# Patient Record
Sex: Female | Born: 1950
Health system: Southern US, Community
[De-identification: ages and names within clinical notes are randomized; demographics above are authoritative.]

## PROBLEM LIST (undated history)

## (undated) DIAGNOSIS — K219 Gastro-esophageal reflux disease without esophagitis: Secondary | ICD-10-CM

## (undated) DIAGNOSIS — R0600 Dyspnea, unspecified: Secondary | ICD-10-CM

## (undated) DIAGNOSIS — I499 Cardiac arrhythmia, unspecified: Secondary | ICD-10-CM

## (undated) DIAGNOSIS — I4891 Unspecified atrial fibrillation: Secondary | ICD-10-CM

## (undated) DIAGNOSIS — R609 Edema, unspecified: Secondary | ICD-10-CM

## (undated) DIAGNOSIS — I82409 Acute embolism and thrombosis of unspecified deep veins of unspecified lower extremity: Secondary | ICD-10-CM

## (undated) DIAGNOSIS — I10 Essential (primary) hypertension: Secondary | ICD-10-CM

## (undated) DIAGNOSIS — E039 Hypothyroidism, unspecified: Secondary | ICD-10-CM

## (undated) DIAGNOSIS — E785 Hyperlipidemia, unspecified: Secondary | ICD-10-CM

## (undated) DIAGNOSIS — M1990 Unspecified osteoarthritis, unspecified site: Secondary | ICD-10-CM

## (undated) DIAGNOSIS — I8390 Asymptomatic varicose veins of unspecified lower extremity: Secondary | ICD-10-CM

## (undated) HISTORY — PX: TONSILLECTOMY: SUR1361

## (undated) HISTORY — DX: Hypothyroidism, unspecified: E03.9

## (undated) HISTORY — PX: CYSTOCELE REPAIR: SHX163

## (undated) HISTORY — DX: Acute embolism and thrombosis of unspecified deep veins of unspecified lower extremity: I82.409

## (undated) HISTORY — PX: HERNIA REPAIR: SHX51

## (undated) HISTORY — PX: VARICOSE VEIN SURGERY: SHX832

## (undated) HISTORY — PX: CHOLECYSTECTOMY: SHX55

## (undated) HISTORY — DX: Asymptomatic varicose veins of unspecified lower extremity: I83.90

## (undated) HISTORY — DX: Essential (primary) hypertension: I10

## (undated) HISTORY — DX: Hyperlipidemia, unspecified: E78.5

## (undated) HISTORY — DX: Edema, unspecified: R60.9

---

## 1976-05-15 HISTORY — PX: APPENDECTOMY: SHX54

## 2010-12-07 NOTE — Progress Notes (Signed)
[facility] [place]  Referred by: Juleen China, MD  Reason for referral: Swollen L leg  History of Present Illness  Candice Perez is a 60 y.o. female who presents with chief complaint: swollen leg.  Patient notes, onset of swelling 12 months ago, associated with pain.  The swelling increases as the day continues.  She has Left > right pain the calf.  The swelling improves with compression stockings and elevating her leg.  She has to stand for her occupation.  The patient has had no history of DVT,  history of varicose vein, no history of venous stasis ulcers, no history of  Lymphedema and history of skin changes in lower legs.  Her father had history of varicose veins.  The patient has used compression stockings in the past.  The patient has had three children.    Past Medical History  Diagnosis Date  . Hypothyroidism   . Edema   . Hyperlipidemia   . Hypertension     no meds now    Past Surgical History  Procedure Date  . Tonsillectomy   . Cystocele repair   . Cholecystectomy     Laparoscopic    History   Social History  . Marital Status: Married    Spouse Name: N/A    Number of Children: N/A  . Years of Education: N/A   Occupational History  . Not on file.   Social History Main Topics  . Smoking status: Former Smoker -- 40 years    Types: Cigarettes    Quit date: 09/17/2010  . Smokeless tobacco: Not on file  . Alcohol Use: No  . Drug Use: No  . Sexually Active:    Other Topics Concern  . Not on file   Social History Narrative  . No narrative on file    Family History  Problem Relation Age of Onset  . Cancer Father   . Other Father     Varicose veins, Arteriosclerosis    Current outpatient prescriptions:Multiple Vitamin (MULTIVITAMIN) tablet, Take 1 tablet by mouth daily.  , Disp: , Rfl: ;  amoxicillin (AMOXIL) 500 MG capsule, , Disp: , Rfl: ;  buPROPion (WELLBUTRIN XL) 150 MG 24 hr tablet, , Disp: , Rfl: ;  CHERATUSSIN AC 100-10  MG/5ML syrup, , Disp: , Rfl: ;  clarithromycin (BIAXIN XL) 500 MG 24 hr tablet, , Disp: , Rfl: ;  clarithromycin (BIAXIN) 500 MG tablet, , Disp: , Rfl:  ibuprofen (ADVIL,MOTRIN) 600 MG tablet, , Disp: , Rfl: ;  levofloxacin (LEVAQUIN) 750 MG tablet, , Disp: , Rfl: ;  levothyroxine (SYNTHROID, LEVOTHROID) 75 MCG tablet, Take 75 mcg by mouth daily. , Disp: , Rfl: ;  omeprazole (PRILOSEC) 20 MG capsule, , Disp: , Rfl: ;  oxyCODONE (OXY IR/ROXICODONE) 5 MG immediate release tablet, , Disp: , Rfl: ;  predniSONE (DELTASONE) 20 MG tablet, , Disp: , Rfl:  PROAIR HFA 108 (90 BASE) MCG/ACT inhaler, , Disp: , Rfl:   Allergies as of 12/09/2010  . (No Known Allergies)   Review of Systems (Positive items in bold and italic, otherwise negative)  General: Weight loss, Weight gain, Loss of appetite, Fever  Neurologic: Dizziness, Blackouts, Headaches, Seizure  Ear/Nose/Throat: Change in eyesight, Change in hearing, Nose bleeds, Sore throat  Vascular: Pain in legs with walking, Pain in feet while lying flat, Non-healing ulcer, Stroke, "Mini stroke", Slurred speech, Temporary blindness, Blood clot in vein, Phlebitis  Pulmonary: Home oxygen, Productive cough, Bronchitis, Coughing up blood, Asthma, Wheezing  Musculoskeletal: Arthritis, Joint pain, Muscle pain  Cardiac: Chest pain, Chest tightness/pressure, Shortness of breath when lying flat, Palpitations, Heart murmur, Arrythmia, Atrial fibrillation  Hematologic: Bleeding problems, Clotting disorder, Anemia  Psychiatric:  Depression, Anxiety, Attention deficit disorder  Gastrointestinal:  Black stool, Blood in stool, Peptic ulcer disease, Reflux, Hiatal hernia, Trouble swallowing, Diarrhea, Constipation  Urinary:  Kidney disease, Burning with urination, Frequent urination, Difficulty urinating  Skin: Ulcers, Rashes  Physical Examination  Filed Vitals:   12/09/10 1542  BP: 184/80  Pulse: 56   General: A&O x 3, WDWN, obese  Head:  [place]/AT  Ear/Nose/Throat: Hearing grossly intact, nares w/o erythema or drainage, oropharynx w/o Erythema/Exudate  Eyes: PERRLA, EOMI  Neck: Supple, no nuchal rigidity, no palpable LAD  Pulmonary: Sym exp, good air movt, CTAB, no rales, rhonchi, & wheezing  Cardiac: RRR, Nl S1, S2, no Murmurs, rubs or gallops  Vascular: Vessel Right Left  Radial Palpable Palpable  [place] Palpable Palpable  Brachial Palpable Palpable  Carotid Palpable, without bruit Palpable, without bruit  Aorta Non-palpable N/A  Femoral Palpable Palpable  Popliteal Non-palpable Non-palpable  PT Palpable Palpable  DP Palpable Palpable   Gastrointestinal: soft, NTND, -G/R, - HSM, - masses, - CVAT B, obese  Musculoskeletal: M/S 5/5 throughout, Extremities without ischemic changes, Bilateral edema 1+ (L>R), spider veins B (R>L), lipodermatosclerosis B (R>L)   Neurologic: CN 2-12 intact, Pain and light touch intact in extremities, Motor exam as listed above  Psychiatric: Judgment intact, Mood & affect appropriate for pt's clinical situation  Dermatologic: See M/S exam for extremity exam, no rashes otherwise noted  Lymph : No Cervical, Axillary, or Inguinal lymphadenopathy   Non-Invasive Vascular Imaging   BLE Venous Insufficiency Duplex ordered  Outside Studies/Documentation 15 pages of outside documents were reviewed including: venous duplex report.  Medical Decision Making  Candice Perez is a 60 y.o. female who presents with: BLE chronic venous insufficiency.   Based on the patient's history and examination, I recommend: BLE venous insufficiency duplex, which I will arrange for, and continue compression stocking use.  By report, she has been told to wear compression stockings since May 2012.  I suspect she will have evidence of venous reflux consistent with C4 CVI, hence, she may potentially benefit from EVLA of the GSV.  Once her studies are completed, she will be referred to the [facility] for  evaluation for intervention.Thank you for allowing [place] to participate in this patient's care.  Leonides Sake, MD [facility] [place] Office: [phone] Pager: [phone]

## 2010-12-09 ENCOUNTER — Encounter: Payer: Self-pay | Admitting: Vascular Surgery

## 2010-12-09 ENCOUNTER — Ambulatory Visit (INDEPENDENT_AMBULATORY_CARE_PROVIDER_SITE_OTHER): Payer: BC Managed Care – PPO | Admitting: Vascular Surgery

## 2010-12-09 VITALS — BP 184/80 | HR 56 | Ht 67.0 in | Wt 218.0 lb

## 2010-12-09 DIAGNOSIS — I872 Venous insufficiency (chronic) (peripheral): Secondary | ICD-10-CM | POA: Insufficient documentation

## 2010-12-09 NOTE — Progress Notes (Signed)
BLE edema      L>R   No injury    Ref--> Dr. Juleen China

## 2011-01-05 ENCOUNTER — Encounter: Payer: Self-pay | Admitting: Vascular Surgery

## 2011-01-10 ENCOUNTER — Encounter: Payer: Self-pay | Admitting: Vascular Surgery

## 2011-01-27 ENCOUNTER — Encounter: Payer: Self-pay | Admitting: Vascular Surgery

## 2011-01-30 ENCOUNTER — Encounter: Payer: Self-pay | Admitting: Vascular Surgery

## 2011-01-30 ENCOUNTER — Ambulatory Visit (INDEPENDENT_AMBULATORY_CARE_PROVIDER_SITE_OTHER): Payer: BC Managed Care – PPO | Admitting: Vascular Surgery

## 2011-01-30 ENCOUNTER — Other Ambulatory Visit (INDEPENDENT_AMBULATORY_CARE_PROVIDER_SITE_OTHER): Payer: BC Managed Care – PPO | Admitting: *Deleted

## 2011-01-30 VITALS — BP 185/78 | HR 60 | Resp 20 | Ht 67.0 in | Wt 290.0 lb

## 2011-01-30 DIAGNOSIS — I87309 Chronic venous hypertension (idiopathic) without complications of unspecified lower extremity: Secondary | ICD-10-CM

## 2011-01-30 DIAGNOSIS — I83893 Varicose veins of bilateral lower extremities with other complications: Secondary | ICD-10-CM

## 2011-01-30 NOTE — Progress Notes (Signed)
Subjective:     Patient ID: Candice Perez, female   DOB: [birth year], 60 y.o.   MRN: [id number]  HPI this 60 year old female returns for followup of her venous insufficiency of the left leg she was evaluated by Dr. Imogene Burn in July of this year. She has been wearing short-leg elastic compression stockings for 5 months with no improvement in her symptomatology. The swelling has been present in the left leg for 1 year no history of DVT superficial thrombophlebitis stasis ulcers or bleeding. She returns today continuing to have increasing symptoms of swelling in the left leg as the day progresses. She is unable to elevate her leg during the day because of her occupation. Past Medical History  Diagnosis Date  . Hypothyroidism   . Edema   . Hyperlipidemia   . Hypertension     no meds now    History  Substance Use Topics  . Smoking status: Former Smoker -- 40 years    Types: Cigarettes    Quit date: 09/17/2010  . Smokeless tobacco: Never Used  . Alcohol Use: No    Family History  Problem Relation Age of Onset  . Cancer Father   . Other Father     Varicose veins, Arteriosclerosis    Not on File  Current outpatient prescriptions:buPROPion (WELLBUTRIN XL) 150 MG 24 hr tablet, , Disp: , Rfl: ;  levothyroxine (SYNTHROID, LEVOTHROID) 75 MCG tablet, Take 75 mcg by mouth daily. , Disp: , Rfl: ;  Multiple Vitamin (MULTIVITAMIN) tablet, Take 1 tablet by mouth daily.  , Disp: , Rfl: ;  omeprazole (PRILOSEC) 20 MG capsule, , Disp: , Rfl: ;  amoxicillin (AMOXIL) 500 MG capsule, , Disp: , Rfl: ;  CHERATUSSIN AC 100-10 MG/5ML syrup, , Disp: , Rfl:  clarithromycin (BIAXIN XL) 500 MG 24 hr tablet, , Disp: , Rfl: ;  clarithromycin (BIAXIN) 500 MG tablet, , Disp: , Rfl: ;  ibuprofen (ADVIL,MOTRIN) 600 MG tablet, , Disp: , Rfl: ;  levofloxacin (LEVAQUIN) 750 MG tablet, , Disp: , Rfl: ;  oxyCODONE (OXY IR/ROXICODONE) 5 MG immediate release tablet, , Disp: , Rfl: ;  predniSONE (DELTASONE) 20 MG tablet, , Disp: ,  Rfl: ;  PROAIR HFA 108 (90 BASE) MCG/ACT inhaler, , Disp: , Rfl:   BP 185/78  Pulse 60  Resp 20  Ht 5\' 7"  (1.702 m)  Wt 290 lb (131.543 kg)  BMI 45.42 kg/m2  Body mass index is 45.42 kg/(m^2).        Review of Systems denies chest pain dyspnea on exertion PND orthopnea hemoptysis or other pulmonary symptoms all other systems are negative the complete review of systems     Objective:   Physical Exam blood pressure 180/78 heart rate 60 respirations 20 Gen. she is alert and oriented female in no apparent stress HEENT exam normal for age Chest clear to auscultation no rhonchi or wheezing Lower extremity exam she has 1-2+ edema in the left leg from the midthigh distally just to 3+ dorsalis pedis and posterior tibial pulses bilaterally. No spider veins or reticular veins are noted. No hyperpigmentation or ulceration is noted.  They ordered a venous duplex exam which are reviewed interpreted. She has gross reflux in the left great saphenous vein down to the knee level very mild reflux in the left deep venous system.    Assessment:    venous insufficiency of left leg do to valvular incompetence and reflux left great saphenous system with resulting edema and symptoms    Plan:    #  1 change to long leg elastic compression stockings 20 mm-30 mm gradient #2 elevation of legs as much as possible although not possible during the day her occupation will not allow #3 ibuprofen when necessary for pain #4 return to see me in 3 months there's been no improvement I think she would be a good candidate for laser ablation left great saphenous vein from the knee the saphenofemoral junction

## 2011-02-08 NOTE — Procedures (Unsigned)
LOWER EXTREMITY VENOUS REFLUX EXAM  INDICATION:  Chronic venous hypertension without complication.  EXAM:  Using color-flow imaging and pulse Doppler spectral analysis, the left common femoral, superficial femoral, popliteal, posterior tibial, greater and lesser saphenous veins are evaluated.  There is evidence suggesting deep venous reflux of >500 milliseconds noted in the superficial femoral and popliteal veins.  The left saphenofemoral junction is competent. The left nontortuous GSV demonstrates reflux of >500 milliseconds.  The left proximal small saphenous vein demonstrates competency.  GSV Diameter (used if found to be incompetent only)                                           Right    Left Proximal Greater Saphenous Vein           cm       0.66 cm Proximal-to-mid-thigh                     cm       cm Mid thigh                                 cm       0.68 cm Mid-distal thigh                          cm       cm Distal thigh                              cm       0.66 cm Knee                                      cm       0.44 cm  IMPRESSION:  Left great saphenous, superficial femoral, and popliteal vein reflux of >550milliseconds noted, as described above.  ___________________________________________ Quita Skye. Hart Rochester, M.D.  CH/MEDQ  D:  01/31/2011  T:  01/31/2011  Job:  [id number]

## 2011-02-14 ENCOUNTER — Encounter: Payer: BC Managed Care – PPO | Admitting: Vascular Surgery

## 2011-02-15 ENCOUNTER — Encounter: Payer: BC Managed Care – PPO | Admitting: Vascular Surgery

## 2011-04-28 ENCOUNTER — Encounter: Payer: Self-pay | Admitting: Vascular Surgery

## 2011-05-01 ENCOUNTER — Encounter: Payer: Self-pay | Admitting: Vascular Surgery

## 2011-05-01 ENCOUNTER — Ambulatory Visit (INDEPENDENT_AMBULATORY_CARE_PROVIDER_SITE_OTHER): Payer: BC Managed Care – PPO | Admitting: Vascular Surgery

## 2011-05-01 VITALS — BP 187/84 | HR 66 | Resp 20 | Ht 67.0 in | Wt 290.0 lb

## 2011-05-01 DIAGNOSIS — I83893 Varicose veins of bilateral lower extremities with other complications: Secondary | ICD-10-CM | POA: Insufficient documentation

## 2011-05-01 NOTE — Progress Notes (Signed)
Subjective:     Patient ID: Candice Perez, female   DOB: 1951/05/04, 60 y.o.   MRN: [id number]  HPI this 60 year old female returns today for further evaluation for venous insufficiency of the left leg. She has been having increasing edema in the left leg over the past 12 months which has caused aching throbbing and burning discomfort which worsens as the day progresses. She works at [employer] and is on her feet all day. She works short leg stockings for 6 months and now has 1 long-leg elastic compression stockings (20-30 mm gradient) with no improvement in her symptomatology. She has no history of DVT, thrombophlebitis, stasis ulcers, or bleeding. She has no symptoms in the right leg. She has tried ibuprofen but is unable to elevate her leg during work.  Past Medical History  Diagnosis Date  . Hypothyroidism   . Edema   . Hyperlipidemia   . Hypertension     no meds now    History  Substance Use Topics  . Smoking status: Former Smoker -- 40 years    Types: Cigarettes    Quit date: 09/17/2010  . Smokeless tobacco: Never Used  . Alcohol Use: No    Family History  Problem Relation Age of Onset  . Cancer Father   . Other Father     Varicose veins, Arteriosclerosis    No Known Allergies  Current outpatient prescriptions:ibuprofen (ADVIL,MOTRIN) 600 MG tablet, , Disp: , Rfl: ;  levothyroxine (SYNTHROID, LEVOTHROID) 75 MCG tablet, Take 75 mcg by mouth daily. , Disp: , Rfl: ;  amoxicillin (AMOXIL) 500 MG capsule, , Disp: , Rfl: ;  buPROPion (WELLBUTRIN XL) 150 MG 24 hr tablet, , Disp: , Rfl: ;  CHERATUSSIN AC 100-10 MG/5ML syrup, , Disp: , Rfl: ;  clarithromycin (BIAXIN XL) 500 MG 24 hr tablet, , Disp: , Rfl:  clarithromycin (BIAXIN) 500 MG tablet, , Disp: , Rfl: ;  levofloxacin (LEVAQUIN) 750 MG tablet, , Disp: , Rfl: ;  Multiple Vitamin (MULTIVITAMIN) tablet, Take 1 tablet by mouth daily.  , Disp: , Rfl: ;  omeprazole (PRILOSEC) 20 MG capsule, , Disp: , Rfl: ;  oxyCODONE (OXY  IR/ROXICODONE) 5 MG immediate release tablet, , Disp: , Rfl: ;  predniSONE (DELTASONE) 20 MG tablet, , Disp: , Rfl: ;  PROAIR HFA 108 (90 BASE) MCG/ACT inhaler, , Disp: , Rfl:   BP 187/84  Pulse 66  Resp 20  Ht 5\' 7"  (1.702 m)  Wt 290 lb (131.543 kg)  BMI 45.42 kg/m2  Body mass index is 45.42 kg/(m^2).            Review of Systems denies chest pain, dyspnea on exertion, PND, hemoptysis, claudication, or rest pain in her lower extremities.     Objective:   Physical Exam blood pressure 187/84 heart rate 66 respirations 20 Generally she is well-developed well-nourished female in no apparent distress alert and oriented x3 Lungs no rhonchi or wheezing Lower extremity exam reveals 3+ femoral and dorsalis pedis pulses palpable bilaterally. Both feet are well-perfused. There is 1+ edema in the left leg with some diffuse reticular and spider veins. No stasis ulcers are noted. Right leg is unremarkable.  Venous duplex exam performed in September of this year in our vascular lab reveals gross reflux of the left great saphenous vein throughout with mild reflux in the deep system.    Assessment:     Venous insufficiency left leg secondary without or incontinence left great saphenous vein with reflux causing edema and pain-resistant  to conservative therapy-affecting her ability to work and her daily living    Plan:    patient needs a laser ablation left great saphenous vein for relief of swelling and symptoms. Will proceed with precertification to perform this in the near future

## 2011-05-03 ENCOUNTER — Other Ambulatory Visit: Payer: Self-pay | Admitting: *Deleted

## 2011-05-03 DIAGNOSIS — I83893 Varicose veins of bilateral lower extremities with other complications: Secondary | ICD-10-CM

## 2011-05-19 ENCOUNTER — Encounter: Payer: Self-pay | Admitting: Vascular Surgery

## 2011-05-22 ENCOUNTER — Ambulatory Visit (INDEPENDENT_AMBULATORY_CARE_PROVIDER_SITE_OTHER): Payer: BC Managed Care – PPO | Admitting: Vascular Surgery

## 2011-05-22 ENCOUNTER — Encounter: Payer: Self-pay | Admitting: Vascular Surgery

## 2011-05-22 VITALS — BP 187/157 | HR 55 | Resp 18 | Ht 67.0 in | Wt 290.0 lb

## 2011-05-22 DIAGNOSIS — I83893 Varicose veins of bilateral lower extremities with other complications: Secondary | ICD-10-CM

## 2011-05-22 NOTE — Progress Notes (Signed)
Laser Ablation Procedure      Date: 05/22/2011  Yes  Candice Perez DOB:1951/04/11  Consent signed:   Surgeon:J.D. Hart Rochester  Procedure: Laser Ablation: left Greater Saphenous Vein  BP 187/157  Pulse 55  Resp 18  Ht 5\' 7"  (1.702 m)  Wt 290 lb (131.543 kg)  BMI 45.42 kg/m2  Start time:9:10   End time: 9:50  Tumescent Anesthesia: 200 cc 0.9% NaCl with 50 cc Lidocaine HCL with 1% Epi and 15 cc 8.4% NaHCO3  Local Anesthesia: 4 cc Lidocaine HCL and NaHCO3 (ratio 2:1)  Patient tolerated procedure well: Yes   Description of Procedure:  After marking the course of the saphenous vein and the secondary varicosities in the standing position, the patient was placed on the operating table in the supine position, and the left leg was prepped and draped in sterile fashion. Local anesthetic was administered, and under ultrasound guidance the saphenous vein was accessed with a micro needle and guide wire; then the micro puncture sheath was placed. A guide wire was inserted to the saphenofemoral junction, followed by a 5 french sheath.  The position of the sheath and then the laser fiber below the junction was confirmed using the ultrasound and visualization of the aiming beam.  Tumescent anesthesia was administered along the course of the saphenous vein using ultrasound guidance. Protective laser glasses were placed on the patient, and the laser was fired at 15 watt pulsed mode advancing 1-2 mm per sec.  For a total of 1637 joules.  A steri strip was applied to the puncture site.   ABD pads and thigh high compression stockings were applied.  Ace wrap bandages were applied over the phlebectomy sites and at the top of the saphenofemoral junction.  Blood loss was less than 15 cc.  The patient ambulated out of the operating room having tolerated the procedure well.

## 2011-05-22 NOTE — Progress Notes (Signed)
Subjective:     Patient ID: Candice Perez, female   DOB: 01-02-1951, 62 y.o.   MRN: [id number]  HPI 61 year old female had laser ablation of her left great saphenous vein for gross reflux in the left great saphenous system with secondary swelling and pain in the left lower extremity 1835 J of energy was utilized. This was done from the knee to the saphenofemoral junction. She tolerated the procedure well with no complaints.   Review of Systems     Objective:   Physical ExamBP 187/157  Pulse 55  Resp 18  Ht 5\' 7"  (1.702 m)  Wt 290 lb (131.543 kg)  BMI 45.42 kg/m2     Assessment:     Successful laser ablation left great saphenous vein well follow    Plan:     Return in one week for venous duplex exam to confirm closure left great saphenous vein. Patient does have a prominent anterior accessory branch which did not have reflux and was not closed with laser ablation

## 2011-05-23 ENCOUNTER — Telehealth: Payer: Self-pay | Admitting: *Deleted

## 2011-05-23 ENCOUNTER — Encounter: Payer: Self-pay | Admitting: Vascular Surgery

## 2011-05-23 NOTE — Telephone Encounter (Signed)
05/23/2011  Time: 9:27 AM   Patient Name: Candice Perez  Patient of: J.D. Hart Rochester  Procedure:Laser Ablation left GSV  Reached patient at home and checked  Her status  Yes    Comments/Actions Taken: Patient doing well. No problems. Following all instructions.    @SIGNATURE @

## 2011-05-29 ENCOUNTER — Encounter: Payer: Self-pay | Admitting: Vascular Surgery

## 2011-05-30 ENCOUNTER — Ambulatory Visit (INDEPENDENT_AMBULATORY_CARE_PROVIDER_SITE_OTHER): Payer: BC Managed Care – PPO | Admitting: Vascular Surgery

## 2011-05-30 ENCOUNTER — Encounter: Payer: Self-pay | Admitting: Vascular Surgery

## 2011-05-30 VITALS — BP 188/80 | HR 58 | Resp 20 | Ht 67.0 in | Wt 280.0 lb

## 2011-05-30 DIAGNOSIS — I82402 Acute embolism and thrombosis of unspecified deep veins of left lower extremity: Secondary | ICD-10-CM

## 2011-05-30 DIAGNOSIS — I83893 Varicose veins of bilateral lower extremities with other complications: Secondary | ICD-10-CM

## 2011-05-30 NOTE — Progress Notes (Signed)
Performed post EVLA venous duplex on 05/30/2011 @ [facility]

## 2011-05-30 NOTE — Progress Notes (Signed)
Subjective:     Patient ID: Candice Perez, female   DOB: June 02, 1950, 61 y.o.   MRN: [id number]  HPI this 61 year old female had laser ablation left great saphenous vein performed one week ago for chronic edema and pain in the left lower extremity due to to reflux in the left great saphenous system. He has had some mild discomfort in the left thigh at the ablation site. She has had no change in the distal edema. She is wearing her long leg elastic compression stockings and taking ibuprofen as instructed. She has had no shortness of breath or hemoptysis or chest pain.  Past Medical History  Diagnosis Date  . Hypothyroidism   . Edema   . Hyperlipidemia   . Hypertension     no meds now    History  Substance Use Topics  . Smoking status: Former Smoker -- 40 years    Types: Cigarettes    Quit date: 09/17/2010  . Smokeless tobacco: Never Used  . Alcohol Use: No    Family History  Problem Relation Age of Onset  . Cancer Father   . Other Father     Varicose veins, Arteriosclerosis    No Known Allergies  Current outpatient prescriptions:amoxicillin (AMOXIL) 500 MG capsule, , Disp: , Rfl: ;  buPROPion (WELLBUTRIN XL) 150 MG 24 hr tablet, , Disp: , Rfl: ;  CHERATUSSIN AC 100-10 MG/5ML syrup, , Disp: , Rfl: ;  clarithromycin (BIAXIN XL) 500 MG 24 hr tablet, , Disp: , Rfl: ;  clarithromycin (BIAXIN) 500 MG tablet, , Disp: , Rfl: ;  ibuprofen (ADVIL,MOTRIN) 600 MG tablet, , Disp: , Rfl: ;  levofloxacin (LEVAQUIN) 750 MG tablet, , Disp: , Rfl:  levothyroxine (SYNTHROID, LEVOTHROID) 75 MCG tablet, Take 75 mcg by mouth daily. , Disp: , Rfl: ;  Multiple Vitamin (MULTIVITAMIN) tablet, Take 1 tablet by mouth daily.  , Disp: , Rfl: ;  omeprazole (PRILOSEC) 20 MG capsule, , Disp: , Rfl: ;  oxyCODONE (OXY IR/ROXICODONE) 5 MG immediate release tablet, , Disp: , Rfl: ;  predniSONE (DELTASONE) 20 MG tablet, , Disp: , Rfl: ;  PROAIR HFA 108 (90 BASE) MCG/ACT inhaler, , Disp: , Rfl:   BP 188/80  Pulse 58   Resp 20  Ht 5\' 7"  (1.702 m)  Wt 280 lb (127.007 kg)  BMI 43.85 kg/m2  Body mass index is 43.85 kg/(m^2).         Review of Systems denies chest pain, dyspnea on exertion, PND, orthopnea, hemoptysis, claudication     Objective:   Physical Exam blood pressure 180/80 heart rate 88 respirations 20 General well-developed well-nourished female in no apparent distress alert and oriented x3 Left lower extremity mild tenderness along the course of the great saphenous vein from the distal thigh to the saphenofemoral junction. 1+ edema distally. No calf tenderness.  Today I ordered a venous duplex exam which I reviewed and interpreted. The great saphenous vein is totally closed up to the saphenofemoral junction from the distal thigh. There are some chronic changes in the distal superficial femoral vein to the popliteal vein possibly representing an old partial DVT. These findings were present on the study dated September 2012.    Assessment:     Good result following laser ablation left great saphenous vein for venous hypertension and swelling    Plan:     Will continue elevation of legs at night and convert to short leg elastic compression stocking on a chronic basis because of old partial DVT in  popliteal vein Return to see [place] on when necessary basis

## 2011-05-31 ENCOUNTER — Telehealth: Payer: Self-pay | Admitting: *Deleted

## 2011-05-31 NOTE — Telephone Encounter (Signed)
Mrs. Candice Perez had called in with concerns about the partially occluded deep vein in the back of her knee. I explained again that this finding in the ultrasound that was performed yesterday is not a new finding. As Dr. Hart Rochester explained, it was there over 3 months ago when she had the initial duplex reflux study. This area had nothing to do with the GSV that was ablated with the laser. That treated vein is closed just like we hoped. I told her I would be happy to send any medical records if she would just sign the medical release for that I will put into the mail. I will also include the return to work form and the request to have a parking place closer to her building. I told her I did not know who to suggest that she go to for a second opinion. I did suggest that she follow Dr. Candie Chroman instructions of taking 81mg  of Asp each day and that she could return to [place] in 3 months for another ultrasound to see if there were any changes to the area she is concerned about. She seemed reassured and said that sounded like a good idea rather than going somewhere else. I suggested that she can call back in 6 weeks to schedule that lab study and an appointment with Dr. Hart Rochester.

## 2011-05-31 NOTE — Telephone Encounter (Signed)
Responded to voice message left by Cory Roughen on 05-31-2011 at 11:17AM.  Mrs. Starn states she needs the following : 1. Note from [facility] stating she is released to go back to work on 06-05-2010.  2.  Note from [facility] to her employer stating that she needs to be able to park closer to the building where she works due to increased swelling in her legs. 3. Referral from Dr. Hart Rochester to another vascular surgeon to get a second opinion on her treated leg.  4. Copies of her medical records from [facility].  Read back this list to Mrs. Yankey and verified it with her.  Informed Mrs. Bui I would give the list of her requests to Clementeen Hoof R.N. today, and Marisue Ivan would call her back today (05-31-2011). Klein Willcox, Neena Rhymes

## 2011-06-14 NOTE — Procedures (Unsigned)
DUPLEX DEEP VENOUS EXAM - LOWER EXTREMITY  INDICATION:  Varicose veins, post endovenous laser ablation on 05/22/2011.  HISTORY:  Edema:  Yes. Trauma/Surgery:  Left EVLA on 05/22/2011. Pain:  Yes. PE:  No. Previous DVT:  No. Anticoagulants:  No. Other:  DUPLEX EXAM:               CFV   SFV   PopV   PTV   GSV               R  L  R  L  R  L   R  L  R  L Thrombosis       o     o     p      o     + Spontaneous      +     +     +      +     o Phasic           +     +     +      +     o Augmentation     +     +     D      +     o Compressible     +     +     p      +     o Competent        +     +     +      +     o  Legend:  + - yes  o - no  p - partial  D - decreased  IMPRESSION: 1. Nonoccluding  chronic deep venous thrombosis identified in the left     distal superficial femoral vein/popliteal vein. This was previously     identified 01-30-2011 on the preoperative exam. 2. The remainder of the deep venous system of the left lower extremity     appears patent and compressible. 3. A technically difficult and limited evaluation of the left lower     extremity calf veins due to patient body habitus and vessel depth. 4. Good post-ablation results the length of the great saphenous vein     ablated.   _____________________________ Quita Skye. Hart Rochester, M.D.  SH/MEDQ  D:  05/30/2011  T:  05/30/2011  Job:  [id number]

## 2013-01-02 ENCOUNTER — Telehealth: Payer: Self-pay

## 2013-01-02 ENCOUNTER — Ambulatory Visit: Payer: BC Managed Care – PPO | Admitting: Vascular Surgery

## 2013-01-02 ENCOUNTER — Encounter: Payer: Self-pay | Admitting: Vascular Surgery

## 2013-01-02 NOTE — Telephone Encounter (Signed)
Phone call from pt.  C/o "left leg pain" in posterior area, "above calf and below the knee"; states "it is hard to walk on it."  States has swelling from the ankle to the knee; relates that the swelling is not new; states has had swelling in left leg, even prior to laser ablation.  Denies any inflammation or redness of area of pain.  From last office note 05/2011, noted pt. had "old partial DVT of popliteal vein".   Denies being on any anticoagulation.  Discussed w/ Dr. Edilia Bo on 01/01/13; recommended to schedule pt. For venous duplex left LE to r/o DVT.

## 2013-01-03 ENCOUNTER — Encounter: Payer: Self-pay | Admitting: Vascular Surgery

## 2013-01-03 ENCOUNTER — Encounter (INDEPENDENT_AMBULATORY_CARE_PROVIDER_SITE_OTHER): Payer: BC Managed Care – PPO

## 2013-01-03 ENCOUNTER — Ambulatory Visit (INDEPENDENT_AMBULATORY_CARE_PROVIDER_SITE_OTHER): Payer: BC Managed Care – PPO | Admitting: Vascular Surgery

## 2013-01-03 VITALS — BP 191/96 | HR 60 | Resp 18 | Ht 67.0 in | Wt 265.0 lb

## 2013-01-03 DIAGNOSIS — I80299 Phlebitis and thrombophlebitis of other deep vessels of unspecified lower extremity: Secondary | ICD-10-CM

## 2013-01-03 DIAGNOSIS — I872 Venous insufficiency (chronic) (peripheral): Secondary | ICD-10-CM

## 2013-01-03 DIAGNOSIS — M7989 Other specified soft tissue disorders: Secondary | ICD-10-CM

## 2013-01-03 DIAGNOSIS — I82409 Acute embolism and thrombosis of unspecified deep veins of unspecified lower extremity: Secondary | ICD-10-CM

## 2013-01-03 DIAGNOSIS — I83893 Varicose veins of bilateral lower extremities with other complications: Secondary | ICD-10-CM

## 2013-01-03 DIAGNOSIS — I82402 Acute embolism and thrombosis of unspecified deep veins of left lower extremity: Secondary | ICD-10-CM

## 2013-01-03 NOTE — Progress Notes (Signed)
[facility] [place] [facility]  Established Venous Insufficiency  History of Present Illness  Candice Perez is a 62 y.o. (Sep 12, 1950) female s/p EVLA L GSV who presents with chief complaint: L knee pain.  The patient's symptoms have progressed.  The patient's symptoms are: L knee pain.  Sx are more consistent with arthritic sx.  The patient's treatment regimen currently included: maximal medical management and intermittent use of a knee high compression stocking.  The patient had a known chronic DVT in L SFV prior to the EVLA evaluation.  The patient notes her CVI sx are relatively stable.  The patient's PMH, PSH, SH, FamHx, Med, and Allergies are unchanged from 05/30/11.  On ROS today: left knee pain, no venous ulcers  Physical Examination  Filed Vitals:   01/03/13 1108  BP: 191/96  Pulse: 60  Resp: 18  Height: 5\' 7"  (1.702 m)  Weight: 265 lb (120.203 kg)   Body mass index is 41.5 kg/(m^2).  General: A&O x 3, WD, morbidly obese  Pulmonary: Sym exp, good air movt, CTAB, no rales, rhonchi, & wheezing  Cardiac: RRR, Nl S1, S2, no Murmurs, rubs or gallops  Vascular: Vessel Right Left  Radial Palpable Palpable  Brachial Palpable Palpable  Carotid Palpable, without bruit Palpable, without bruit  Aorta Not palpable due to pannus N/A  Femoral Palpable Palpable  Popliteal Not palpable Not palpable  PT Palpable Palpable  DP Palpable Palpable   Gastrointestinal: soft, NTND, -G/R, - HSM, - masses, - CVAT B  Musculoskeletal: M/S 5/5 throughout , Extremities without ischemic changes , B LDS, B extensive varicosities, no edema  Neurologic: Pain and light touch intact in extremities , Motor exam as listed above  Non-Invasive Vascular Imaging  LLE Venous Insufficiency Duplex (Date: 01/03/2013):   Chronic DVT in L SFV in the thigh and pop vein  Incompetence of L CFV, FV, poplieal veins, and SFJ   Successful ablated L GSV from mid-distal thigh   Medical Decision  Making  Candice Perez is a 63 y.o. female who presents with: B leg chronic venous insufficiency (C4), s/p L GSV EVLA  Based on the patient's vascular studies and examination, I have recommended continue compressive therapy.    With easily palpable arterial pulses and no significant change in venous system, I doubt this patient's sx are due to her venous disease.    Further orthopedic evaluation might be fruitful.  F/U prn with Dr. Hart Rochester  Thank you for allowing [place] to participate in this patient's care.  Leonides Sake, MD [facility] Office: [phone] Pager: [phone]  01/03/2013, 4:52 PM

## 2013-01-06 ENCOUNTER — Other Ambulatory Visit: Payer: Self-pay

## 2013-01-07 ENCOUNTER — Other Ambulatory Visit: Payer: Self-pay | Admitting: *Deleted

## 2013-01-07 DIAGNOSIS — M7989 Other specified soft tissue disorders: Secondary | ICD-10-CM

## 2013-03-20 ENCOUNTER — Other Ambulatory Visit: Payer: Self-pay

## 2013-08-13 ENCOUNTER — Encounter (HOSPITAL_COMMUNITY): Payer: Self-pay | Admitting: Pharmacy Technician

## 2013-08-20 ENCOUNTER — Encounter (HOSPITAL_COMMUNITY)
Admission: RE | Admit: 2013-08-20 | Discharge: 2013-08-20 | Disposition: A | Discharge status: Home / Self Care | Payer: Medicare Other | Source: Ambulatory Visit | Attending: Orthopedic Surgery | Admitting: Orthopedic Surgery

## 2013-08-20 ENCOUNTER — Encounter (HOSPITAL_COMMUNITY): Payer: Self-pay

## 2013-08-20 DIAGNOSIS — Z01812 Encounter for preprocedural laboratory examination: Secondary | ICD-10-CM | POA: Insufficient documentation

## 2013-08-20 HISTORY — DX: Gastro-esophageal reflux disease without esophagitis: K21.9

## 2013-08-20 HISTORY — DX: Unspecified osteoarthritis, unspecified site: M19.90

## 2013-08-20 LAB — SURGICAL PCR SCREEN
MRSA, PCR: NEGATIVE
Staphylococcus aureus: POSITIVE — AB

## 2013-08-20 LAB — PROTIME-INR
INR: 1.1 (ref 0.00–1.49)
PROTHROMBIN TIME: 14 s (ref 11.6–15.2)

## 2013-08-20 LAB — URINALYSIS, ROUTINE W REFLEX MICROSCOPIC
Bilirubin Urine: NEGATIVE
Glucose, UA: NEGATIVE mg/dL
Ketones, ur: NEGATIVE mg/dL
NITRITE: POSITIVE — AB
Protein, ur: NEGATIVE mg/dL
Specific Gravity, Urine: 1.008 (ref 1.005–1.030)
UROBILINOGEN UA: 0.2 mg/dL (ref 0.0–1.0)
pH: 6 (ref 5.0–8.0)

## 2013-08-20 LAB — URINE MICROSCOPIC-ADD ON

## 2013-08-20 LAB — ABO/RH: ABO/RH(D): A NEG

## 2013-08-20 LAB — APTT: aPTT: 28 seconds (ref 24–37)

## 2013-08-20 NOTE — Progress Notes (Signed)
CBC / CMET (DONE 08/05/13)  / EKG ( DONE 12/20/12 ) on chart done at [facility]

## 2013-08-20 NOTE — Progress Notes (Signed)
Abnormal UA faxed to Dr.Olin 

## 2013-08-20 NOTE — Patient Instructions (Addendum)
Candice Perez  08/20/2013                           YOUR PROCEDURE IS SCHEDULED ON: 08/26/13               PLEASE REPORT TO SHORT STAY CENTER AT : 1:30 PM               CALL THIS NUMBER IF ANY PROBLEMS THE DAY OF SURGERY :               [phone]                                REMEMBER:   Do not eat food  AFTER MIDNIGHT   May have clear liquids UNTIL 6 HOURS BEFORE SURGERY ( 10:30 AM)               Take these medicines the morning of surgery with A SIP OF WATER: LEVOTHYROXINE   Do not wear jewelry, make-up   Do not wear lotions, powders, or perfumes.   Do not shave legs or underarms 12 hrs. before surgery (men may shave face)  Do not bring valuables to the hospital.  Contacts, dentures or bridgework may not be worn into surgery.  Leave suitcase in the car. After surgery it may be brought to your room.  For patients admitted to the hospital more than one night, checkout time is            11:00 AM                                                       The day of discharge.   Patients discharged the day of surgery will not be allowed to drive home.            If going home same day of surgery, must have someone stay with you              FIRST 24 hrs at home and arrange for some one to drive you              home from hospital.    Special Instructions             Please read over the following fact sheets that you were given:               1. [place]                2. INCENTIVE SPIROMETER               3. MRSA INFORMATION SHEET                                                X_____________________________________________________________________        Failure to follow these instructions may result in cancellation of your surgery

## 2013-08-21 NOTE — Progress Notes (Signed)
Rx Mupuricin called to [name] [phone] Pt notified

## 2013-08-24 NOTE — H&P (Signed)
TOTAL KNEE ADMISSION H&P  Patient is being admitted for left total knee arthroplasty.  Subjective:  Chief Complaint:    Left knee OA / AVN / pain.  HPI: Candice Perez, 63 y.o. female, has a history of pain and functional disability in the left knee due to arthritis and has failed non-surgical conservative treatments for greater than 12 weeks to include NSAID's and/or analgesics, corticosteriod injections, use of assistive devices and activity modification.  Onset of symptoms was gradual, starting years ago with gradually worsening course since that time.  She has been seen and evaluated through Dr. Theda Sers' office with progressive dysfunction in her left knee. Radiographs revealed some concerns for avascular lesions in the medial femoral condyle. MRI had been ordered. She's here to discuss the best form of treatment at this point. She requires assistance for mobilization at times. She did not get any relief with the previous nonsurgical management. She's also had an Economy brace to use.   She does have a lot of tenderness over the medial side of the knee. Her knee ligaments are stable. There is pseudolaxity medially. She has full knee extension and pain with flexion. Her right knee is normal for comparative purposes.Advanced degenerative changes of the left knee with an osteochondral defect vs. a small avascular lesion noted on the distal medial femoral condyle. This was confirmed by MRI with advanced degenerative changes noted as well as meniscal pathology. After reviewing with her the plain x-rays, it is readily evident that the only option available for her at this point would be to have a total knee arthroplasty. Any arthroscopic surgery would not alleviate all the pathology present. The MRI provides helpful information for the extent of bone involvement for preoperative planning.fter reviewing with [name] these findings, we, at this point, will proceed with total knee  arthroplasty on the left. Risks of infection, DVT, component failure, need for future surgery were discussed and reviewed. The postop. course and expectations regarding the post op. pain level were also reviewed.  D/C Plans:   Home with HHPT/SNF  Post-op Meds:     No Rx given  Tranexamic Acid:   Not to be given - DVT  Decadron:     To be given  FYI:    Xarelto post-op  Norco post-op   Patient Active Problem List   Diagnosis Date Noted  . Deep vein thrombosis of left lower extremity 01/03/2013  . Varicose veins of lower extremities with other complications 66/44/0347  . Chronic venous insufficiency 12/09/2010   Past Medical History  Diagnosis Date  . Hypothyroidism   . Edema   . Hyperlipidemia   . Hypertension     no meds now  . Varicose veins   . DVT (deep venous thrombosis)   . Arthritis   . GERD (gastroesophageal reflux disease)     OCCASIONAL - PRILOSEC IF NEEDED    Past Surgical History  Procedure Laterality Date  . Tonsillectomy    . Cystocele repair    . Cholecystectomy      Laparoscopic  . Varicose vein surgery    . Hernia repair      No prescriptions prior to admission   No Known Allergies   History  Substance Use Topics  . Smoking status: Former Smoker -- 40 years    Types: Cigarettes    Quit date: 09/17/2010  . Smokeless tobacco: Never Used  . Alcohol Use: No    Family History  Problem Relation Age of Onset  . Cancer  Father   . Other Father     Varicose veins, Arteriosclerosis     Review of Systems  Constitutional: Negative.   HENT: Negative.   Eyes: Negative.   Respiratory: Negative.   Cardiovascular: Negative.   Gastrointestinal: Positive for heartburn.  Genitourinary: Negative.   Musculoskeletal: Positive for joint pain.  Skin: Negative.   Neurological: Negative.   Endo/Heme/Allergies: Negative.   Psychiatric/Behavioral: Negative.     Objective:  Physical Exam  Constitutional: She is oriented to person, place, and time. She  appears well-developed and well-nourished.  HENT:  Head: Normocephalic and atraumatic.  Mouth/Throat: Oropharynx is clear and moist.  Eyes: Pupils are equal, round, and reactive to light.  Neck: Neck supple. No JVD present. No tracheal deviation present. No thyromegaly present.  Cardiovascular: Normal rate, regular rhythm and normal heart sounds.   Respiratory: Effort normal and breath sounds normal. No stridor. No respiratory distress. She has no wheezes.  GI: Soft. There is no tenderness. There is no guarding.  Musculoskeletal:       Left knee: She exhibits decreased range of motion, swelling and bony tenderness. She exhibits no ecchymosis, no deformity, no laceration and no erythema. Tenderness found. Medial joint line tenderness noted. No lateral joint line tenderness noted.  Lymphadenopathy:    She has no cervical adenopathy.  Neurological: She is alert and oriented to person, place, and time.  Skin: Skin is warm and dry.  Psychiatric: She has a normal mood and affect.     Labs:  Estimated body mass index is 41.50 kg/(m^2) as calculated from the following:   Height as of 01/03/13: 5\' 7"  (1.702 m).   Weight as of 01/03/13: 120.203 kg (265 lb).   Imaging Review Plain radiographs demonstrate severe degenerative joint disease of the left knee(s). The overall alignment isneutral. The bone quality appears to be good for age and reported activity level.  Assessment/Plan:  End stage arthritis, left knee   The patient history, physical examination, clinical judgment of the provider and imaging studies are consistent with end stage degenerative joint disease of the left knee(s) and total knee arthroplasty is deemed medically necessary. The treatment options including medical management, injection therapy arthroscopy and arthroplasty were discussed at length. The risks and benefits of total knee arthroplasty were presented and reviewed. The risks due to aseptic loosening, infection,  stiffness, patella tracking problems, thromboembolic complications and other imponderables were discussed. The patient acknowledged the explanation, agreed to proceed with the plan and consent was signed. Patient is being admitted for inpatient treatment for surgery, pain control, PT, OT, prophylactic antibiotics, VTE prophylaxis, progressive ambulation and ADL's and discharge planning. The patient is planning to be discharged to skilled nursing facility / home.    [place] Pugh Tanish Sinkler   PAC  08/24/2013, 12:59 PM

## 2013-08-25 MED ORDER — CEFAZOLIN SODIUM 10 G IJ SOLR
3.0000 g | INTRAMUSCULAR | Status: AC
  Administered 2013-08-26: 1 g via INTRAVENOUS
  Administered 2013-08-26: 2 g via INTRAVENOUS
  Filled 2013-08-25: qty 3000

## 2013-08-25 NOTE — Progress Notes (Signed)
Pt called and made aware surgery time changed to 1555 p, clear liquids midnight until 1000 am, then npo, arrive 1300 pm [facility] short stay 08-26-13

## 2013-08-26 ENCOUNTER — Inpatient Hospital Stay (HOSPITAL_COMMUNITY)
Admission: RE | Admit: 2013-08-26 | Discharge: 2013-08-28 | DRG: 470 | Disposition: A | Discharge status: Home Health | Payer: Medicare Other | Source: Ambulatory Visit | Attending: Orthopedic Surgery | Admitting: Orthopedic Surgery

## 2013-08-26 ENCOUNTER — Inpatient Hospital Stay (HOSPITAL_COMMUNITY): Payer: Medicare Other | Admitting: Anesthesiology

## 2013-08-26 ENCOUNTER — Encounter (HOSPITAL_COMMUNITY)
Admission: RE | Disposition: A | Discharge status: Home Health | Payer: Self-pay | Source: Ambulatory Visit | Attending: Orthopedic Surgery

## 2013-08-26 ENCOUNTER — Encounter (HOSPITAL_COMMUNITY): Payer: Medicare Other | Admitting: Anesthesiology

## 2013-08-26 ENCOUNTER — Encounter (HOSPITAL_COMMUNITY): Payer: Self-pay | Admitting: *Deleted

## 2013-08-26 DIAGNOSIS — D500 Iron deficiency anemia secondary to blood loss (chronic): Secondary | ICD-10-CM | POA: Diagnosis not present

## 2013-08-26 DIAGNOSIS — I83893 Varicose veins of bilateral lower extremities with other complications: Secondary | ICD-10-CM | POA: Diagnosis present

## 2013-08-26 DIAGNOSIS — K219 Gastro-esophageal reflux disease without esophagitis: Secondary | ICD-10-CM | POA: Diagnosis present

## 2013-08-26 DIAGNOSIS — I872 Venous insufficiency (chronic) (peripheral): Secondary | ICD-10-CM | POA: Diagnosis present

## 2013-08-26 DIAGNOSIS — Z96659 Presence of unspecified artificial knee joint: Secondary | ICD-10-CM

## 2013-08-26 DIAGNOSIS — E785 Hyperlipidemia, unspecified: Secondary | ICD-10-CM | POA: Diagnosis present

## 2013-08-26 DIAGNOSIS — M6580 Other synovitis and tenosynovitis, unspecified site: Secondary | ICD-10-CM | POA: Diagnosis present

## 2013-08-26 DIAGNOSIS — Z6841 Body Mass Index (BMI) 40.0 and over, adult: Secondary | ICD-10-CM

## 2013-08-26 DIAGNOSIS — E039 Hypothyroidism, unspecified: Secondary | ICD-10-CM | POA: Diagnosis present

## 2013-08-26 DIAGNOSIS — I10 Essential (primary) hypertension: Secondary | ICD-10-CM | POA: Diagnosis present

## 2013-08-26 DIAGNOSIS — M1710 Unilateral primary osteoarthritis, unspecified knee: Principal | ICD-10-CM | POA: Diagnosis present

## 2013-08-26 DIAGNOSIS — Z01812 Encounter for preprocedural laboratory examination: Secondary | ICD-10-CM

## 2013-08-26 DIAGNOSIS — Z86718 Personal history of other venous thrombosis and embolism: Secondary | ICD-10-CM

## 2013-08-26 DIAGNOSIS — Z87891 Personal history of nicotine dependence: Secondary | ICD-10-CM

## 2013-08-26 DIAGNOSIS — D62 Acute posthemorrhagic anemia: Secondary | ICD-10-CM | POA: Diagnosis not present

## 2013-08-26 HISTORY — PX: TOTAL KNEE ARTHROPLASTY: SHX125

## 2013-08-26 LAB — TYPE AND SCREEN
ABO/RH(D): A NEG
Antibody Screen: NEGATIVE

## 2013-08-26 SURGERY — ARTHROPLASTY, KNEE, TOTAL
Anesthesia: Spinal | Site: Knee | Laterality: Left

## 2013-08-26 MED ORDER — PHENOL 1.4 % MT LIQD: 1.0000 | OROMUCOSAL | Status: DC | PRN

## 2013-08-26 MED ORDER — SODIUM CHLORIDE 0.9 % IJ SOLN
INTRAMUSCULAR | Status: DC | PRN
  Administered 2013-08-26: 14 mL

## 2013-08-26 MED ORDER — ONDANSETRON HCL 4 MG/2ML IJ SOLN: 4.0000 mg | Freq: Four times a day (QID) | INTRAMUSCULAR | Status: DC | PRN

## 2013-08-26 MED ORDER — ONDANSETRON HCL 4 MG/2ML IJ SOLN
INTRAMUSCULAR | Status: AC
  Filled 2013-08-26: qty 2

## 2013-08-26 MED ORDER — FERROUS SULFATE 325 (65 FE) MG PO TABS
325.0000 mg | ORAL_TABLET | Freq: Three times a day (TID) | ORAL | Status: DC
  Administered 2013-08-27 – 2013-08-28 (×4): 325 mg via ORAL
  Filled 2013-08-26 (×7): qty 1

## 2013-08-26 MED ORDER — DOCUSATE SODIUM 100 MG PO CAPS
100.0000 mg | ORAL_CAPSULE | Freq: Two times a day (BID) | ORAL | Status: DC
  Administered 2013-08-26 – 2013-08-28 (×4): 100 mg via ORAL

## 2013-08-26 MED ORDER — KETOROLAC TROMETHAMINE 30 MG/ML IJ SOLN
INTRAMUSCULAR | Status: DC | PRN
  Administered 2013-08-26: 30 mg

## 2013-08-26 MED ORDER — HYDROMORPHONE HCL PF 1 MG/ML IJ SOLN: 0.5000 mg | INTRAMUSCULAR | Status: DC | PRN

## 2013-08-26 MED ORDER — ALUM & MAG HYDROXIDE-SIMETH 200-200-20 MG/5ML PO SUSP: 30.0000 mL | ORAL | Status: DC | PRN

## 2013-08-26 MED ORDER — DEXTROSE 5 % IV SOLN
500.0000 mg | Freq: Four times a day (QID) | INTRAVENOUS | Status: DC | PRN
  Filled 2013-08-26: qty 5

## 2013-08-26 MED ORDER — METOCLOPRAMIDE HCL 10 MG PO TABS: 5.0000 mg | ORAL_TABLET | Freq: Three times a day (TID) | ORAL | Status: DC | PRN

## 2013-08-26 MED ORDER — LIDOCAINE HCL (CARDIAC) 20 MG/ML IV SOLN
INTRAVENOUS | Status: AC
  Filled 2013-08-26: qty 5

## 2013-08-26 MED ORDER — KETOROLAC TROMETHAMINE 30 MG/ML IJ SOLN
INTRAMUSCULAR | Status: AC
  Filled 2013-08-26: qty 1

## 2013-08-26 MED ORDER — ONDANSETRON HCL 4 MG PO TABS: 4.0000 mg | ORAL_TABLET | Freq: Four times a day (QID) | ORAL | Status: DC | PRN

## 2013-08-26 MED ORDER — BUPIVACAINE LIPOSOME 1.3 % IJ SUSP
INTRAMUSCULAR | Status: DC | PRN
  Administered 2013-08-26: 20 mL

## 2013-08-26 MED ORDER — CEFAZOLIN SODIUM-DEXTROSE 2-3 GM-% IV SOLR
2.0000 g | Freq: Four times a day (QID) | INTRAVENOUS | Status: AC
  Administered 2013-08-27 (×2): 2 g via INTRAVENOUS
  Filled 2013-08-26 (×2): qty 50

## 2013-08-26 MED ORDER — METOCLOPRAMIDE HCL 5 MG/ML IJ SOLN: 5.0000 mg | Freq: Three times a day (TID) | INTRAMUSCULAR | Status: DC | PRN

## 2013-08-26 MED ORDER — PROPOFOL 10 MG/ML IV BOLUS
INTRAVENOUS | Status: AC
  Filled 2013-08-26: qty 20

## 2013-08-26 MED ORDER — SODIUM CHLORIDE 0.9 % IV SOLN
INTRAVENOUS | Status: DC
  Administered 2013-08-27: 02:00:00 via INTRAVENOUS
  Filled 2013-08-26 (×5): qty 1000

## 2013-08-26 MED ORDER — KETAMINE HCL 10 MG/ML IJ SOLN
INTRAMUSCULAR | Status: AC
  Filled 2013-08-26: qty 1

## 2013-08-26 MED ORDER — DEXAMETHASONE SODIUM PHOSPHATE 10 MG/ML IJ SOLN
10.0000 mg | Freq: Once | INTRAMUSCULAR | Status: AC
  Administered 2013-08-26: 10 mg via INTRAVENOUS

## 2013-08-26 MED ORDER — BISACODYL 10 MG RE SUPP: 10.0000 mg | Freq: Every day | RECTAL | Status: DC | PRN

## 2013-08-26 MED ORDER — CEFAZOLIN SODIUM-DEXTROSE 2-3 GM-% IV SOLR
INTRAVENOUS | Status: AC
  Filled 2013-08-26: qty 50

## 2013-08-26 MED ORDER — HYDROCHLOROTHIAZIDE 25 MG PO TABS
25.0000 mg | ORAL_TABLET | Freq: Every day | ORAL | Status: DC
  Administered 2013-08-27 – 2013-08-28 (×2): 25 mg via ORAL
  Filled 2013-08-26 (×2): qty 1

## 2013-08-26 MED ORDER — DEXAMETHASONE SODIUM PHOSPHATE 10 MG/ML IJ SOLN
INTRAMUSCULAR | Status: AC
  Filled 2013-08-26: qty 1

## 2013-08-26 MED ORDER — HYDROMORPHONE HCL PF 2 MG/ML IJ SOLN
INTRAMUSCULAR | Status: AC
  Filled 2013-08-26: qty 1

## 2013-08-26 MED ORDER — SODIUM CHLORIDE 0.9 % IR SOLN
Status: DC | PRN
  Administered 2013-08-26: 3000 mL

## 2013-08-26 MED ORDER — LACTATED RINGERS IV SOLN
INTRAVENOUS | Status: DC | PRN
Start: 2013-08-26 — End: 2013-08-26
  Administered 2013-08-26 (×2): via INTRAVENOUS

## 2013-08-26 MED ORDER — KETAMINE HCL 10 MG/ML IJ SOLN
INTRAMUSCULAR | Status: DC | PRN
  Administered 2013-08-26 (×3): 5 mg via INTRAVENOUS
  Administered 2013-08-26: 10 mg via INTRAVENOUS

## 2013-08-26 MED ORDER — DEXAMETHASONE SODIUM PHOSPHATE 10 MG/ML IJ SOLN
10.0000 mg | Freq: Once | INTRAMUSCULAR | Status: AC
  Administered 2013-08-27: 10 mg via INTRAVENOUS
  Filled 2013-08-26: qty 1

## 2013-08-26 MED ORDER — SODIUM CHLORIDE 0.9 % IJ SOLN
INTRAMUSCULAR | Status: AC
  Filled 2013-08-26: qty 10

## 2013-08-26 MED ORDER — FENTANYL CITRATE 0.05 MG/ML IJ SOLN
INTRAMUSCULAR | Status: AC
  Filled 2013-08-26: qty 2

## 2013-08-26 MED ORDER — BUPIVACAINE-EPINEPHRINE PF 0.25-1:200000 % IJ SOLN
INTRAMUSCULAR | Status: AC
  Filled 2013-08-26: qty 30

## 2013-08-26 MED ORDER — SODIUM CHLORIDE 0.9 % IJ SOLN
INTRAMUSCULAR | Status: AC
  Filled 2013-08-26: qty 50

## 2013-08-26 MED ORDER — MAGNESIUM CITRATE PO SOLN: 1.0000 | Freq: Once | ORAL | Status: AC | PRN

## 2013-08-26 MED ORDER — POLYETHYLENE GLYCOL 3350 17 G PO PACK
17.0000 g | PACK | Freq: Two times a day (BID) | ORAL | Status: DC
  Administered 2013-08-26 – 2013-08-28 (×4): 17 g via ORAL

## 2013-08-26 MED ORDER — HYDROMORPHONE HCL PF 1 MG/ML IJ SOLN: 0.2500 mg | INTRAMUSCULAR | Status: DC | PRN

## 2013-08-26 MED ORDER — DIPHENHYDRAMINE HCL 25 MG PO CAPS: 25.0000 mg | ORAL_CAPSULE | Freq: Four times a day (QID) | ORAL | Status: DC | PRN

## 2013-08-26 MED ORDER — LACTATED RINGERS IV SOLN: INTRAVENOUS | Status: DC

## 2013-08-26 MED ORDER — PHENYLEPHRINE HCL 10 MG/ML IJ SOLN
INTRAMUSCULAR | Status: AC
  Filled 2013-08-26: qty 1

## 2013-08-26 MED ORDER — PROPOFOL INFUSION 10 MG/ML OPTIME
INTRAVENOUS | Status: DC | PRN
  Administered 2013-08-26: 50 ug/kg/min via INTRAVENOUS

## 2013-08-26 MED ORDER — EPHEDRINE SULFATE 50 MG/ML IJ SOLN
INTRAMUSCULAR | Status: DC | PRN
  Administered 2013-08-26: 7 mg via INTRAVENOUS
  Administered 2013-08-26: 10 mg via INTRAVENOUS

## 2013-08-26 MED ORDER — MIDAZOLAM HCL 2 MG/2ML IJ SOLN
INTRAMUSCULAR | Status: AC
  Filled 2013-08-26: qty 2

## 2013-08-26 MED ORDER — CELECOXIB 200 MG PO CAPS
200.0000 mg | ORAL_CAPSULE | Freq: Two times a day (BID) | ORAL | Status: DC
  Administered 2013-08-26 – 2013-08-28 (×4): 200 mg via ORAL
  Filled 2013-08-26 (×5): qty 1

## 2013-08-26 MED ORDER — FENTANYL CITRATE 0.05 MG/ML IJ SOLN
INTRAMUSCULAR | Status: DC | PRN
  Administered 2013-08-26 (×2): 50 ug via INTRAVENOUS

## 2013-08-26 MED ORDER — EPHEDRINE SULFATE 50 MG/ML IJ SOLN
INTRAMUSCULAR | Status: AC
  Filled 2013-08-26: qty 1

## 2013-08-26 MED ORDER — BUPIVACAINE-EPINEPHRINE (PF) 0.25% -1:200000 IJ SOLN
INTRAMUSCULAR | Status: DC | PRN
  Administered 2013-08-26: 25 mL

## 2013-08-26 MED ORDER — BUPIVACAINE LIPOSOME 1.3 % IJ SUSP
20.0000 mL | Freq: Once | INTRAMUSCULAR | Status: DC
  Filled 2013-08-26: qty 20

## 2013-08-26 MED ORDER — HYDROCODONE-ACETAMINOPHEN 7.5-325 MG PO TABS
1.0000 | ORAL_TABLET | ORAL | Status: DC
  Administered 2013-08-26 – 2013-08-28 (×7): 1 via ORAL
  Filled 2013-08-26: qty 2
  Filled 2013-08-26 (×2): qty 1
  Filled 2013-08-26: qty 2
  Filled 2013-08-26 (×3): qty 1

## 2013-08-26 MED ORDER — 0.9 % SODIUM CHLORIDE (POUR BTL) OPTIME
TOPICAL | Status: DC | PRN
  Administered 2013-08-26: 1000 mL

## 2013-08-26 MED ORDER — LOSARTAN POTASSIUM-HCTZ 100-25 MG PO TABS: 1.0000 | ORAL_TABLET | Freq: Every morning | ORAL | Status: DC

## 2013-08-26 MED ORDER — PROMETHAZINE HCL 25 MG/ML IJ SOLN: 6.2500 mg | INTRAMUSCULAR | Status: DC | PRN

## 2013-08-26 MED ORDER — OXYCODONE HCL 5 MG/5ML PO SOLN
5.0000 mg | Freq: Once | ORAL | Status: DC | PRN
  Filled 2013-08-26: qty 5

## 2013-08-26 MED ORDER — MIDAZOLAM HCL 5 MG/5ML IJ SOLN
INTRAMUSCULAR | Status: DC | PRN
  Administered 2013-08-26: 2 mg via INTRAVENOUS

## 2013-08-26 MED ORDER — LOSARTAN POTASSIUM 50 MG PO TABS
100.0000 mg | ORAL_TABLET | Freq: Every day | ORAL | Status: DC
  Administered 2013-08-27 – 2013-08-28 (×2): 100 mg via ORAL
  Filled 2013-08-26 (×2): qty 2

## 2013-08-26 MED ORDER — OXYCODONE HCL 5 MG PO TABS: 5.0000 mg | ORAL_TABLET | Freq: Once | ORAL | Status: DC | PRN

## 2013-08-26 MED ORDER — CHLORHEXIDINE GLUCONATE 4 % EX LIQD: 60.0000 mL | Freq: Once | CUTANEOUS | Status: DC

## 2013-08-26 MED ORDER — MEPERIDINE HCL 50 MG/ML IJ SOLN: 6.2500 mg | INTRAMUSCULAR | Status: DC | PRN

## 2013-08-26 MED ORDER — MENTHOL 3 MG MT LOZG: 1.0000 | LOZENGE | OROMUCOSAL | Status: DC | PRN

## 2013-08-26 MED ORDER — HYDROMORPHONE HCL PF 1 MG/ML IJ SOLN
INTRAMUSCULAR | Status: DC | PRN
  Administered 2013-08-26: 2 mg via INTRAVENOUS

## 2013-08-26 MED ORDER — LEVOTHYROXINE SODIUM 100 MCG PO TABS
100.0000 ug | ORAL_TABLET | Freq: Every day | ORAL | Status: DC
  Administered 2013-08-27 – 2013-08-28 (×2): 100 ug via ORAL
  Filled 2013-08-26 (×3): qty 1

## 2013-08-26 MED ORDER — RIVAROXABAN 10 MG PO TABS
10.0000 mg | ORAL_TABLET | Freq: Every day | ORAL | Status: DC
  Administered 2013-08-27 – 2013-08-28 (×2): 10 mg via ORAL
  Filled 2013-08-26 (×4): qty 1

## 2013-08-26 MED ORDER — METHOCARBAMOL 500 MG PO TABS: 500.0000 mg | ORAL_TABLET | Freq: Four times a day (QID) | ORAL | Status: DC | PRN

## 2013-08-26 SURGICAL SUPPLY — 63 items
BAG ZIPLOCK 12X15 (MISCELLANEOUS) ×4 IMPLANT
BANDAGE ELASTIC 6 VELCRO ST LF (GAUZE/BANDAGES/DRESSINGS) ×2 IMPLANT
BANDAGE ESMARK 6X9 LF (GAUZE/BANDAGES/DRESSINGS) ×1 IMPLANT
BLADE SAW SGTL 13.0X1.19X90.0M (BLADE) ×2 IMPLANT
BNDG ESMARK 6X9 LF (GAUZE/BANDAGES/DRESSINGS) ×2
BOWL SMART MIX CTS (DISPOSABLE) ×2 IMPLANT
CEMENT HV SMART SET (Cement) ×4 IMPLANT
CUFF TOURN SGL QUICK 34 (TOURNIQUET CUFF)
CUFF TRNQT CYL 34X4X40X1 (TOURNIQUET CUFF) IMPLANT
DECANTER SPIKE VIAL GLASS SM (MISCELLANEOUS) ×2 IMPLANT
DERMABOND ADVANCED (GAUZE/BANDAGES/DRESSINGS) ×1
DERMABOND ADVANCED .7 DNX12 (GAUZE/BANDAGES/DRESSINGS) ×1 IMPLANT
DRAPE EXTREMITY T 121X128X90 (DRAPE) ×2 IMPLANT
DRAPE POUCH INSTRU U-SHP 10X18 (DRAPES) ×2 IMPLANT
DRAPE U-SHAPE 47X51 STRL (DRAPES) ×2 IMPLANT
DRSG AQUACEL AG ADV 3.5X10 (GAUZE/BANDAGES/DRESSINGS) ×2 IMPLANT
DRSG TEGADERM 4X4.75 (GAUZE/BANDAGES/DRESSINGS) IMPLANT
DURAPREP 26ML APPLICATOR (WOUND CARE) ×4 IMPLANT
ELECT REM PT RETURN 9FT ADLT (ELECTROSURGICAL) ×2
ELECTRODE REM PT RTRN 9FT ADLT (ELECTROSURGICAL) ×1 IMPLANT
EVACUATOR 1/8 PVC DRAIN (DRAIN) IMPLANT
FACESHIELD WRAPAROUND (MASK) IMPLANT
FEMUR SIGMA PS KNEE SZ 4.0N L (Femur) ×2 IMPLANT
GAUZE SPONGE 2X2 8PLY STRL LF (GAUZE/BANDAGES/DRESSINGS) IMPLANT
GLOVE BIOGEL PI IND STRL 7.0 (GLOVE) ×2 IMPLANT
GLOVE BIOGEL PI IND STRL 7.5 (GLOVE) ×1 IMPLANT
GLOVE BIOGEL PI IND STRL 8 (GLOVE) ×1 IMPLANT
GLOVE BIOGEL PI INDICATOR 7.0 (GLOVE) ×2
GLOVE BIOGEL PI INDICATOR 7.5 (GLOVE) ×1
GLOVE BIOGEL PI INDICATOR 8 (GLOVE) ×1
GLOVE ECLIPSE 8.0 STRL XLNG CF (GLOVE) ×2 IMPLANT
GLOVE ORTHO TXT STRL SZ7.5 (GLOVE) ×4 IMPLANT
GLOVE SURG SS PI 6.5 STRL IVOR (GLOVE) ×6 IMPLANT
GOWN SPEC L3 XXLG W/TWL (GOWN DISPOSABLE) ×2 IMPLANT
GOWN STRL REUS W/TWL LRG LVL3 (GOWN DISPOSABLE) ×2 IMPLANT
HANDPIECE INTERPULSE COAX TIP (DISPOSABLE) ×1
HOOD PEEL AWAY FACE SHEILD DIS (HOOD) ×4 IMPLANT
KIT BASIN OR (CUSTOM PROCEDURE TRAY) ×2 IMPLANT
MANIFOLD NEPTUNE II (INSTRUMENTS) ×2 IMPLANT
NDL SAFETY ECLIPSE 18X1.5 (NEEDLE) ×1 IMPLANT
NEEDLE HYPO 18GX1.5 SHARP (NEEDLE) ×1
NS IRRIG 1000ML POUR BTL (IV SOLUTION) ×2 IMPLANT
PACK TOTAL JOINT (CUSTOM PROCEDURE TRAY) ×2 IMPLANT
PATELLA DOME PFC 41MM (Knees) ×2 IMPLANT
PLATE ROT INSERT 10MM SIZE 4 (Plate) ×2 IMPLANT
POSITIONER SURGICAL ARM (MISCELLANEOUS) ×2 IMPLANT
SET HNDPC FAN SPRY TIP SCT (DISPOSABLE) ×1 IMPLANT
SET PAD KNEE POSITIONER (MISCELLANEOUS) ×2 IMPLANT
SPONGE GAUZE 2X2 STER 10/PKG (GAUZE/BANDAGES/DRESSINGS)
SUCTION FRAZIER 12FR DISP (SUCTIONS) ×2 IMPLANT
SUT MNCRL AB 4-0 PS2 18 (SUTURE) ×2 IMPLANT
SUT VIC AB 1 CT1 36 (SUTURE) ×2 IMPLANT
SUT VIC AB 2-0 CT1 27 (SUTURE) ×3
SUT VIC AB 2-0 CT1 TAPERPNT 27 (SUTURE) ×3 IMPLANT
SUT VLOC 180 0 24IN GS25 (SUTURE) ×2 IMPLANT
SYR 50ML LL SCALE MARK (SYRINGE) ×2 IMPLANT
TOWEL OR 17X26 10 PK STRL BLUE (TOWEL DISPOSABLE) ×2 IMPLANT
TOWEL OR NON WOVEN STRL DISP B (DISPOSABLE) IMPLANT
TRAY FOLEY CATH 14FRSI W/METER (CATHETERS) ×2 IMPLANT
TRAY REVISION SZ 4 (Knees) ×2 IMPLANT
WATER STERILE IRR 1500ML POUR (IV SOLUTION) ×2 IMPLANT
WEDGE SIZE 4 5MM (Knees) ×4 IMPLANT
WRAP KNEE MAXI GEL POST OP (GAUZE/BANDAGES/DRESSINGS) ×2 IMPLANT

## 2013-08-26 NOTE — Anesthesia Preprocedure Evaluation (Signed)
Anesthesia Evaluation  Patient identified by MRN, date of birth, ID band Patient awake    Reviewed: Allergy & Precautions, H&P , NPO status , Patient's Chart, lab work & pertinent test results  Airway Mallampati: II TM Distance: >3 FB Neck ROM: Full    Dental  (+) Dental Advisory Given   Pulmonary neg pulmonary ROS, former smoker,  breath sounds clear to auscultation        Cardiovascular hypertension, Pt. on medications + Peripheral Vascular Disease and DVT Rhythm:Regular Rate:Normal     Neuro/Psych negative neurological ROS  negative psych ROS   GI/Hepatic negative GI ROS, Neg liver ROS, GERD-  ,  Endo/Other  negative endocrine ROSHypothyroidism Morbid obesity  Renal/GU negative Renal ROS  negative genitourinary   Musculoskeletal negative musculoskeletal ROS (+)   Abdominal (+) + obese,   Peds negative pediatric ROS (+)  Hematology negative hematology ROS (+)   Anesthesia Other Findings   Reproductive/Obstetrics negative OB ROS                           Anesthesia Physical Anesthesia Plan  ASA: III  Anesthesia Plan:    Post-op Pain Management:    Induction:   Airway Management Planned:   Additional Equipment:   Intra-op Plan:   Post-operative Plan:   Informed Consent: I have reviewed the patients History and Physical, chart, labs and discussed the procedure including the risks, benefits and alternatives for the proposed anesthesia with the patient or authorized representative who has indicated his/her understanding and acceptance.   Dental advisory given  Plan Discussed with: CRNA  Anesthesia Plan Comments:         Anesthesia Quick Evaluation

## 2013-08-26 NOTE — Progress Notes (Addendum)
Wedding band will not come off even with soap and water. Taped to finger. Patient informed of dangers. Instructed to have removed professionally and enlarged.  1520:  Patient and husband informed surgery will be delayed 2-3 hours due to emergency with one of Dr Aurea Graff patients. Will keep family updated. They verbalize understanding.

## 2013-08-26 NOTE — Anesthesia Procedure Notes (Addendum)
Spinal  Patient location during procedure: OR Start time: 08/26/2013 8:08 PM End time: 08/26/2013 8:11 PM Staffing Anesthesiologist: Nolon Nations R Performed by: anesthesiologist  Preanesthetic Checklist Completed: patient identified, site marked, surgical consent, pre-op evaluation, timeout performed, IV checked, risks and benefits discussed and monitors and equipment checked Spinal Block Patient position: sitting Prep: ChloraPrep Patient monitoring: heart rate, continuous pulse ox and blood pressure Approach: right paramedian Location: L3-4 Injection technique: single-shot Needle Needle type: Quincke  Needle gauge: 22 G Needle length: 9 cm Assessment Sensory level: T8 Additional Notes Expiration date of kit checked and confirmed. Patient tolerated procedure well, without complications.

## 2013-08-26 NOTE — Op Note (Signed)
NAME:  Candice Perez                      MEDICAL RECORD NO.:  [id number]                             FACILITY:  [facility]      PHYSICIAN:  Pietro Cassis. Alvan Dame, M.D.  DATE OF BIRTH:  08-Jul-1950      DATE OF PROCEDURE:  08/26/2013                                     OPERATIVE REPORT         PREOPERATIVE DIAGNOSIS:  Left knee osteoarthritis.      POSTOPERATIVE DIAGNOSIS:  Left knee osteoarthritis.      FINDINGS:  The patient was noted to have complete loss of cartilage and   bone-on-bone arthritis with associated osteophytes in all three compartments of   the knee with a significant synovitis and associated effusion.      PROCEDURE:  Left total knee replacement.      COMPONENTS USED:  DePuy rotating platform posterior stabilized knee   system, a size 4 narrow femur, 4 MBT tibial tray with 68mm medial and lateral augments, 10 mm PS insert, and 41 patellar   button.      SURGEON:  Pietro Cassis. Alvan Dame, M.D.      ASSISTANT:  Danae Orleans, PA-C.      ANESTHESIA:  Spinal.      SPECIMENS:  None.      COMPLICATION:  None.      DRAINS:  None.  EBL: 200cc      TOURNIQUET TIME:  63min at 349mmHg      The patient was stable to the recovery room.      INDICATION FOR PROCEDURE:  Candice Perez is a 63 y.o. female patient of   mine.  The patient had been seen, evaluated, and treated conservatively in the   office with medication, activity modification, and injections.  The patient had   radiographic changes of bone-on-bone arthritis with endplate sclerosis and osteophytes noted.      The patient failed conservative measures including medication, injections, and activity modification, and at this point was ready for more definitive measures.   Based on the radiographic changes and failed conservative measures, the patient   decided to proceed with total knee replacement.  Risks of infection,   DVT, component failure, need for revision surgery, postop course, and   expectations were all    discussed and reviewed.  Consent was obtained for benefit of pain   relief.      PROCEDURE IN DETAIL:  The patient was brought to the operative theater.   Once adequate anesthesia, preoperative antibiotics, 2 gm of Ancef administered, the patient was positioned supine with the left thigh tourniquet placed.  The  left lower extremity was prepped and draped in sterile fashion.  A time-   out was performed identifying the patient, planned procedure, and   extremity.      The left lower extremity was placed in the [facility] leg holder.  The leg was   exsanguinated, tourniquet elevated to 250 mmHg.  A midline incision was   made followed by median parapatellar arthrotomy.  Following initial   exposure, attention was first directed to the patella.  Precut  measurement was noted to be 26 mm.  I resected down to 14-15 mm and used a   41 patellar button to restore patellar height as well as cover the cut   surface.      The lug holes were drilled and a metal shim was placed to protect the   patella from retractors and saw blades.      At this point, attention was now directed to the femur.  The femoral   canal was opened with a drill, irrigated to try to prevent fat emboli.  An   intramedullary rod was passed at 3 degrees valgus, 8 mm of bone was   resected off the distal femur due to pre-operative hyperextension.  Following this resection, the tibia was   subluxated anteriorly.  Using the extramedullary guide, 8 mm of bone was resected off   the proximal lateral tibia.  Upon inspection of the extension gap, despite the very conservative bone cuts there was still evidence of hyperextension.  The tibia cut was confirmed to be perpendicular in the coronal plane, checking with an alignment rod.      Once this was done, I sized the femur to be a size 4 in the anterior-   posterior dimension, chose a narrow component based on medial and   lateral dimension.  The size 4 rotation block was then pinned in    position anterior referenced using the C-clamp to set rotation.  The   anterior, posterior, and  chamfer cuts were made without difficulty nor   notching making certain that I was along the anterior cortex to help   with flexion gap stability.      The final box cut was made off the lateral aspect of distal femur.      At this point, the tibia was sized to be a size 4, the size 4 tray was   then pinned in position through the medial third of the tubercle,   drilled, and keel punched, prepared for a revision type stem due her bone quality and my desire to use augments as opposed to increased polyethylene thickness.  Trial reduction was now carried with a 4N femur,  4 MBT tibial trya with augments, a 10 mm insert, and the 41 patella botton.  The knee was brought to   extension, full extension with good flexion stability with the patella   tracking through the trochlea without application of pressure.  Given   all these findings, the trial components removed.  Final components were   opened and configured on back table by myself and cement was mixed.  The knee was irrigated with normal saline   solution and pulse lavage.  The synovial lining was   then injected with 20cc of Exparel, 25cc of 0.25% Marcaine with epinephrine and 1 cc of Toradol,   total of 61 cc.      The knee was irrigated.  Final implants were then cemented onto clean and   dried cut surfaces of bone with the knee brought to extension with a 10 mm trial insert.      Once the cement had fully cured, the excess cement was removed   throughout the knee.  I confirmed I was satisfied with the range of   motion and stability, and the final 10 mm PS insert was chosen.  It was   placed into the knee.      The tourniquet had been let down at 44 minutes.  No significant  hemostasis required.  The medium Hemovac drain was placed deep.  The   extensor mechanism was then reapproximated using #1 Vicryl with the knee   in flexion.   The   remaining wound was closed with 2-0 Vicryl and running 4-0 Monocryl.   The knee was cleaned, dried, dressed sterilely using Dermabond and   Aquacel dressing.  Drain site dressed separately.  The patient was then   brought to recovery room in stable condition, tolerating the procedure   well.   Please note that Physician Assistant, Danae Orleans, was present for the entirety of the case, and was utilized for pre-operative positioning, peri-operative retractor management, general facilitation of the procedure.  He was also utilized for primary wound closure at the end of the case.              Pietro Cassis Alvan Dame, M.D.    08/26/2013 10:07 PM

## 2013-08-26 NOTE — Interval H&P Note (Signed)
History and Physical Interval Note:  08/26/2013 7:28 PM  Candice Perez  has presented today for surgery, with the diagnosis of osteoarthritis Left Knee  The various methods of treatment have been discussed with the patient and family. After consideration of risks, benefits and other options for treatment, the patient has consented to  Procedure(s): LEFT TOTAL KNEE ARTHROPLASTY (Left) as a surgical intervention .  The patient's history has been reviewed, patient examined, no change in status, stable for surgery.  I have reviewed the patient's chart and labs.  Questions were answered to the patient's satisfaction.     Mauri Pole

## 2013-08-26 NOTE — Transfer of Care (Signed)
Immediate Anesthesia Transfer of Care Note  Patient: Candice Perez  Procedure(s) Performed: Procedure(s): LEFT TOTAL KNEE ARTHROPLASTY (Left)  Patient Location: PACU  Anesthesia Type:Spinal  Level of Consciousness: awake, alert , oriented and patient cooperative  Airway & Oxygen Therapy: Patient Spontanous Breathing and Patient connected to face mask oxygen  Post-op Assessment: Report given to PACU RN and Post -op Vital signs reviewed and stable  Post vital signs: stable  Complications: No apparent anesthesia complications

## 2013-08-27 ENCOUNTER — Encounter (HOSPITAL_COMMUNITY): Payer: Self-pay | Admitting: Orthopedic Surgery

## 2013-08-27 DIAGNOSIS — D500 Iron deficiency anemia secondary to blood loss (chronic): Secondary | ICD-10-CM | POA: Diagnosis not present

## 2013-08-27 LAB — BASIC METABOLIC PANEL
BUN: 21 mg/dL (ref 6–23)
CO2: 23 meq/L (ref 19–32)
CREATININE: 0.9 mg/dL (ref 0.50–1.10)
Calcium: 8.9 mg/dL (ref 8.4–10.5)
Chloride: 98 mEq/L (ref 96–112)
GFR calc non Af Amer: 66 mL/min — ABNORMAL LOW (ref >90)
GFR, EST AFRICAN AMERICAN: 77 mL/min — AB (ref >90)
Glucose, Bld: 212 mg/dL — ABNORMAL HIGH (ref 70–99)
Potassium: 4.2 mEq/L (ref 3.7–5.3)
Sodium: 138 mEq/L (ref 137–147)

## 2013-08-27 LAB — CBC
HCT: 32.4 % — ABNORMAL LOW (ref 36.0–46.0)
Hemoglobin: 10.6 g/dL — ABNORMAL LOW (ref 12.0–15.0)
MCH: 29 pg (ref 26.0–34.0)
MCHC: 32.7 g/dL (ref 30.0–36.0)
MCV: 88.8 fL (ref 78.0–100.0)
PLATELETS: 215 10*3/uL (ref 150–400)
RBC: 3.65 MIL/uL — ABNORMAL LOW (ref 3.87–5.11)
RDW: 13.2 % (ref 11.5–15.5)
WBC: 12.7 10*3/uL — ABNORMAL HIGH (ref 4.0–10.5)

## 2013-08-27 MED ORDER — METHOCARBAMOL 500 MG PO TABS: 500.0000 mg | ORAL_TABLET | Freq: Four times a day (QID) | ORAL | Status: DC | PRN

## 2013-08-27 MED ORDER — HYDROCODONE-ACETAMINOPHEN 7.5-325 MG PO TABS: 1.0000 | ORAL_TABLET | ORAL | Status: DC | PRN

## 2013-08-27 MED ORDER — ASPIRIN EC 325 MG PO TBEC: 325.0000 mg | DELAYED_RELEASE_TABLET | Freq: Two times a day (BID) | ORAL | Status: AC

## 2013-08-27 MED ORDER — FERROUS SULFATE 325 (65 FE) MG PO TABS
325.0000 mg | ORAL_TABLET | Freq: Three times a day (TID) | ORAL | Status: DC
Start: 2013-08-27 — End: 2017-06-15

## 2013-08-27 MED ORDER — RIVAROXABAN 10 MG PO TABS: 10.0000 mg | ORAL_TABLET | Freq: Every day | ORAL | Status: DC

## 2013-08-27 MED ORDER — DSS 100 MG PO CAPS: 100.0000 mg | ORAL_CAPSULE | Freq: Two times a day (BID) | ORAL | Status: DC

## 2013-08-27 MED ORDER — POLYETHYLENE GLYCOL 3350 17 G PO PACK: 17.0000 g | PACK | Freq: Two times a day (BID) | ORAL | Status: DC

## 2013-08-27 NOTE — Evaluation (Signed)
Physical Therapy Evaluation Patient Details Name: Candice Perez MRN: [id number] DOB: 1950/05/22 Today's Date: 08/27/2013   History of Present Illness     Clinical Impression  Pt s/p L TKR presents with decreased L LE strength/ROM and post op pain limiting functional mobility.  Pt should progress to d/c home with family assist and HHPT follow up.    Follow Up Recommendations Home health PT    Equipment Recommendations  Rolling walker with 5" wheels    Recommendations for Other Services OT consult     Precautions / Restrictions Precautions Precautions: Knee;Fall Restrictions Weight Bearing Restrictions: No Other Position/Activity Restrictions: WBAT      Mobility  Bed Mobility Overal bed mobility: Needs Assistance Bed Mobility: Supine to Sit     Supine to sit: Min assist     General bed mobility comments: cues for sequence and use of R LE to self assist  Transfers Overall transfer level: Needs assistance Equipment used: Rolling walker (2 wheeled) Transfers: Sit to/from Stand Sit to Stand: Min assist         General transfer comment: cues for LE management and use of UEs to self assist  Ambulation/Gait Ambulation/Gait assistance: Min assist Ambulation Distance (Feet): 70 Feet Assistive device: Rolling walker (2 wheeled) Gait Pattern/deviations: Step-to pattern;Decreased step length - right;Decreased step length - left;Shuffle;Trunk flexed     General Gait Details: cues for sequence, posture and position from [employer]            Wheelchair Mobility    Modified Rankin (Stroke Patients Only)       Balance                                             Pertinent Vitals/Pain 4/10; premed, ice packs provided.    Home Living Family/patient expects to be discharged to:: Private residence Living Arrangements: Spouse/significant other Available Help at Discharge: Family Type of Home: Apartment Home Access: Level entry      Home Layout: One [place]: Environmental consultant - 4 wheels;Cane - single point      Prior Function Level of Independence: Independent with assistive device(s);Independent               Hand Dominance   Dominant Hand: Right    Extremity/Trunk Assessment   Upper Extremity Assessment: Overall WFL for tasks assessed           Lower Extremity Assessment: LLE deficits/detail   LLE Deficits / Details: 3-/5 quads with AAROM at knee -10 - 45  Cervical / Trunk Assessment: Normal  Communication   Communication: No difficulties  Cognition Arousal/Alertness: Awake/alert Behavior During Therapy: WFL for tasks assessed/performed Overall Cognitive Status: Within Functional Limits for tasks assessed                      General Comments      Exercises Total Joint Exercises Ankle Circles/Pumps: AROM;Both;15 reps;Supine Quad Sets: AROM;Both;10 reps;Supine Heel Slides: AROM;Left;10 reps;Supine Straight Leg Raises: AAROM;AROM;Left;10 reps;Supine      Assessment/Plan    PT Assessment Patient needs continued PT services  PT Diagnosis Difficulty walking   PT Problem List Decreased strength;Decreased range of motion;Decreased activity tolerance;Decreased mobility;Decreased knowledge of use of DME;Obesity;Pain  PT Treatment Interventions DME instruction;Gait training;Stair training;Functional mobility training;Therapeutic activities;Therapeutic exercise;Patient/family education   PT Goals (Current goals can be found in the Care Plan  section) Acute Rehab PT Goals Patient Stated Goal: Walk without pain PT Goal Formulation: With patient Time For Goal Achievement: 09/03/13 Potential to Achieve Goals: Good    Frequency 7X/week   Barriers to discharge        Co-evaluation               End of Session Equipment Utilized During Treatment: Gait belt Activity Tolerance: Patient tolerated treatment well Patient left: in chair;with call bell/phone within reach Nurse  Communication: Mobility status         Time: 0902-0935 PT Time Calculation (min): 33 min   Charges:   PT Evaluation $Initial PT Evaluation Tier I: 1 Procedure PT Treatments $Gait Training: 8-22 mins $Therapeutic Exercise: 8-22 mins   PT G Codes:          [name] 08/27/2013, 12:16 PM

## 2013-08-27 NOTE — Progress Notes (Signed)
[facility]  [facility] is providing the following services: Patient has both rw and commode at home. No dme needs at this time.  If patient discharges after hours, please call [phone].   Linward Headland 08/27/2013, 10:36 AM

## 2013-08-27 NOTE — Anesthesia Postprocedure Evaluation (Signed)
Anesthesia Post Note  Patient: Candice Perez  Procedure(s) Performed: Procedure(s) (LRB): LEFT TOTAL KNEE ARTHROPLASTY (Left)  Anesthesia type: Spinal  Patient location: PACU  Post pain: Pain level controlled  Post assessment: Post-op Vital signs reviewed  Last Vitals: BP 130/77  Pulse 65  Temp(Src) 36.4 C (Oral)  Resp 14  Ht 5\' 7"  (1.702 m)  Wt 291 lb (131.997 kg)  BMI 45.57 kg/m2  SpO2 96%  Post vital signs: Reviewed  Level of consciousness: sedated  Complications: No apparent anesthesia complications

## 2013-08-27 NOTE — Evaluation (Signed)
Occupational Therapy Evaluation Patient Details Name: Candice Perez MRN: [id number] DOB: 09/03/1950 Today's Date: 08/27/2013    History of Present Illness L TKA   Clinical Impression   Pt was admitted for the above surgery. All education was completed, and pt does not need any further OT at this time.    Follow Up Recommendations  No OT follow up    Equipment Recommendations  None recommended by OT    Recommendations for Other Services       Precautions / Restrictions Precautions Precautions: Knee;Fall Restrictions Other Position/Activity Restrictions: WBAT      Mobility Bed Mobility                  Transfers     Transfers: Sit to/from Stand Sit to Stand: Min guard         General transfer comment: cues for UE/LE position    Balance                                            ADL Overall ADL's : Needs assistance/impaired                         Toilet Transfer: Min guard;Ambulation;BSC           Functional mobility during ADLs: Min guard General ADL Comments: ambulated to bathroom with min guard.  Educated on use of reacher (which she has) for LB adls.  Husband will assist as needed.  Demonstrated  shower transfer.  Pt will either look into shower seat or sponge bathe; she has toilet riser from mother-in-law.  Pt is able to perform UB adls with set up and LB adls with min A overall     Vision                     Perception     Praxis      Pertinent Vitals/Pain 4/10 R knee; repositioned and ice applied     Hand Dominance Right   Extremity/Trunk Assessment Upper Extremity Assessment Upper Extremity Assessment: Overall WFL for tasks assessed           Communication Communication Communication: No difficulties   Cognition Arousal/Alertness: Awake/alert Behavior During Therapy: WFL for tasks assessed/performed Overall Cognitive Status: Within Functional Limits for tasks assessed                      General Comments       Exercises       Shoulder Instructions      Home Living Family/patient expects to be discharged to:: Private residence Living Arrangements: Spouse/significant other Available Help at Discharge: Family Type of Home: Apartment Home Access: Level entry     Home Layout: One level     Bathroom Shower/Tub: Occupational psychologist: Standard     Home Equipment: Toilet riser   Additional Comments: also has reacher      Prior Functioning/Environment Level of Independence: Independent with assistive device(s);Independent             OT Diagnosis:     OT Problem List:     OT Treatment/Interventions:      OT Goals(Current goals can be found in the care plan section)    OT Frequency:     Barriers to D/C:  Co-evaluation              End of Session    Activity Tolerance: Patient tolerated treatment well Patient left: in chair;with call bell/phone within reach;with family/visitor present   Time: 5449-2010 OT Time Calculation (min): 25 min Charges:  OT General Charges $OT Visit: 1 Procedure OT Evaluation $Initial OT Evaluation Tier I: 1 Procedure OT Treatments $Self Care/Home Management : 8-22 mins G-Codes:    Lesle Chris 09-08-13, 1:02 PM Lesle Chris, OTR/L [phone] September 08, 2013

## 2013-08-27 NOTE — Progress Notes (Signed)
CARE MANAGEMENT NOTE 08/27/2013  Patient:  Candice Perez, Candice Perez   Account Number:  [id number]  Date Initiated:  08/27/2013  Documentation initiated by:  DAVIS,RHONDA  Subjective/Objective Assessment:   pt had left total knee on [id number]/scheduled to be dcd to home [id number]     Action/Plan:   hhc for pt   Anticipated DC Date:  08/27/2013   Anticipated DC Plan:  [place]  In-house referral  NA      DC Planning Services  CM consult      [facility] Choice  NA   Choice offered to / List presented to:  C-1 Patient   DME arranged  [place]  3-N-1      DME agency  [place] arranged  [place].   Status of service:  Completed, signed off Medicare Important Message given?  NA - LOS <3 / Initial given by admissions (If response is "NO", the following Medicare IM given date fields will be blank) Date Medicare IM given:   Date Additional Medicare IM given:    Discharge Disposition:  [place]  Per UR Regulation:  Reviewed for med. necessity/level of care/duration of stay  If discussed at [place] of Stay Meetings, dates discussed:    Comments:  04152015/Rhonda Davis,RN,BSN,CCM:

## 2013-08-27 NOTE — Progress Notes (Signed)
   Subjective: 1 Day Post-Op Procedure(s) (LRB): LEFT TOTAL KNEE ARTHROPLASTY (Left)   Patient reports pain as mild, pain controlled. No events throughout the night. Discussed case and the procedure. Denies any CP and SOB. Ready to be discharged home if he does well with PT and pain stays controlled.   Objective:   VITALS:   Filed Vitals:   08/27/13 0607  BP: 129/70  Pulse: 91  Temp: 98.1 F (36.7 C)  Resp: 16    Neurovascular intact Dorsiflexion/Plantar flexion intact Incision: dressing C/D/I No cellulitis present Compartment soft  LABS  Recent Labs  08/27/13 0456  HGB 10.6*  HCT 32.4*  WBC 12.7*  PLT 215     Recent Labs  08/27/13 0456  NA 138  K 4.2  BUN 21  CREATININE 0.90  GLUCOSE 212*     Assessment/Plan: 1 Day Post-Op Procedure(s) (LRB): LEFT TOTAL KNEE ARTHROPLASTY (Left) Foley cath d/c'ed Advance diet Up with therapy D/C IV fluids Discharge home with home health Follow up in 2 weeks at [facility]. Follow up with OLIN,Lennart Gladish D in 2 weeks.  Contact information:  [facility] [address] [place] [phone]    Expected ABLA  Treated with iron and will observe  Morbid Obesity (BMI >40)  Estimated body mass index is 45.57 kg/(m^2) as calculated from the following:   Height as of this encounter: 5\' 7"  (1.702 m).   Weight as of this encounter: 131.997 kg (291 lb). Patient also counseled that weight may inhibit the healing process Patient counseled that losing weight will help with future health issues     West Pugh. Jeanean Hollett   PAC  08/27/2013, 9:30 AM

## 2013-08-27 NOTE — Discharge Instructions (Signed)
Information on my medicine - XARELTO® (Rivaroxaban) ° °This medication education was reviewed with me or my healthcare representative as part of my discharge preparation.  The pharmacist that spoke with me during my hospital stay was:  Kattleya Kuhnert Robert December Hedtke, RPH ° °Why was Xarelto® prescribed for you? °Xarelto® was prescribed for you to reduce the risk of blood clots forming after orthopedic surgery. The medical term for these abnormal blood clots is venous thromboembolism (VTE). ° °What do you need to know about xarelto® ? °Take your Xarelto® ONCE DAILY at the same time every day. °You may take it either with or without food. ° °If you have difficulty swallowing the tablet whole, you may crush it and mix in applesauce just prior to taking your dose. ° °Take Xarelto® exactly as prescribed by your doctor and DO NOT stop taking Xarelto® without talking to the doctor who prescribed the medication.  Stopping without other VTE prevention medication to take the place of Xarelto® may increase your risk of developing a clot. ° °After discharge, you should have regular check-up appointments with your healthcare provider that is prescribing your Xarelto®.   ° °What do you do if you miss a dose? °If you miss a dose, take it as soon as you remember on the same day then continue your regularly scheduled once daily regimen the next day. Do not take two doses of Xarelto® on the same day.  ° °Important Safety Information °A possible side effect of Xarelto® is bleeding. You should call your healthcare provider right away if you experience any of the following: °  Bleeding from an injury or your nose that does not stop. °  Unusual colored urine (red or dark brown) or unusual colored stools (red or black). °  Unusual bruising for unknown reasons. °  A serious fall or if you hit your head (even if there is no bleeding). ° °Some medicines may interact with Xarelto® and might increase your risk of bleeding while on Xarelto®. To help  avoid this, consult your healthcare provider or pharmacist prior to using any new prescription or non-prescription medications, including herbals, vitamins, non-steroidal anti-inflammatory drugs (NSAIDs) and supplements. ° °This website has more information on Xarelto®: www.xarelto.com. ° ° ° °

## 2013-08-27 NOTE — Plan of Care (Signed)
Problem: Consults Goal: Diagnosis- Total Joint Replacement Outcome: Completed/Met Date Met:  08/27/13 Primary Total Knee LEFT

## 2013-08-27 NOTE — Progress Notes (Signed)
Utilization review completed.  

## 2013-08-27 NOTE — Progress Notes (Signed)
CSW consulted for SNF placement. PN reviewed. PT is recommending HHPT following hospital d/c. RNCM will assist with d/c planning.  Candice Mccalister LCSW [phone] 

## 2013-08-27 NOTE — Progress Notes (Signed)
Physical Therapy Treatment Patient Details Name: Candice Perez MRN: [id number] DOB: 1950/06/12 Today's Date: 2013-09-26    History of Present Illness L TKA    PT Comments    Progressing well  Follow Up Recommendations  Home health PT     Equipment Recommendations  Rolling walker with 5" wheels    Recommendations for Other Services OT consult     Precautions / Restrictions Precautions Precautions: Knee;Fall Restrictions Weight Bearing Restrictions: No Other Position/Activity Restrictions: WBAT    Mobility  Bed Mobility Overal bed mobility: Needs Assistance Bed Mobility: Sit to Supine       Sit to supine: Min guard   General bed mobility comments: cues for sequence and use of R LE to self assist  Transfers Overall transfer level: Needs assistance Equipment used: Rolling walker (2 wheeled) Transfers: Sit to/from Stand Sit to Stand: Min guard         General transfer comment: cues for UE/LE position  Ambulation/Gait Ambulation/Gait assistance: Min assist Ambulation Distance (Feet): 200 Feet Assistive device: Rolling walker (2 wheeled) Gait Pattern/deviations: Step-to pattern;Decreased step length - right;Decreased step length - left;Shuffle;Trunk flexed     General Gait Details: cues for sequence, posture and position from RW; One episode balance loss posteriorly requiring min assist to correct   Stairs            Wheelchair Mobility    Modified Rankin (Stroke Patients Only)       Balance                                    Cognition Arousal/Alertness: Awake/alert Behavior During Therapy: WFL for tasks assessed/performed Overall Cognitive Status: Within Functional Limits for tasks assessed                      Exercises      General Comments        Pertinent Vitals/Pain 3/10    Home Living Family/patient expects to be discharged to:: Private residence Living Arrangements: Spouse/significant  other Available Help at Discharge: Family Type of Home: Apartment Home Access: Level entry   Home Layout: One level Home Equipment: Toilet riser Additional Comments: also has reacher    Prior Function Level of Independence: Independent with assistive device(s);Independent          PT Goals (current goals can now be found in the care plan section) Acute Rehab PT Goals Patient Stated Goal: Walk without pain PT Goal Formulation: With patient Time For Goal Achievement: 09/03/13 Potential to Achieve Goals: Good Progress towards PT goals: Progressing toward goals    Frequency  7X/week    PT Plan Current plan remains appropriate    Co-evaluation             End of Session Equipment Utilized During Treatment: Gait belt Activity Tolerance: Patient tolerated treatment well Patient left: in bed;with call bell/phone within reach     Time: 1320-1343 PT Time Calculation (min): 23 min  Charges:  $Gait Training: 23-37 mins                    G Codes:      Candice Perez 2013/09/26, 3:21 PM

## 2013-08-28 LAB — BASIC METABOLIC PANEL
BUN: 28 mg/dL — ABNORMAL HIGH (ref 6–23)
CO2: 26 mEq/L (ref 19–32)
Calcium: 9 mg/dL (ref 8.4–10.5)
Chloride: 100 mEq/L (ref 96–112)
Creatinine, Ser: 1.05 mg/dL (ref 0.50–1.10)
GFR calc Af Amer: 64 mL/min — ABNORMAL LOW (ref >90)
GFR calc non Af Amer: 55 mL/min — ABNORMAL LOW (ref >90)
GLUCOSE: 204 mg/dL — AB (ref 70–99)
POTASSIUM: 4.8 meq/L (ref 3.7–5.3)
Sodium: 138 mEq/L (ref 137–147)

## 2013-08-28 LAB — CBC
HCT: 29.4 % — ABNORMAL LOW (ref 36.0–46.0)
HEMOGLOBIN: 9.6 g/dL — AB (ref 12.0–15.0)
MCH: 29.2 pg (ref 26.0–34.0)
MCHC: 32.7 g/dL (ref 30.0–36.0)
MCV: 89.4 fL (ref 78.0–100.0)
Platelets: 193 10*3/uL (ref 150–400)
RBC: 3.29 MIL/uL — ABNORMAL LOW (ref 3.87–5.11)
RDW: 13.5 % (ref 11.5–15.5)
WBC: 12 10*3/uL — ABNORMAL HIGH (ref 4.0–10.5)

## 2013-08-28 NOTE — Progress Notes (Signed)
Utilization review completed.  

## 2013-08-28 NOTE — Progress Notes (Signed)
   Subjective: 2 Days Post-Op Procedure(s) (LRB): LEFT TOTAL KNEE REVISION (Left)   Patient reports pain as mild, pain controlled. No events throughout the night. Feels that she is doing well with PT. Ready to be discharged home.  Objective:   VITALS:   Filed Vitals:   08/28/13 0621  BP: 149/80  Pulse: 75  Temp: 98.2 F (36.8 C)  Resp: 16    Neurovascular intact Dorsiflexion/Plantar flexion intact Incision: dressing C/D/I No cellulitis present Compartment soft  LABS  Recent Labs  08/27/13 0456 08/28/13 0429  HGB 10.6* 9.6*  HCT 32.4* 29.4*  WBC 12.7* 12.0*  PLT 215 193     Recent Labs  08/27/13 0456 08/28/13 0429  NA 138 138  K 4.2 4.8  BUN 21 28*  CREATININE 0.90 1.05  GLUCOSE 212* 204*     Assessment/Plan: 2 Days Post-Op Procedure(s) (LRB): LEFT TOTAL KNEE REVISION (Left) Up with therapy Discharge home with home health Follow up in 2 weeks at [facility]. Follow up with OLIN,Joe Gee D in 2 weeks.  Contact information:  [facility] [address], [place] [phone]      Expected ABLA  Treated with iron and will observe   Morbid Obesity (BMI >40)  Estimated body mass index is 45.57 kg/(m^2) as calculated from the following:      Height as of this encounter: 5\' 7"  (1.702 m).      Weight as of this encounter: 131.997 kg (291 lb).  Patient also counseled that weight may inhibit the healing process  Patient counseled that losing weight will help with future health issues       West Pugh. Kerem Gilmer   PAC  08/28/2013, 7:35 AM

## 2013-08-28 NOTE — Progress Notes (Signed)
Physical Therapy Treatment Patient Details Name: Candice Perez MRN: [id number] DOB: January 04, 1951 Today's Date: 08/28/2013    History of Present Illness L TKA    PT Comments    POD # 2 pt eager to D/C to home today.  Pt already OOB in recliner and dressed.  Amb in hallway then performed all supine TKR TE's following handout HEP.  Pt instructed on use of ICE and freq of TE's.    Follow Up Recommendations        Equipment Recommendations   (pt stated she already has a RW)    Recommendations for Other Services       Precautions / Restrictions Precautions Precautions: Knee;Fall Restrictions Weight Bearing Restrictions: No Other Position/Activity Restrictions: WBAT    Mobility  Bed Mobility               General bed mobility comments: Pt OOB in recliner  Transfers Overall transfer level: Modified independent Equipment used: Rolling walker (2 wheeled) Transfers: Sit to/from Stand           General transfer comment: slightly impulsive, moves well  Ambulation/Gait Ambulation/Gait assistance: Supervision Ambulation Distance (Feet): 175 Feet Assistive device: Rolling walker (2 wheeled) Gait Pattern/deviations: Step-to pattern;Decreased stride length;Decreased stance time - left;Trunk flexed Gait velocity: WFL   General Gait Details: VC's to decrease gait speed to increase stance time on L LE for a more eqaul gait   Stairs            Wheelchair Mobility    Modified Rankin (Stroke Patients Only)       Balance                                    Cognition                            Exercises   Total Knee Replacement TE's 10 reps B LE ankle pumps 10 reps towel squeezes 10 reps knee presses 10 reps heel slides  10 reps SAQ's 10 reps SLR's 10 reps ABD Followed by ICE     General Comments        Pertinent Vitals/Pain C/o 3/10 pain Pre medicated ICE applied    Home Living                      Prior  Function            PT Goals (current goals can now be found in the care plan section) Progress towards PT goals: Progressing toward goals    Frequency  7X/week    PT Plan      Co-evaluation             End of Session Equipment Utilized During Treatment: Gait belt Activity Tolerance: Patient tolerated treatment well Patient left: in chair;with call bell/phone within reach     Time: 0900-0930 PT Time Calculation (min): 30 min  Charges:  $Gait Training: 8-22 mins $Therapeutic Activity: 8-22 mins                    G Codes:      Rica Koyanagi  PTA [facility]  Acute  Rehab Pager      [phone]

## 2013-09-01 NOTE — Discharge Summary (Signed)
Physician Discharge Summary  Patient ID: [name] MRN: [id number] DOB/AGE: 65-Aug-1952 65 y.o.  Admit date: 08/26/2013 Discharge date: 08/28/2013   Procedures:  Procedure(s) (LRB): LEFT TOTAL KNEE REVISION (Left)  Attending Physician:  Dr. Paralee Cancel   Admission Diagnoses:   Left knee OA / AVN / pain  Discharge Diagnoses:  Principal Problem:   S/P left TKA Active Problems:   Morbid obesity   Expected blood loss anemia  Past Medical History  Diagnosis Date  . Hypothyroidism   . Edema   . Hyperlipidemia   . Hypertension     no meds now  . Varicose veins   . DVT (deep venous thrombosis)   . Arthritis   . GERD (gastroesophageal reflux disease)     OCCASIONAL - PRILOSEC IF NEEDED    HPI: Candice Perez, 63 y.o. female, has a history of pain and functional disability in the left knee due to arthritis and has failed non-surgical conservative treatments for greater than 12 weeks to include NSAID's and/or analgesics, corticosteriod injections, use of assistive devices and activity modification. Onset of symptoms was gradual, starting years ago with gradually worsening course since that time. She has been seen and evaluated through Dr. Theda Sers' office with progressive dysfunction in her left knee. Radiographs revealed some concerns for avascular lesions in the medial femoral condyle. MRI had been ordered. She's here to discuss the best form of treatment at this point. She requires assistance for mobilization at times. She did not get any relief with the previous nonsurgical management. She's also had an Economy brace to use. She does have a lot of tenderness over the medial side of the knee. Her knee ligaments are stable. There is pseudolaxity medially. She has full knee extension and pain with flexion. Her right knee is normal for comparative purposes. Advanced degenerative changes of the left knee with an osteochondral defect vs. a small avascular lesion noted on the distal  medial femoral condyle. This was confirmed by MRI with advanced degenerative changes noted as well as meniscal pathology. After reviewing with her the plain x-rays, it is readily evident that the only option available for her at this point would be to have a total knee arthroplasty. Any arthroscopic surgery would not alleviate all the pathology present. The MRI provides helpful information for the extent of bone involvement for preoperative planning. fter reviewing with Ms. Fontanilla these findings, we, at this point, will proceed with total knee arthroplasty on the left. Risks of infection, DVT, component failure, need for future surgery were discussed and reviewed. The postop. course and expectations regarding the post op. pain level were also reviewed.   PCP: Ronita Hipps, MD   Discharged Condition: good  Hospital Course:  Patient underwent the above stated procedure on 08/26/2013. Patient tolerated the procedure well and brought to the recovery room in good condition and subsequently to the floor.  POD #1 BP: 129/70 ; Pulse: 91 ; Temp: 98.1 F (36.7 C) ; Resp: 16  Patient reports pain as mild, pain controlled. No events throughout the night. Discussed case and the procedure. Denies any CP and SOB.  Neurovascular intact, dorsiflexion/plantar flexion intact, incision: dressing C/D/I, no cellulitis present and compartment soft.   LABS  Basename    HGB  10.6  HCT  32.4   POD #2  BP: 149/80 ; Pulse: 75 ; Temp: 98.2 F (36.8 C) ; Resp: 16  Patient reports pain as mild, pain controlled. No events throughout the night. Feels that she  is doing well with PT. Ready to be discharged home. Neurovascular intact, dorsiflexion/plantar flexion intact, incision: dressing C/D/I, no cellulitis present and compartment soft.   LABS  Basename    HGB  9.6  HCT  29.4    Discharge Exam: General appearance: alert, cooperative and no distress Extremities: Homans sign is negative, no sign of DVT, no edema,  redness or tenderness in the calves or thighs and no ulcers, gangrene or trophic changes  Disposition: Home with follow up in 2 weeks   Follow-up Information   Follow up with Mauri Pole, MD. Schedule an appointment as soon as possible for a visit in 2 weeks.   Specialty:  Orthopedic Surgery   Contact information:   [address] [place] [address] [place] [address] [phone]       Discharge Orders   Future Orders Complete By Expires   Call MD / Call 911  As directed    Change dressing  As directed    Constipation Prevention  As directed    Diet - low sodium heart healthy  As directed    Discharge instructions  As directed    Increase activity slowly as tolerated  As directed    TED hose  As directed    Weight bearing as tolerated  As directed    Questions:     Laterality:     Extremity:          Medication List    STOP taking these medications       aspirin 81 MG tablet  Replaced by:  aspirin EC 325 MG tablet     ciprofloxacin 500 MG tablet  Commonly known as:  CIPRO     naproxen sodium 220 MG tablet  Commonly known as:  ANAPROX      TAKE these medications       aspirin EC 325 MG tablet  Take 1 tablet (325 mg total) by mouth 2 (two) times daily. Take for 4 weeks.  Start taking on:  09/10/2013     DSS 100 MG Caps  Take 100 mg by mouth 2 (two) times daily.     ferrous sulfate 325 (65 FE) MG tablet  Take 1 tablet (325 mg total) by mouth 3 (three) times daily after meals.     HYDROcodone-acetaminophen 7.5-325 MG per tablet  Commonly known as:  NORCO  Take 1-2 tablets by mouth every 4 (four) hours as needed for moderate pain.     levothyroxine 100 MCG tablet  Commonly known as:  SYNTHROID, LEVOTHROID  Take 100 mcg by mouth daily before breakfast.     losartan-hydrochlorothiazide 100-25 MG per tablet  Commonly known as:  HYZAAR  Take 1 tablet by mouth every morning.     methocarbamol 500 MG tablet  Commonly known as:  ROBAXIN  Take 1 tablet  (500 mg total) by mouth every 6 (six) hours as needed for muscle spasms.     multivitamin tablet  Take 1 tablet by mouth daily.     mupirocin ointment 2 %  Commonly known as:  BACTROBAN  Place 1 application into the nose 2 (two) times daily. For positive PCR screen at [facility]. Pt has completed 10 dose course     polyethylene glycol packet  Commonly known as:  MIRALAX / GLYCOLAX  Take 17 g by mouth 2 (two) times daily.     rivaroxaban 10 MG Tabs tablet  Commonly known as:  XARELTO  Take 1 tablet (10 mg total) by mouth daily with  breakfast.         Signed: [place] Pugh. Kobee Medlen   PAC  09/01/2013, 4:55 PM

## 2014-07-16 DIAGNOSIS — Z1231 Encounter for screening mammogram for malignant neoplasm of breast: Secondary | ICD-10-CM | POA: Diagnosis not present

## 2014-07-30 DIAGNOSIS — R928 Other abnormal and inconclusive findings on diagnostic imaging of breast: Secondary | ICD-10-CM | POA: Diagnosis not present

## 2014-07-30 DIAGNOSIS — R921 Mammographic calcification found on diagnostic imaging of breast: Secondary | ICD-10-CM | POA: Diagnosis not present

## 2014-08-06 DIAGNOSIS — N63 Unspecified lump in breast: Secondary | ICD-10-CM | POA: Diagnosis not present

## 2014-08-06 DIAGNOSIS — N6031 Fibrosclerosis of right breast: Secondary | ICD-10-CM | POA: Diagnosis not present

## 2014-08-06 DIAGNOSIS — R921 Mammographic calcification found on diagnostic imaging of breast: Secondary | ICD-10-CM | POA: Diagnosis not present

## 2014-08-06 DIAGNOSIS — R928 Other abnormal and inconclusive findings on diagnostic imaging of breast: Secondary | ICD-10-CM | POA: Diagnosis not present

## 2014-08-20 DIAGNOSIS — N281 Cyst of kidney, acquired: Secondary | ICD-10-CM | POA: Diagnosis not present

## 2014-08-20 DIAGNOSIS — R351 Nocturia: Secondary | ICD-10-CM | POA: Diagnosis not present

## 2014-08-20 DIAGNOSIS — R109 Unspecified abdominal pain: Secondary | ICD-10-CM | POA: Diagnosis not present

## 2014-08-20 DIAGNOSIS — N3090 Cystitis, unspecified without hematuria: Secondary | ICD-10-CM | POA: Diagnosis not present

## 2014-08-25 DIAGNOSIS — I10 Essential (primary) hypertension: Secondary | ICD-10-CM | POA: Diagnosis not present

## 2014-08-25 DIAGNOSIS — E039 Hypothyroidism, unspecified: Secondary | ICD-10-CM | POA: Diagnosis not present

## 2014-08-25 DIAGNOSIS — Z79899 Other long term (current) drug therapy: Secondary | ICD-10-CM | POA: Diagnosis not present

## 2014-08-25 DIAGNOSIS — M8580 Other specified disorders of bone density and structure, unspecified site: Secondary | ICD-10-CM | POA: Diagnosis not present

## 2014-08-25 DIAGNOSIS — Z0000 Encounter for general adult medical examination without abnormal findings: Secondary | ICD-10-CM | POA: Diagnosis not present

## 2014-08-25 DIAGNOSIS — Z23 Encounter for immunization: Secondary | ICD-10-CM | POA: Diagnosis not present

## 2014-08-25 DIAGNOSIS — Z124 Encounter for screening for malignant neoplasm of cervix: Secondary | ICD-10-CM | POA: Diagnosis not present

## 2014-08-26 DIAGNOSIS — D3500 Benign neoplasm of unspecified adrenal gland: Secondary | ICD-10-CM | POA: Diagnosis not present

## 2014-08-26 DIAGNOSIS — N281 Cyst of kidney, acquired: Secondary | ICD-10-CM | POA: Diagnosis not present

## 2014-08-26 DIAGNOSIS — R160 Hepatomegaly, not elsewhere classified: Secondary | ICD-10-CM | POA: Diagnosis not present

## 2014-08-26 DIAGNOSIS — K760 Fatty (change of) liver, not elsewhere classified: Secondary | ICD-10-CM | POA: Diagnosis not present

## 2014-08-26 DIAGNOSIS — R109 Unspecified abdominal pain: Secondary | ICD-10-CM | POA: Diagnosis not present

## 2014-08-26 DIAGNOSIS — N261 Atrophy of kidney (terminal): Secondary | ICD-10-CM | POA: Diagnosis not present

## 2014-08-28 DIAGNOSIS — N281 Cyst of kidney, acquired: Secondary | ICD-10-CM | POA: Diagnosis not present

## 2014-08-28 DIAGNOSIS — N3090 Cystitis, unspecified without hematuria: Secondary | ICD-10-CM | POA: Diagnosis not present

## 2014-08-28 DIAGNOSIS — R109 Unspecified abdominal pain: Secondary | ICD-10-CM | POA: Diagnosis not present

## 2014-08-28 DIAGNOSIS — R351 Nocturia: Secondary | ICD-10-CM | POA: Diagnosis not present

## 2014-09-03 DIAGNOSIS — N390 Urinary tract infection, site not specified: Secondary | ICD-10-CM | POA: Diagnosis not present

## 2014-09-09 DIAGNOSIS — Z1211 Encounter for screening for malignant neoplasm of colon: Secondary | ICD-10-CM | POA: Diagnosis not present

## 2014-09-10 DIAGNOSIS — Z96652 Presence of left artificial knee joint: Secondary | ICD-10-CM | POA: Diagnosis not present

## 2014-09-10 DIAGNOSIS — Z471 Aftercare following joint replacement surgery: Secondary | ICD-10-CM | POA: Diagnosis not present

## 2014-10-06 DIAGNOSIS — E039 Hypothyroidism, unspecified: Secondary | ICD-10-CM | POA: Diagnosis not present

## 2014-10-06 DIAGNOSIS — H6090 Unspecified otitis externa, unspecified ear: Secondary | ICD-10-CM | POA: Diagnosis not present

## 2014-10-06 DIAGNOSIS — M549 Dorsalgia, unspecified: Secondary | ICD-10-CM | POA: Diagnosis not present

## 2015-03-04 DIAGNOSIS — K219 Gastro-esophageal reflux disease without esophagitis: Secondary | ICD-10-CM | POA: Diagnosis not present

## 2015-03-23 DIAGNOSIS — Z1211 Encounter for screening for malignant neoplasm of colon: Secondary | ICD-10-CM | POA: Diagnosis not present

## 2015-03-23 DIAGNOSIS — K648 Other hemorrhoids: Secondary | ICD-10-CM | POA: Diagnosis not present

## 2015-03-23 DIAGNOSIS — K635 Polyp of colon: Secondary | ICD-10-CM | POA: Diagnosis not present

## 2015-03-23 DIAGNOSIS — Z86010 Personal history of colonic polyps: Secondary | ICD-10-CM | POA: Diagnosis not present

## 2015-03-23 DIAGNOSIS — Z9049 Acquired absence of other specified parts of digestive tract: Secondary | ICD-10-CM | POA: Diagnosis not present

## 2015-03-23 DIAGNOSIS — I10 Essential (primary) hypertension: Secondary | ICD-10-CM | POA: Diagnosis not present

## 2015-03-23 DIAGNOSIS — K5730 Diverticulosis of large intestine without perforation or abscess without bleeding: Secondary | ICD-10-CM | POA: Diagnosis not present

## 2015-03-23 DIAGNOSIS — E039 Hypothyroidism, unspecified: Secondary | ICD-10-CM | POA: Diagnosis not present

## 2015-03-23 DIAGNOSIS — K621 Rectal polyp: Secondary | ICD-10-CM | POA: Diagnosis not present

## 2015-05-03 DIAGNOSIS — J00 Acute nasopharyngitis [common cold]: Secondary | ICD-10-CM | POA: Diagnosis not present

## 2015-05-18 DIAGNOSIS — J209 Acute bronchitis, unspecified: Secondary | ICD-10-CM | POA: Diagnosis not present

## 2015-06-24 DIAGNOSIS — M25561 Pain in right knee: Secondary | ICD-10-CM | POA: Diagnosis not present

## 2016-02-03 DIAGNOSIS — Z0000 Encounter for general adult medical examination without abnormal findings: Secondary | ICD-10-CM | POA: Diagnosis not present

## 2016-02-03 DIAGNOSIS — Z23 Encounter for immunization: Secondary | ICD-10-CM | POA: Diagnosis not present

## 2016-02-03 DIAGNOSIS — E039 Hypothyroidism, unspecified: Secondary | ICD-10-CM | POA: Diagnosis not present

## 2016-02-03 DIAGNOSIS — Z1389 Encounter for screening for other disorder: Secondary | ICD-10-CM | POA: Diagnosis not present

## 2016-02-03 DIAGNOSIS — I10 Essential (primary) hypertension: Secondary | ICD-10-CM | POA: Diagnosis not present

## 2016-02-17 DIAGNOSIS — I10 Essential (primary) hypertension: Secondary | ICD-10-CM | POA: Diagnosis not present

## 2016-06-20 DIAGNOSIS — H5203 Hypermetropia, bilateral: Secondary | ICD-10-CM | POA: Diagnosis not present

## 2016-06-20 DIAGNOSIS — D3131 Benign neoplasm of right choroid: Secondary | ICD-10-CM | POA: Diagnosis not present

## 2016-09-11 DIAGNOSIS — I10 Essential (primary) hypertension: Secondary | ICD-10-CM | POA: Diagnosis not present

## 2016-09-13 DIAGNOSIS — Z1231 Encounter for screening mammogram for malignant neoplasm of breast: Secondary | ICD-10-CM | POA: Diagnosis not present

## 2017-03-16 DIAGNOSIS — Z79899 Other long term (current) drug therapy: Secondary | ICD-10-CM | POA: Diagnosis not present

## 2017-03-16 DIAGNOSIS — Z23 Encounter for immunization: Secondary | ICD-10-CM | POA: Diagnosis not present

## 2017-03-16 DIAGNOSIS — I10 Essential (primary) hypertension: Secondary | ICD-10-CM | POA: Diagnosis not present

## 2017-03-16 DIAGNOSIS — E039 Hypothyroidism, unspecified: Secondary | ICD-10-CM | POA: Diagnosis not present

## 2017-03-16 DIAGNOSIS — Z0000 Encounter for general adult medical examination without abnormal findings: Secondary | ICD-10-CM | POA: Diagnosis not present

## 2017-04-18 DIAGNOSIS — R300 Dysuria: Secondary | ICD-10-CM | POA: Diagnosis not present

## 2017-05-28 DIAGNOSIS — R200 Anesthesia of skin: Secondary | ICD-10-CM | POA: Diagnosis not present

## 2017-05-28 DIAGNOSIS — R69 Illness, unspecified: Secondary | ICD-10-CM | POA: Diagnosis not present

## 2017-05-28 DIAGNOSIS — Z6841 Body Mass Index (BMI) 40.0 and over, adult: Secondary | ICD-10-CM | POA: Diagnosis not present

## 2017-06-15 ENCOUNTER — Encounter: Payer: Self-pay | Admitting: Neurology

## 2017-06-15 ENCOUNTER — Ambulatory Visit: Payer: Medicare HMO | Admitting: Neurology

## 2017-06-15 VITALS — BP 194/86 | HR 61 | Ht 67.0 in | Wt 259.0 lb

## 2017-06-15 DIAGNOSIS — G459 Transient cerebral ischemic attack, unspecified: Secondary | ICD-10-CM | POA: Diagnosis not present

## 2017-06-15 MED ORDER — CLOPIDOGREL BISULFATE 75 MG PO TABS: 75.0000 mg | ORAL_TABLET | Freq: Every day | ORAL | 11 refills | Status: DC

## 2017-06-15 NOTE — Progress Notes (Signed)
PATIENT: Candice Perez DOB: 1950/06/09  Chief Complaint  Patient presents with  . Right arm numbness    Reports one episode of right arm numbness and weakness that only lasted a few minutes.  Her symptoms resolved without treatment.  No further events have occurred.   Marland Kitchen PCP    Ronita Hipps, MD     HISTORICAL  Candice Perez is a 67 year old female, seen in refer by her primary care doctor Kennith Maes S for evaluation of sudden onset right arm weakness, initial evaluation was on June 15, 2017.  She had a history of hypertension, hypothyroidism, on supplement, depression  On June 02, 2017, woke up from overnight sleep, when trying to make coffee, she noticed right arm numbness, then went limp, she could not hold her coffee pot, symptoms last for a few minutes, she denied dysarthria, no right leg weakness, no gait abnormality.  She does take aspirin 81 mg daily, her sister suffered the stroke.   REVIEW OF SYSTEMS: Full 14 system review of systems performed and notable only for as above  ALLERGIES: No Known Allergies  HOME MEDICATIONS: Current Outpatient Medications  Medication Sig Dispense Refill  . buPROPion (WELLBUTRIN XL) 150 MG 24 hr tablet Take 150 mg by mouth daily.    Marland Kitchen levothyroxine (SYNTHROID, LEVOTHROID) 100 MCG tablet Take 100 mcg by mouth daily before breakfast.    . losartan-hydrochlorothiazide (HYZAAR) 100-25 MG per tablet Take 1 tablet by mouth every morning.     No current facility-administered medications for this visit.     PAST MEDICAL HISTORY: Past Medical History:  Diagnosis Date  . Arthritis   . DVT (deep venous thrombosis) ([place])   . Edema   . GERD (gastroesophageal reflux disease)    OCCASIONAL - PRILOSEC IF NEEDED  . Hyperlipidemia   . Hypertension    no meds now  . Hypothyroidism   . Varicose veins     PAST SURGICAL HISTORY: Past Surgical History:  Procedure Laterality Date  . CHOLECYSTECTOMY     Laparoscopic  . CYSTOCELE  REPAIR    . HERNIA REPAIR    . TONSILLECTOMY    . TOTAL KNEE ARTHROPLASTY Left 08/26/2013   Procedure: LEFT TOTAL KNEE REVISION;  Surgeon: Mauri Pole, MD;  Location: [facility];  Service: Orthopedics;  Laterality: Left;  Marland Kitchen VARICOSE VEIN SURGERY      FAMILY HISTORY: Family History  Problem Relation Age of Onset  . Congestive Heart Failure Mother   . Cancer Mother        tongue/sinus  . Cancer Father        "in his back"  . Other Father        Varicose veins, Arteriosclerosis    SOCIAL HISTORY:  Social History   Socioeconomic History  . Marital status: Married    Spouse name: Not on file  . Number of children: 2  . Years of education: 9  . Highest education level: Not on file  Social Needs  . Financial resource strain: Not on file  . Food insecurity - worry: Not on file  . Food insecurity - inability: Not on file  . Transportation needs - medical: Not on file  . Transportation needs - non-medical: Not on file  Occupational History  . Occupation: Retired  Tobacco Use  . Smoking status: Former Smoker    Years: 40.00    Types: Cigarettes    Last attempt to quit: 09/17/2010    Years since quitting: 6.7  .  Smokeless tobacco: Never Used  Substance and Sexual Activity  . Alcohol use: No  . Drug use: No  . Sexual activity: Not on file  Other Topics Concern  . Not on file  Social History Narrative   Lives at home with her husband.   Right-handed.   No daily caffeine use.     PHYSICAL EXAM   Vitals:   06/15/17 0848  BP: (!) 194/86  Pulse: 61  Weight: 259 lb (117.5 kg)  Height: 5\' 7"  (1.702 m)    Not recorded      Body mass index is 40.57 kg/m.  PHYSICAL EXAMNIATION:  Gen: NAD, conversant, well nourised, obese, well groomed                     Cardiovascular: Regular rate rhythm, no peripheral edema, warm, nontender. Eyes: Conjunctivae clear without exudates or hemorrhage Neck: Supple, no carotid bruits. Pulmonary: Clear to auscultation bilaterally    NEUROLOGICAL EXAM:  MENTAL STATUS: Speech:    Speech is normal; fluent and spontaneous with normal comprehension.  Cognition:     Orientation to time, place and person     Normal recent and remote memory     Normal Attention span and concentration     Normal Language, naming, repeating,spontaneous speech     Fund of knowledge   CRANIAL NERVES: CN II: Visual fields are full to confrontation. Fundoscopic exam is normal with sharp discs and no vascular changes. Pupils are round equal and briskly reactive to light. CN III, IV, VI: extraocular movement are normal. No ptosis. CN V: Facial sensation is intact to pinprick in all 3 divisions bilaterally. Corneal responses are intact.  CN VII: Face is symmetric with normal eye closure and smile. CN VIII: Hearing is normal to rubbing fingers CN IX, X: Palate elevates symmetrically. Phonation is normal. CN XI: Head turning and shoulder shrug are intact CN XII: Tongue is midline with normal movements and no atrophy.  MOTOR: There is no pronator drift of out-stretched arms. Muscle bulk and tone are normal. Muscle strength is normal.  REFLEXES: Reflexes are 2+ and symmetric at the biceps, triceps, knees, and ankles. Plantar responses are flexor.  SENSORY: Intact to light touch, pinprick, positional sensation and vibratory sensation are intact in fingers and toes.  COORDINATION: Rapid alternating movements and fine finger movements are intact. There is no dysmetria on finger-to-nose and heel-knee-shin.    GAIT/STANCE: Posture is normal. Gait is steady with normal steps, base, arm swing, and turning. Heel and toe walking are normal. Tandem gait is normal.  Romberg is absent.   DIAGNOSTIC DATA (LABS, IMAGING, TESTING) - I reviewed patient records, labs, notes, testing and imaging myself where available.   ASSESSMENT AND PLAN  CORNELLA EMMER is a 67 y.o. female   Transient acute onset of right arm weakness  TIA, localized lesion to  posterior limb of left internal capsule    She has vascular risk factor of aging, obesity, hypertension, sedentary lifestyle  Stop aspirin 81 mg start Plavix 75 mg daily  Complete evaluation with MRI of the brain,  Ultrasound of carotid artery  Echocardiogram   Marcial Pacas, M.D. Ph.D.  [facility] [address] [place], [place] [address] Ph: [phone] Fax: [phone]  CC: Ronita Hipps, MD

## 2017-06-16 LAB — CBC
HEMOGLOBIN: 12.9 g/dL (ref 11.1–15.9)
Hematocrit: 38.6 % (ref 34.0–46.6)
MCH: 28.8 pg (ref 26.6–33.0)
MCHC: 33.4 g/dL (ref 31.5–35.7)
MCV: 86 fL (ref 79–97)
Platelets: 265 10*3/uL (ref 150–379)
RBC: 4.48 x10E6/uL (ref 3.77–5.28)
RDW: 14.5 % (ref 12.3–15.4)
WBC: 7.7 10*3/uL (ref 3.4–10.8)

## 2017-06-16 LAB — COMPREHENSIVE METABOLIC PANEL
ALT: 19 IU/L (ref 0–32)
AST: 14 IU/L (ref 0–40)
Albumin/Globulin Ratio: 1.5 (ref 1.2–2.2)
Albumin: 4.2 g/dL (ref 3.6–4.8)
Alkaline Phosphatase: 70 IU/L (ref 39–117)
BUN/Creatinine Ratio: 19 (ref 12–28)
BUN: 19 mg/dL (ref 8–27)
Bilirubin Total: 0.2 mg/dL (ref 0.0–1.2)
CALCIUM: 9.9 mg/dL (ref 8.7–10.3)
CO2: 25 mmol/L (ref 20–29)
CREATININE: 0.99 mg/dL (ref 0.57–1.00)
Chloride: 100 mmol/L (ref 96–106)
GFR calc Af Amer: 68 mL/min/{1.73_m2} (ref >59)
GFR, EST NON AFRICAN AMERICAN: 59 mL/min/{1.73_m2} — AB (ref >59)
Globulin, Total: 2.8 g/dL (ref 1.5–4.5)
Glucose: 98 mg/dL (ref 65–99)
Potassium: 5.2 mmol/L (ref 3.5–5.2)
Sodium: 142 mmol/L (ref 134–144)
TOTAL PROTEIN: 7 g/dL (ref 6.0–8.5)

## 2017-06-16 LAB — RPR: RPR Ser Ql: NONREACTIVE

## 2017-06-16 LAB — VITAMIN B12: VITAMIN B 12: 575 pg/mL (ref 232–1245)

## 2017-06-16 LAB — TSH: TSH: 7.55 u[IU]/mL — AB (ref 0.450–4.500)

## 2017-06-16 LAB — HGB A1C W/O EAG: Hgb A1c MFr Bld: 6 % — ABNORMAL HIGH (ref 4.8–5.6)

## 2017-06-20 ENCOUNTER — Telehealth: Payer: Self-pay | Admitting: Neurology

## 2017-06-20 ENCOUNTER — Ambulatory Visit (HOSPITAL_COMMUNITY)
Admission: RE | Admit: 2017-06-20 | Discharge: 2017-06-20 | Disposition: A | Discharge status: Home / Self Care | Payer: Medicare HMO | Source: Ambulatory Visit | Attending: Neurology | Admitting: Neurology

## 2017-06-20 ENCOUNTER — Ambulatory Visit (HOSPITAL_BASED_OUTPATIENT_CLINIC_OR_DEPARTMENT_OTHER)
Admission: RE | Admit: 2017-06-20 | Discharge: 2017-06-20 | Disposition: A | Discharge status: Home / Self Care | Payer: Medicare HMO | Source: Ambulatory Visit | Attending: Neurology | Admitting: Neurology

## 2017-06-20 DIAGNOSIS — I10 Essential (primary) hypertension: Secondary | ICD-10-CM | POA: Insufficient documentation

## 2017-06-20 DIAGNOSIS — I6523 Occlusion and stenosis of bilateral carotid arteries: Secondary | ICD-10-CM | POA: Diagnosis not present

## 2017-06-20 DIAGNOSIS — G459 Transient cerebral ischemic attack, unspecified: Secondary | ICD-10-CM

## 2017-06-20 NOTE — Telephone Encounter (Signed)
Spoke to patient she is aware of the results ?

## 2017-06-20 NOTE — Progress Notes (Signed)
  Echocardiogram 2D Echocardiogram has been performed.  Candice Perez 06/20/2017, 2:57 PM

## 2017-06-20 NOTE — Telephone Encounter (Signed)
Please call patient, US carotid showed: Right IC <39% stenosis, Left IC 40-59% stenosis.  Final Interpretation: Right Carotid: Velocities in the right ICA are consistent with a 1-39% stenosis.  Left Carotid: Velocities in the left ICA are consistent with a 40-59% stenosis.  Vertebrals: Left vertebral artery was patent with antegrade flow. Right      vertebral artery was not visualized.  *See table(s) above for measurements and observations.

## 2017-06-20 NOTE — Telephone Encounter (Signed)
Called and left detailed message (ok per DPR) about lab results per Dr. Krista Blue message below. Advised her to contact PCP to f/u. Gave [facility] phone number if she has further questions/concerns.

## 2017-06-20 NOTE — Progress Notes (Signed)
*  PRELIMINARY RESULTS* Vascular Ultrasound Carotid Duplex (Doppler) has been completed.  Preliminary findings: Right Carotid: Velocities in the right ICA are consistent with a 1-39% stenosis.    Left Carotid: Velocities in the left ICA are consistent with a 40-59% stenosis.   Vertebrals: Left vertebral artery was patent with antegrade flow. Right vertebral artery was not visualized.  **Difficult study secondary to pulsatile vessels, acoustic shadowing and tissue attenuation.   Everrett Coombe 06/20/2017, 3:30 PM

## 2017-06-20 NOTE — Telephone Encounter (Signed)
Please call patient, laboratory evaluation showed elevated A1c 6.0, elevated TSH 7.5, indicating hypothyroidism  She should contact her primary care to optimize her hypothyroidism supplement, I have faxed the laboratory result to her primary care,  Mild elevated A1c, she should optimize her glucose control.

## 2017-06-21 ENCOUNTER — Telehealth: Payer: Self-pay | Admitting: Neurology

## 2017-06-21 NOTE — Telephone Encounter (Signed)
Please call patient, echocardiogram showed no significant abnormality. 

## 2017-06-21 NOTE — Telephone Encounter (Signed)
Spoke to patient she is aware of results

## 2017-06-23 ENCOUNTER — Ambulatory Visit
Admission: RE | Admit: 2017-06-23 | Discharge: 2017-06-23 | Disposition: A | Discharge status: Home / Self Care | Payer: Medicare HMO | Source: Ambulatory Visit | Attending: Neurology | Admitting: Neurology

## 2017-06-23 DIAGNOSIS — G459 Transient cerebral ischemic attack, unspecified: Secondary | ICD-10-CM | POA: Diagnosis not present

## 2017-06-25 ENCOUNTER — Telehealth: Payer: Self-pay | Admitting: *Deleted

## 2017-06-25 NOTE — Telephone Encounter (Signed)
Spoke to patient she is aware of results

## 2017-06-25 NOTE — Telephone Encounter (Signed)
-----   Message from Marcial Pacas, MD sent at 06/25/2017  4:37 PM EST ----- Please call pt for no significant abnormality on MRI of the brain

## 2017-08-07 ENCOUNTER — Other Ambulatory Visit: Payer: Self-pay | Admitting: *Deleted

## 2017-08-07 ENCOUNTER — Encounter (INDEPENDENT_AMBULATORY_CARE_PROVIDER_SITE_OTHER): Payer: Self-pay

## 2017-08-07 ENCOUNTER — Encounter: Payer: Self-pay | Admitting: Neurology

## 2017-08-07 ENCOUNTER — Ambulatory Visit: Payer: Medicare HMO | Admitting: Neurology

## 2017-08-07 VITALS — BP 171/91 | HR 69 | Ht 67.0 in | Wt 268.0 lb

## 2017-08-07 DIAGNOSIS — G459 Transient cerebral ischemic attack, unspecified: Secondary | ICD-10-CM | POA: Diagnosis not present

## 2017-08-07 MED ORDER — CLOPIDOGREL BISULFATE 75 MG PO TABS: 75.0000 mg | ORAL_TABLET | Freq: Every day | ORAL | 3 refills | Status: DC

## 2017-08-07 NOTE — Progress Notes (Signed)
PATIENT: Candice Perez DOB: Jul 26, 1950  Chief Complaint  Patient presents with  . Transient Ischemic Attack    She is taking Plavix 75mg  daily.  She would like to further review her MRI, ECHO and carotid ultrasound results.  No further episodes of numbness reported.     HISTORICAL  Candice Perez is a 67 year old female, seen in refer by her primary care doctor Kennith Maes S for evaluation of sudden onset right arm weakness, initial evaluation was on June 15, 2017.  She had a history of hypertension, hypothyroidism, on supplement, depression  On June 02, 2017, woke up from overnight sleep, when trying to make coffee, she noticed right arm numbness, then went limp, she could not hold her coffee pot, symptoms last for a few minutes, she denied dysarthria, no right leg weakness, no gait abnormality.  She does take aspirin 81 mg daily, her sister suffered the stroke.  UPDATE August 07 2017: Echocardiogram showed ejection fraction 55-60%, wall motion was normal.  Ultrasound of carotid arteries showed less than 39% stenosis, left side 40-59% stenosis, vertebral artery is patent with anterograde flow, right vertebral artery was  MRI of the brain showed evidence of mild atrophy, mild supratentorium small vessel disease no acute abnormality  REVIEW OF SYSTEMS: Full 14 system review of systems performed and notable only for as above  ALLERGIES: No Known Allergies  HOME MEDICATIONS: Current Outpatient Medications  Medication Sig Dispense Refill  . buPROPion (WELLBUTRIN XL) 150 MG 24 hr tablet Take 150 mg by mouth daily.    Marland Kitchen levothyroxine (SYNTHROID, LEVOTHROID) 100 MCG tablet Take 100 mcg by mouth daily before breakfast.    . losartan-hydrochlorothiazide (HYZAAR) 100-25 MG per tablet Take 1 tablet by mouth every morning.    . clopidogrel (PLAVIX) 75 MG tablet Take 1 tablet (75 mg total) by mouth daily. 90 tablet 3   No current facility-administered medications for this  visit.     PAST MEDICAL HISTORY: Past Medical History:  Diagnosis Date  . Arthritis   . DVT (deep venous thrombosis) ([place])   . Edema   . GERD (gastroesophageal reflux disease)    OCCASIONAL - PRILOSEC IF NEEDED  . Hyperlipidemia   . Hypertension    no meds now  . Hypothyroidism   . Varicose veins     PAST SURGICAL HISTORY: Past Surgical History:  Procedure Laterality Date  . CHOLECYSTECTOMY     Laparoscopic  . CYSTOCELE REPAIR    . HERNIA REPAIR    . TONSILLECTOMY    . TOTAL KNEE ARTHROPLASTY Left 08/26/2013   Procedure: LEFT TOTAL KNEE REVISION;  Surgeon: Mauri Pole, MD;  Location: [facility];  Service: Orthopedics;  Laterality: Left;  Marland Kitchen VARICOSE VEIN SURGERY      FAMILY HISTORY: Family History  Problem Relation Age of Onset  . Congestive Heart Failure Mother   . Cancer Mother        tongue/sinus  . Cancer Father        "in his back"  . Other Father        Varicose veins, Arteriosclerosis    SOCIAL HISTORY:  Social History   Socioeconomic History  . Marital status: Married    Spouse name: Not on file  . Number of children: 2  . Years of education: 9  . Highest education level: Not on file  Occupational History  . Occupation: Retired  Scientific laboratory technician  . Financial resource strain: Not on file  . Food insecurity:  Worry: Not on file    Inability: Not on file  . Transportation needs:    Medical: Not on file    Non-medical: Not on file  Tobacco Use  . Smoking status: Former Smoker    Years: 40.00    Types: Cigarettes    Last attempt to quit: 09/17/2010    Years since quitting: 6.8  . Smokeless tobacco: Never Used  Substance and Sexual Activity  . Alcohol use: No  . Drug use: No  . Sexual activity: Not on file  Lifestyle  . Physical activity:    Days per week: Not on file    Minutes per session: Not on file  . Stress: Not on file  Relationships  . Social connections:    Talks on phone: Not on file    Gets together: Not on file    Attends  religious service: Not on file    Active member of club or organization: Not on file    Attends meetings of clubs or organizations: Not on file    Relationship status: Not on file  . Intimate partner violence:    Fear of current or ex partner: Not on file    Emotionally abused: Not on file    Physically abused: Not on file    Forced sexual activity: Not on file  Other Topics Concern  . Not on file  Social History Narrative   Lives at home with her husband.   Right-handed.   No daily caffeine use.     PHYSICAL EXAM   Vitals:   08/07/17 0742  BP: (!) 171/91  Pulse: 69  Weight: 268 lb (121.6 kg)  Height: 5\' 7"  (1.702 m)    Not recorded      Body mass index is 41.97 kg/m.  PHYSICAL EXAMNIATION:  Gen: NAD, conversant, well nourised, obese, well groomed                     Cardiovascular: Regular rate rhythm, no peripheral edema, warm, nontender. Eyes: Conjunctivae clear without exudates or hemorrhage Neck: Supple, no carotid bruits. Pulmonary: Clear to auscultation bilaterally   NEUROLOGICAL EXAM:  MENTAL STATUS: Speech:    Speech is normal; fluent and spontaneous with normal comprehension.  Cognition:     Orientation to time, place and person     Normal recent and remote memory     Normal Attention span and concentration     Normal Language, naming, repeating,spontaneous speech     Fund of knowledge   CRANIAL NERVES: CN II: Visual fields are full to confrontation. Fundoscopic exam is normal with sharp discs and no vascular changes. Pupils are round equal and briskly reactive to light. CN III, IV, VI: extraocular movement are normal. No ptosis. CN V: Facial sensation is intact to pinprick in all 3 divisions bilaterally. Corneal responses are intact.  CN VII: Face is symmetric with normal eye closure and smile. CN VIII: Hearing is normal to rubbing fingers CN IX, X: Palate elevates symmetrically. Phonation is normal. CN XI: Head turning and shoulder shrug are  intact CN XII: Tongue is midline with normal movements and no atrophy.  MOTOR: There is no pronator drift of out-stretched arms. Muscle bulk and tone are normal. Muscle strength is normal.  REFLEXES: Reflexes are 2+ and symmetric at the biceps, triceps, knees, and ankles. Plantar responses are flexor.  SENSORY: Intact to light touch, pinprick, positional sensation and vibratory sensation are intact in fingers and toes.  COORDINATION: Rapid alternating movements  and fine finger movements are intact. There is no dysmetria on finger-to-nose and heel-knee-shin.    GAIT/STANCE: Posture is normal. Gait is steady with normal steps, base, arm swing, and turning. Heel and toe walking are normal. Tandem gait is normal.  Romberg is absent.   DIAGNOSTIC DATA (LABS, IMAGING, TESTING) - I reviewed patient records, labs, notes, testing and imaging myself where available.   ASSESSMENT AND PLAN  Candice Perez is a 67 y.o. female   Transient acute onset of right arm weakness  Possible small vessel TIA, localized to posterior limb of left internal capsule  Evidence of carotid artery stenosis  She has vascular risk factor of aging, obesity, hypertension, sedentary lifestyle  Keep Plavix 75 mg daily  Continue follow-up with her primary care physician     Marcial Pacas, M.D. Ph.D.  [facility] [address], [place], [place] [address] Ph: [phone] Fax: [phone]  CC: Ronita Hipps, MD

## 2017-09-21 DIAGNOSIS — Z6841 Body Mass Index (BMI) 40.0 and over, adult: Secondary | ICD-10-CM | POA: Diagnosis not present

## 2017-09-21 DIAGNOSIS — R300 Dysuria: Secondary | ICD-10-CM | POA: Diagnosis not present

## 2018-03-18 DIAGNOSIS — Z0000 Encounter for general adult medical examination without abnormal findings: Secondary | ICD-10-CM | POA: Diagnosis not present

## 2018-03-18 DIAGNOSIS — I10 Essential (primary) hypertension: Secondary | ICD-10-CM | POA: Diagnosis not present

## 2018-03-18 DIAGNOSIS — Z79899 Other long term (current) drug therapy: Secondary | ICD-10-CM | POA: Diagnosis not present

## 2018-03-18 DIAGNOSIS — Z6841 Body Mass Index (BMI) 40.0 and over, adult: Secondary | ICD-10-CM | POA: Diagnosis not present

## 2018-03-18 DIAGNOSIS — E039 Hypothyroidism, unspecified: Secondary | ICD-10-CM | POA: Diagnosis not present

## 2018-03-18 DIAGNOSIS — R69 Illness, unspecified: Secondary | ICD-10-CM | POA: Diagnosis not present

## 2018-03-18 DIAGNOSIS — Z23 Encounter for immunization: Secondary | ICD-10-CM | POA: Diagnosis not present

## 2018-03-25 DIAGNOSIS — H5200 Hypermetropia, unspecified eye: Secondary | ICD-10-CM | POA: Diagnosis not present

## 2018-05-13 ENCOUNTER — Telehealth: Payer: Self-pay | Admitting: Neurology

## 2018-05-13 NOTE — Telephone Encounter (Signed)
Patient should have another refill on file at the pharmacy for 90-days worth of medication.  I have called [facility] and they verified this information.  They are going to get the prescription ready for pick up.  She should request future refills from her PCP. I have spoken with the patient and she is aware of this information.

## 2018-05-13 NOTE — Telephone Encounter (Signed)
Patient requesting refill of clopidogrel (PLAVIX) 75 MG tablet sent to [facility] in [place]. I advised Dr. Krista Blue is out of the office and will send to her nurse.

## 2018-08-10 ENCOUNTER — Other Ambulatory Visit: Payer: Self-pay | Admitting: Neurology

## 2018-08-29 DIAGNOSIS — J329 Chronic sinusitis, unspecified: Secondary | ICD-10-CM | POA: Diagnosis not present

## 2019-03-20 DIAGNOSIS — I10 Essential (primary) hypertension: Secondary | ICD-10-CM | POA: Diagnosis not present

## 2019-03-20 DIAGNOSIS — Z79899 Other long term (current) drug therapy: Secondary | ICD-10-CM | POA: Diagnosis not present

## 2019-03-20 DIAGNOSIS — E039 Hypothyroidism, unspecified: Secondary | ICD-10-CM | POA: Diagnosis not present

## 2019-03-25 DIAGNOSIS — Z1331 Encounter for screening for depression: Secondary | ICD-10-CM | POA: Diagnosis not present

## 2019-03-25 DIAGNOSIS — I10 Essential (primary) hypertension: Secondary | ICD-10-CM | POA: Diagnosis not present

## 2019-03-25 DIAGNOSIS — Z0000 Encounter for general adult medical examination without abnormal findings: Secondary | ICD-10-CM | POA: Diagnosis not present

## 2019-03-25 DIAGNOSIS — R69 Illness, unspecified: Secondary | ICD-10-CM | POA: Diagnosis not present

## 2019-03-25 DIAGNOSIS — Z6841 Body Mass Index (BMI) 40.0 and over, adult: Secondary | ICD-10-CM | POA: Diagnosis not present

## 2019-03-25 DIAGNOSIS — E039 Hypothyroidism, unspecified: Secondary | ICD-10-CM | POA: Diagnosis not present

## 2019-03-25 DIAGNOSIS — Z23 Encounter for immunization: Secondary | ICD-10-CM | POA: Diagnosis not present

## 2019-04-18 DIAGNOSIS — H5203 Hypermetropia, bilateral: Secondary | ICD-10-CM | POA: Diagnosis not present

## 2019-04-18 DIAGNOSIS — Z1231 Encounter for screening mammogram for malignant neoplasm of breast: Secondary | ICD-10-CM | POA: Diagnosis not present

## 2019-05-21 DIAGNOSIS — J069 Acute upper respiratory infection, unspecified: Secondary | ICD-10-CM | POA: Diagnosis not present

## 2019-05-21 DIAGNOSIS — U071 COVID-19: Secondary | ICD-10-CM | POA: Diagnosis not present

## 2020-04-19 DIAGNOSIS — H5203 Hypermetropia, bilateral: Secondary | ICD-10-CM | POA: Diagnosis not present

## 2020-04-27 DIAGNOSIS — Z1331 Encounter for screening for depression: Secondary | ICD-10-CM | POA: Diagnosis not present

## 2020-04-27 DIAGNOSIS — E039 Hypothyroidism, unspecified: Secondary | ICD-10-CM | POA: Diagnosis not present

## 2020-04-27 DIAGNOSIS — I10 Essential (primary) hypertension: Secondary | ICD-10-CM | POA: Diagnosis not present

## 2020-04-27 DIAGNOSIS — R69 Illness, unspecified: Secondary | ICD-10-CM | POA: Diagnosis not present

## 2020-04-27 DIAGNOSIS — Z0000 Encounter for general adult medical examination without abnormal findings: Secondary | ICD-10-CM | POA: Diagnosis not present

## 2020-04-27 DIAGNOSIS — M8580 Other specified disorders of bone density and structure, unspecified site: Secondary | ICD-10-CM | POA: Diagnosis not present

## 2020-04-27 DIAGNOSIS — Z23 Encounter for immunization: Secondary | ICD-10-CM | POA: Diagnosis not present

## 2020-04-27 DIAGNOSIS — Z6841 Body Mass Index (BMI) 40.0 and over, adult: Secondary | ICD-10-CM | POA: Diagnosis not present

## 2020-04-27 DIAGNOSIS — Z79899 Other long term (current) drug therapy: Secondary | ICD-10-CM | POA: Diagnosis not present

## 2020-05-15 DIAGNOSIS — I639 Cerebral infarction, unspecified: Secondary | ICD-10-CM

## 2020-05-15 HISTORY — DX: Cerebral infarction, unspecified: I63.9

## 2020-05-28 DIAGNOSIS — Z1231 Encounter for screening mammogram for malignant neoplasm of breast: Secondary | ICD-10-CM | POA: Diagnosis not present

## 2020-06-13 DIAGNOSIS — I10 Essential (primary) hypertension: Secondary | ICD-10-CM | POA: Diagnosis not present

## 2020-06-13 DIAGNOSIS — E039 Hypothyroidism, unspecified: Secondary | ICD-10-CM | POA: Diagnosis not present

## 2020-06-13 DIAGNOSIS — R69 Illness, unspecified: Secondary | ICD-10-CM | POA: Diagnosis not present

## 2020-06-21 DIAGNOSIS — Z01818 Encounter for other preprocedural examination: Secondary | ICD-10-CM | POA: Diagnosis not present

## 2020-06-21 DIAGNOSIS — D126 Benign neoplasm of colon, unspecified: Secondary | ICD-10-CM | POA: Diagnosis not present

## 2020-07-01 DIAGNOSIS — Z20828 Contact with and (suspected) exposure to other viral communicable diseases: Secondary | ICD-10-CM | POA: Diagnosis not present

## 2020-07-01 DIAGNOSIS — Z1159 Encounter for screening for other viral diseases: Secondary | ICD-10-CM | POA: Diagnosis not present

## 2020-07-08 DIAGNOSIS — Z1211 Encounter for screening for malignant neoplasm of colon: Secondary | ICD-10-CM | POA: Diagnosis not present

## 2020-07-08 DIAGNOSIS — Z09 Encounter for follow-up examination after completed treatment for conditions other than malignant neoplasm: Secondary | ICD-10-CM | POA: Diagnosis not present

## 2020-07-08 DIAGNOSIS — Z86010 Personal history of colonic polyps: Secondary | ICD-10-CM | POA: Diagnosis not present

## 2020-07-08 DIAGNOSIS — K5730 Diverticulosis of large intestine without perforation or abscess without bleeding: Secondary | ICD-10-CM | POA: Diagnosis not present

## 2020-07-12 DIAGNOSIS — R69 Illness, unspecified: Secondary | ICD-10-CM | POA: Diagnosis not present

## 2020-07-12 DIAGNOSIS — I10 Essential (primary) hypertension: Secondary | ICD-10-CM | POA: Diagnosis not present

## 2020-07-12 DIAGNOSIS — E039 Hypothyroidism, unspecified: Secondary | ICD-10-CM | POA: Diagnosis not present

## 2020-09-11 DIAGNOSIS — E039 Hypothyroidism, unspecified: Secondary | ICD-10-CM | POA: Diagnosis not present

## 2020-09-11 DIAGNOSIS — R69 Illness, unspecified: Secondary | ICD-10-CM | POA: Diagnosis not present

## 2020-09-11 DIAGNOSIS — I10 Essential (primary) hypertension: Secondary | ICD-10-CM | POA: Diagnosis not present

## 2020-11-02 DIAGNOSIS — G8929 Other chronic pain: Secondary | ICD-10-CM | POA: Diagnosis not present

## 2020-11-02 DIAGNOSIS — Z7722 Contact with and (suspected) exposure to environmental tobacco smoke (acute) (chronic): Secondary | ICD-10-CM | POA: Diagnosis not present

## 2020-11-02 DIAGNOSIS — R69 Illness, unspecified: Secondary | ICD-10-CM | POA: Diagnosis not present

## 2020-11-02 DIAGNOSIS — K5900 Constipation, unspecified: Secondary | ICD-10-CM | POA: Diagnosis not present

## 2020-11-02 DIAGNOSIS — M1990 Unspecified osteoarthritis, unspecified site: Secondary | ICD-10-CM | POA: Diagnosis not present

## 2020-11-02 DIAGNOSIS — Z6841 Body Mass Index (BMI) 40.0 and over, adult: Secondary | ICD-10-CM | POA: Diagnosis not present

## 2020-11-02 DIAGNOSIS — I10 Essential (primary) hypertension: Secondary | ICD-10-CM | POA: Diagnosis not present

## 2020-11-02 DIAGNOSIS — E039 Hypothyroidism, unspecified: Secondary | ICD-10-CM | POA: Diagnosis not present

## 2020-11-02 DIAGNOSIS — F419 Anxiety disorder, unspecified: Secondary | ICD-10-CM | POA: Diagnosis not present

## 2020-11-04 DIAGNOSIS — Z6841 Body Mass Index (BMI) 40.0 and over, adult: Secondary | ICD-10-CM | POA: Diagnosis not present

## 2020-11-04 DIAGNOSIS — I10 Essential (primary) hypertension: Secondary | ICD-10-CM | POA: Diagnosis not present

## 2020-11-11 DIAGNOSIS — R69 Illness, unspecified: Secondary | ICD-10-CM | POA: Diagnosis not present

## 2020-11-11 DIAGNOSIS — E039 Hypothyroidism, unspecified: Secondary | ICD-10-CM | POA: Diagnosis not present

## 2020-11-11 DIAGNOSIS — I10 Essential (primary) hypertension: Secondary | ICD-10-CM | POA: Diagnosis not present

## 2020-11-29 DIAGNOSIS — Z6841 Body Mass Index (BMI) 40.0 and over, adult: Secondary | ICD-10-CM | POA: Diagnosis not present

## 2020-11-29 DIAGNOSIS — I10 Essential (primary) hypertension: Secondary | ICD-10-CM | POA: Diagnosis not present

## 2020-11-29 DIAGNOSIS — E039 Hypothyroidism, unspecified: Secondary | ICD-10-CM | POA: Diagnosis not present

## 2020-11-30 DIAGNOSIS — I5031 Acute diastolic (congestive) heart failure: Secondary | ICD-10-CM | POA: Diagnosis not present

## 2020-11-30 DIAGNOSIS — G8191 Hemiplegia, unspecified affecting right dominant side: Secondary | ICD-10-CM | POA: Diagnosis not present

## 2020-11-30 DIAGNOSIS — E039 Hypothyroidism, unspecified: Secondary | ICD-10-CM | POA: Diagnosis not present

## 2020-11-30 DIAGNOSIS — I517 Cardiomegaly: Secondary | ICD-10-CM | POA: Diagnosis not present

## 2020-11-30 DIAGNOSIS — I340 Nonrheumatic mitral (valve) insufficiency: Secondary | ICD-10-CM | POA: Diagnosis not present

## 2020-11-30 DIAGNOSIS — I110 Hypertensive heart disease with heart failure: Secondary | ICD-10-CM | POA: Diagnosis not present

## 2020-11-30 DIAGNOSIS — R4701 Aphasia: Secondary | ICD-10-CM | POA: Diagnosis not present

## 2020-11-30 DIAGNOSIS — R509 Fever, unspecified: Secondary | ICD-10-CM | POA: Diagnosis not present

## 2020-11-30 DIAGNOSIS — I63412 Cerebral infarction due to embolism of left middle cerebral artery: Secondary | ICD-10-CM | POA: Diagnosis not present

## 2020-11-30 DIAGNOSIS — Z20828 Contact with and (suspected) exposure to other viral communicable diseases: Secondary | ICD-10-CM | POA: Diagnosis not present

## 2020-11-30 DIAGNOSIS — R0602 Shortness of breath: Secondary | ICD-10-CM | POA: Diagnosis not present

## 2020-11-30 DIAGNOSIS — I4891 Unspecified atrial fibrillation: Secondary | ICD-10-CM | POA: Diagnosis not present

## 2020-11-30 DIAGNOSIS — R0902 Hypoxemia: Secondary | ICD-10-CM | POA: Diagnosis not present

## 2020-11-30 DIAGNOSIS — J189 Pneumonia, unspecified organism: Secondary | ICD-10-CM | POA: Diagnosis not present

## 2020-11-30 DIAGNOSIS — R7989 Other specified abnormal findings of blood chemistry: Secondary | ICD-10-CM | POA: Diagnosis not present

## 2020-12-01 DIAGNOSIS — I63412 Cerebral infarction due to embolism of left middle cerebral artery: Secondary | ICD-10-CM | POA: Diagnosis not present

## 2020-12-01 DIAGNOSIS — I5031 Acute diastolic (congestive) heart failure: Secondary | ICD-10-CM | POA: Diagnosis not present

## 2020-12-01 DIAGNOSIS — I4891 Unspecified atrial fibrillation: Secondary | ICD-10-CM | POA: Diagnosis not present

## 2020-12-02 ENCOUNTER — Encounter (HOSPITAL_COMMUNITY)
Admission: EM | Disposition: A | Discharge status: Rehabilitation Facility | Payer: Self-pay | Source: Other Acute Inpatient Hospital | Attending: Neurology

## 2020-12-02 ENCOUNTER — Emergency Department (HOSPITAL_COMMUNITY): Payer: Medicare HMO | Admitting: Certified Registered Nurse Anesthetist

## 2020-12-02 ENCOUNTER — Inpatient Hospital Stay (HOSPITAL_COMMUNITY)
Admission: EM | Admit: 2020-12-02 | Discharge: 2020-12-09 | DRG: 023 | Disposition: A | Discharge status: Rehabilitation Facility | Payer: Medicare HMO | Source: Other Acute Inpatient Hospital | Attending: Neurology | Admitting: Neurology

## 2020-12-02 ENCOUNTER — Emergency Department (HOSPITAL_COMMUNITY): Payer: Medicare HMO

## 2020-12-02 ENCOUNTER — Inpatient Hospital Stay (HOSPITAL_COMMUNITY): Payer: Medicare HMO

## 2020-12-02 ENCOUNTER — Other Ambulatory Visit: Payer: Self-pay

## 2020-12-02 ENCOUNTER — Inpatient Hospital Stay (HOSPITAL_COMMUNITY): Admission: EM | Admit: 2020-12-02 | Payer: Medicare HMO | Source: Other Acute Inpatient Hospital | Admitting: Neurology

## 2020-12-02 ENCOUNTER — Inpatient Hospital Stay: Admit: 2020-12-02 | Payer: Medicare HMO | Admitting: Interventional Radiology

## 2020-12-02 DIAGNOSIS — R29810 Facial weakness: Secondary | ICD-10-CM | POA: Diagnosis not present

## 2020-12-02 DIAGNOSIS — I69320 Aphasia following cerebral infarction: Secondary | ICD-10-CM | POA: Diagnosis not present

## 2020-12-02 DIAGNOSIS — Z6841 Body Mass Index (BMI) 40.0 and over, adult: Secondary | ICD-10-CM | POA: Diagnosis not present

## 2020-12-02 DIAGNOSIS — M1990 Unspecified osteoarthritis, unspecified site: Secondary | ICD-10-CM | POA: Diagnosis present

## 2020-12-02 DIAGNOSIS — D72829 Elevated white blood cell count, unspecified: Secondary | ICD-10-CM | POA: Diagnosis not present

## 2020-12-02 DIAGNOSIS — I69391 Dysphagia following cerebral infarction: Secondary | ICD-10-CM | POA: Diagnosis not present

## 2020-12-02 DIAGNOSIS — K219 Gastro-esophageal reflux disease without esophagitis: Secondary | ICD-10-CM | POA: Diagnosis present

## 2020-12-02 DIAGNOSIS — Z79899 Other long term (current) drug therapy: Secondary | ICD-10-CM

## 2020-12-02 DIAGNOSIS — R471 Dysarthria and anarthria: Secondary | ICD-10-CM | POA: Diagnosis present

## 2020-12-02 DIAGNOSIS — Z79890 Hormone replacement therapy (postmenopausal): Secondary | ICD-10-CM

## 2020-12-02 DIAGNOSIS — I129 Hypertensive chronic kidney disease with stage 1 through stage 4 chronic kidney disease, or unspecified chronic kidney disease: Secondary | ICD-10-CM | POA: Diagnosis present

## 2020-12-02 DIAGNOSIS — Z978 Presence of other specified devices: Secondary | ICD-10-CM | POA: Diagnosis not present

## 2020-12-02 DIAGNOSIS — F32A Depression, unspecified: Secondary | ICD-10-CM | POA: Diagnosis present

## 2020-12-02 DIAGNOSIS — J90 Pleural effusion, not elsewhere classified: Secondary | ICD-10-CM | POA: Diagnosis not present

## 2020-12-02 DIAGNOSIS — I517 Cardiomegaly: Secondary | ICD-10-CM | POA: Diagnosis not present

## 2020-12-02 DIAGNOSIS — I6602 Occlusion and stenosis of left middle cerebral artery: Secondary | ICD-10-CM | POA: Diagnosis present

## 2020-12-02 DIAGNOSIS — I63512 Cerebral infarction due to unspecified occlusion or stenosis of left middle cerebral artery: Secondary | ICD-10-CM | POA: Diagnosis not present

## 2020-12-02 DIAGNOSIS — Z20822 Contact with and (suspected) exposure to covid-19: Secondary | ICD-10-CM | POA: Diagnosis present

## 2020-12-02 DIAGNOSIS — Z8673 Personal history of transient ischemic attack (TIA), and cerebral infarction without residual deficits: Secondary | ICD-10-CM

## 2020-12-02 DIAGNOSIS — N182 Chronic kidney disease, stage 2 (mild): Secondary | ICD-10-CM | POA: Diagnosis present

## 2020-12-02 DIAGNOSIS — I4891 Unspecified atrial fibrillation: Secondary | ICD-10-CM | POA: Diagnosis not present

## 2020-12-02 DIAGNOSIS — I4821 Permanent atrial fibrillation: Secondary | ICD-10-CM | POA: Diagnosis not present

## 2020-12-02 DIAGNOSIS — I809 Phlebitis and thrombophlebitis of unspecified site: Secondary | ICD-10-CM

## 2020-12-02 DIAGNOSIS — Z9049 Acquired absence of other specified parts of digestive tract: Secondary | ICD-10-CM

## 2020-12-02 DIAGNOSIS — I63412 Cerebral infarction due to embolism of left middle cerebral artery: Secondary | ICD-10-CM | POA: Diagnosis not present

## 2020-12-02 DIAGNOSIS — D638 Anemia in other chronic diseases classified elsewhere: Secondary | ICD-10-CM | POA: Diagnosis present

## 2020-12-02 DIAGNOSIS — I959 Hypotension, unspecified: Secondary | ICD-10-CM | POA: Diagnosis not present

## 2020-12-02 DIAGNOSIS — R0902 Hypoxemia: Secondary | ICD-10-CM

## 2020-12-02 DIAGNOSIS — J9601 Acute respiratory failure with hypoxia: Secondary | ICD-10-CM | POA: Diagnosis not present

## 2020-12-02 DIAGNOSIS — I639 Cerebral infarction, unspecified: Secondary | ICD-10-CM | POA: Diagnosis not present

## 2020-12-02 DIAGNOSIS — R4781 Slurred speech: Secondary | ICD-10-CM | POA: Diagnosis not present

## 2020-12-02 DIAGNOSIS — G8191 Hemiplegia, unspecified affecting right dominant side: Secondary | ICD-10-CM | POA: Diagnosis present

## 2020-12-02 DIAGNOSIS — R29714 NIHSS score 14: Secondary | ICD-10-CM | POA: Diagnosis present

## 2020-12-02 DIAGNOSIS — R000 Tachycardia, unspecified: Secondary | ICD-10-CM | POA: Diagnosis not present

## 2020-12-02 DIAGNOSIS — E1122 Type 2 diabetes mellitus with diabetic chronic kidney disease: Secondary | ICD-10-CM | POA: Diagnosis not present

## 2020-12-02 DIAGNOSIS — Z7902 Long term (current) use of antithrombotics/antiplatelets: Secondary | ICD-10-CM

## 2020-12-02 DIAGNOSIS — Z4682 Encounter for fitting and adjustment of non-vascular catheter: Secondary | ICD-10-CM | POA: Diagnosis not present

## 2020-12-02 DIAGNOSIS — E785 Hyperlipidemia, unspecified: Secondary | ICD-10-CM | POA: Diagnosis not present

## 2020-12-02 DIAGNOSIS — R1312 Dysphagia, oropharyngeal phase: Secondary | ICD-10-CM | POA: Diagnosis not present

## 2020-12-02 DIAGNOSIS — I808 Phlebitis and thrombophlebitis of other sites: Secondary | ICD-10-CM | POA: Diagnosis not present

## 2020-12-02 DIAGNOSIS — Z96652 Presence of left artificial knee joint: Secondary | ICD-10-CM | POA: Diagnosis present

## 2020-12-02 DIAGNOSIS — R451 Restlessness and agitation: Secondary | ICD-10-CM | POA: Diagnosis not present

## 2020-12-02 DIAGNOSIS — Z4659 Encounter for fitting and adjustment of other gastrointestinal appliance and device: Secondary | ICD-10-CM

## 2020-12-02 DIAGNOSIS — E039 Hypothyroidism, unspecified: Secondary | ICD-10-CM | POA: Diagnosis not present

## 2020-12-02 DIAGNOSIS — R7989 Other specified abnormal findings of blood chemistry: Secondary | ICD-10-CM | POA: Diagnosis not present

## 2020-12-02 DIAGNOSIS — J9620 Acute and chronic respiratory failure, unspecified whether with hypoxia or hypercapnia: Secondary | ICD-10-CM | POA: Diagnosis not present

## 2020-12-02 DIAGNOSIS — I5031 Acute diastolic (congestive) heart failure: Secondary | ICD-10-CM | POA: Diagnosis not present

## 2020-12-02 DIAGNOSIS — R1310 Dysphagia, unspecified: Secondary | ICD-10-CM | POA: Diagnosis present

## 2020-12-02 DIAGNOSIS — I429 Cardiomyopathy, unspecified: Secondary | ICD-10-CM | POA: Diagnosis not present

## 2020-12-02 DIAGNOSIS — I6389 Other cerebral infarction: Secondary | ICD-10-CM | POA: Diagnosis not present

## 2020-12-02 DIAGNOSIS — Z86718 Personal history of other venous thrombosis and embolism: Secondary | ICD-10-CM

## 2020-12-02 DIAGNOSIS — M79605 Pain in left leg: Secondary | ICD-10-CM | POA: Diagnosis not present

## 2020-12-02 DIAGNOSIS — I10 Essential (primary) hypertension: Secondary | ICD-10-CM | POA: Diagnosis not present

## 2020-12-02 DIAGNOSIS — J811 Chronic pulmonary edema: Secondary | ICD-10-CM | POA: Diagnosis not present

## 2020-12-02 DIAGNOSIS — N179 Acute kidney failure, unspecified: Secondary | ICD-10-CM | POA: Diagnosis not present

## 2020-12-02 DIAGNOSIS — I6523 Occlusion and stenosis of bilateral carotid arteries: Secondary | ICD-10-CM | POA: Diagnosis not present

## 2020-12-02 DIAGNOSIS — I482 Chronic atrial fibrillation, unspecified: Secondary | ICD-10-CM | POA: Diagnosis present

## 2020-12-02 DIAGNOSIS — Z87891 Personal history of nicotine dependence: Secondary | ICD-10-CM

## 2020-12-02 DIAGNOSIS — R4701 Aphasia: Secondary | ICD-10-CM | POA: Diagnosis present

## 2020-12-02 DIAGNOSIS — N390 Urinary tract infection, site not specified: Secondary | ICD-10-CM | POA: Diagnosis not present

## 2020-12-02 DIAGNOSIS — E034 Atrophy of thyroid (acquired): Secondary | ICD-10-CM | POA: Diagnosis not present

## 2020-12-02 DIAGNOSIS — Z22322 Carrier or suspected carrier of Methicillin resistant Staphylococcus aureus: Secondary | ICD-10-CM

## 2020-12-02 DIAGNOSIS — J9690 Respiratory failure, unspecified, unspecified whether with hypoxia or hypercapnia: Secondary | ICD-10-CM | POA: Diagnosis not present

## 2020-12-02 DIAGNOSIS — J9811 Atelectasis: Secondary | ICD-10-CM | POA: Diagnosis not present

## 2020-12-02 HISTORY — PX: IR CT HEAD LTD: IMG2386

## 2020-12-02 HISTORY — PX: IR PERCUTANEOUS ART THROMBECTOMY/INFUSION INTRACRANIAL INC DIAG ANGIO: IMG6087

## 2020-12-02 HISTORY — PX: RADIOLOGY WITH ANESTHESIA: SHX6223

## 2020-12-02 LAB — POCT I-STAT 7, (LYTES, BLD GAS, ICA,H+H)
Acid-base deficit: 1 mmol/L (ref 0.0–2.0)
Bicarbonate: 26.3 mmol/L (ref 20.0–28.0)
Calcium, Ion: 1.21 mmol/L (ref 1.15–1.40)
HCT: 32 % — ABNORMAL LOW (ref 36.0–46.0)
Hemoglobin: 10.9 g/dL — ABNORMAL LOW (ref 12.0–15.0)
O2 Saturation: 99 %
Patient temperature: 97.2
Potassium: 4.5 mmol/L (ref 3.5–5.1)
Sodium: 140 mmol/L (ref 135–145)
TCO2: 28 mmol/L (ref 22–32)
pCO2 arterial: 53.6 mmHg — ABNORMAL HIGH (ref 32.0–48.0)
pH, Arterial: 7.294 — ABNORMAL LOW (ref 7.350–7.450)
pO2, Arterial: 172 mmHg — ABNORMAL HIGH (ref 83.0–108.0)

## 2020-12-02 LAB — URINALYSIS, COMPLETE (UACMP) WITH MICROSCOPIC
Bilirubin Urine: NEGATIVE
Glucose, UA: NEGATIVE mg/dL
Hgb urine dipstick: NEGATIVE
Ketones, ur: NEGATIVE mg/dL
Nitrite: POSITIVE — AB
Protein, ur: NEGATIVE mg/dL
Specific Gravity, Urine: 1.028 (ref 1.005–1.030)
WBC, UA: >50 WBC/hpf — ABNORMAL HIGH (ref 0–5)
pH: 5 (ref 5.0–8.0)

## 2020-12-02 LAB — RESP PANEL BY RT-PCR (FLU A&B, COVID) ARPGX2
Influenza A by PCR: NEGATIVE
Influenza B by PCR: NEGATIVE
SARS Coronavirus 2 by RT PCR: NEGATIVE

## 2020-12-02 LAB — RAPID URINE DRUG SCREEN, HOSP PERFORMED
Amphetamines: NOT DETECTED
Barbiturates: NOT DETECTED
Benzodiazepines: NOT DETECTED
Cocaine: NOT DETECTED
Opiates: NOT DETECTED
Tetrahydrocannabinol: NOT DETECTED

## 2020-12-02 LAB — MRSA NEXT GEN BY PCR, NASAL: MRSA by PCR Next Gen: DETECTED — AB

## 2020-12-02 SURGERY — IR WITH ANESTHESIA
Anesthesia: General

## 2020-12-02 MED ORDER — ONDANSETRON HCL 4 MG/2ML IJ SOLN
INTRAMUSCULAR | Status: DC | PRN
  Administered 2020-12-02: 4 mg via INTRAVENOUS

## 2020-12-02 MED ORDER — VERAPAMIL HCL 2.5 MG/ML IV SOLN
INTRAVENOUS | Status: AC
  Filled 2020-12-02: qty 2

## 2020-12-02 MED ORDER — STROKE: EARLY STAGES OF RECOVERY BOOK
Freq: Once | Status: DC
  Filled 2020-12-02: qty 1

## 2020-12-02 MED ORDER — CLOPIDOGREL BISULFATE 300 MG PO TABS
ORAL_TABLET | ORAL | Status: AC
  Filled 2020-12-02: qty 1

## 2020-12-02 MED ORDER — EPTIFIBATIDE 20 MG/10ML IV SOLN
INTRAVENOUS | Status: AC
  Filled 2020-12-02: qty 10

## 2020-12-02 MED ORDER — PROPOFOL 1000 MG/100ML IV EMUL
5.0000 ug/kg/min | INTRAVENOUS | Status: DC
Start: 2020-12-02 — End: 2020-12-04
  Administered 2020-12-02: 20 ug/kg/min via INTRAVENOUS
  Administered 2020-12-03: 10 ug/kg/min via INTRAVENOUS
  Filled 2020-12-02: qty 100

## 2020-12-02 MED ORDER — FENTANYL CITRATE (PF) 100 MCG/2ML IJ SOLN
25.0000 ug | INTRAMUSCULAR | Status: DC | PRN
  Administered 2020-12-02: 50 ug via INTRAVENOUS
  Filled 2020-12-02: qty 2

## 2020-12-02 MED ORDER — ACETAMINOPHEN 325 MG PO TABS: 650.0000 mg | ORAL_TABLET | ORAL | Status: DC | PRN

## 2020-12-02 MED ORDER — SODIUM CHLORIDE 0.9 % IV SOLN
INTRAVENOUS | Status: DC
Start: 2020-12-02 — End: 2020-12-04

## 2020-12-02 MED ORDER — CLEVIDIPINE BUTYRATE 0.5 MG/ML IV EMUL
INTRAVENOUS | Status: AC
  Filled 2020-12-02: qty 100

## 2020-12-02 MED ORDER — NITROGLYCERIN 1 MG/10 ML FOR IR/CATH LAB
INTRA_ARTERIAL | Status: AC
  Filled 2020-12-02: qty 10

## 2020-12-02 MED ORDER — NITROGLYCERIN 1 MG/10 ML FOR IR/CATH LAB
INTRA_ARTERIAL | Status: AC | PRN
  Administered 2020-12-02: 25 ug via INTRA_ARTERIAL

## 2020-12-02 MED ORDER — CANGRELOR TETRASODIUM 50 MG IV SOLR
INTRAVENOUS | Status: AC
  Filled 2020-12-02: qty 50

## 2020-12-02 MED ORDER — PROPOFOL 10 MG/ML IV BOLUS
INTRAVENOUS | Status: DC | PRN
  Administered 2020-12-02: 140 mg via INTRAVENOUS

## 2020-12-02 MED ORDER — PANTOPRAZOLE SODIUM 40 MG IV SOLR
40.0000 mg | Freq: Every day | INTRAVENOUS | Status: DC
  Administered 2020-12-02 – 2020-12-04 (×3): 40 mg via INTRAVENOUS
  Filled 2020-12-02 (×3): qty 40

## 2020-12-02 MED ORDER — ACETAMINOPHEN 650 MG RE SUPP: 650.0000 mg | RECTAL | Status: DC | PRN

## 2020-12-02 MED ORDER — PHENYLEPHRINE HCL-NACL 10-0.9 MG/250ML-% IV SOLN
INTRAVENOUS | Status: DC | PRN
  Administered 2020-12-02: 50 ug/min via INTRAVENOUS

## 2020-12-02 MED ORDER — LIDOCAINE HCL (CARDIAC) PF 100 MG/5ML IV SOSY: PREFILLED_SYRINGE | INTRAVENOUS | Status: DC | PRN

## 2020-12-02 MED ORDER — ASPIRIN 81 MG PO CHEW
CHEWABLE_TABLET | ORAL | Status: AC
  Filled 2020-12-02: qty 1

## 2020-12-02 MED ORDER — MIDAZOLAM HCL 2 MG/2ML IJ SOLN: 1.0000 mg | INTRAMUSCULAR | Status: DC | PRN

## 2020-12-02 MED ORDER — LIDOCAINE HCL (CARDIAC) PF 100 MG/5ML IV SOSY
PREFILLED_SYRINGE | INTRAVENOUS | Status: DC | PRN
  Administered 2020-12-02: 40 mg via INTRAVENOUS

## 2020-12-02 MED ORDER — IOHEXOL 300 MG/ML  SOLN
150.0000 mL | Freq: Once | INTRAMUSCULAR | Status: AC | PRN
  Administered 2020-12-02: 80 mL via INTRA_ARTERIAL

## 2020-12-02 MED ORDER — ACETAMINOPHEN 160 MG/5ML PO SOLN: 650.0000 mg | ORAL | Status: DC | PRN

## 2020-12-02 MED ORDER — TIROFIBAN HCL IN NACL 5-0.9 MG/100ML-% IV SOLN
INTRAVENOUS | Status: AC
  Filled 2020-12-02: qty 100

## 2020-12-02 MED ORDER — SUCCINYLCHOLINE CHLORIDE 20 MG/ML IJ SOLN
INTRAMUSCULAR | Status: DC | PRN
  Administered 2020-12-02: 120 mg via INTRAVENOUS

## 2020-12-02 MED ORDER — LIDOCAINE HCL (PF) 1 % IJ SOLN
INTRAMUSCULAR | Status: AC | PRN
  Administered 2020-12-02: 10 mL via SUBCUTANEOUS

## 2020-12-02 MED ORDER — TICAGRELOR 90 MG PO TABS
ORAL_TABLET | ORAL | Status: AC
  Filled 2020-12-02: qty 2

## 2020-12-02 MED ORDER — LACTATED RINGERS IV SOLN: INTRAVENOUS | Status: DC | PRN

## 2020-12-02 MED ORDER — CEFAZOLIN SODIUM-DEXTROSE 2-4 GM/100ML-% IV SOLN
INTRAVENOUS | Status: AC
  Filled 2020-12-02: qty 100

## 2020-12-02 MED ORDER — CLEVIDIPINE BUTYRATE 0.5 MG/ML IV EMUL
0.0000 mg/h | INTRAVENOUS | Status: DC
  Administered 2020-12-02 – 2020-12-03 (×2): 4 mg/h via INTRAVENOUS
  Filled 2020-12-02 (×2): qty 50

## 2020-12-02 MED ORDER — PROPOFOL 500 MG/50ML IV EMUL
INTRAVENOUS | Status: DC | PRN
  Administered 2020-12-02 (×2): 50 ug/kg/min via INTRAVENOUS

## 2020-12-02 MED ORDER — ACETAMINOPHEN 650 MG RE SUPP
650.0000 mg | RECTAL | Status: DC | PRN
Start: 2020-12-02 — End: 2020-12-09

## 2020-12-02 MED ORDER — ACETAMINOPHEN 160 MG/5ML PO SOLN
650.0000 mg | ORAL | Status: DC | PRN
  Administered 2020-12-05 – 2020-12-06 (×5): 650 mg
  Filled 2020-12-02 (×5): qty 20.3

## 2020-12-02 MED ORDER — ROCURONIUM BROMIDE 100 MG/10ML IV SOLN
INTRAVENOUS | Status: DC | PRN
  Administered 2020-12-02: 100 mg via INTRAVENOUS

## 2020-12-02 MED ORDER — PHENYLEPHRINE HCL (PRESSORS) 10 MG/ML IV SOLN
INTRAVENOUS | Status: DC | PRN
  Administered 2020-12-02: 120 ug via INTRAVENOUS
  Administered 2020-12-02: 160 ug via INTRAVENOUS
  Administered 2020-12-02: 120 ug via INTRAVENOUS

## 2020-12-02 MED ORDER — SENNOSIDES-DOCUSATE SODIUM 8.6-50 MG PO TABS
1.0000 | ORAL_TABLET | Freq: Every evening | ORAL | Status: DC | PRN
Start: 2020-12-02 — End: 2020-12-09

## 2020-12-02 MED ORDER — IPRATROPIUM-ALBUTEROL 0.5-2.5 (3) MG/3ML IN SOLN
3.0000 mL | Freq: Four times a day (QID) | RESPIRATORY_TRACT | Status: DC
  Administered 2020-12-02 – 2020-12-04 (×9): 3 mL via RESPIRATORY_TRACT
  Filled 2020-12-02 (×8): qty 3

## 2020-12-02 MED ORDER — FENTANYL CITRATE (PF) 250 MCG/5ML IJ SOLN
INTRAMUSCULAR | Status: AC
  Filled 2020-12-02: qty 5

## 2020-12-02 MED ORDER — SODIUM CHLORIDE 0.9 % IV SOLN: INTRAVENOUS | Status: DC

## 2020-12-02 MED ORDER — CEFAZOLIN SODIUM-DEXTROSE 2-3 GM-%(50ML) IV SOLR
INTRAVENOUS | Status: DC | PRN
  Administered 2020-12-02: 2 g via INTRAVENOUS

## 2020-12-02 MED ORDER — ACETAMINOPHEN 325 MG PO TABS
650.0000 mg | ORAL_TABLET | ORAL | Status: DC | PRN
  Administered 2020-12-07 – 2020-12-09 (×3): 650 mg via ORAL
  Filled 2020-12-02 (×3): qty 2

## 2020-12-02 MED ORDER — FENTANYL CITRATE (PF) 100 MCG/2ML IJ SOLN: 25.0000 ug | INTRAMUSCULAR | Status: DC | PRN

## 2020-12-02 NOTE — H&P (Addendum)
Stroke Neurology H $ P Note  Consult Requested by: Dr. Gustavus Messing  Reason for Consult: Code Stroke  Consult Date:  12/02/20  The history was obtained from chart review  History of Present Illness:  Candice Perez is an 70 y.o. female with hypertension, hyperlipidemia, prior TIA, and history of DVT who was admitted to [facility] for afib RVR. She had been placed on eliquis.   At 1200 today, she was noted to have acute onset aphasia and right sided weakness. Code stroke was activated at [facility] and CTA revealed LVO.  With occlusion of the superior division of the left middle cerebral artery.  She has been transferred to [facility] for emergent mechanical thrombectomy.  She received a dose of Eliquis Candice morning.  Her neurological exam upon arrival is consistent with aphasia and mild right hemiparesis with NIH stroke scale of 14.  I spoke to the Perez's husband over the phone and obtained consent for emergent thrombectomy and also discussed the case with Dr. Estanislado Pandy and Perez was then sent for mechanical thrombectomy.   Time last known well: 12pm tPA Given: No MRS:  ? NIHSS:  14  Past Medical History:  Diagnosis Date   Arthritis    DVT (deep venous thrombosis) (HCC)    Edema    GERD (gastroesophageal reflux disease)    OCCASIONAL - PRILOSEC IF NEEDED   Hyperlipidemia    Hypertension    no meds now   Hypothyroidism    Varicose veins      Past Surgical History:  Procedure Laterality Date   CHOLECYSTECTOMY     Laparoscopic   CYSTOCELE REPAIR     HERNIA REPAIR     TONSILLECTOMY     TOTAL KNEE ARTHROPLASTY Left 08/26/2013   Procedure: LEFT TOTAL KNEE REVISION;  Surgeon: Mauri Pole, MD;  Location: [facility];  Service: Orthopedics;  Laterality: Left;   VARICOSE VEIN SURGERY      Family History  Problem Relation Age of Onset   Congestive Heart Failure Mother    Cancer Mother        tongue/sinus   Cancer Father        "in his back"   Other Father         Varicose veins, Arteriosclerosis     Social History:  reports that she quit smoking about 10 years ago. Her smoking use included cigarettes. She has never used smokeless tobacco. She reports that she does not drink alcohol and does not use drugs.  Review of Systems: Complete ROS was unable to be obtained due to the emergent nature of her condition  Allergies: No Known Allergies    Current Outpatient Medications on File Prior to Encounter  Medication Sig Dispense Refill   buPROPion (WELLBUTRIN XL) 150 MG 24 hr tablet Take 150 mg by mouth daily.     clopidogrel (PLAVIX) 75 MG tablet Take 1 tablet (75 mg total) by mouth daily. 90 tablet 3   levothyroxine (SYNTHROID, LEVOTHROID) 100 MCG tablet Take 100 mcg by mouth daily before breakfast.     losartan-hydrochlorothiazide (HYZAAR) 100-25 MG per tablet Take 1 tablet by mouth every morning.       Test Results: CBC: No results for input(s): WBC, NEUTROABS, HGB, HCT, MCV, PLT in the last 168 hours. Basic Metabolic Panel: No results for input(s): NA, K, CL, CO2, GLUCOSE, BUN, CREATININE, CALCIUM, MG, PHOS in the last 168 hours. Liver Function Tests: No results for input(s): AST, ALT, ALKPHOS, BILITOT, PROT, ALBUMIN in  the last 168 hours. No results for input(s): LIPASE, AMYLASE in the last 168 hours. No results for input(s): AMMONIA in the last 168 hours. Coagulation Studies: No results for input(s): LABPROT, INR in the last 72 hours. Cardiac Enzymes: No results for input(s): CKTOTAL, CKMB, CKMBINDEX, TROPONINI in the last 168 hours. BNP: Invalid input(s): POCBNP CBG: No results for input(s): GLUCAP in the last 168 hours. Urinalysis: No results for input(s): COLORURINE, LABSPEC, PHURINE, GLUCOSEU, HGBUR, BILIRUBINUR, KETONESUR, PROTEINUR, UROBILINOGEN, NITRITE, LEUKOCYTESUR in the last 168 hours.  Invalid input(s): APPERANCEUR   Lipid Panel: No results found for: CHOL, TRIG, HDL, CHOLHDL, VLDL, LDLCALC HgbA1c: Lab Results  Component Value  Date   HGBA1C 6.0 (H) 06/15/2017   Urine Drug Screen: No results found for: LABOPIA, COCAINSCRNUR, LABBENZ, AMPHETMU, THCU, LABBARB  Alcohol Level: No results for input(s): ETH in the last 168 hours.  No results found.   Physical Examination:   General - Well nourished, well developed elderly Caucasian lady, in no apparent distress HENT: head atraumatic, MMM Eyes: no scleral icterus or conjunctival injection. Neurologic exam  Mental Status -  Awake.  Awake alert.  Moderate expressive aphasia and speak a few words but not sentences.  Can understand simple midline and one-step commands only.  Unable to name repeat.  Cranial Nerves II - XII - II - Visual field intact OU. III, IV, VI - Extraocular movements intact. V - Facial sensation intact bilaterally. VII - right facial droop VIII - Hearing intact bilaterally. XII - Tongue protrusion intact.  Motor Strength - right upper and lower extremity mild weakness with drift.  Right grip weakness.  Normal antigravity strength on the left.  Sensory - diminished sensation on the right  Gait and Station - deferred.   NIH stroke scale 14.  Premorbid modified Rankin 1  Assessment:  Candice Perez is a 70 y.o. female with history of hypertension, hyperlipidemia, and prior TIA who presented as a code stroke from [facility] after being found with right sided weakness and aphasia earlier today while admitted for afib RVR. CTA revealed a left MCA LVO. tPA was not given as she was on eliquis. She was take for thrombectomy.  Left MCA due to superior division left middle cerebral artery occlusion likely from atrial fibrillation despite anticoagulation with Eliquis  History of TIA in 2019 at which time she was placed on plavix. Vascular imaging at that time showed carotid stenosis and chronic small vessel disease Stroke Risk factors include obesity, age, hypertension Head CT in AM MRI brain Echocardiogram Tele monitoring Frequent  neuro checks A1C, lipid panel, UDS, CBC, CMP, ETOH PT/OT/ST Hold anticoagulation until cleared by IR Systolic blood pressure goal 130-154mmHg. PRN cleviprex drip if needed Maintain neuro-protective measures Admit to neuro ICU for ongoing monitoring.   Hyperlipidemia Start crestor 20mg  daily  Hospital day # 0   Thank you for Candice consultation and allowing [place] to participate in the care of Candice Perez.  Mitzi Hansen, MD Internal Medicine Resident PGY-3 [facility] Pager: [phone] 12/02/2020 6:24 PM    Stroke MD note: I have personally obtained history,examined Candice Perez, reviewed notes, independently viewed imaging studies, participated in medical decision making and plan of care.ROS completed by me personally and pertinent positives fully documented  I have made any additions or clarifications directly to the above note. Agree with note above.  Was admitted to [facility] for atrial fibrillation with rapid heart rate and had received Eliquis Candice morning but then developed  sudden onset of expressive aphasia and CT angiogram showed left superior division middle cerebral artery occlusion and has been transferred to [facility] for emergent thrombectomy.  I spoke to the Perez's husband over the phone and obtained consent for emergent thrombectomy after discussing risk-benefit and also discussed the case with Dr. Estanislado Pandy who is in agreement with treatment plan.  Perez will be transferred to IR suite for emergent mechanical thrombectomy and subsequently will be admitted to neurological intensive care unit where blood pressure will be tightly controlled with systolic range 174-081 for first 24 hours.  She will be extubated as tolerated and critical care team will follow.  Repeat brain imaging tomorrow and if no bleed may resume anticoagulation. Candice Perez is critically ill and at significant risk of neurological worsening, death and care  requires constant monitoring of vital signs, hemodynamics,respiratory and cardiac monitoring, extensive review of multiple databases, frequent neurological assessment, discussion with family, other specialists and medical decision making of high complexity.I have made any additions or clarifications directly to the above note.Candice critical care time does not reflect procedure time, or teaching time or supervisory time of PA/NP/Med Resident etc but could involve care discussion time.  I spent 40 minutes of neurocritical care time  in the care of  Candice Perez.      Antony Contras, MD Medical Director [place] Pager: [phone] 12/02/2020 6:35 PM  To contact Stroke Continuity provider, please refer to [email or web address] After hours, contact General Neurology

## 2020-12-02 NOTE — Sedation Documentation (Addendum)
6 Fr angioseal closure device failed to deploy, holding manual pressure at this time, minimum 20 min

## 2020-12-02 NOTE — Consult Note (Deleted)
Stroke Neurology Consultation Note  Consult Requested by: Dr. Gustavus Messing  Reason for Consult: Code Stroke  Consult Date:  12/02/20  The history was obtained from chart review  History of Present Illness:  Candice Perez is an 70 y.o. female with hypertension, hyperlipidemia, prior TIA, and history of DVT who was admitted to [facility] for afib RVR. She had been placed on eliquis.   At 1200 today, she was noted to have acute onset aphasia and right sided weakness. Code stroke was activated at [facility] and CTA revealed LVO.   Time last known well: 12pm tPA Given: No MRS:  ? NIHSS:  14  Past Medical History:  Diagnosis Date   Arthritis    DVT (deep venous thrombosis) (HCC)    Edema    GERD (gastroesophageal reflux disease)    OCCASIONAL - PRILOSEC IF NEEDED   Hyperlipidemia    Hypertension    no meds now   Hypothyroidism    Varicose veins      Past Surgical History:  Procedure Laterality Date   CHOLECYSTECTOMY     Laparoscopic   CYSTOCELE REPAIR     HERNIA REPAIR     TONSILLECTOMY     TOTAL KNEE ARTHROPLASTY Left 08/26/2013   Procedure: LEFT TOTAL KNEE REVISION;  Surgeon: Mauri Pole, MD;  Location: [facility];  Service: Orthopedics;  Laterality: Left;   VARICOSE VEIN SURGERY      Family History  Problem Relation Age of Onset   Congestive Heart Failure Mother    Cancer Mother        tongue/sinus   Cancer Father        "in his back"   Other Father        Varicose veins, Arteriosclerosis     Social History:  reports that she quit smoking about 10 years ago. Her smoking use included cigarettes. She has never used smokeless tobacco. She reports that she does not drink alcohol and does not use drugs.  Review of Systems: Complete ROS was unable to be obtained due to the emergent nature of her condition  Allergies: No Known Allergies    Current Outpatient Medications on File Prior to Encounter  Medication Sig Dispense Refill   buPROPion (WELLBUTRIN  XL) 150 MG 24 hr tablet Take 150 mg by mouth daily.     clopidogrel (PLAVIX) 75 MG tablet Take 1 tablet (75 mg total) by mouth daily. 90 tablet 3   levothyroxine (SYNTHROID, LEVOTHROID) 100 MCG tablet Take 100 mcg by mouth daily before breakfast.     losartan-hydrochlorothiazide (HYZAAR) 100-25 MG per tablet Take 1 tablet by mouth every morning.       Test Results: CBC: No results for input(s): WBC, NEUTROABS, HGB, HCT, MCV, PLT in the last 168 hours. Basic Metabolic Panel: No results for input(s): NA, K, CL, CO2, GLUCOSE, BUN, CREATININE, CALCIUM, MG, PHOS in the last 168 hours. Liver Function Tests: No results for input(s): AST, ALT, ALKPHOS, BILITOT, PROT, ALBUMIN in the last 168 hours. No results for input(s): LIPASE, AMYLASE in the last 168 hours. No results for input(s): AMMONIA in the last 168 hours. Coagulation Studies: No results for input(s): LABPROT, INR in the last 72 hours. Cardiac Enzymes: No results for input(s): CKTOTAL, CKMB, CKMBINDEX, TROPONINI in the last 168 hours. BNP: Invalid input(s): POCBNP CBG: No results for input(s): GLUCAP in the last 168 hours. Urinalysis: No results for input(s): COLORURINE, LABSPEC, PHURINE, GLUCOSEU, HGBUR, BILIRUBINUR, KETONESUR, PROTEINUR, UROBILINOGEN, NITRITE, LEUKOCYTESUR in the last 168  hours.  Invalid input(s): APPERANCEUR   Lipid Panel: No results found for: CHOL, TRIG, HDL, CHOLHDL, VLDL, LDLCALC HgbA1c: Lab Results  Component Value Date   HGBA1C 6.0 (H) 06/15/2017   Urine Drug Screen: No results found for: LABOPIA, COCAINSCRNUR, LABBENZ, AMPHETMU, THCU, LABBARB  Alcohol Level: No results for input(s): ETH in the last 168 hours.  No results found.   Physical Examination:   General - Well nourished, well developed, in no apparent distress HENT: head atraumatic, MMM Eyes: no scleral icterus or conjunctival injection. Neurologic exam  Mental Status -  Awake. Unable to further assess due to expressive aphasia.    Cranial Nerves II - XII - II - Visual field intact OU. III, IV, VI - Extraocular movements intact. V - Facial sensation intact bilaterally. VII - right facial droop VIII - Hearing intact bilaterally. XII - Tongue protrusion intact.  Motor Strength - right upper and lower extremity weakness  Sensory - diminished sensation on the right  Gait and Station - deferred.   Assessment:  [name] is a 70 y.o. female with history of hypertension, hyperlipidemia, and prior TIA who presented as a code stroke from [facility] after being found with right sided weakness and aphasia earlier today while admitted for afib RVR. CTA revealed a left MCA LVO. tPA was not given as she was on eliquis. She was take for thrombectomy.  Left MCA LVO s/p thrombectomy History of TIA in 2019 at which time she was placed on plavix. Vascular imaging at that time showed carotid stenosis and chronic small vessel disease Stroke Risk factors include obesity, age, hypertension Head CT in AM MRI brain Echocardiogram Tele monitoring Frequent neuro checks A1C, lipid panel, UDS, CBC, CMP, ETOH PT/OT/ST Hold anticoagulation until cleared by IR Systolic blood pressure goal 130-147mmHg. PRN cleviprex drip if needed Maintain neuro-protective measures Admit to neuro ICU for ongoing monitoring.   Hyperlipidemia Start crestor 20mg  daily  Hospital day # 0   Thank you for this consultation and allowing [place] to participate in the care of this patient.  Mitzi Hansen, MD Internal Medicine Resident PGY-3 [facility] Pager: [phone] 12/02/2020 5:20 PM     To contact Stroke Continuity provider, please refer to [email or web address] After hours, contact General Neurology

## 2020-12-02 NOTE — Progress Notes (Signed)
eLink Physician-Brief Progress Note Patient Name: Candice Perez DOB: Oct 21, 1950 MRN: [id number]   Date of Service  12/02/2020  HPI/Events of Note  Agitation - Nursing request for Propofol IV infusion.  eICU Interventions  Plan: Propofol IV infusion. Titrate to RASS = 0 to -1.     Intervention Category Major Interventions: Delirium, psychosis, severe agitation - evaluation and management  Bertin Inabinet Eugene 12/02/2020, 7:44 PM

## 2020-12-02 NOTE — Progress Notes (Signed)
Patient ID: Candice Perez, female   DOB: [birth year], 70 y.o.   MRN: [id number] INR. [place] MRS 0 [id number]. New onset of global aphasia and Rt sided weakness. CT Brain No ICH ASPECTS 1q0  CTA occluded superior  division  of  Lt MCA . On eliquis. Endovascular treatment D./W spouse in a 3 way call. Procedure,reasons and alternatives reviewed. Risks of ICH of 10 % ,worsening neuro deficits ,death andinability to revascularize reviewed.  Spouse expressed understanding and provided consent to the treatment. S.Omie Ferger MD.

## 2020-12-02 NOTE — Sedation Documentation (Signed)
Patient transported to 4N ICU with CRNA. Nursing staff at the bedside to receive patient. Groin site reassessed. Clean, dry and intact, no drainage noted from dressing. Soft to touch upon palpation, no hematoma noted. Palpable +2 pulses distal

## 2020-12-02 NOTE — Anesthesia Preprocedure Evaluation (Addendum)
Anesthesia Evaluation  Patient identified by MRN, date of birth, ID band Patient confused    Reviewed: Unable to perform ROS - Chart review onlyPreop documentation limited or incomplete due to emergent nature of procedure.  Airway Mallampati: III  TM Distance: >3 FB     Dental  (+) Teeth Intact, Dental Advisory Given   Pulmonary    Pulmonary exam normal        Cardiovascular hypertension,  Rhythm:Regular Rate:Normal     Neuro/Psych CVA    GI/Hepatic Neg liver ROS, GERD  ,  Endo/Other    Renal/GU negative Renal ROS     Musculoskeletal   Abdominal (+) + obese,   Peds  Hematology   Anesthesia Other Findings   Reproductive/Obstetrics                            Anesthesia Physical Anesthesia Plan  ASA: 3 and emergent  Anesthesia Plan: General   Post-op Pain Management:    Induction: Intravenous, Rapid sequence and Cricoid pressure planned  PONV Risk Score and Plan: 3 and Ondansetron and Treatment may vary due to age or medical condition  Airway Management Planned: Oral ETT  Additional Equipment: Arterial line  Intra-op Plan:   Post-operative Plan: Possible Post-op intubation/ventilation  Informed Consent: I have reviewed the patients History and Physical, chart, labs and discussed the procedure including the risks, benefits and alternatives for the proposed anesthesia with the patient or authorized representative who has indicated his/her understanding and acceptance.     History available from chart only and Only emergency history available  Plan Discussed with: CRNA  Anesthesia Plan Comments:         Anesthesia Quick Evaluation

## 2020-12-02 NOTE — Transfer of Care (Addendum)
Immediate Anesthesia Transfer of Care Note  Patient: Candice Perez  Procedure(s) Performed: IR WITH ANESTHESIA  Patient Location: ICU  Anesthesia Type:General  Level of Consciousness: sedated, unresponsive and Patient remains intubated per anesthesia plan  Airway & Oxygen Therapy: Patient remains intubated per anesthesia plan and Patient placed on Ventilator (see vital sign flow sheet for setting)  Post-op Assessment: Report given to RN and Post -op Vital signs reviewed and stable  Post vital signs: Reviewed and stable  Last Vitals:  Vitals Value Taken Time  BP 146/74   Temp 36.2 C 12/02/20 1830  Pulse 113 12/02/20 1842  Resp 17 12/02/20 1842  SpO2 97 % 12/02/20 1842  Vitals shown include unvalidated device data.  Last Pain:  Vitals:   12/02/20 1830  TempSrc: Axillary         Complications: No notable events documented.

## 2020-12-02 NOTE — Code Documentation (Signed)
Stroke Response Nurse Documentation Code Documentation  Candice Perez is a 70 y.o. female arriving to [name] ED via [facility] on 12/02/2020 with past medical hx of Afib RVR started on Eliquis at [place]. Code stroke was activated by [name] ICU after patient was noted to have a sudden onset of trouble speaking with right sided weakness at 1200.   On Eliquis daily and took this morning.   Stroke team at the bedside on patient arrival. Patient to CT with team. NIHSS 14, see documentation for details and code stroke times. Patient with disoriented, not following commands, right hemianopia, right facial droop, right arm weakness, right leg weakness, right decreased sensation, Global aphasia , and dysarthria  on exam. Patient is not a candidate for tPA due to being on Eliquis.   Care/Plan: Patient taken directly to IR. Bedside handoff with IR RN Marylyn Ishihara.    Kathrin Greathouse  Stroke Response RN

## 2020-12-02 NOTE — Procedures (Addendum)
S/P Lt common carotid arteriogram  RT CFA approach. S/P revascularization of occluded Lt MCA sup division with x 1 passwith 45mm x 40 mm solitaireX retriever and contact aspiration and x1 pass with 57mm x 32 mm trevo retriever and contact aspiration with TICI 2C revascularization. Post CT brain  No ICH or mass effect.  Patient left intubated in view of pre procedure global aphasia and difficult communication. Manual pressure held for hemostasis at  the groin puncture due to failure of the angioseal device .Distal pulses  all present bilaterally.  S.Geovanna Simko MD

## 2020-12-02 NOTE — Anesthesia Procedure Notes (Signed)
Procedure Name: Intubation Date/Time: 12/02/2020 3:50 PM Performed by: Eligha Bridegroom, CRNA Pre-anesthesia Checklist: Patient identified, Emergency Drugs available, Suction available, Patient being monitored and Timeout performed Patient Re-evaluated:Patient Re-evaluated prior to induction Preoxygenation: Pre-oxygenation with 100% oxygen Induction Type: IV induction, Rapid sequence and Cricoid Pressure applied Laryngoscope Size: Mac and 4 Grade View: Grade I Tube type: Oral Tube size: 7.0 mm Number of attempts: 1 Airway Equipment and Method: Stylet Placement Confirmation: ETT inserted through vocal cords under direct vision, positive ETCO2 and breath sounds checked- equal and bilateral Secured at: 21 cm Tube secured with: Tape Dental Injury: Teeth and Oropharynx as per pre-operative assessment

## 2020-12-02 NOTE — Anesthesia Procedure Notes (Signed)
Arterial Line Insertion Start/End7/21/2022 3:54 PM, 12/02/2020 3:57 PM Performed by: Effie Berkshire, MD, anesthesiologist  Patient location: Pre-op. Preanesthetic checklist: patient identified, IV checked, site marked, risks and benefits discussed, surgical consent, monitors and equipment checked, pre-op evaluation, timeout performed and anesthesia consent Lidocaine 1% used for infiltration Left, radial was placed Catheter size: 20 Fr Hand hygiene performed  and maximum sterile barriers used   Attempts: 1 Procedure performed without using ultrasound guided technique. Following insertion, dressing applied and Biopatch. Post procedure assessment: normal and unchanged  Patient tolerated the procedure well with no immediate complications. Additional procedure comments: 1st Arterial line dislodged, 2nd arterial catheter immediately placed. Marland Kitchen

## 2020-12-02 NOTE — Anesthesia Postprocedure Evaluation (Signed)
Anesthesia Post Note  Patient: Candice Perez  Procedure(s) Performed: IR WITH ANESTHESIA     Patient location during evaluation: SICU Anesthesia Type: General Level of consciousness: sedated Pain management: pain level controlled Vital Signs Assessment: post-procedure vital signs reviewed and stable Respiratory status: patient remains intubated per anesthesia plan Cardiovascular status: stable Postop Assessment: no apparent nausea or vomiting Anesthetic complications: no   No notable events documented.  Last Vitals:  Vitals:   12/02/20 1900 12/02/20 1937  BP: 110/84 110/84  Pulse: (!) 101 (!) 103  Resp: 14 14  Temp:    SpO2: 100% 100%    Last Pain:  Vitals:   12/02/20 1830  TempSrc: Axillary                 Annalynn Centanni DANIEL

## 2020-12-02 NOTE — Consult Note (Addendum)
NAME:  Candice Perez, MRN:  [id number], DOB:  04-15-1951, LOS: 0 ADMISSION DATE:  12/02/2020, CONSULTATION DATE:  7/21 REFERRING MD:  Estanislado Pandy, CHIEF COMPLAINT:  respiratory failure and vent management s/p acute stroke    History of Present Illness:  This is a 70 year old female who was admitted to [facility] on 7/21 with new Onset atrial fibrillation with RVR.  She has been placed on oral DOAC.  Shortly after arriving she developed rather acute onset aphasia, right-sided weakness and a code stroke was activated.  Stat CT was obtained this was negative for hemorrhage but revealed area concerning left MCA with large volume occlusion tPA was not given as she was on Eliquis she was emergently transferred to [facility] for neuroradiology intervention.  After arrival at [facility] she was directly admitted to interventional radiology where she underwent revascularization of the left MCA with clot extraction.  She was left intubated postprocedure as she had global aphasia preoperatively critical care asked to evaluate and assist with intensive care management in addition to ventilator management.  Pertinent  Medical History  HTN, HL, prior stroke, prior DVT.  Hypothyroidism, depression.  GERD.   Significant Hospital Events: Including procedures, antibiotic start and stop dates in addition to other pertinent events   Initially presented to [facility] with new atrial fib with RVR started on Eliquis.  Shortly after admission developed new global aphasia and right-sided weakness found to have large volume occlusion of the left MCA territory transferred urgently to [facility] where she underwent revascularization interventional radiology.  Return to the intensive care on mechanical ventilator critical care asked to assist with management  Interim History / Subjective:  Remains sedated   Objective   There were no vitals taken for this visit.        Intake/Output Summary (Last 24 hours) at 12/02/2020  1806 Last data filed at 12/02/2020 1749 Gross per 24 hour  Intake 1000 ml  Output no documentation  Net 1000 ml   There were no vitals filed for this visit.  Examination: General: Obese white female currently sedated requiring mechanical ventilation HENT: Neck is large orally intubated pupils equal reactive Lungs: Bilateral rhonchi with expiratory wheeze equal chest rise Cardiovascular: Atrial fibrillation, heart rate in the 100s no murmur rub or gallop Abdomen: Abdomen large soft hypoactive bowel sounds Extremities: Warm dry mild edema bases pulses palpable, R arm IV infiltrate noted on inside of lower arm Neuro: Currently sedated, anesthesia services report intact and normal strength to left side preoperatively, with right-sided weakness specifically able to move antigravity.  Demonstrated global aphasia GU: Foley catheter being placed  Resolved Hospital Problem list     Assessment & Plan:  Acute left MCA stroke(POA) with global aphasia and right-sided weakness, now status post revascularization in interventional radiology -Prior history of stroke Plan Supportive care Serial neuro checks Maintain systolic blood pressure less than 140 Follow-up secondary stroke prevention to be outlined by stroke team, follow-up neuroimaging ordered.  Acute respiratory failure secondary to ineffective airway clearance after large acute stroke Plan Chest x-ray to assess  tube placement  arterial blood gas VAP bundle PAD protocol with RASS goal -1  Atrial fibrillation with rapid ventricular response (present on admission at [facility]) -Currently rate controlled Plan Continue rate control Continue telemetry monitoring Echocardiogram Holding anticoagulation until okay with stroke team  History of GERD Plan PPI  History of hypothyroidism Plan  Synthroid  History of hypertension Plan Holding losartan /hydrochlorothiazide  History of depression Plan Add Wellbutrin when  able  Best Practice (right click and "Reselect all SmartList Selections" daily)   Diet/type: NPO DVT prophylaxis: SCD GI prophylaxis: PPI Lines: N/A Foley:  Yes, and it is still needed Code Status:  full code Last date of multidisciplinary goals of care discussion [per primary ]  Labs   CBC: No results for input(s): WBC, NEUTROABS, HGB, HCT, MCV, PLT in the last 168 hours.  Basic Metabolic Panel: No results for input(s): NA, K, CL, CO2, GLUCOSE, BUN, CREATININE, CALCIUM, MG, PHOS in the last 168 hours. GFR: CrCl cannot be calculated (Patient's most recent lab result is older than the maximum 21 days allowed.). No results for input(s): PROCALCITON, WBC, LATICACIDVEN in the last 168 hours.  Liver Function Tests: No results for input(s): AST, ALT, ALKPHOS, BILITOT, PROT, ALBUMIN in the last 168 hours. No results for input(s): LIPASE, AMYLASE in the last 168 hours. No results for input(s): AMMONIA in the last 168 hours.  ABG No results found for: PHART, PCO2ART, PO2ART, HCO3, TCO2, ACIDBASEDEF, O2SAT   Coagulation Profile: No results for input(s): INR, PROTIME in the last 168 hours.  Cardiac Enzymes: No results for input(s): CKTOTAL, CKMB, CKMBINDEX, TROPONINI in the last 168 hours.  HbA1C: Hgb A1c MFr Bld  Date/Time Value Ref Range Status  06/15/2017 09:30 AM 6.0 (H) 4.8 - 5.6 % Final    Comment:             Prediabetes: 5.7 - 6.4          Diabetes: >6.4          Glycemic control for adults with diabetes: <7.0     CBG: No results for input(s): GLUCAP in the last 168 hours.  Review of Systems:   Not able   Past Medical History:  She,  has a past medical history of Arthritis, DVT (deep venous thrombosis) ([place]), Edema, GERD (gastroesophageal reflux disease), Hyperlipidemia, Hypertension, Hypothyroidism, and Varicose veins.   Surgical History:   Past Surgical History:  Procedure Laterality Date   CHOLECYSTECTOMY     Laparoscopic   CYSTOCELE REPAIR     HERNIA  REPAIR     TONSILLECTOMY     TOTAL KNEE ARTHROPLASTY Left 08/26/2013   Procedure: LEFT TOTAL KNEE REVISION;  Surgeon: Mauri Pole, MD;  Location: [facility] ORS;  Service: Orthopedics;  Laterality: Left;   VARICOSE VEIN SURGERY       Social History:   reports that she quit smoking about 10 years ago. Her smoking use included cigarettes. She has never used smokeless tobacco. She reports that she does not drink alcohol and does not use drugs.   Family History:  Her family history includes Cancer in her father and mother; Congestive Heart Failure in her mother; Other in her father.   Allergies No Known Allergies   Home Medications  Prior to Admission medications   Medication Sig Start Date End Date Taking? Authorizing Provider  buPROPion (WELLBUTRIN XL) 150 MG 24 hr tablet Take 150 mg by mouth daily.    [provider]  clopidogrel (PLAVIX) 75 MG tablet Take 1 tablet (75 mg total) by mouth daily. 08/07/17   Marcial Pacas, MD  levothyroxine (SYNTHROID, LEVOTHROID) 100 MCG tablet Take 100 mcg by mouth daily before breakfast.    [provider]  losartan-hydrochlorothiazide (HYZAAR) 100-25 MG per tablet Take 1 tablet by mouth every morning.    [provider]     Critical care time 32 min   Erick Colace ACNP-BC [place] Pager # [phone] OR #  [phone] if no answer

## 2020-12-03 ENCOUNTER — Inpatient Hospital Stay (HOSPITAL_COMMUNITY): Payer: Medicare HMO

## 2020-12-03 ENCOUNTER — Encounter (HOSPITAL_COMMUNITY): Payer: Self-pay | Admitting: Interventional Radiology

## 2020-12-03 DIAGNOSIS — I639 Cerebral infarction, unspecified: Secondary | ICD-10-CM | POA: Diagnosis not present

## 2020-12-03 DIAGNOSIS — I6389 Other cerebral infarction: Secondary | ICD-10-CM | POA: Diagnosis not present

## 2020-12-03 LAB — CBC WITH DIFFERENTIAL/PLATELET
Abs Immature Granulocytes: 0.03 10*3/uL (ref 0.00–0.07)
Basophils Absolute: 0 10*3/uL (ref 0.0–0.1)
Basophils Relative: 0 %
Eosinophils Absolute: 0 10*3/uL (ref 0.0–0.5)
Eosinophils Relative: 0 %
HCT: 32.2 % — ABNORMAL LOW (ref 36.0–46.0)
Hemoglobin: 10.4 g/dL — ABNORMAL LOW (ref 12.0–15.0)
Immature Granulocytes: 0 %
Lymphocytes Relative: 10 %
Lymphs Abs: 1 10*3/uL (ref 0.7–4.0)
MCH: 30.3 pg (ref 26.0–34.0)
MCHC: 32.3 g/dL (ref 30.0–36.0)
MCV: 93.9 fL (ref 80.0–100.0)
Monocytes Absolute: 0.7 10*3/uL (ref 0.1–1.0)
Monocytes Relative: 7 %
Neutro Abs: 8.9 10*3/uL — ABNORMAL HIGH (ref 1.7–7.7)
Neutrophils Relative %: 83 %
Platelets: 229 10*3/uL (ref 150–400)
RBC: 3.43 MIL/uL — ABNORMAL LOW (ref 3.87–5.11)
RDW: 14.1 % (ref 11.5–15.5)
WBC: 10.7 10*3/uL — ABNORMAL HIGH (ref 4.0–10.5)
nRBC: 0 % (ref 0.0–0.2)

## 2020-12-03 LAB — COMPREHENSIVE METABOLIC PANEL
ALT: 31 U/L (ref 0–44)
AST: 30 U/L (ref 15–41)
Albumin: 3 g/dL — ABNORMAL LOW (ref 3.5–5.0)
Alkaline Phosphatase: 45 U/L (ref 38–126)
Anion gap: 7 (ref 5–15)
BUN: 20 mg/dL (ref 8–23)
CO2: 25 mmol/L (ref 22–32)
Calcium: 8.3 mg/dL — ABNORMAL LOW (ref 8.9–10.3)
Chloride: 107 mmol/L (ref 98–111)
Creatinine, Ser: 1.17 mg/dL — ABNORMAL HIGH (ref 0.44–1.00)
GFR, Estimated: 50 mL/min — ABNORMAL LOW (ref >60)
Glucose, Bld: 135 mg/dL — ABNORMAL HIGH (ref 70–99)
Potassium: 4.2 mmol/L (ref 3.5–5.1)
Sodium: 139 mmol/L (ref 135–145)
Total Bilirubin: 0.4 mg/dL (ref 0.3–1.2)
Total Protein: 5.9 g/dL — ABNORMAL LOW (ref 6.5–8.1)

## 2020-12-03 LAB — GLUCOSE, CAPILLARY
Glucose-Capillary: 128 mg/dL — ABNORMAL HIGH (ref 70–99)
Glucose-Capillary: 186 mg/dL — ABNORMAL HIGH (ref 70–99)
Glucose-Capillary: 163 mg/dL — ABNORMAL HIGH (ref 70–99)
Glucose-Capillary: 145 mg/dL — ABNORMAL HIGH (ref 70–99)
Glucose-Capillary: 199 mg/dL — ABNORMAL HIGH (ref 70–99)

## 2020-12-03 LAB — LIPID PANEL
Cholesterol: 142 mg/dL (ref 0–200)
HDL: 40 mg/dL — ABNORMAL LOW (ref >40)
LDL Cholesterol: 81 mg/dL (ref 0–99)
Total CHOL/HDL Ratio: 3.6 RATIO
Triglycerides: 107 mg/dL (ref <150)
VLDL: 21 mg/dL (ref 0–40)

## 2020-12-03 LAB — ECHOCARDIOGRAM COMPLETE
Height: 67 in
Weight: 4719.61 oz

## 2020-12-03 LAB — HEMOGLOBIN A1C
Hgb A1c MFr Bld: 6.2 % — ABNORMAL HIGH (ref 4.8–5.6)
Mean Plasma Glucose: 131.24 mg/dL

## 2020-12-03 LAB — MAGNESIUM: Magnesium: 2.2 mg/dL (ref 1.7–2.4)

## 2020-12-03 LAB — TRIGLYCERIDES: Triglycerides: 100 mg/dL (ref <150)

## 2020-12-03 MED ORDER — INSULIN ASPART 100 UNIT/ML IJ SOLN
0.0000 [IU] | INTRAMUSCULAR | Status: DC
  Administered 2020-12-03: 2 [IU] via SUBCUTANEOUS
  Administered 2020-12-03 – 2020-12-04 (×3): 3 [IU] via SUBCUTANEOUS
  Administered 2020-12-04: 2 [IU] via SUBCUTANEOUS
  Administered 2020-12-04: 3 [IU] via SUBCUTANEOUS
  Administered 2020-12-04: 2 [IU] via SUBCUTANEOUS
  Administered 2020-12-04 (×2): 3 [IU] via SUBCUTANEOUS
  Administered 2020-12-05: 2 [IU] via SUBCUTANEOUS
  Administered 2020-12-05: 3 [IU] via SUBCUTANEOUS
  Administered 2020-12-05 – 2020-12-06 (×5): 2 [IU] via SUBCUTANEOUS
  Administered 2020-12-07: 3 [IU] via SUBCUTANEOUS
  Administered 2020-12-07 – 2020-12-08 (×3): 2 [IU] via SUBCUTANEOUS
  Administered 2020-12-09 (×2): 3 [IU] via SUBCUTANEOUS

## 2020-12-03 MED ORDER — ORAL CARE MOUTH RINSE
15.0000 mL | OROMUCOSAL | Status: DC
  Administered 2020-12-03 – 2020-12-04 (×15): 15 mL via OROMUCOSAL

## 2020-12-03 MED ORDER — LORAZEPAM 2 MG/ML IJ SOLN
1.0000 mg | Freq: Once | INTRAMUSCULAR | Status: AC
  Administered 2020-12-03: 1 mg via INTRAVENOUS
  Filled 2020-12-03: qty 1

## 2020-12-03 MED ORDER — PROSOURCE TF PO LIQD: 45.0000 mL | Freq: Two times a day (BID) | ORAL | Status: DC

## 2020-12-03 MED ORDER — FENTANYL CITRATE (PF) 100 MCG/2ML IJ SOLN
50.0000 ug | INTRAMUSCULAR | Status: DC | PRN
  Administered 2020-12-03 – 2020-12-04 (×4): 50 ug via INTRAVENOUS
  Filled 2020-12-03 (×4): qty 2

## 2020-12-03 MED ORDER — FUROSEMIDE 10 MG/ML IJ SOLN
20.0000 mg | Freq: Once | INTRAMUSCULAR | Status: AC
  Administered 2020-12-03: 20 mg via INTRAVENOUS
  Filled 2020-12-03: qty 2

## 2020-12-03 MED ORDER — AMIODARONE HCL IN DEXTROSE 360-4.14 MG/200ML-% IV SOLN
60.0000 mg/h | INTRAVENOUS | Status: DC
  Administered 2020-12-03 (×2): 60 mg/h via INTRAVENOUS
  Filled 2020-12-03: qty 200

## 2020-12-03 MED ORDER — PERFLUTREN LIPID MICROSPHERE
1.0000 mL | INTRAVENOUS | Status: AC | PRN
  Administered 2020-12-03: 2 mL via INTRAVENOUS
  Filled 2020-12-03: qty 10

## 2020-12-03 MED ORDER — METOPROLOL TARTRATE 25 MG PO TABS: 12.5000 mg | ORAL_TABLET | Freq: Four times a day (QID) | ORAL | Status: DC

## 2020-12-03 MED ORDER — CHLORHEXIDINE GLUCONATE CLOTH 2 % EX PADS
6.0000 | MEDICATED_PAD | Freq: Every day | CUTANEOUS | Status: DC
  Administered 2020-12-04 – 2020-12-09 (×5): 6 via TOPICAL

## 2020-12-03 MED ORDER — AMIODARONE HCL IN DEXTROSE 360-4.14 MG/200ML-% IV SOLN
30.0000 mg/h | INTRAVENOUS | Status: DC
  Administered 2020-12-03 – 2020-12-05 (×3): 30 mg/h via INTRAVENOUS
  Filled 2020-12-03 (×4): qty 200

## 2020-12-03 MED ORDER — AMIODARONE LOAD VIA INFUSION
150.0000 mg | Freq: Once | INTRAVENOUS | Status: AC
  Administered 2020-12-03: 150 mg via INTRAVENOUS
  Filled 2020-12-03: qty 83.34

## 2020-12-03 MED ORDER — PHENYLEPHRINE HCL-NACL 10-0.9 MG/250ML-% IV SOLN
25.0000 ug/min | INTRAVENOUS | Status: DC
Start: 2020-12-03 — End: 2020-12-04
  Administered 2020-12-03 (×3): 200 ug/min via INTRAVENOUS
  Administered 2020-12-03: 100 ug/min via INTRAVENOUS
  Administered 2020-12-03: 25 ug/min via INTRAVENOUS
  Administered 2020-12-03 (×2): 160 ug/min via INTRAVENOUS
  Filled 2020-12-03 (×2): qty 250
  Filled 2020-12-03 (×3): qty 500

## 2020-12-03 MED ORDER — MIDAZOLAM HCL 2 MG/2ML IJ SOLN: 2.0000 mg | INTRAMUSCULAR | Status: DC | PRN

## 2020-12-03 MED ORDER — PROSOURCE TF PO LIQD
90.0000 mL | Freq: Two times a day (BID) | ORAL | Status: DC
Start: 2020-12-03 — End: 2020-12-06
  Administered 2020-12-03 – 2020-12-06 (×7): 90 mL
  Filled 2020-12-03 (×7): qty 90

## 2020-12-03 MED ORDER — CHLORHEXIDINE GLUCONATE 0.12% ORAL RINSE (MEDLINE KIT)
15.0000 mL | Freq: Two times a day (BID) | OROMUCOSAL | Status: DC
  Administered 2020-12-03 – 2020-12-09 (×13): 15 mL via OROMUCOSAL

## 2020-12-03 MED ORDER — SODIUM CHLORIDE 0.9 % IV SOLN: 250.0000 mL | INTRAVENOUS | Status: DC

## 2020-12-03 MED ORDER — VITAL HIGH PROTEIN PO LIQD: 1000.0000 mL | ORAL | Status: DC

## 2020-12-03 MED ORDER — METOPROLOL TARTRATE 5 MG/5ML IV SOLN
5.0000 mg | Freq: Once | INTRAVENOUS | Status: AC
  Administered 2020-12-03: 5 mg via INTRAVENOUS
  Filled 2020-12-03: qty 5

## 2020-12-03 MED ORDER — OSMOLITE 1.5 CAL PO LIQD
1000.0000 mL | ORAL | Status: DC
  Administered 2020-12-03 – 2020-12-05 (×2): 1000 mL
  Filled 2020-12-03: qty 1000

## 2020-12-03 MED ORDER — HEPARIN (PORCINE) 25000 UT/250ML-% IV SOLN
2200.0000 [IU]/h | INTRAVENOUS | Status: DC
  Administered 2020-12-03: 1150 [IU]/h via INTRAVENOUS
  Administered 2020-12-04: 1700 [IU]/h via INTRAVENOUS
  Administered 2020-12-05: 1900 [IU]/h via INTRAVENOUS
  Administered 2020-12-05: 2100 [IU]/h via INTRAVENOUS
  Administered 2020-12-06: 2200 [IU]/h via INTRAVENOUS
  Filled 2020-12-03 (×6): qty 250

## 2020-12-03 MED ORDER — METOPROLOL TARTRATE 5 MG/5ML IV SOLN
2.5000 mg | INTRAVENOUS | Status: DC | PRN
  Administered 2020-12-03: 2.5 mg via INTRAVENOUS
  Filled 2020-12-03: qty 5

## 2020-12-03 MED ORDER — ROSUVASTATIN CALCIUM 20 MG PO TABS
20.0000 mg | ORAL_TABLET | Freq: Every day | ORAL | Status: DC
  Administered 2020-12-03 – 2020-12-08 (×6): 20 mg
  Filled 2020-12-03 (×6): qty 1

## 2020-12-03 NOTE — Consult Note (Deleted)
NAME:  Candice Perez, MRN:  [id number], DOB:  [birth year], LOS: 1 ADMISSION DATE:  12/02/2020, CONSULTATION DATE:  7/21 REFERRING MD:  Estanislado Pandy, CHIEF COMPLAINT:  respiratory failure and vent management s/p acute stroke    History of Present Illness:  This is a 70 year old female who was admitted to [facility] on 7/21 with new Onset atrial fibrillation with RVR.  She has been placed on oral DOAC.  Shortly after arriving she developed rather acute onset aphasia, right-sided weakness and a code stroke was activated.  Stat CT was obtained this was negative for hemorrhage but revealed area concerning left MCA with large volume occlusion tPA was not given as she was on Eliquis she was emergently transferred to [facility] for neuroradiology intervention.  After arrival at [facility] she was directly admitted to interventional radiology where she underwent revascularization of the left MCA with clot extraction.  She was left intubated postprocedure as she had global aphasia preoperatively critical care asked to evaluate and assist with intensive care management in addition to ventilator management.  Pertinent  Medical History  HTN, HL, prior stroke, prior DVT.  Hypothyroidism, depression.  GERD.   Significant Hospital Events: Including procedures, antibiotic start and stop dates in addition to other pertinent events   Initially presented to [facility] with new atrial fib with RVR started on Eliquis.  Shortly after admission developed new global aphasia and right-sided weakness found to have large volume occlusion of the left MCA territory transferred urgently to [facility] where she underwent revascularization interventional radiology.  Return to the intensive care on mechanical ventilator critical care asked to assist with management  Interim History / Subjective:  Remains sedated   Objective   Blood pressure 97/67, pulse (!) 122, temperature (!) 97.2 F (36.2 C), temperature source Axillary, resp. rate  16, height '5\' 7"'$  (1.702 m), weight 133.8 kg, SpO2 93 %.    Vent Mode: PRVC FiO2 (%):  [50 %-100 %] 50 % Set Rate:  [14 bmp-16 bmp] 16 bmp Vt Set:  [490 mL] 490 mL PEEP:  [5 cmH20] 5 cmH20 Plateau Pressure:  [17 cmH20-22 cmH20] 17 cmH20   Intake/Output Summary (Last 24 hours) at 12/03/2020 0804 Last data filed at 12/03/2020 0600 Gross per 24 hour  Intake 2803.3 ml  Output 1300 ml  Net 1503.3 ml    Filed Weights   12/02/20 2000  Weight: 133.8 kg    Examination: General: Obese white female currently sedated requiring mechanical ventilation HENT: Neck is large orally intubated pupils equal reactive Lungs: Bilateral rhonchi with expiratory wheeze equal chest rise Cardiovascular: Atrial fibrillation, heart rate in the 100s no murmur rub or gallop Abdomen: Abdomen large soft hypoactive bowel sounds Extremities: Warm dry mild edema bases pulses palpable, R arm IV infiltrate noted on inside of lower arm Neuro: Currently sedated, anesthesia services report intact and normal strength to left side preoperatively, with right-sided weakness specifically able to move antigravity.  Demonstrated global aphasia GU: Foley catheter being placed  Resolved Hospital Problem list     Assessment & Plan:  Acute left MCA stroke(POA) with global aphasia and right-sided weakness, now status post revascularization in interventional radiology -Prior history of stroke Plan Supportive care Serial neuro checks Maintain systolic blood pressure less than 140 Follow-up secondary stroke prevention to be outlined by stroke team, follow-up neuroimaging ordered  Acute respiratory failure secondary to ineffective airway clearance after large acute stroke: Desatted with SBT. Pulmonary edema on CXR. Plan PRVC, wean, PSV as tolerated Lasix VAP bundle PAD  protocol with RASS goal -1  Atrial fibrillation with rapid ventricular response (present on admission at [facility]) -in RVR Plan Metoprolol 12.5 q6 hrs,  increase as needed Continue telemetry monitoring Echocardiogram scheduled Holding anticoagulation until okay with stroke team  History of GERD PPI  History of hypothyroidism Synthroid  History of hypertension Holding losartan /hydrochlorothiazide Metoprolol for RVR as above Clevidipine ordered - low dose try to wean with addition of metop  History of depression Add Wellbutrin when able  Best Practice (right click and "Reselect all SmartList Selections" daily)   Diet/type: NPO, start TF DVT prophylaxis: SCD GI prophylaxis: PPI Lines: N/A Foley:  Yes, and it is still needed Code Status:  full code Last date of multidisciplinary goals of care discussion [per primary ]  Labs   CBC: Recent Labs  Lab 12/02/20 1934 12/03/20 0329  WBC  --  10.7*  NEUTROABS  --  8.9*  HGB 10.9* 10.4*  HCT 32.0* 32.2*  MCV  --  93.9  PLT  --  [id number]    Basic Metabolic Panel: Recent Labs  Lab 12/02/20 1934 12/03/20 0242 12/03/20 0329  NA 140  --  139  K 4.5  --  4.2  CL  --   --  107  CO2  --   --  25  GLUCOSE  --   --  135*  BUN  --   --  20  CREATININE  --   --  1.17*  CALCIUM  --   --  8.3*  MG  --  2.2  --    GFR: Estimated Creatinine Clearance: 62.1 mL/min (A) (by C-G formula based on SCr of 1.17 mg/dL (H)). Recent Labs  Lab 12/03/20 0329  WBC 10.7*    Liver Function Tests: Recent Labs  Lab 12/03/20 0329  AST 30  ALT 31  ALKPHOS 45  BILITOT 0.4  PROT 5.9*  ALBUMIN 3.0*   No results for input(s): LIPASE, AMYLASE in the last 168 hours. No results for input(s): AMMONIA in the last 168 hours.  ABG    Component Value Date/Time   PHART 7.294 (L) 12/02/2020 1934   PCO2ART 53.6 (H) 12/02/2020 1934   PO2ART 172 (H) 12/02/2020 1934   HCO3 26.3 12/02/2020 1934   TCO2 28 12/02/2020 1934   ACIDBASEDEF 1.0 12/02/2020 1934   O2SAT 99.0 12/02/2020 1934     Coagulation Profile: No results for input(s): INR, PROTIME in the last 168 hours.  Cardiac Enzymes: No  results for input(s): CKTOTAL, CKMB, CKMBINDEX, TROPONINI in the last 168 hours.  HbA1C: Hgb A1c MFr Bld  Date/Time Value Ref Range Status  12/03/2020 03:30 AM 6.2 (H) 4.8 - 5.6 % Final    Comment:    (NOTE) Pre diabetes:          5.7%-6.4%  Diabetes:              >6.4%  Glycemic control for   <7.0% adults with diabetes   06/15/2017 09:30 AM 6.0 (H) 4.8 - 5.6 % Final    Comment:             Prediabetes: 5.7 - 6.4          Diabetes: >6.4          Glycemic control for adults with diabetes: <7.0     CBG: Recent Labs  Lab 12/03/20 0737  GLUCAP 128*    Review of Systems:   Not able   Past Medical History:  She,  has a past  medical history of Arthritis, DVT (deep venous thrombosis) ([facility]), Edema, GERD (gastroesophageal reflux disease), Hyperlipidemia, Hypertension, Hypothyroidism, and Varicose veins.   Surgical History:   Past Surgical History:  Procedure Laterality Date   CHOLECYSTECTOMY     Laparoscopic   CYSTOCELE REPAIR     HERNIA REPAIR     TONSILLECTOMY     TOTAL KNEE ARTHROPLASTY Left 08/26/2013   Procedure: LEFT TOTAL KNEE REVISION;  Surgeon: Mauri Pole, MD;  Location: [facility] ORS;  Service: Orthopedics;  Laterality: Left;   VARICOSE VEIN SURGERY       Social History:   reports that she quit smoking about 10 years ago. Her smoking use included cigarettes. She has never used smokeless tobacco. She reports that she does not drink alcohol and does not use drugs.   Family History:  Her family history includes Cancer in her father and mother; Congestive Heart Failure in her mother; Other in her father.   Allergies No Known Allergies   Home Medications  Prior to Admission medications   Medication Sig Start Date End Date Taking? Authorizing Provider  buPROPion (WELLBUTRIN XL) 150 MG 24 hr tablet Take 150 mg by mouth daily.    [provider]  clopidogrel (PLAVIX) 75 MG tablet Take 1 tablet (75 mg total) by mouth daily. 08/07/17   Marcial Pacas, MD   levothyroxine (SYNTHROID, LEVOTHROID) 100 MCG tablet Take 100 mcg by mouth daily before breakfast.    [provider]  losartan-hydrochlorothiazide (HYZAAR) 100-25 MG per tablet Take 1 tablet by mouth every morning.    [provider]     Critical care time    CRITICAL CARE Performed by: Lanier Clam   Total critical care time: 33 minutes  Critical care time was exclusive of separately billable procedures and treating other patients.  Critical care was necessary to treat or prevent imminent or life-threatening deterioration.  Critical care was time spent personally by me on the following activities: development of treatment plan with patient and/or surrogate as well as nursing, discussions with consultants, evaluation of patient's response to treatment, examination of patient, obtaining history from patient or surrogate, ordering and performing treatments and interventions, ordering and review of laboratory studies, ordering and review of radiographic studies, pulse oximetry and re-evaluation of patient's condition.   Lanier Clam, MD See Shea Evans for contact info

## 2020-12-03 NOTE — Procedures (Signed)
Cortrak  Person Inserting Tube:  Firmin Belisle, RD Tube Type:  Cortrak - 43 inches Tube Size:  10 Tube Location:  Right nare Initial Placement:  Stomach Secured by: Bridle Technique Used to Measure Tube Placement:  Marking at nare/corner of mouth Cortrak Secured At:  66 cm  Cortrak Tube Team Note:  Consult received to place a Cortrak feeding tube.   X-ray is required, abdominal x-ray has been ordered by the Cortrak team. Please confirm tube placement before using the Cortrak tube.   If the tube becomes dislodged please keep the tube and contact the Cortrak team at [email or web address] (password [email or web address]) for replacement.  If after hours and replacement cannot be delayed, place a NG tube and confirm placement with an abdominal x-ray.    Mariana Single MS, RD, LDN, CNSC Clinical Nutrition Pager listed in [place]

## 2020-12-03 NOTE — Progress Notes (Signed)
PT Cancellation Note  Patient Details Name: Candice Perez MRN: [id number] DOB: 25-Oct-1950   Cancelled Treatment:    Reason Eval/Treat Not Completed: Medical issues which prohibited therapy this afternoon as pt remains intubated, desating with attempts to wean, in uncontrolled afib, and receiving new BP medications to stabilize. Will continue to follow and eval as appropriate.   Inocencio Homes, PT, DPT   Acute Rehabilitation Department Pager #: [phone]   Otho Bellows 12/03/2020, 1:24 PM

## 2020-12-03 NOTE — Progress Notes (Signed)
eLink Physician-Brief Progress Note Patient Name: Candice Perez DOB: 1950-12-15 MRN: [id number]   Date of Service  12/03/2020  HPI/Events of Note  AFIB with RVR. Ventricular  rate = 120-130. BP = 141/62 with MAP = 85. Patient has a PMH of AFIB on Eliquis. However, not anticoagulated at this time.  eICU Interventions  Plan:  Send AM labs now. Magnesium level STAT. Metoprolol 2.5 mg IV Q 3 hours PRN ventricular rate > 115. Hold dose for SBP < 120.      Intervention Category Major Interventions: Arrhythmia - evaluation and management  Essie Gehret Cornelia Copa 12/03/2020, 2:34 AM

## 2020-12-03 NOTE — Progress Notes (Signed)
Brief PCCM consult note:  Contacted by RN for RVR HR 140s. IV fentanyl given over concern for pain as driver. SBP now lower after PRN analgesia, SBP 90. Start peripheral phenylephrine. IV metoprolol x1. Suspect hypotension due to analgesia bolus but if BP remains soft will need to consider amiodarone and avoid beta blockers, calcium channel blockers. Clevidipine d/c'd with hypotension.

## 2020-12-03 NOTE — Progress Notes (Signed)
Initial Nutrition Assessment  DOCUMENTATION CODES:   Morbid obesity  INTERVENTION:   Initiate tube feeding via  Cortrak tube: Osmolite 1.5 at 45 ml/h (1080 ml per day) Prosource TF 90 ml BID  Provides 1780 kcal, 111 gm protein, 820 ml free water daily  TF regimen and propofol at current rate providing 1991 total kcal/day    NUTRITION DIAGNOSIS:   Inadequate oral intake related to inability to eat as evidenced by NPO status.  GOAL:   Patient will meet greater than or equal to 90% of their needs  MONITOR:   TF tolerance  REASON FOR ASSESSMENT:   Consult, Ventilator Enteral/tube feeding initiation and management  ASSESSMENT:   Pt admitted to [facility] on 7/21 with new onset Afib with RVR after arriving pt develped aphasia and found to have large L MCA.    7/21 tx emergently to [facility] for IR revascularization and clot extraction 7/22 cortrak placed; tip gastric    Pt discussed during ICU rounds and with RN. Pt awake and alert on vent, able to nod yes/no to questions. No weight loss and good appetite per pt. Unable to extubate due to pulmonary edema.    Patient is currently intubated on ventilator support MV: 8.7 L/min Temp (24hrs), Avg:98.2 F (36.8 C), Min:97.2 F (36.2 C), Max:98.7 F (37.1 C) MAP: >72   Propofol: 8 ml/hr provides: 211 kcal  Medications reviewed and include: SSI, protonix  NS @ 75 ml/hr Neosynephrine @ 160 mcg  Labs reviewed:  CBG's: 128-186    NUTRITION - FOCUSED PHYSICAL EXAM:  Flowsheet Row Most Recent Value  Orbital Region No depletion  Upper Arm Region No depletion  Thoracic and Lumbar Region No depletion  Buccal Region No depletion  Temple Region No depletion  Clavicle Bone Region No depletion  Clavicle and Acromion Bone Region No depletion  Scapular Bone Region No depletion  Dorsal Hand No depletion  Patellar Region No depletion  Anterior Thigh Region No depletion  Posterior Calf Region No depletion  Edema (RD Assessment)  Mild  Hair Reviewed  Eyes Reviewed  Mouth Reviewed  Skin Reviewed  Nails Reviewed       Diet Order:   Diet Order             Diet NPO time specified  Diet effective now                   EDUCATION NEEDS:   No education needs have been identified at this time  Skin:  Skin Assessment: Reviewed RN Assessment  Last BM:  unknown  Height:   Ht Readings from Last 1 Encounters:  12/02/20 '5\' 7"'$  (1.702 m)    Weight:   Wt Readings from Last 1 Encounters:  12/02/20 133.8 kg    Ideal Body Weight:     BMI:  Body mass index is 46.2 kg/m.  Estimated Nutritional Needs:   Kcal:  1800-2000  Protein:  100-120 grams  Fluid:  >1.8 L/day  Lockie Pares., RD, LDN, CNSC See AMiON for contact information

## 2020-12-03 NOTE — Progress Notes (Signed)
  Echocardiogram 2D Echocardiogram has been performed.  Michiel Cowboy 12/03/2020, 10:32 AM

## 2020-12-03 NOTE — Progress Notes (Addendum)
OT Cancellation Note  Patient Details Name: Candice Perez MRN: [id number] DOB: 12-22-1950   Cancelled Treatment:    Reason Eval/Treat Not Completed: Patient not medically ready (intubated; desating w/ weaning, afib uncontrolled; on neo for BP)  Billey Chang, OTR/L  Acute Rehabilitation Services Pager: [phone] Office: [phone] .  12/03/2020, 1:05 PM

## 2020-12-03 NOTE — Progress Notes (Signed)
   12/03/20 0730  Airway 7 mm  Placement Date/Time: 12/02/20 1628   Placed By: Anesthesiologist  Airway Device: Endotracheal Tube  Laryngoscope Blade: 4  ETT Types: Oral  Size (mm): 7 mm  Cuffed: Cuffed  Insertion attempts: 1  Airway Equipment: Stylet  Secured at (cm): 22 cm  Secured at (cm) 22 cm  Measured From Lips  Secured Location Right  Secured By Actuary Repositioned Yes  Prone position No  Cuff Pressure (cm H2O) Green OR 18-26 CmH2O  Site Condition Dry  Adult Ventilator Settings  Vent Type Servo U  Humidity HME  Vent Mode PRVC  Vt Set 490 mL  Set Rate 16 bmp  FiO2 (%) 50 % (at 40% spo2 89)  I Time 0.9 Sec(s)  PEEP 5 cmH20  Adult Ventilator Measurements  Peak Airway Pressure 20 L/min  Mean Airway Pressure 8 cmH20  Plateau Pressure 17 cmH20  Resp Rate Spontaneous 2 br/min  Resp Rate Total 18 br/min  Exhaled Vt 485 mL  Measured Ve 8.7 mL  I:E Ratio Measured 1:3.1  Auto PEEP 0 cmH20  Total PEEP 5 cmH20  SpO2 93 %  Adult Ventilator Alarms  Alarms On Y  Ve High Alarm 20 L/min  Ve Low Alarm 4 L/min  Resp Rate High Alarm 38 br/min  Resp Rate Low Alarm 8  PEEP Low Alarm 2 cmH2O  Press High Alarm 45 cmH2O  VAP Prevention  HME changed No  Ventilator changed No  HOB> 30 Degrees Y  Equipment wiped down Yes  Daily Weaning Assessment  Daily Assessment of Readiness to Wean Wean protocol criteria not met  Reason not met Fi02 > 40%  Breath Sounds  Bilateral Breath Sounds Diminished  Airway Suctioning/Secretions  Suction Type ETT  Suction Device  Catheter  Secretion Amount Small  Secretion Color Tan  Secretion Consistency Thick  Suction Tolerance Tolerated well  Suctioning Adverse Effects None

## 2020-12-03 NOTE — Progress Notes (Signed)
SLP Cancellation Note  Patient Details Name: Candice Perez MRN: [id number] DOB: December 10, 1950   Cancelled treatment:       Reason Eval/Treat Not Completed: Patient not medically ready   Libbi Towner, Katherene Ponto 12/03/2020, 7:29 AM

## 2020-12-03 NOTE — Procedures (Signed)
Arterial Catheter Insertion Procedure Note  LUWAM CURRIE  [id number]  15-Jan-1951  Date:12/03/20  Time:5:37 PM    Provider Performing: Myrtie Neither    Procedure: Insertion of Arterial Line [phone]) without US guidance  Indication(s) Blood pressure monitoring and/or need for frequent ABGs  Consent Unable to obtain consent due to emergent nature of procedure.  Anesthesia None   Time Out Verified patient identification, verified procedure, site/side was marked, verified correct patient position, special equipment/implants available, medications/allergies/relevant history reviewed, required imaging and test results available.   Sterile Technique Maximal sterile technique including full sterile barrier drape, hand hygiene, sterile gown, sterile gloves, mask, hair covering, sterile ultrasound probe cover (if used).   Procedure Description Area of catheter insertion was cleaned with chlorhexidine and draped in sterile fashion. Without real-time ultrasound guidance an arterial catheter was placed into the right radial artery.  Appropriate arterial tracings confirmed on monitor.     Complications/Tolerance None; patient tolerated the procedure well.   EBL Minimal   Specimen(s) None

## 2020-12-03 NOTE — Progress Notes (Signed)
NAME:  Candice Perez, MRN:  [id number], DOB:  04-18-1951, LOS: 1 ADMISSION DATE:  12/02/2020, CONSULTATION DATE:  7/21 REFERRING MD:  Estanislado Pandy, CHIEF COMPLAINT:  respiratory failure and vent management s/p acute stroke    History of Present Illness:  This is a 70 year old female who was admitted to [facility] on 7/21 with new Onset atrial fibrillation with RVR.  She has been placed on oral DOAC.  Shortly after arriving she developed rather acute onset aphasia, right-sided weakness and a code stroke was activated.  Stat CT was obtained this was negative for hemorrhage but revealed area concerning left MCA with large volume occlusion tPA was not given as she was on Eliquis she was emergently transferred to [facility] for neuroradiology intervention.  After arrival at [facility] she was directly admitted to interventional radiology where she underwent revascularization of the left MCA with clot extraction.  She was left intubated postprocedure as she had global aphasia preoperatively critical care asked to evaluate and assist with intensive care management in addition to ventilator management.  Pertinent  Medical History  HTN, HL, prior stroke, prior DVT.  Hypothyroidism, depression.  GERD.   Significant Hospital Events: Including procedures, antibiotic start and stop dates in addition to other pertinent events   Initially presented to [facility] with new atrial fib with RVR started on Eliquis.  Shortly after admission developed new global aphasia and right-sided weakness found to have large volume occlusion of the left MCA territory transferred urgently to [facility] where she underwent revascularization interventional radiology.  Return to the intensive care on mechanical ventilator critical care asked to assist with management  Interim History / Subjective:  Follows commands all extremities.    Objective   Blood pressure 96/70, pulse (!) 119, temperature 98.6 F (37 C), temperature source  Axillary, resp. rate 17, height '5\' 7"'$  (1.702 m), weight 133.8 kg, SpO2 93 %.    Vent Mode: PRVC FiO2 (%):  [50 %-100 %] 50 % Set Rate:  [14 bmp-16 bmp] 16 bmp Vt Set:  [490 mL] 490 mL PEEP:  [5 cmH20] 5 cmH20 Plateau Pressure:  [17 cmH20-22 cmH20] 17 cmH20   Intake/Output Summary (Last 24 hours) at 12/03/2020 [id number] Last data filed at 12/03/2020 0600 Gross per 24 hour  Intake 2803.3 ml  Output 1300 ml  Net 1503.3 ml    Filed Weights   12/02/20 2000  Weight: 133.8 kg    Examination: General: Obese white female currently sedated requiring mechanical ventilation HENT: Neck is large orally intubated pupils equal reactive Lungs: coarse, ventilated Cardiovascular: Atrial fibrillation, heart rate in the 120s no murmur rub or gallop Abdomen: Abdomen large soft hypoactive bowel sounds Extremities: LE edema present Neuro: Moves all extremities against gravity.  Nods yes no.  Resolved Hospital Problem list     Assessment & Plan:  Acute left MCA stroke(POA) with global aphasia and right-sided weakness, now status post revascularization in interventional radiology -Prior history of stroke Plan Supportive care Serial neuro checks Maintain systolic blood pressure less than 140 Follow-up secondary stroke prevention to be outlined by stroke team, follow-up neuroimaging ordered  Acute respiratory failure secondary to ineffective airway clearance after large acute stroke: Desatted with SBT. Pulmonary edema on CXR. Plan PRVC, wean, PSV as tolerated Lasix VAP bundle PAD protocol with RASS goal -1  Atrial fibrillation with rapid ventricular response (present on admission at [facility]) -in RVR, likely contributing to volume overload Plan Metoprolol 12.5 q6 hrs, increase as needed Continue telemetry monitoring Echocardiogram scheduled Holding anticoagulation  until okay with stroke team  History of GERD PPI  History of hypothyroidism Synthroid  History of hypertension Holding  losartan /hydrochlorothiazide Metoprolol for RVR as above Clevidipine ordered - low dose try to wean with addition of metop  History of depression Add Wellbutrin when able  Best Practice (right click and "Reselect all SmartList Selections" daily)   Diet/type: NPO, start TF DVT prophylaxis: SCD GI prophylaxis: PPI Lines: N/A Foley:  Yes, and it is still needed Code Status:  full code Last date of multidisciplinary goals of care discussion [per primary ]  Labs   CBC: Recent Labs  Lab 12/02/20 1934 12/03/20 0329  WBC  --  10.7*  NEUTROABS  --  8.9*  HGB 10.9* 10.4*  HCT 32.0* 32.2*  MCV  --  93.9  PLT  --  229     Basic Metabolic Panel: Recent Labs  Lab 12/02/20 1934 12/03/20 0242 12/03/20 0329  NA 140  --  139  K 4.5  --  4.2  CL  --   --  107  CO2  --   --  25  GLUCOSE  --   --  135*  BUN  --   --  20  CREATININE  --   --  1.17*  CALCIUM  --   --  8.3*  MG  --  2.2  --     GFR: Estimated Creatinine Clearance: 62.1 mL/min (A) (by C-G formula based on SCr of 1.17 mg/dL (H)). Recent Labs  Lab 12/03/20 0329  WBC 10.7*     Liver Function Tests: Recent Labs  Lab 12/03/20 0329  AST 30  ALT 31  ALKPHOS 45  BILITOT 0.4  PROT 5.9*  ALBUMIN 3.0*    No results for input(s): LIPASE, AMYLASE in the last 168 hours. No results for input(s): AMMONIA in the last 168 hours.  ABG    Component Value Date/Time   PHART 7.294 (L) 12/02/2020 1934   PCO2ART 53.6 (H) 12/02/2020 1934   PO2ART 172 (H) 12/02/2020 1934   HCO3 26.3 12/02/2020 1934   TCO2 28 12/02/2020 1934   ACIDBASEDEF 1.0 12/02/2020 1934   O2SAT 99.0 12/02/2020 1934     Coagulation Profile: No results for input(s): INR, PROTIME in the last 168 hours.  Cardiac Enzymes: No results for input(s): CKTOTAL, CKMB, CKMBINDEX, TROPONINI in the last 168 hours.  HbA1C: Hgb A1c MFr Bld  Date/Time Value Ref Range Status  12/03/2020 03:30 AM 6.2 (H) 4.8 - 5.6 % Final    Comment:    (NOTE) Pre  diabetes:          5.7%-6.4%  Diabetes:              >6.4%  Glycemic control for   <7.0% adults with diabetes   06/15/2017 09:30 AM 6.0 (H) 4.8 - 5.6 % Final    Comment:             Prediabetes: 5.7 - 6.4          Diabetes: >6.4          Glycemic control for adults with diabetes: <7.0     CBG: Recent Labs  Lab 12/03/20 0737  GLUCAP 128*     Review of Systems:   Not able   Past Medical History:  She,  has a past medical history of Arthritis, DVT (deep venous thrombosis) ([place]), Edema, GERD (gastroesophageal reflux disease), Hyperlipidemia, Hypertension, Hypothyroidism, and Varicose veins.   Surgical History:   Past Surgical History:  Procedure Laterality Date   CHOLECYSTECTOMY     Laparoscopic   CYSTOCELE REPAIR     HERNIA REPAIR     TONSILLECTOMY     TOTAL KNEE ARTHROPLASTY Left 08/26/2013   Procedure: LEFT TOTAL KNEE REVISION;  Surgeon: Mauri Pole, MD;  Location: [facility] ORS;  Service: Orthopedics;  Laterality: Left;   VARICOSE VEIN SURGERY       Social History:   reports that she quit smoking about 10 years ago. Her smoking use included cigarettes. She has never used smokeless tobacco. She reports that she does not drink alcohol and does not use drugs.   Family History:  Her family history includes Cancer in her father and mother; Congestive Heart Failure in her mother; Other in her father.   Allergies No Known Allergies   Home Medications  Prior to Admission medications   Medication Sig Start Date End Date Taking? Authorizing Provider  buPROPion (WELLBUTRIN XL) 150 MG 24 hr tablet Take 150 mg by mouth daily.    [provider]  clopidogrel (PLAVIX) 75 MG tablet Take 1 tablet (75 mg total) by mouth daily. 08/07/17   Marcial Pacas, MD  levothyroxine (SYNTHROID, LEVOTHROID) 100 MCG tablet Take 100 mcg by mouth daily before breakfast.    [provider]  losartan-hydrochlorothiazide (HYZAAR) 100-25 MG per tablet Take 1 tablet by mouth every morning.     [provider]     Critical care time    CRITICAL CARE Performed by: Lanier Clam   Total critical care time: 33 minutes  Critical care time was exclusive of separately billable procedures and treating other patients.  Critical care was necessary to treat or prevent imminent or life-threatening deterioration.  Critical care was time spent personally by me on the following activities: development of treatment plan with patient and/or surrogate as well as nursing, discussions with consultants, evaluation of patient's response to treatment, examination of patient, obtaining history from patient or surrogate, ordering and performing treatments and interventions, ordering and review of laboratory studies, ordering and review of radiographic studies, pulse oximetry and re-evaluation of patient's condition.   Lanier Clam, MD See Shea Evans for contact info

## 2020-12-03 NOTE — Progress Notes (Signed)
ANTICOAGULATION CONSULT NOTE - Initial Consult  Pharmacy Consult for heparin Indication: atrial fibrillation  No Known Allergies  Patient Measurements: Height: '5\' 7"'$  (170.2 cm) Weight: 133.8 kg (294 lb 15.6 oz) IBW/kg (Calculated) : 61.6 Heparin Dosing Weight: 94kg  Vital Signs: Temp: 98.7 F (37.1 C) (07/22 1200) Temp Source: Axillary (07/22 1200) BP: 132/87 (07/22 1730) Pulse Rate: 136 (07/22 1730)  Labs: Recent Labs    12/02/20 1934 12/03/20 0329  HGB 10.9* 10.4*  HCT 32.0* 32.2*  PLT  --  229  CREATININE  --  1.17*    Estimated Creatinine Clearance: 62.1 mL/min (A) (by C-G formula based on SCr of 1.17 mg/dL (H)).   Medical History: Past Medical History:  Diagnosis Date   Arthritis    DVT (deep venous thrombosis) (HCC)    Edema    GERD (gastroesophageal reflux disease)    OCCASIONAL - PRILOSEC IF NEEDED   Hyperlipidemia    Hypertension    no meds now   Hypothyroidism    Varicose veins     Assessment: 59 YOF presenting as code stroke from [place] on Eliquis started at [facility] (7/21 AM), s/p thrombectomy and ok per neuro for heparin gtt start with low end goals and no bolus.    Goal of Therapy:  Heparin level 0.3-0.5 units/ml aPTT 66-85 seconds Monitor platelets by anticoagulation protocol: Yes   Plan:  Heparin gtt at 1150 units/hr, no bolus F/u 8 hour aPTT/HL F/u ability to transition to [place], PharmD Clinical Pharmacist ED Pharmacist Phone # [phone] 12/03/2020 5:53 PM

## 2020-12-03 NOTE — Progress Notes (Signed)
Chief Complaint: Patient was seen today for CVA post intervention   Supervising Physician: Luanne Bras  Patient Status: [facility] - In-pt  Subjective: [facility] CVA s/p revascularization of occluded Lt MCA sup division with x 1 passwith 63m x 40 mm solitaireX retriever and contact aspiration and x1 pass with 334mx 32 mm trevo retriever and contact aspiration with TICI 2C revascularization.  Remains on some sedation and intubated. Awake. Husband at bedside  Objective: Physical Exam: BP 96/70   Pulse (!) 119   Temp 98.6 F (37 C) (Axillary)   Resp 17   Ht '5\' 7"'$  (1.702 m)   Wt 133.8 kg   SpO2 93%   BMI 46.20 kg/m  Awake but intubated. Appears to comprehend well. No obvious facial droop Able to move all extremities. 3/5 weakness (R)UE 4/5 weakness (R)LE Unable to do full neuro assessment due to some sedation and intubation.   Current Facility-Administered Medications:     stroke: mapping our early stages of recovery book, , Does not apply, Once, Toberman, Stevi W, NP   0.9 %  sodium chloride infusion, , Intravenous, Continuous, Toberman, Stevi W, NP, New Bag at 12/02/20 1545   0.9 %  sodium chloride infusion, , Intravenous, Continuous, Deveshwar, Sanjeev, MD, Last Rate: 75 mL/hr at 12/03/20 0600, Infusion Verify at 12/03/20 0600   acetaminophen (TYLENOL) tablet 650 mg, 650 mg, Oral, Q4H PRN **OR** acetaminophen (TYLENOL) 160 MG/5ML solution 650 mg, 650 mg, Per Tube, Q4H PRN **OR** acetaminophen (TYLENOL) suppository 650 mg, 650 mg, Rectal, Q4H PRN, ToEbbie LatusStevi W, NP   Chlorhexidine Gluconate Cloth 2 % PADS 6 each, 6 each, Topical, Daily, SeLeonie ManPrLucy AntiguaMD   clevidipine (CLEVIPREX) infusion 0.5 mg/mL, 0-21 mg/hr, Intravenous, Continuous, Deveshwar, Sanjeev, MD, Last Rate: 8 mL/hr at 12/03/20 0600, 4 mg/hr at 12/03/20 0600   feeding supplement (PROSource TF) liquid 45 mL, 45 mL, Per Tube, BID, Hunsucker, MaBonna GainsMD   feeding supplement (VITAL HIGH PROTEIN) liquid  1,000 mL, 1,000 mL, Per Tube, Q24H, Hunsucker, MaBonna GainsMD   fentaNYL (SUBLIMAZE) injection 50 mcg, 50 mcg, Intravenous, Q1H PRN, Hunsucker, MaBonna GainsMD   furosemide (LASIX) injection 20 mg, 20 mg, Intravenous, Once, Hunsucker, MaBonna GainsMD   ipratropium-albuterol (DUONEB) 0.5-2.5 (3) MG/3ML nebulizer solution 3 mL, 3 mL, Nebulization, Q6H, BaSalvadore Dom, NP, 3 mL at 12/03/20 0730   metoprolol tartrate (LOPRESSOR) tablet 12.5 mg, 12.5 mg, Oral, Q6H, Hunsucker, MaBonna GainsMD   midazolam (VERSED) injection 2 mg, 2 mg, Intravenous, Q2H PRN, Hunsucker, MaBonna GainsMD   pantoprazole (PROTONIX) injection 40 mg, 40 mg, Intravenous, Daily, BaSalvadore Dom, NP, 40 mg at 12/02/20 2000   propofol (DIPRIVAN) 1000 MG/100ML infusion, 5-80 mcg/kg/min, Intravenous, Titrated, SoAnders SimmondsMD, Last Rate: 2.7 mL/hr at 12/03/20 0600, 6 mcg/kg/min at 12/03/20 0600   rosuvastatin (CRESTOR) tablet 20 mg, 20 mg, Per Tube, Daily, Christian, Rylee, MD   senna-docusate (Senokot-S) tablet 1 tablet, 1 tablet, Oral, QHS PRN, ToRikki SpearingNP  Labs: CBC Recent Labs    12/02/20 1934 12/03/20 0329  WBC  --  10.7*  HGB 10.9* 10.4*  HCT 32.0* 32.2*  PLT  --  229   BMET Recent Labs    12/02/20 1934 12/03/20 0329  NA 140 139  K 4.5 4.2  CL  --  107  CO2  --  25  GLUCOSE  --  135*  BUN  --  20  CREATININE  --  1.17*  CALCIUM  --  8.3*   LFT Recent Labs    12/03/20 0329  PROT 5.9*  ALBUMIN 3.0*  AST 30  ALT 31  ALKPHOS 45  BILITOT 0.4   PT/INR No results for input(s): LABPROT, INR in the last 72 hours.   Studies/Results: Portable Chest x-ray  Result Date: 12/02/2020 CLINICAL DATA:  Endotracheal tube position EXAM: PORTABLE CHEST 1 VIEW COMPARISON:  11/30/2020 FINDINGS: Endotracheal tube tip is at the level of the clavicular heads. There is moderate cardiomegaly. This is likely exaggerated by supine positioning. Mild left-greater-than-right interstitial opacities, slightly worsened.  IMPRESSION: Endotracheal tube tip at the level of the clavicular heads. Worsening of left-greater-than-right interstitial opacities favored to indicate pulmonary edema, though pneumonia remains difficult to exclude. Electronically Signed   By: Ulyses Jarred M.D.   On: 12/02/2020 19:27    Assessment/Plan: [facility] CVA s/p angiogram with revascularization of occluded Lt MCA sup division with x 1 passwith 31m x 40 mm solitaireX retriever and contact aspiration and x1 pass with 372mx 32 mm trevo retriever and contact aspiration with TICI 2C revascularization. Looks good though still has some sedation on board and not yet extubated. MRI ordered.    LOS: 1 day   I spent a total of 15 minutes in face to face in clinical consultation, greater than 50% of which was counseling/coordinating care for CVA post intervention  KeAscencion DikeA-C 12/03/2020 8:33 AM

## 2020-12-03 NOTE — Progress Notes (Signed)
  Amiodarone Drug - Drug Interaction Consult Note  Recommendations: Monitor for bradycardia and muscle pain  Amiodarone is metabolized by the cytochrome P450 system and therefore has the potential to cause many drug interactions. Amiodarone has an average plasma half-life of 50 days (range 20 to 100 days).   There is potential for drug interactions to occur several weeks or months after stopping treatment and the onset of drug interactions may be slow after initiating amiodarone.   '[x]'$  Statins: Increased risk of myopathy. Simvastatin- restrict dose to '20mg'$  daily. Other statins: counsel patients to report any muscle pain or weakness immediately.  '[]'$  Anticoagulants: Amiodarone can increase anticoagulant effect. Consider warfarin dose reduction. Patients should be monitored closely and the dose of anticoagulant altered accordingly, remembering that amiodarone levels take several weeks to stabilize.  '[]'$  Antiepileptics: Amiodarone can increase plasma concentration of phenytoin, the dose should be reduced. Note that small changes in phenytoin dose can result in large changes in levels. Monitor patient and counsel on signs of toxicity.  '[x]'$  Beta blockers: increased risk of bradycardia, AV block and myocardial depression. Sotalol - avoid concomitant use.  '[]'$   Calcium channel blockers (diltiazem and verapamil): increased risk of bradycardia, AV block and myocardial depression.  '[]'$   Cyclosporine: Amiodarone increases levels of cyclosporine. Reduced dose of cyclosporine is recommended.  '[]'$  Digoxin dose should be halved when amiodarone is started.  '[]'$  Diuretics: increased risk of cardiotoxicity if hypokalemia occurs.  '[]'$  Oral hypoglycemic agents (glyburide, glipizide, glimepiride): increased risk of hypoglycemia. Patient's glucose levels should be monitored closely when initiating amiodarone therapy.   '[]'$  Drugs that prolong the QT interval:  Torsades de pointes risk may be increased with concurrent  use - avoid if possible.  Monitor QTc, also keep magnesium/potassium WNL if concurrent therapy can't be avoided.  Antibiotics: e.g. fluoroquinolones, erythromycin.  Antiarrhythmics: e.g. quinidine, procainamide, disopyramide, sotalol.  Antipsychotics: e.g. phenothiazines, haloperidol.   Lithium, tricyclic antidepressants, and methadone.   Thank You,  Corinda Gubler  12/03/2020 1:38 PM

## 2020-12-03 NOTE — Progress Notes (Signed)
Stroke MD addendum note:  Reviewed MRI scan of the brain which showed small patchy left frontal cortical nonhemorrhagic infarct. Patient has atrial fibrillation with rapid heart rate and is at high risk for recurrent emboli and stroke hence will start IV heparin per pharmacy stroke protocol.  If patient is able to swallow we will switch to anticoagulation with NOAC later.  Antony Contras, MD

## 2020-12-03 NOTE — Progress Notes (Addendum)
STROKE TEAM PROGRESS NOTE   INTERVAL HISTORY No significant intraop complications last evening. Post thrombectomy CT negative for bleed. Left intubated post op.  Had some agitation overnight and was started on propofol.  Continues to have some right upper extremity weakness. She is following commands. Unable to assess aphasia due to continued intubation.   CXR this morning showed some vascular congestion so critical care is planning on keeping her intubated for now and try diuresis..    Cortrak placed this afternoon.  MRI scheduled for this afternoon.   Vitals:   12/03/20 0400 12/03/20 0500 12/03/20 0600 12/03/20 0730  BP: 1'12/71 99/73 97/67 '$   Pulse: (!) 116 (!) 117 (!) 122   Resp: '16 17 16   '$ Temp:      TempSrc:      SpO2: 94% 95% 95% 93%  Weight:      Height:       CBC:  Recent Labs  Lab 12/02/20 1934 12/03/20 0329  WBC  --  10.7*  NEUTROABS  --  8.9*  HGB 10.9* 10.4*  HCT 32.0* 32.2*  MCV  --  93.9  PLT  --  [id number]   Basic Metabolic Panel:  Recent Labs  Lab 12/02/20 1934 12/03/20 0242 12/03/20 0329  NA 140  --  139  K 4.5  --  4.2  CL  --   --  107  CO2  --   --  25  GLUCOSE  --   --  135*  BUN  --   --  20  CREATININE  --   --  1.17*  CALCIUM  --   --  8.3*  MG  --  2.2  --    Lipid Panel:  Recent Labs  Lab 12/03/20 0330  CHOL 142  TRIG 107  100  HDL 40*  CHOLHDL 3.6  VLDL 21  LDLCALC 81   HgbA1c:  Recent Labs  Lab 12/03/20 0330  HGBA1C 6.2*   Urine Drug Screen:  Recent Labs  Lab 12/02/20 1840  LABOPIA NONE DETECTED  COCAINSCRNUR NONE DETECTED  LABBENZ NONE DETECTED  AMPHETMU NONE DETECTED  THCU NONE DETECTED  LABBARB NONE DETECTED      PHYSICAL EXAM General: critically ill appearing female on the ventilator and sedated Cardiac: tachycardic rate Pulm: on PRVC  with PEEP 5 and 50%FiO2 Neurologic exam Mental status: alert and following commands despite sedation and intubation Speech unable to be assessed due to intubation but  able to follow commands quite consistently in all 4 extremities Cranial nerves: EOMs intact. No obvious facial droop but difficult to assess with ETT. Tongue midline Motor: all extremities move to antigravity. RUE 3/5 strength.  With drift which is subtle pronator drift on the right. 5/5 strength in the bilateral lower extremities. Decreased fine motor movement in the right upper extremity. Sensation: intact and equal throughout all extremities Cerebellar: decreased speed with rapid alternating movements in the RUE.    ASSESSMENT/PLAN [name] is a 70 y.o. female with history of hypertension, hyperlipidemia, and prior TIA who presented as a code stroke from [facility] after being found with right sided weakness and aphasia earlier today while admitted for afib RVR. CTA revealed a left MCA LVO. tPA was not given as she was on eliquis. She was take for thrombectomy.  Left superior division MCA infarct s/p thrombectomy 12/02/20. Suspect occlusion was likely from atrial fibrillation despite anticoagulation with eliquis Other stroke risk factors include obesity, age, hypertension MRI  pending Echocardiogram:  no thrombus LDL 81 HgbA1c 6.2 Plan Hold anticoagulation pending completion of follow up head imaging  Systolic blood pressure goal 130-140mHg. Now requiring pressor support with neo Nutrition: cortrak placed today. Start TF PT/OT/ST Disposition:  neuro ICU  Hyperlipidemia LDL 81, goal < 70 Start crestor '20mg'$  daily  Acute hypoxic respiratory failure requiring mechanical ventilation Remains on the vent this morning. CXR shows some pulmonary edema. Desatted with SBT today. Pulmonary tolieting, lasix started Daily SBTs Sedation per critical care  Afib RVR, HFmrEF likely due to RVR Started on amiodarone this morning Management per critical care  Chronic anemia. Hemoglobin stable. Continue to monitor.   Hospital day # 1[place] MD Internal Medicine  Resident PGY-3 [facility] PontesInternal Medicine Residency Pager: #[phone]/22/2022 8:28 AM    I have personally obtained history,examined this patient, reviewed notes, independently viewed imaging studies, participated in medical decision making and plan of care.ROS completed by me personally and pertinent positives fully documented  I have made any additions or clarifications directly to the above note. Agree with note above.  Continue close neurological follow-up and strict blood pressure control with systolic goal 10000000for the first 24 hours as per post intervention protocol.  Continue   ventilatory support for respiratory failure as per critical care team and wean as tolerated.  Check MRI scan of the brain later today and if no bleed resume anticoagulation with IV heparin till she is able to swallow..  No family available at the bedside for discussion.  Discussed with Dr. HSilas Floodcritical care medicine and Dr. DEstanislado PandyThis patient is critically ill and at significant risk of neurological worsening, death and care requires constant monitoring of vital signs, hemodynamics,respiratory and cardiac monitoring, extensive review of multiple databases, frequent neurological assessment, discussion with family, other specialists and medical decision making of high complexity.I have made any additions or clarifications directly to the above note.This critical care time does not reflect procedure time, or teaching time or supervisory time of PA/NP/Med Resident etc but could involve care discussion time.  I spent 30 minutes of neurocritical care time  in the care of  this patient.      PAntony Contras MD Medical Director MCoshoctonPager: [phone]/22/2022 3:09 PM  To contact Stroke Continuity provider, please refer to [email or web address] After hours, contact General Neurology

## 2020-12-03 NOTE — Progress Notes (Signed)
Paged IR regarding soft blood pressures less than 120, awaiting call back.

## 2020-12-04 DIAGNOSIS — I639 Cerebral infarction, unspecified: Secondary | ICD-10-CM | POA: Diagnosis not present

## 2020-12-04 DIAGNOSIS — I63412 Cerebral infarction due to embolism of left middle cerebral artery: Principal | ICD-10-CM

## 2020-12-04 DIAGNOSIS — Z978 Presence of other specified devices: Secondary | ICD-10-CM

## 2020-12-04 DIAGNOSIS — I6602 Occlusion and stenosis of left middle cerebral artery: Secondary | ICD-10-CM | POA: Diagnosis not present

## 2020-12-04 DIAGNOSIS — I4891 Unspecified atrial fibrillation: Secondary | ICD-10-CM

## 2020-12-04 LAB — CBC
HCT: 36.1 % (ref 36.0–46.0)
HCT: 34.3 % — ABNORMAL LOW (ref 36.0–46.0)
Hemoglobin: 11 g/dL — ABNORMAL LOW (ref 12.0–15.0)
Hemoglobin: 11.1 g/dL — ABNORMAL LOW (ref 12.0–15.0)
MCH: 29.5 pg (ref 26.0–34.0)
MCH: 30.2 pg (ref 26.0–34.0)
MCHC: 30.5 g/dL (ref 30.0–36.0)
MCHC: 32.4 g/dL (ref 30.0–36.0)
MCV: 96.8 fL (ref 80.0–100.0)
MCV: 93.5 fL (ref 80.0–100.0)
Platelets: UNDETERMINED 10*3/uL (ref 150–400)
Platelets: 247 10*3/uL (ref 150–400)
RBC: 3.73 MIL/uL — ABNORMAL LOW (ref 3.87–5.11)
RBC: 3.67 MIL/uL — ABNORMAL LOW (ref 3.87–5.11)
RDW: 14.3 % (ref 11.5–15.5)
RDW: 14.1 % (ref 11.5–15.5)
WBC: 11.5 10*3/uL — ABNORMAL HIGH (ref 4.0–10.5)
WBC: 12.8 10*3/uL — ABNORMAL HIGH (ref 4.0–10.5)
nRBC: 0 % (ref 0.0–0.2)
nRBC: 0 % (ref 0.0–0.2)

## 2020-12-04 LAB — BASIC METABOLIC PANEL
Anion gap: 6 (ref 5–15)
Anion gap: 9 (ref 5–15)
BUN: 24 mg/dL — ABNORMAL HIGH (ref 8–23)
BUN: 24 mg/dL — ABNORMAL HIGH (ref 8–23)
CO2: 25 mmol/L (ref 22–32)
CO2: 29 mmol/L (ref 22–32)
Calcium: 8.3 mg/dL — ABNORMAL LOW (ref 8.9–10.3)
Calcium: 8.9 mg/dL (ref 8.9–10.3)
Chloride: 110 mmol/L (ref 98–111)
Chloride: 102 mmol/L (ref 98–111)
Creatinine, Ser: 0.92 mg/dL (ref 0.44–1.00)
Creatinine, Ser: 1.04 mg/dL — ABNORMAL HIGH (ref 0.44–1.00)
GFR, Estimated: >60 mL/min (ref >60)
GFR, Estimated: 57 mL/min — ABNORMAL LOW (ref >60)
Glucose, Bld: 156 mg/dL — ABNORMAL HIGH (ref 70–99)
Glucose, Bld: 156 mg/dL — ABNORMAL HIGH (ref 70–99)
Potassium: 4.2 mmol/L (ref 3.5–5.1)
Potassium: 3.8 mmol/L (ref 3.5–5.1)
Sodium: 141 mmol/L (ref 135–145)
Sodium: 140 mmol/L (ref 135–145)

## 2020-12-04 LAB — GLUCOSE, CAPILLARY
Glucose-Capillary: 144 mg/dL — ABNORMAL HIGH (ref 70–99)
Glucose-Capillary: 174 mg/dL — ABNORMAL HIGH (ref 70–99)
Glucose-Capillary: 173 mg/dL — ABNORMAL HIGH (ref 70–99)
Glucose-Capillary: 166 mg/dL — ABNORMAL HIGH (ref 70–99)
Glucose-Capillary: 124 mg/dL — ABNORMAL HIGH (ref 70–99)
Glucose-Capillary: 145 mg/dL — ABNORMAL HIGH (ref 70–99)

## 2020-12-04 LAB — APTT
aPTT: 28 seconds (ref 24–36)
aPTT: 34 seconds (ref 24–36)
aPTT: 45 seconds — ABNORMAL HIGH (ref 24–36)

## 2020-12-04 LAB — HEPARIN LEVEL (UNFRACTIONATED)
Heparin Unfractionated: 0.17 IU/mL — ABNORMAL LOW (ref 0.30–0.70)
Heparin Unfractionated: 0.22 IU/mL — ABNORMAL LOW (ref 0.30–0.70)

## 2020-12-04 MED ORDER — DILTIAZEM HCL-DEXTROSE 125-5 MG/125ML-% IV SOLN (PREMIX)
5.0000 mg/h | INTRAVENOUS | Status: DC
  Administered 2020-12-04: 20 mg/h via INTRAVENOUS
  Administered 2020-12-04: 5 mg/h via INTRAVENOUS
  Administered 2020-12-05: 10 mg/h via INTRAVENOUS
  Filled 2020-12-04 (×4): qty 125

## 2020-12-04 MED ORDER — DIGOXIN 0.25 MG/ML IJ SOLN
0.2500 mg | Freq: Every day | INTRAMUSCULAR | Status: AC
  Administered 2020-12-04 – 2020-12-05 (×2): 0.25 mg via INTRAVENOUS
  Filled 2020-12-04 (×2): qty 2

## 2020-12-04 MED ORDER — FUROSEMIDE 10 MG/ML IJ SOLN
40.0000 mg | Freq: Once | INTRAMUSCULAR | Status: AC
  Administered 2020-12-04: 40 mg via INTRAVENOUS
  Filled 2020-12-04: qty 4

## 2020-12-04 MED ORDER — LEVOTHYROXINE SODIUM 100 MCG PO TABS
100.0000 ug | ORAL_TABLET | Freq: Every day | ORAL | Status: DC
  Administered 2020-12-05 – 2020-12-08 (×4): 100 ug
  Filled 2020-12-04 (×4): qty 1

## 2020-12-04 MED ORDER — METOPROLOL TARTRATE 25 MG PO TABS: 12.5000 mg | ORAL_TABLET | Freq: Two times a day (BID) | ORAL | Status: DC

## 2020-12-04 MED ORDER — LEVALBUTEROL HCL 0.63 MG/3ML IN NEBU: 0.6300 mg | INHALATION_SOLUTION | Freq: Four times a day (QID) | RESPIRATORY_TRACT | Status: DC | PRN

## 2020-12-04 MED ORDER — FUROSEMIDE 10 MG/ML IJ SOLN: 40.0000 mg | Freq: Once | INTRAMUSCULAR | Status: DC

## 2020-12-04 MED ORDER — MUPIROCIN 2 % EX OINT
1.0000 "application " | TOPICAL_OINTMENT | Freq: Two times a day (BID) | CUTANEOUS | Status: AC
  Administered 2020-12-04 – 2020-12-08 (×10): 1 via NASAL
  Filled 2020-12-04: qty 22

## 2020-12-04 MED ORDER — FUROSEMIDE 10 MG/ML IJ SOLN
40.0000 mg | Freq: Once | INTRAMUSCULAR | Status: AC
  Administered 2020-12-04: 40 mg via INTRAVENOUS

## 2020-12-04 MED ORDER — METOPROLOL TARTRATE 25 MG PO TABS: 25.0000 mg | ORAL_TABLET | Freq: Three times a day (TID) | ORAL | Status: DC

## 2020-12-04 MED ORDER — METOPROLOL TARTRATE 5 MG/5ML IV SOLN
5.0000 mg | Freq: Once | INTRAVENOUS | Status: AC
  Administered 2020-12-04: 5 mg via INTRAVENOUS
  Filled 2020-12-04: qty 5

## 2020-12-04 MED ORDER — METOPROLOL TARTRATE 25 MG PO TABS
25.0000 mg | ORAL_TABLET | Freq: Three times a day (TID) | ORAL | Status: DC
  Administered 2020-12-04 – 2020-12-08 (×12): 25 mg
  Filled 2020-12-04 (×12): qty 1

## 2020-12-04 NOTE — Progress Notes (Addendum)
ANTICOAGULATION CONSULT NOTE - Follow Up Consult  Pharmacy Consult for heparin Indication: atrial fibrillation and stroke with high risk for recurrent emboli/stroke   Labs: Recent Labs    12/02/20 1934 12/03/20 0329 12/04/20 0224  HGB 10.9* 10.4*  --   HCT 32.0* 32.2*  --   PLT  --  229  --   APTT  --   --  28  HEPARINUNFRC  --   --  0.17*  CREATININE  --  1.17*  --     Assessment: 70yo female subtherapeutic on heparin with initial dosing for Afib and stroke; no gtt issues or signs of bleeding per RN.  Goal of Therapy:  Heparin level 0.3-0.5 units/ml aPTT 66-85 seconds   Plan:  Will increase heparin gtt by 3 units/kg/hr to 1500 units/hr and check level in 6 hours.    Candice Perez, PharmD, BCPS  12/04/2020,3:03 AM

## 2020-12-04 NOTE — Evaluation (Signed)
Occupational Therapy Evaluation Patient Details Name: Candice Perez MRN: [id number] DOB: Oct 30, 1950 Today's Date: 12/04/2020    History of Present Illness The pt is a 70 yo female presenting to [facility] on 7/21 due to new onset afib with RVR, but required transfer to [facility] from [place] via EMS on 7/21 after acute onset aphasia and R sided weakness. The pt is now s/p revascularization of L MCA on 7/21. PMH includes: HTN, HLD, TIA, stoke, DVT, depression, and GERD.   Clinical Impression   PT admitted with LMCA with revascularization. Pt currently with functional limitiations due to the deficits listed below (see OT problem list). Pt currently requires total +2 (A) for bed mobility with body habitus limiting positioning. Pt with R UE weakness. Pt is able to point consistently to correct answers with 3-4 choices. Pt also attempting to write answers with delays to questions but correctly. Pt writing birth date with multiple errors but shaking head to show awareness.  Pt will benefit from skilled OT to increase their independence and safety with adls and balance to allow discharge CIR.     Follow Up Recommendations  CIR    Equipment Recommendations  Hospital bed;Wheelchair cushion (measurements OT);Wheelchair (measurements OT)    Recommendations for Other Services Rehab consult;Speech consult     Precautions / Restrictions Precautions Precautions: Fall Precaution Comments: HHFNC, watch HR Restrictions Weight Bearing Restrictions: No      Mobility Bed Mobility Overal bed mobility: Needs Assistance Bed Mobility: Rolling Rolling: Max assist;+2 for physical assistance         General bed mobility comments: pt attempting to initiate movement with BLE and reaching with UE when given max cues and facilitation of reaching across her body to roll. the pt was able to demo good pushing with LE, but needing increased assist to complete roll due to limited space in bed     Transfers                 General transfer comment: deferred due to HR elevation to150s with rolling in bed    Balance                                           ADL either performed or assessed with clinical judgement   ADL Overall ADL's : Needs assistance/impaired Eating/Feeding: NPO   Grooming: Maximal assistance       Lower Body Bathing: Total assistance                         General ADL Comments: pt incontinence and lack of awareness. pt with voiding of urine into pure wick of >300 cc and not awareness. pt shaking head No when asked if she coudl feel it happening     Vision   Additional Comments: to be further assessed but able to track and scan notebook . pt does write letters backward or reversed. B instead of P     Perception     Praxis      Pertinent Vitals/Pain Pain Assessment: No/denies pain     Hand Dominance Right   Extremity/Trunk Assessment Upper Extremity Assessment Upper Extremity Assessment: RUE deficits/detail RUE Deficits / Details: pt able to complete shoulder flexion , elbow flexion, wrist flexion with decrease digit flexion. pt unableto elevate completely against gravity. Weakness noted. Pt with decrease grasp compared to L UE.  Pt is R dominant RUE Sensation: decreased light touch RUE Coordination: decreased fine motor;decreased gross motor   Lower Extremity Assessment Lower Extremity Assessment: Defer to PT evaluation   Cervical / Trunk Assessment Cervical / Trunk Assessment: Other exceptions Cervical / Trunk Exceptions: large body habitus   Communication Communication Communication: Expressive difficulties   Cognition Arousal/Alertness: Awake/alert Behavior During Therapy: WFL for tasks assessed/performed Overall Cognitive Status: Impaired/Different from baseline Area of Impairment: Following commands;Safety/judgement;Awareness;Problem solving                       Following Commands:  Follows one step commands inconsistently;Follows one step commands with increased time (and visual cues) Safety/Judgement: Decreased awareness of safety Awareness: Intellectual Problem Solving: Slow processing;Decreased initiation;Difficulty sequencing;Requires verbal cues;Requires tactile cues (visual cues) General Comments: pt able to answer yes/no questions consistently by pointing to printed options. pt able to mimic movements consistently when given visual cues, but needing increased time and cues when given verbal cues only.   General Comments  HR 120-160 35L heated high flow Howard with FIO2 100%    Exercises Exercises: General Lower Extremity General Exercises - Lower Extremity Ankle Circles/Pumps: AROM;Both;15 reps;Supine Heel Slides: AROM;Both;5 reps;Supine Hip ABduction/ADduction: AROM;Both;5 reps;Supine Straight Leg Raises: AROM;Both;5 reps;Supine   Shoulder Instructions      Home Living Family/patient expects to be discharged to:: Private residence Living Arrangements: Spouse/significant other Available Help at Discharge: Family;Available 24 hours/day Type of Home: House Home Access: Level entry     Home Layout: One level     Bathroom Shower/Tub: Occupational psychologist: Standard     Home Equipment: Crutches;Hand held shower head   Additional Comments: no pets      Prior Functioning/Environment Level of Independence: Independent        Comments: retired, enjoys gardening and reading, prescription glassess at baseline        OT Problem List: Decreased strength;Decreased range of motion;Decreased activity tolerance;Impaired vision/perception;Decreased coordination;Impaired balance (sitting and/or standing);Decreased cognition;Decreased safety awareness;Decreased knowledge of use of DME or AE;Decreased knowledge of precautions;Cardiopulmonary status limiting activity;Obesity      OT Treatment/Interventions: Self-care/ADL training;Therapeutic  exercise;Neuromuscular education;Energy conservation;DME and/or AE instruction;Manual therapy;Therapeutic activities;Cognitive remediation/compensation;Visual/perceptual remediation/compensation;Patient/family education;Balance training    OT Goals(Current goals can be found in the care plan section) Acute Rehab OT Goals Patient Stated Goal: to go home OT Goal Formulation: With patient Time For Goal Achievement: 12/18/20 Potential to Achieve Goals: Good  OT Frequency: Min 2X/week   Barriers to D/C:            Co-evaluation PT/OT/SLP Co-Evaluation/Treatment: Yes Reason for Co-Treatment: Complexity of the patient's impairments (multi-system involvement);For patient/therapist safety;To address functional/ADL transfers;Necessary to address cognition/behavior during functional activity PT goals addressed during session: Mobility/safety with mobility;Balance;Strengthening/ROM OT goals addressed during session: ADL's and self-care      AM-PAC OT "6 Clicks" Daily Activity     Outcome Measure Help from another person eating meals?: A Lot Help from another person taking care of personal grooming?: A Lot Help from another person toileting, which includes using toliet, bedpan, or urinal?: Total Help from another person bathing (including washing, rinsing, drying)?: Total Help from another person to put on and taking off regular upper body clothing?: A Lot Help from another person to put on and taking off regular lower body clothing?: Total 6 Click Score: 9   End of Session Equipment Utilized During Treatment: Oxygen Nurse Communication: Mobility status;Precautions  Activity Tolerance: Patient tolerated treatment well Patient left: in bed;with  call bell/phone within reach  OT Visit Diagnosis: Unsteadiness on feet (R26.81);Muscle weakness (generalized) (M62.81);Hemiplegia and hemiparesis Hemiplegia - Right/Left: Right Hemiplegia - dominant/non-dominant: Dominant Hemiplegia - caused by:  Cerebral infarction                Time: [id number] OT Time Calculation (min): 31 min Charges:  OT General Charges $OT Visit: 1 Visit OT Evaluation $OT Eval Moderate Complexity: 1 Mod   Brynn, OTR/L  Acute Rehabilitation Services Pager: [phone] Office: [phone] .   Jeri Modena 12/04/2020, 2:51 PM

## 2020-12-04 NOTE — Progress Notes (Signed)
NAME:  Candice Perez, MRN:  [id number], DOB:  04/22/1951, LOS: 2 ADMISSION DATE:  12/02/2020, CONSULTATION DATE:  7/21 REFERRING MD:  Estanislado Pandy, CHIEF COMPLAINT:  respiratory failure and vent management s/p acute stroke    History of Present Illness:  This is a 70 year old female who was admitted to [facility] on 7/21 with new Onset atrial fibrillation with RVR.  She has been placed on oral DOAC.  Shortly after arriving she developed rather acute onset aphasia, right-sided weakness and a code stroke was activated.  Stat CT was obtained this was negative for hemorrhage but revealed area concerning left MCA with large volume occlusion tPA was not given as she was on Eliquis she was emergently transferred to [facility] for neuroradiology intervention.  After arrival at [facility] she was directly admitted to interventional radiology where she underwent revascularization of the left MCA with clot extraction.  She was left intubated postprocedure as she had global aphasia preoperatively critical care asked to evaluate and assist with intensive care management in addition to ventilator management.  Pertinent  Medical History  HTN, HL, prior stroke, prior DVT.  Hypothyroidism, depression.  GERD.   Significant Hospital Events: Including procedures, antibiotic start and stop dates in addition to other pertinent events   Initially presented to [facility] with new atrial fib with RVR started on Eliquis.  Shortly after admission developed new global aphasia and right-sided weakness found to have large volume occlusion of the left MCA territory transferred urgently to [facility] where she underwent revascularization interventional radiology.  Return to the intensive care on mechanical ventilator critical care asked to assist with management 7/22 failed SBT, Afib RVR, hypotension, A line rpelaced and pressure sbeeter than cuff, phenyl weaned off, RVR persisted so amio started, diuresed, outs not recorded  accurately 7/23 stop maintenance IVF as overloaded and has Tfs. Still RVR, passing SBT, labs pending but would benefit from more diuresis if kidneys and electrolytes allow  Interim History / Subjective:  Follows commands all extremities.    Objective   Blood pressure 103/74, pulse (!) 129, temperature 98.9 F (37.2 C), temperature source Axillary, resp. rate 18, height '5\' 7"'$  (1.702 m), weight 134 kg, SpO2 93 %.    Vent Mode: PSV;CPAP FiO2 (%):  [50 %-60 %] 50 % Set Rate:  [16 bmp] 16 bmp Vt Set:  [490 mL] 490 mL PEEP:  [5 cmH20-8 cmH20] 5 cmH20 Pressure Support:  [5 cmH20] 5 cmH20 Plateau Pressure:  [13 cmH20-15 cmH20] 15 cmH20   Intake/Output Summary (Last 24 hours) at 12/04/2020 0855 Last data filed at 12/04/2020 0700 Gross per 24 hour  Intake 4160.14 ml  Output 400 ml  Net 3760.14 ml    Filed Weights   12/02/20 2000 12/04/20 0500  Weight: 133.8 kg 134 kg    Examination: General: Obese white female currently sedated requiring mechanical ventilation HENT: Neck is large orally intubated pupils equal reactive Lungs: coarse, ventilated Cardiovascular: Atrial fibrillation, heart rate in the 120s no murmur rub or gallop Abdomen: Abdomen large soft hypoactive bowel sounds Extremities: LE edema present Neuro: Moves all extremities against gravity.  Nods yes no.  Resolved Hospital Problem list     Assessment & Plan:  Acute left MCA stroke(POA) with global aphasia and right-sided weakness, now status post revascularization in interventional radiology -Prior history of stroke Plan Supportive care Serial neuro checks Maintain systolic blood pressure [id number] - 140 Follow-up secondary stroke prevention to be outlined by stroke team, follow-up MRI completed  Acute respiratory failure secondary  to ineffective airway clearance after large acute stroke: Desatted with SBT. Pulmonary edema on CXR. Net postiive 7/22 with day outs not recorded but reportedly had good UOP Plan PRVC, passed  SBT tentative plan to extubate early PM to HFNC Lasix pending lab results VAP bundle PAD protocol with RASS goal -1  Atrial fibrillation with rapid ventricular response (present on admission at [facility]) -in RVR, likely contributing to volume overload, still RVR despite starting amio gtt Plan Metoprolol 12.5 q12 hrs, increase as needed Amio gtt Echocardiogram with reduced EF in setting of RVR hard to interpret, valves look ok Heparin gtt  History of GERD PPI  History of hypothyroidism Synthroid  History of hypertension Holding losartan /hydrochlorothiazide Metoprolol for RVR as above  History of depression Add Wellbutrin when able  Best Practice (right click and "Reselect all SmartList Selections" daily)   Diet/type: TF DVT prophylaxis: SCD GI prophylaxis: PPI Lines: N/A Foley:  Yes, and it is still needed Code Status:  full code Last date of multidisciplinary goals of care discussion [per primary ]  Labs   CBC: Recent Labs  Lab 12/02/20 1934 12/03/20 0329  WBC  --  10.7*  NEUTROABS  --  8.9*  HGB 10.9* 10.4*  HCT 32.0* 32.2*  MCV  --  93.9  PLT  --  229     Basic Metabolic Panel: Recent Labs  Lab 12/02/20 1934 12/03/20 0242 12/03/20 0329  NA 140  --  139  K 4.5  --  4.2  CL  --   --  107  CO2  --   --  25  GLUCOSE  --   --  135*  BUN  --   --  20  CREATININE  --   --  1.17*  CALCIUM  --   --  8.3*  MG  --  2.2  --     GFR: Estimated Creatinine Clearance: 62.2 mL/min (A) (by C-G formula based on SCr of 1.17 mg/dL (H)). Recent Labs  Lab 12/03/20 0329  WBC 10.7*     Liver Function Tests: Recent Labs  Lab 12/03/20 0329  AST 30  ALT 31  ALKPHOS 45  BILITOT 0.4  PROT 5.9*  ALBUMIN 3.0*    No results for input(s): LIPASE, AMYLASE in the last 168 hours. No results for input(s): AMMONIA in the last 168 hours.  ABG    Component Value Date/Time   PHART 7.294 (L) 12/02/2020 1934   PCO2ART 53.6 (H) 12/02/2020 1934   PO2ART 172 (H)  12/02/2020 1934   HCO3 26.3 12/02/2020 1934   TCO2 28 12/02/2020 1934   ACIDBASEDEF 1.0 12/02/2020 1934   O2SAT 99.0 12/02/2020 1934     Coagulation Profile: No results for input(s): INR, PROTIME in the last 168 hours.  Cardiac Enzymes: No results for input(s): CKTOTAL, CKMB, CKMBINDEX, TROPONINI in the last 168 hours.  HbA1C: Hgb A1c MFr Bld  Date/Time Value Ref Range Status  12/03/2020 03:30 AM 6.2 (H) 4.8 - 5.6 % Final    Comment:    (NOTE) Pre diabetes:          5.7%-6.4%  Diabetes:              >6.4%  Glycemic control for   <7.0% adults with diabetes   06/15/2017 09:30 AM 6.0 (H) 4.8 - 5.6 % Final    Comment:             Prediabetes: 5.7 - 6.4  Diabetes: >6.4          Glycemic control for adults with diabetes: <7.0     CBG: Recent Labs  Lab 12/03/20 1647 12/03/20 2047 12/03/20 2329 12/04/20 0406 12/04/20 0728  GLUCAP 163* 145* 199* 144* 174*     Review of Systems:   Not able   Past Medical History:  She,  has a past medical history of Arthritis, DVT (deep venous thrombosis) ([place]), Edema, GERD (gastroesophageal reflux disease), Hyperlipidemia, Hypertension, Hypothyroidism, and Varicose veins.   Surgical History:   Past Surgical History:  Procedure Laterality Date   CHOLECYSTECTOMY     Laparoscopic   CYSTOCELE REPAIR     HERNIA REPAIR     IR CT HEAD LTD  12/02/2020   IR PERCUTANEOUS ART THROMBECTOMY/INFUSION INTRACRANIAL INC DIAG ANGIO  12/02/2020   RADIOLOGY WITH ANESTHESIA N/A 12/02/2020   Procedure: IR WITH ANESTHESIA;  Surgeon: Luanne Bras, MD;  Location: [place];  Service: Radiology;  Laterality: N/A;   TONSILLECTOMY     TOTAL KNEE ARTHROPLASTY Left 08/26/2013   Procedure: LEFT TOTAL KNEE REVISION;  Surgeon: Mauri Pole, MD;  Location: [facility];  Service: Orthopedics;  Laterality: Left;   VARICOSE VEIN SURGERY       Social History:   reports that she quit smoking about 10 years ago. Her smoking use included cigarettes. She has  never used smokeless tobacco. She reports that she does not drink alcohol and does not use drugs.   Family History:  Her family history includes Cancer in her father and mother; Congestive Heart Failure in her mother; Other in her father.   Allergies No Known Allergies   Home Medications  Prior to Admission medications   Medication Sig Start Date End Date Taking? Authorizing Provider  buPROPion (WELLBUTRIN XL) 150 MG 24 hr tablet Take 150 mg by mouth daily.    [provider]  clopidogrel (PLAVIX) 75 MG tablet Take 1 tablet (75 mg total) by mouth daily. 08/07/17   Marcial Pacas, MD  levothyroxine (SYNTHROID, LEVOTHROID) 100 MCG tablet Take 100 mcg by mouth daily before breakfast.    [provider]  losartan-hydrochlorothiazide (HYZAAR) 100-25 MG per tablet Take 1 tablet by mouth every morning.    [provider]     Critical care time    CRITICAL CARE Performed by: Lanier Clam   Total critical care time: 38 minutes  Critical care time was exclusive of separately billable procedures and treating other patients.  Critical care was necessary to treat or prevent imminent or life-threatening deterioration.  Critical care was time spent personally by me on the following activities: development of treatment plan with patient and/or surrogate as well as nursing, discussions with consultants, evaluation of patient's response to treatment, examination of patient, obtaining history from patient or surrogate, ordering and performing treatments and interventions, ordering and review of laboratory studies, ordering and review of radiographic studies, pulse oximetry and re-evaluation of patient's condition.   Lanier Clam, MD See Shea Evans for contact info

## 2020-12-04 NOTE — Evaluation (Signed)
Physical Therapy Evaluation Patient Details Name: Candice Perez MRN: [id number] DOB: [birth year] Today's Date: 12/04/2020   History of Present Illness  The pt is a 70 yo female presenting to [facility] on 7/21 due to new onset afib with RVR, but required transfer to [facility] from [place] via EMS on 7/21 after acute onset aphasia and R sided weakness. The pt is now s/p revascularization of L MCA on 7/21. PMH includes: HTN, HLD, TIA, stoke, DVT, depression, and GERD.   Clinical Impression  Pt in bed upon arrival of PT, agreeable to evaluation at this time. Prior to admission the pt was independent with all mobility, living in a home with level entry with her spouse. The pt now presents with limitations in functional mobility, activity tolerance, strength, coordination, and stability due to above dx, and will continue to benefit from skilled PT to address these deficits. The pt was able to assist with bed mobility during today's session, improved mobility with mod-max cues and assist to initiate movements and then pt attempting to complete. +2 necessary to safely complete bed mobility and repositioning at this time. Further OOB mobility deferred due to HR elevation to 150s with bed-level mobility. Will continue to benefit from skilled PT to progress functional strength, activity tolerance, and further assess DME needs/OOB mobility.     Follow Up Recommendations CIR    Equipment Recommendations   (defer to post acute)    Recommendations for Other Services Rehab consult     Precautions / Restrictions Precautions Precautions: Fall Precaution Comments: HHFNC, watch HR Restrictions Weight Bearing Restrictions: No      Mobility  Bed Mobility Overal bed mobility: Needs Assistance Bed Mobility: Rolling Rolling: Max assist;+2 for physical assistance         General bed mobility comments: pt attempting to initiate movement with BLE and reaching with UE when given max cues and  facilitation of reaching across her body to roll. the pt was able to demo good pushing with LE, but needing increased assist to complete roll due to limited space in bed    Transfers                 General transfer comment: deferred due to HR elevation to150s with rolling in bed  Ambulation/Gait                Stairs            Wheelchair Mobility    Modified Rankin (Stroke Patients Only) Modified Rankin (Stroke Patients Only) Pre-Morbid Rankin Score: No symptoms Modified Rankin: Severe disability     Balance                                             Pertinent Vitals/Pain Pain Assessment: No/denies pain    Home Living Family/patient expects to be discharged to:: Private residence Living Arrangements: Spouse/significant other Available Help at Discharge: Family;Available 24 hours/day Type of Home: House Home Access: Level entry     Home Layout: One level Home Equipment: Crutches;Hand held shower head Additional Comments: no pets    Prior Function Level of Independence: Independent         Comments: retired, enjoys gardening and reading, prescription glassess at baseline     Hand Dominance   Dominant Hand: Right    Extremity/Trunk Assessment   Upper Extremity Assessment Upper Extremity Assessment: Defer to OT  evaluation    Lower Extremity Assessment Lower Extremity Assessment: Generalized weakness (pt unable to state difference in sensation at this time with reliable answer. able to move BLE against gravity and mod resistance)    Cervical / Trunk Assessment Cervical / Trunk Assessment: Other exceptions Cervical / Trunk Exceptions: large body habitus  Communication   Communication: Expressive difficulties  Cognition Arousal/Alertness: Awake/alert Behavior During Therapy: WFL for tasks assessed/performed Overall Cognitive Status: Impaired/Different from baseline Area of Impairment: Following  commands;Safety/judgement;Awareness;Problem solving                       Following Commands: Follows one step commands inconsistently;Follows one step commands with increased time (and visual cues) Safety/Judgement: Decreased awareness of safety Awareness: Intellectual Problem Solving: Slow processing;Decreased initiation;Difficulty sequencing;Requires verbal cues;Requires tactile cues (visual cues) General Comments: pt able to answer yes/no questions consistently by pointing to printed options. pt able to mimic movements consistently when given visual cues, but needing increased time and cues when given verbal cues only.      General Comments General comments (skin integrity, edema, etc.): HR 120s to 150s with bed-level mobility. SpO2 99-100% on 35L heated high flow [place] with FiO2 100%. BP remained stable with all changes in position    Exercises General Exercises - Lower Extremity Ankle Circles/Pumps: AROM;Both;15 reps;Supine Heel Slides: AROM;Both;5 reps;Supine Hip ABduction/ADduction: AROM;Both;5 reps;Supine Straight Leg Raises: AROM;Both;5 reps;Supine   Assessment/Plan    PT Assessment Patient needs continued PT services  PT Problem List Decreased strength;Decreased range of motion;Decreased activity tolerance;Decreased balance;Decreased mobility       PT Treatment Interventions DME instruction;Stair training;Gait training;Functional mobility training;Balance training;Therapeutic exercise;Therapeutic activities;Neuromuscular re-education;Patient/family education    PT Goals (Current goals can be found in the Care Plan section)  Acute Rehab PT Goals Patient Stated Goal: to go home PT Goal Formulation: With patient Time For Goal Achievement: 12/18/20 Potential to Achieve Goals: Good    Frequency Min 4X/week   Barriers to discharge        Co-evaluation PT/OT/SLP Co-Evaluation/Treatment: Yes Reason for Co-Treatment: Complexity of the patient's impairments  (multi-system involvement);Necessary to address cognition/behavior during functional activity;For patient/therapist safety;To address functional/ADL transfers PT goals addressed during session: Mobility/safety with mobility;Balance;Strengthening/ROM         AM-PAC PT "6 Clicks" Mobility  Outcome Measure Help needed turning from your back to your side while in a flat bed without using bedrails?: Total Help needed moving from lying on your back to sitting on the side of a flat bed without using bedrails?: Total Help needed moving to and from a bed to a chair (including a wheelchair)?: Total Help needed standing up from a chair using your arms (e.g., wheelchair or bedside chair)?: Total Help needed to walk in hospital room?: Total Help needed climbing 3-5 steps with a railing? : Total 6 Click Score: 6    End of Session Equipment Utilized During Treatment: Oxygen Activity Tolerance: Patient tolerated treatment well (HR elevation) Patient left: in bed;with call bell/phone within reach Nurse Communication: Mobility status PT Visit Diagnosis: Other abnormalities of gait and mobility (R26.89);Muscle weakness (generalized) (M62.81);Hemiplegia and hemiparesis Hemiplegia - Right/Left: Right Hemiplegia - dominant/non-dominant: Dominant Hemiplegia - caused by: Cerebral infarction    [id number] PT Time Calculation (min) (ACUTE ONLY): 32 min   Charges:   PT Evaluation $PT Eval High Complexity: 1 High          Thamar Holik Allen Kell, PT, DPT   Acute Rehabilitation Department Pager #: [phone]  Otho Bellows 12/04/2020, 2:19 PM

## 2020-12-04 NOTE — Procedures (Signed)
Extubation Procedure Note  Patient Details:   Name: Candice Perez DOB: Jul 28, 1950 MRN: [id number]   Airway Documentation:    Vent end date: 12/04/20 Vent end time: 1117   Evaluation  O2 sats: stable throughout Complications: No apparent complications Patient did tolerate procedure well. Bilateral Breath Sounds: Diminished  Pt able to speak: Yes  [name] 12/04/2020, 11:20 AM

## 2020-12-04 NOTE — Progress Notes (Signed)
ANTICOAGULATION CONSULT NOTE - Follow Up Consult  Pharmacy Consult for heparin Indication: atrial fibrillation  No Known Allergies  Patient Measurements: Height: '5\' 7"'$  (170.2 cm) Weight: 134 kg (295 lb 6.7 oz) IBW/kg (Calculated) : 61.6 Heparin Dosing Weight: 94kg  Vital Signs: Temp: 99.6 F (37.6 C) (07/23 2000) Temp Source: Oral (07/23 2000) BP: 108/88 (07/23 1800) Pulse Rate: 108 (07/23 2010)  Labs: Recent Labs    12/03/20 0329 12/04/20 0224 12/04/20 0930 12/04/20 1301 12/04/20 2145  HGB 10.4*  --   --  11.0* 11.1*  HCT 32.2*  --   --  36.1 34.3*  PLT 229  --   --  PLATELET CLUMPS NOTED ON SMEAR, UNABLE TO ESTIMATE 247  APTT  --  28 34  --  45*  HEPARINUNFRC  --  0.17* 0.22*  --   --   CREATININE 1.17*  --  0.92  --  1.04*     Estimated Creatinine Clearance: 69.9 mL/min (A) (by C-G formula based on SCr of 1.04 mg/dL (H)).   Medical History: Past Medical History:  Diagnosis Date   Arthritis    DVT (deep venous thrombosis) (HCC)    Edema    GERD (gastroesophageal reflux disease)    OCCASIONAL - PRILOSEC IF NEEDED   Hyperlipidemia    Hypertension    no meds now   Hypothyroidism    Varicose veins     Assessment: 59 YOF presenting as code stroke from [place], [place] on Eliquis started at [facility] (7/21 AM), s/p thrombectomy and ok per neuro for heparin gtt start with low end goals and no bolus.    aPTT level this evening remains SUBtherapeutic despite a rate increase earlier today though trending up (aPTT 45 << 34, goal of 66-102). CBC stable. No bleeding or infusion issues noted per RN.   Goal of Therapy:  Heparin level 0.3-0.5 units/ml aPTT 66-85 seconds Monitor platelets by anticoagulation protocol: Yes   Plan:  - Increase Heparin to 1900 units/hr (19 ml/hr) - Will continue to monitor for any signs/symptoms of bleeding and will follow up with heparin level and aPTT 8 hours  Thank you for allowing pharmacy to be a part of this patient's  care.  Alycia Rossetti, PharmD, BCPS Clinical Pharmacist Clinical phone for 12/04/2020: [phone] 12/04/2020 10:32 PM   **Pharmacist phone directory can now be found on [email or web address] ([email or web address]).  Listed under [place].

## 2020-12-04 NOTE — Progress Notes (Addendum)
ANTICOAGULATION CONSULT NOTE - Follow Up Consult  Pharmacy Consult for heparin Indication: atrial fibrillation  No Known Allergies  Patient Measurements: Height: '5\' 7"'$  (170.2 cm) Weight: 134 kg (295 lb 6.7 oz) IBW/kg (Calculated) : 61.6 Heparin Dosing Weight: 94kg  Vital Signs: Temp: 98.9 F (37.2 C) (07/23 0800) Temp Source: Axillary (07/23 0800) BP: 116/78 (07/23 0900) Pulse Rate: 120 (07/23 0900)  Labs: Recent Labs    12/02/20 1934 12/03/20 0329 12/04/20 0224 12/04/20 0930  HGB 10.9* 10.4*  --   --   HCT 32.0* 32.2*  --   --   PLT  --  229  --   --   APTT  --   --  28 34  HEPARINUNFRC  --   --  0.17* 0.22*  CREATININE  --  1.17*  --  0.92     Estimated Creatinine Clearance: 79.1 mL/min (by C-G formula based on SCr of 0.92 mg/dL).   Medical History: Past Medical History:  Diagnosis Date   Arthritis    DVT (deep venous thrombosis) (HCC)    Edema    GERD (gastroesophageal reflux disease)    OCCASIONAL - PRILOSEC IF NEEDED   Hyperlipidemia    Hypertension    no meds now   Hypothyroidism    Varicose veins     Assessment: 49 YOF presenting as code stroke from [place], [place] on Eliquis started at [facility] (7/21 AM), s/p thrombectomy and ok per neuro for heparin gtt start with low end goals and no bolus.    Heparin level 0.22 and aPTT 34 on heparin 1500 units/hr. Heparin level still impacted from previous apixaban. Per RN no issues with IV infusion or access. No bleeding noted. CBC not obtained this morning.   Goal of Therapy:  Heparin level 0.3-0.5 units/ml aPTT 66-85 seconds Monitor platelets by anticoagulation protocol: Yes   Plan:  Increase heparin to 1700 units/hr Check 8 hr aPTT Obtain CBC F/u ability to transition to oral anticoagulant   Cristela Felt, PharmD, BCPS Clinical Pharmacist 12/04/2020 11:33 AM  Addendum: Hgb 11.0. Plts clumped. Plts previously 229. No bleeding noted per RN. Will obtain CBC with next lab draw.   Cristela Felt, PharmD,  BCPS Clinical Pharmacist 12/04/2020 2:39 PM

## 2020-12-04 NOTE — Progress Notes (Signed)
   12/04/20 0737  Airway 7 mm  Placement Date/Time: 12/02/20 1628   Placed By: Anesthesiologist  Airway Device: Endotracheal Tube  Laryngoscope Blade: 4  ETT Types: Oral  Size (mm): 7 mm  Cuffed: Cuffed  Insertion attempts: 1  Airway Equipment: Stylet  Secured at (cm): 22 cm  Secured at (cm) 22 cm  Measured From Lips  Secured Location Right  Secured By Actuary Repositioned Yes  Prone position No  Cuff Pressure (cm H2O) Green OR 18-26 CmH2O  Site Condition Dry  Adult Ventilator Settings  Vent Type Servo U  Humidity HME  Vent Mode PSV;CPAP  FiO2 (%) 50 %  I Time 0.8 Sec(s)  Pressure Support 5 cmH20  PEEP 5 cmH20  Adult Ventilator Measurements  Peak Airway Pressure 11 L/min  Mean Airway Pressure 7 cmH20  Resp Rate Spontaneous 17 br/min  Resp Rate Total 17 br/min  Spont TV 610 mL  Measured Ve 9.7 mL  Auto PEEP 0 cmH20  Total PEEP 5 cmH20  SpO2 93 %  Adult Ventilator Alarms  Alarms On Y  Ve High Alarm 19 L/min  Ve Low Alarm 5 L/min  Resp Rate High Alarm 35 br/min  Resp Rate Low Alarm 10  PEEP Low Alarm 2 cmH2O  Press High Alarm 45 cmH2O  T Apnea 20 sec(s)  VAP Prevention  HME changed No  Ventilator changed No  Transported while on vent No  HOB> 30 Degrees Y  Equipment wiped down Yes  Daily Weaning Assessment  Daily Assessment of Readiness to Wean Wean protocol criteria met (SBT performed)  SBT Method CPAP 5 cm H20 and PS 5 cm H20  Weaning Start Time 0737  Breath Sounds  Bilateral Breath Sounds Diminished  Airway Suctioning/Secretions  Suction Type ETT  Suction Device  Catheter  Secretion Amount Moderate  Secretion Color Tan  Secretion Consistency Thick  Suction Tolerance Tolerated fairly well  Suctioning Adverse Effects Tachycardia

## 2020-12-04 NOTE — Consult Note (Signed)
Referring Physician: Brynda Peon, MD/Dr. Delice Lesch Candice Perez is an 70 y.o. female.                       Chief Complaint: Atrial fibrillation with RVR  HPI: 70 years old female with new onset atrial fibrillation and oral DOAC had acute stroke in left MCA area. She had revascularization of the left MCA with clot extraction. She was intubated post procedure but is now extubated and keeping saturation of 94 5 in upright position. She has PMH of HTN, prior stroke, DVT, hypothyroidism, depression and GERD. Echocardiogram was technically difficult due to tachycardia.  Past Medical History:  Diagnosis Date   Arthritis    DVT (deep venous thrombosis) (HCC)    Edema    GERD (gastroesophageal reflux disease)    OCCASIONAL - PRILOSEC IF NEEDED   Hyperlipidemia    Hypertension    no meds now   Hypothyroidism    Varicose veins       Past Surgical History:  Procedure Laterality Date   CHOLECYSTECTOMY     Laparoscopic   CYSTOCELE REPAIR     HERNIA REPAIR     IR CT HEAD LTD  12/02/2020   IR PERCUTANEOUS ART THROMBECTOMY/INFUSION INTRACRANIAL INC DIAG ANGIO  12/02/2020   RADIOLOGY WITH ANESTHESIA N/A 12/02/2020   Procedure: IR WITH ANESTHESIA;  Surgeon: Luanne Bras, MD;  Location: [facility];  Service: Radiology;  Laterality: N/A;   TONSILLECTOMY     TOTAL KNEE ARTHROPLASTY Left 08/26/2013   Procedure: LEFT TOTAL KNEE REVISION;  Surgeon: Mauri Pole, MD;  Location: [facility];  Service: Orthopedics;  Laterality: Left;   VARICOSE VEIN SURGERY      Family History  Problem Relation Age of Onset   Congestive Heart Failure Mother    Cancer Mother        tongue/sinus   Cancer Father        "in his back"   Other Father        Varicose veins, Arteriosclerosis   Social History:  reports that she quit smoking about 10 years ago. Her smoking use included cigarettes. She has never used smokeless tobacco. She reports that she does not drink alcohol and does not use drugs.  Allergies: No  Known Allergies  Medications Prior to Admission  Medication Sig Dispense Refill   amLODipine-valsartan (EXFORGE) 5-320 MG tablet Take 1 tablet by mouth daily.     ascorbic acid (VITAMIN C) 250 MG CHEW Chew 500 mg by mouth daily.     Biotin w/ Vitamins C & E (HAIR/SKIN/NAILS PO) Take 1 tablet by mouth daily.     buPROPion (WELLBUTRIN XL) 150 MG 24 hr tablet Take 150 mg by mouth daily.     Cholecalciferol (VITAMIN D3) 125 MCG (5000 UT) CAPS Take 1 capsule by mouth daily.     clopidogrel (PLAVIX) 75 MG tablet Take 1 tablet (75 mg total) by mouth daily. 90 tablet 3   Coenzyme Q10 (CO Q-10) 100 MG CAPS Take 1 tablet by mouth daily.     levothyroxine (SYNTHROID, LEVOTHROID) 100 MCG tablet Take 100 mcg by mouth daily before breakfast.     pyridOXINE (VITAMIN B-6) 100 MG tablet Take 100 mg by mouth daily.     zinc gluconate 50 MG tablet Take 50 mg by mouth daily.      Results for orders placed or performed during the hospital encounter of 12/02/20 (from the past 48 hour(s))  Resp Panel by RT-PCR (  Flu A&B, Covid) Nasopharyngeal Swab     Status: None   Collection Time: 12/02/20  4:22 PM   Specimen: Nasopharyngeal Swab; Nasopharyngeal(NP) swabs in vial transport medium  Result Value Ref Range   SARS Coronavirus 2 by RT PCR NEGATIVE NEGATIVE    Comment: (NOTE) SARS-CoV-2 target nucleic acids are NOT DETECTED.  The SARS-CoV-2 RNA is generally detectable in upper respiratory specimens during the acute phase of infection. The lowest concentration of SARS-CoV-2 viral copies this assay can detect is 138 copies/mL. A negative result does not preclude SARS-Cov-2 infection and should not be used as the sole basis for treatment or other patient management decisions. A negative result may occur with  improper specimen collection/handling, submission of specimen other than nasopharyngeal swab, presence of viral mutation(s) within the areas targeted by this assay, and inadequate number of viral copies(<138  copies/mL). A negative result must be combined with clinical observations, patient history, and epidemiological information. The expected result is Negative.  Fact Sheet for Patients:  EntrepreneurPulse.com.au  Fact Sheet for Healthcare Providers:  IncredibleEmployment.be  This test is no t yet approved or cleared by the [place] FDA and  has been authorized for detection and/or diagnosis of SARS-CoV-2 by FDA under an Emergency Use Authorization (EUA). This EUA will remain  in effect (meaning this test can be used) for the duration of the COVID-19 declaration under Section 564(b)(1) of the Act, 21 U.S.C.section 360bbb-3(b)(1), unless the authorization is terminated  or revoked sooner.       Influenza A by PCR NEGATIVE NEGATIVE   Influenza B by PCR NEGATIVE NEGATIVE    Comment: (NOTE) The Xpert Xpress SARS-CoV-2/FLU/RSV plus assay is intended as an aid in the diagnosis of influenza from Nasopharyngeal swab specimens and should not be used as a sole basis for treatment. Nasal washings and aspirates are unacceptable for Xpert Xpress SARS-CoV-2/FLU/RSV testing.  Fact Sheet for Patients: EntrepreneurPulse.com.au  Fact Sheet for Healthcare Providers: IncredibleEmployment.be  This test is not yet approved or cleared by the [place] FDA and has been authorized for detection and/or diagnosis of SARS-CoV-2 by FDA under an Emergency Use Authorization (EUA). This EUA will remain in effect (meaning this test can be used) for the duration of the COVID-19 declaration under Section 564(b)(1) of the Act, 21 U.S.C. section 360bbb-3(b)(1), unless the authorization is terminated or revoked.  Performed at [facility], [place] [address] [place], [place] [address]   MRSA Next Gen by PCR, Nasal     Status: Abnormal   Collection Time: 12/02/20  6:36 PM   Specimen: Nasal Mucosa; Nasal Swab  Result Value Ref  Range   MRSA by PCR Next Gen DETECTED (A) NOT DETECTED    Comment: RESULT CALLED TO, READ BACK BY AND VERIFIED WITH: HINES,A RN 12/02/20 AT 2214 SKEEN,P (NOTE) The GeneXpert MRSA Assay (FDA approved for NASAL specimens only), is one component of a comprehensive MRSA colonization surveillance program. It is not intended to diagnose MRSA infection nor to guide or monitor treatment for MRSA infections. Test performance is not FDA approved in patients less than 42 years old. Performed at [facility], [place] [address] [place], [place] [address]   Urinalysis, Complete w Microscopic Urine, Catheterized     Status: Abnormal   Collection Time: 12/02/20  6:40 PM  Result Value Ref Range   Color, Urine YELLOW YELLOW   APPearance CLOUDY (A) CLEAR   Specific Gravity, Urine 1.028 1.005 - 1.030   pH 5.0 5.0 - 8.0  Glucose, UA NEGATIVE NEGATIVE mg/dL   Hgb urine dipstick NEGATIVE NEGATIVE   Bilirubin Urine NEGATIVE NEGATIVE   Ketones, ur NEGATIVE NEGATIVE mg/dL   Protein, ur NEGATIVE NEGATIVE mg/dL   Nitrite POSITIVE (A) NEGATIVE   Leukocytes,Ua LARGE (A) NEGATIVE   RBC / HPF 0-5 0 - 5 RBC/hpf   WBC, UA >50 (H) 0 - 5 WBC/hpf   Bacteria, UA FEW (A) NONE SEEN   Squamous Epithelial / LPF 0-5 0 - 5   WBC Clumps PRESENT    Mucus PRESENT     Comment: Performed at [facility], [place] [address] [place], [place] [address]  Urine rapid drug screen (hosp performed)not at [facility]     Status: None   Collection Time: 12/02/20  6:40 PM  Result Value Ref Range   Opiates NONE DETECTED NONE DETECTED   Cocaine NONE DETECTED NONE DETECTED   Benzodiazepines NONE DETECTED NONE DETECTED   Amphetamines NONE DETECTED NONE DETECTED   Tetrahydrocannabinol NONE DETECTED NONE DETECTED   Barbiturates NONE DETECTED NONE DETECTED    Comment: (NOTE) DRUG SCREEN FOR MEDICAL PURPOSES ONLY.  IF CONFIRMATION IS NEEDED FOR ANY PURPOSE, NOTIFY LAB WITHIN 5 DAYS.  LOWEST DETECTABLE LIMITS FOR URINE DRUG  SCREEN Drug Class                     Cutoff (ng/mL) Amphetamine and metabolites    1000 Barbiturate and metabolites    200 Benzodiazepine                 [id number] Tricyclics and metabolites     300 Opiates and metabolites        300 Cocaine and metabolites        300 THC                            50 Performed at [facility], [place] [address] [place], [place] [address]   I-STAT 7, (LYTES, BLD GAS, ICA, H+H)     Status: Abnormal   Collection Time: 12/02/20  7:34 PM  Result Value Ref Range   pH, Arterial 7.294 (L) 7.350 - 7.450   pCO2 arterial 53.6 (H) 32.0 - 48.0 mmHg   pO2, Arterial 172 (H) 83.0 - 108.0 mmHg   Bicarbonate 26.3 20.0 - 28.0 mmol/L   TCO2 28 22 - 32 mmol/L   O2 Saturation 99.0 %   Acid-base deficit 1.0 0.0 - 2.0 mmol/L   Sodium 140 135 - 145 mmol/L   Potassium 4.5 3.5 - 5.1 mmol/L   Calcium, Ion 1.21 1.15 - 1.40 mmol/L   HCT 32.0 (L) 36.0 - 46.0 %   Hemoglobin 10.9 (L) 12.0 - 15.0 g/dL   Patient temperature 97.2 F    Collection site Radial    Drawn by VP    Sample type ARTERIAL   Magnesium     Status: None   Collection Time: 12/03/20  2:42 AM  Result Value Ref Range   Magnesium 2.2 1.7 - 2.4 mg/dL    Comment: Performed at [facility], [place] [address], [place], [place] [address]  Comprehensive metabolic panel     Status: Abnormal   Collection Time: 12/03/20  3:29 AM  Result Value Ref Range   Sodium 139 135 - 145 mmol/L   Potassium 4.2 3.5 - 5.1 mmol/L   Chloride 107 98 - 111 mmol/L   CO2 25 22 - 32 mmol/L  Glucose, Bld 135 (H) 70 - 99 mg/dL    Comment: Glucose reference range applies only to samples taken after fasting for at least 8 hours.   BUN 20 8 - 23 mg/dL   Creatinine, Ser 1.17 (H) 0.44 - 1.00 mg/dL   Calcium 8.3 (L) 8.9 - 10.3 mg/dL   Total Protein 5.9 (L) 6.5 - 8.1 g/dL   Albumin 3.0 (L) 3.5 - 5.0 g/dL   AST 30 15 - 41 U/L   ALT 31 0 - 44 U/L   Alkaline Phosphatase 45 38 - 126 U/L   Total Bilirubin 0.4 0.3 - 1.2 mg/dL   GFR,  Estimated 50 (L) >60 mL/min    Comment: (NOTE) Calculated using the CKD-EPI Creatinine Equation (2021)    Anion gap 7 5 - 15    Comment: Performed at [facility], [place] [address], [place], [place] [address]  CBC with Differential/Platelet     Status: Abnormal   Collection Time: 12/03/20  3:29 AM  Result Value Ref Range   WBC 10.7 (H) 4.0 - 10.5 K/uL   RBC 3.43 (L) 3.87 - 5.11 MIL/uL   Hemoglobin 10.4 (L) 12.0 - 15.0 g/dL   HCT 32.2 (L) 36.0 - 46.0 %   MCV 93.9 80.0 - 100.0 fL   MCH 30.3 26.0 - 34.0 pg   MCHC 32.3 30.0 - 36.0 g/dL   RDW 14.1 11.5 - 15.5 %   Platelets 229 150 - 400 K/uL   nRBC 0.0 0.0 - 0.2 %   Neutrophils Relative % 83 %   Neutro Abs 8.9 (H) 1.7 - 7.7 K/uL   Lymphocytes Relative 10 %   Lymphs Abs 1.0 0.7 - 4.0 K/uL   Monocytes Relative 7 %   Monocytes Absolute 0.7 0.1 - 1.0 K/uL   Eosinophils Relative 0 %   Eosinophils Absolute 0.0 0.0 - 0.5 K/uL   Basophils Relative 0 %   Basophils Absolute 0.0 0.0 - 0.1 K/uL   Immature Granulocytes 0 %   Abs Immature Granulocytes 0.03 0.00 - 0.07 K/uL    Comment: Performed at [facility], [place] [address], [place], [place] [address]  Hemoglobin A1c     Status: Abnormal   Collection Time: 12/03/20  3:30 AM  Result Value Ref Range   Hgb A1c MFr Bld 6.2 (H) 4.8 - 5.6 %    Comment: (NOTE) Pre diabetes:          5.7%-6.4%  Diabetes:              >6.4%  Glycemic control for   <7.0% adults with diabetes    Mean Plasma Glucose 131.24 mg/dL    Comment: Performed at [facility] [address] [place],  [address]  Lipid panel     Status: Abnormal   Collection Time: 12/03/20  3:30 AM  Result Value Ref Range   Cholesterol 142 0 - 200 mg/dL   Triglycerides 107 <150 mg/dL   HDL 40 (L) >40 mg/dL   Total CHOL/HDL Ratio 3.6 RATIO   VLDL 21 0 - 40 mg/dL   LDL Cholesterol 81 0 - 99 mg/dL    Comment:        Total Cholesterol/HDL:CHD Risk Coronary Heart Disease Risk Table                     Men    Women  1/2 Average Risk   3.4   3.3  Average Risk  5.0   4.4  2 X Average Risk   9.6   7.1  3 X Average Risk  23.4   11.0        Use the calculated Patient Ratio above and the CHD Risk Table to determine the patient's CHD Risk.        ATP III CLASSIFICATION (LDL):  <100     mg/dL   Optimal  100-129  mg/dL   Near or Above                    Optimal  130-159  mg/dL   Borderline  160-189  mg/dL   High  >190     mg/dL   Very High Performed at [facility] [address], [place], [place] [address]   Triglycerides     Status: None   Collection Time: 12/03/20  3:30 AM  Result Value Ref Range   Triglycerides 100 <150 mg/dL    Comment: Performed at [facility] [address], [place], [place] [address]  Glucose, capillary     Status: Abnormal   Collection Time: 12/03/20  7:37 AM  Result Value Ref Range   Glucose-Capillary 128 (H) 70 - 99 mg/dL    Comment: Glucose reference range applies only to samples taken after fasting for at least 8 hours.  Glucose, capillary     Status: Abnormal   Collection Time: 12/03/20 11:35 AM  Result Value Ref Range   Glucose-Capillary 186 (H) 70 - 99 mg/dL    Comment: Glucose reference range applies only to samples taken after fasting for at least 8 hours.  Glucose, capillary     Status: Abnormal   Collection Time: 12/03/20  4:47 PM  Result Value Ref Range   Glucose-Capillary 163 (H) 70 - 99 mg/dL    Comment: Glucose reference range applies only to samples taken after fasting for at least 8 hours.  Glucose, capillary     Status: Abnormal   Collection Time: 12/03/20  8:47 PM  Result Value Ref Range   Glucose-Capillary 145 (H) 70 - 99 mg/dL    Comment: Glucose reference range applies only to samples taken after fasting for at least 8 hours.   Comment 1 Notify RN    Comment 2 Document in Chart   Glucose, capillary     Status: Abnormal   Collection Time: 12/03/20 11:29 PM  Result Value Ref Range   Glucose-Capillary 199 (H) 70 - 99  mg/dL    Comment: Glucose reference range applies only to samples taken after fasting for at least 8 hours.   Comment 1 Notify RN    Comment 2 Document in Chart   Heparin level (unfractionated)     Status: Abnormal   Collection Time: 12/04/20  2:24 AM  Result Value Ref Range   Heparin Unfractionated 0.17 (L) 0.30 - 0.70 IU/mL    Comment: (NOTE) The clinical reportable range upper limit is being lowered to >1.10 to align with the FDA approved guidance for the current laboratory assay.  If heparin results are below expected values, and patient dosage has  been confirmed, suggest follow up testing of antithrombin III levels. Performed at [facility], [place] [address], [place], [place] [address]   APTT     Status: None   Collection Time: 12/04/20  2:24 AM  Result Value Ref Range   aPTT 28 24 - 36 seconds    Comment: Performed at [facility]  [address], [place], [place] [address]  Glucose, capillary     Status: Abnormal   Collection Time: 12/04/20  4:06 AM  Result Value Ref Range   Glucose-Capillary 144 (H) 70 - 99 mg/dL    Comment: Glucose reference range applies only to samples taken after fasting for at least 8 hours.   Comment 1 Notify RN    Comment 2 Document in Chart   Glucose, capillary     Status: Abnormal   Collection Time: 12/04/20  7:28 AM  Result Value Ref Range   Glucose-Capillary 174 (H) 70 - 99 mg/dL    Comment: Glucose reference range applies only to samples taken after fasting for at least 8 hours.   Comment 1 Notify RN    Comment 2 Document in Chart   Heparin level (unfractionated)     Status: Abnormal   Collection Time: 12/04/20  9:30 AM  Result Value Ref Range   Heparin Unfractionated 0.22 (L) 0.30 - 0.70 IU/mL    Comment: (NOTE) The clinical reportable range upper limit is being lowered to >1.10 to align with the FDA approved guidance for the current laboratory assay.  If heparin results are below expected values, and patient dosage has   been confirmed, suggest follow up testing of antithrombin III levels. Performed at [facility], [place] [address] [place], [place] [address]   APTT     Status: None   Collection Time: 12/04/20  9:30 AM  Result Value Ref Range   aPTT 34 24 - 36 seconds    Comment: Performed at [facility] [address] [place], [place] [id number]  Basic metabolic panel     Status: Abnormal   Collection Time: 12/04/20  9:30 AM  Result Value Ref Range   Sodium 141 135 - 145 mmol/L   Potassium 4.2 3.5 - 5.1 mmol/L   Chloride 110 98 - 111 mmol/L   CO2 25 22 - 32 mmol/L   Glucose, Bld 156 (H) 70 - 99 mg/dL    Comment: Glucose reference range applies only to samples taken after fasting for at least 8 hours.   BUN 24 (H) 8 - 23 mg/dL   Creatinine, Ser 0.92 0.44 - 1.00 mg/dL   Calcium 8.3 (L) 8.9 - 10.3 mg/dL   GFR, Estimated >60 >60 mL/min    Comment: (NOTE) Calculated using the CKD-EPI Creatinine Equation (2021)    Anion gap 6 5 - 15    Comment: Performed at [facility] [address] [place], [place] [address]  Glucose, capillary     Status: Abnormal   Collection Time: 12/04/20 11:16 AM  Result Value Ref Range   Glucose-Capillary 173 (H) 70 - 99 mg/dL    Comment: Glucose reference range applies only to samples taken after fasting for at least 8 hours.   Comment 1 Notify RN    Comment 2 Document in Chart    MR BRAIN WO CONTRAST  Result Date: 12/03/2020 CLINICAL DATA:  Stroke follow-up EXAM: MRI HEAD WITHOUT CONTRAST TECHNIQUE: Multiplanar, multiecho pulse sequences of the brain and surrounding structures were obtained without intravenous contrast. COMPARISON:  CT angio head neck 12/02/2020 FINDINGS: Brain: Restricted diffusion compatible with acute infarct in the left frontal parietal cortex. This appears to be the postcentral gyrus. Mild-to-moderate area of cortical infarction. No associated hemorrhage. No other areas of acute infarct. Mild white matter changes with patchy  white matter hyperintensities bilaterally. Small hyperintensity central pons. No hydrocephalus or mass. Vascular: Normal arterial  flow voids. Skull and upper cervical spine: No focal skeletal lesion Sinuses/Orbits: Mild mucosal edema paranasal sinuses. Negative orbit Other: None IMPRESSION: Small to moderate acute cortical infarct in the left frontal parietal cortex primarily in the postcentral gyrus. No associated hemorrhage. Electronically Signed   By: Franchot Gallo M.D.   On: 12/03/2020 16:05   IR CT Head Ltd  Result Date: 12/04/2020 INDICATION: New onset right-sided weakness with global aphasia. Occluded superior division of the left middle cerebral artery on CT angiogram of the head and neck. EXAM: 1. EMERGENT LARGE VESSEL OCCLUSION THROMBOLYSIS (anterior CIRCULATION) COMPARISON:  CT angiogram of the head and neck of December 02, 2020. MEDICATIONS: Ancef 2 g IV antibiotic was administered within 1 hour of the procedure. ANESTHESIA/SEDATION: General anesthesia. CONTRAST:  Omnipaque 300 approximately 70 mL. FLUOROSCOPY TIME:  Fluoroscopy Time: 53 minutes 18 seconds (1538 mGy). COMPLICATIONS: None immediate. TECHNIQUE: Following a full explanation of the procedure along with the potential associated complications, an informed witnessed consent was obtained. The risks of intracranial hemorrhage of 10%, worsening neurological deficit, ventilator dependency, death and inability to revascularize were all reviewed in detail with the patient's spouse. The patient was then put under general anesthesia by the Department of Anesthesiology at [facility]. The right groin was prepped and draped in the usual sterile fashion. Thereafter using modified Seldinger technique, transfemoral access into the right common femoral artery was obtained without difficulty. Over a 0.035 inch guidewire an8 [place] Pinnacle 25 cm sheath was inserted. Through this, and also over a 0.035 inch guidewire a 5 [place] JB 1 catheter was  advanced to the aortic arch region and selectively positioned left common carotid artery. FINDINGS: The left common carotid arteriogram demonstrates the left external carotid artery and its major branches to be widely patent. The left internal carotid artery at the bulb to the cranial skull base is widely patent. There is a small focal ulceration seen of the left internal carotid artery just distal to the bulb associated with mild narrowing. More distally the left internal carotid artery is seen to opacify to the cranial skull base. The petrous, the cavernous and the supraclinoid segments are widely patent. The left anterior cerebral artery opacifies into the capillary and venous phases. The left middle cerebral artery M1 segment is widely patent. Patency is seen of the left middle cerebral artery inferior division, and the anterior temporal branch. A stump of an occluded superior division is noted proximally. PROCEDURE: Over a 0.035 inch 300 cm Rosen exchange guidewire, an 087 balloon guide catheter which had been prepped with 50% contrast and 50% heparinized saline infusion was advanced and positioned just proximal to the left common carotid bifurcation. The guidewire was removed. Good aspiration obtained from the hub of the balloon guide catheter. A control arteriogram performed through this demonstrated no evidence of focal vasospasm, or of dissection or of filling defects. Over a 0.014 inch standard Synchro micro guidewire with a J-tip configuration, a combination of an 021 160 cm Trevo Trak microcatheter inside of an 071 Zoom 132 cm aspiration catheter was advanced to the supraclinoid left ICA. The micro guidewire was then gently advanced through the occluded superior division into the M2 M3 region followed by the microcatheter. The Zoom aspiration catheter was now advanced into the proximal left M1 segment. The guidewire was removed. Good aspiration obtained from the hub of the microcatheter. A gentle control  arteriogram performed through the microcatheter demonstrated safe position of the tip of the microcatheter with antegrade flow. This was  then connected to continuous heparinized saline infusion. A 4 mm x 40 mm Solitaire X retrieval device was then advanced to the distal end of the microcatheter. With slight forward gentle traction with the right hand on the delivery micro guidewire, with the left hand the delivery microcatheter was retrieved unsheathing the retrieval device the proximal end of which was just proximal to the occluded superior division. The Zoom aspiration catheter was now advanced into the orifice of the superior division. Thereafter, with proximal flow arrest in the left internal carotid artery, and constant aspiration applied with a 20 mL syringe at the hub of the balloon guide catheter, hub of the Zoom aspiration catheter for approximately 2-1/2 minutes, the combination of the retrieval device, the microcatheter, and the Zoom aspiration catheter were retrieved and removed. A few specks were seen in the struts of the retrieval device. A control arteriogram performed through the left internal carotid artery following flow reversal of flow arrest demonstrated improved flow through the superior division. A TICI 2C revascularization was achieved. However, there continued be an appreciable area of hypoperfusion involving the superior division. Over a 0.014 inch standard Synchro micro guidewire with a J configuration, a 132 cm 6 [place] Catalyst guide catheter inside of which was an 021 160 cm Trevo Trak microcatheter was advanced again into the supraclinoid left ICA. The micro guidewire was then gently advanced using a torque device through the superior division into the more anterior branch followed by the microcatheter. The guidewire was removed. Good aspiration obtained from the hub of the microcatheter which was now in the distal M2 segment. A gentle arteriogram performed through the microcatheter  demonstrates safe position of the tip of the microcatheter with good antegrade flow. This was then connected to continuous heparinized saline infusion. A 3 mm x 32 mm Trevo ProVue retrieval device was then advanced to the distal end of the microcatheter. The retrieval device was deployed as mentioned earlier. The Catalyst guide catheter was advanced out of the orifice of the superior division. With proximal flow arrest in the left internal carotid artery, and constant aspiration applied at the hub of the balloon guide catheter, and at the hub of the 6 [place] Catalyst guide catheter with an aspiration pump for 2-1/2 minutes, the combination of the retrieval device, the microcatheter, and the aspiration catheter were retrieved and removed. Following reversal of flow arrest, control arteriogram performed through the balloon guide catheter in the left internal carotid artery demonstrated near complete revascularization of the left middle cerebral artery distribution achieving a TICI 2C plus revascularization. The left anterior cerebral artery remained widely patent. The balloon guide was then retrieved more proximally into the left common carotid artery. A control arteriogram performed at this time demonstrated wide patency of the left internal carotid artery extra cranially and intracranially. The previously noted ulceration at the proximal aspect of the left internal carotid remains stable. Patency was seen of the left middle cerebral artery with unchanged rapid perfusion of TICI 2C plus. The balloon guide was removed. The 8 French Pinnacle sheath was removed with successful hemostasis at the right groin with a 6 French Angio-Seal closure device. Distal pulses remained present in both feet unchanged. CT of the brain performed demonstrated no evidence of intracranial hemorrhage or mass effect. The patient was left intubated due to pre procedural global aphasia and difficulty with communication. Manual pressure was held  at the right groin puncture site for hemostasis due to failure of the Angio-Seal closure device. Distal pulses remained present  in both feet unchanged. IMPRESSION: Status post endovascular revascularization of occluded superior division of the left middle cerebral artery with 1 pass with the 4 mm x 40 mm Solitaire X retrieval device and contact aspiration, and 1 pass with a 3 mm x 32 mm Trevo ProVue retriever and contact aspiration achieving a TICI 2C plus revascularization. PLAN: As per referring MD. Electronically Signed   By: Luanne Bras M.D.   On: 12/03/2020 17:46   DG CHEST PORT 1 VIEW  Result Date: 12/03/2020 CLINICAL DATA:  ETT placement EXAM: PORTABLE CHEST 1 VIEW COMPARISON:  12/02/2020 FINDINGS: Endotracheal tube with the tip 4.5 cm above the carina. Bilateral mild interstitial thickening. No pleural effusion or pneumothorax. Stable cardiomegaly. No acute osseous abnormality. IMPRESSION: Cardiomegaly with pulmonary vascular congestion. Endotracheal tube with the tip 4.5 cm above the carina. Electronically Signed   By: Kathreen Devoid   On: 12/03/2020 11:43   Portable Chest x-ray  Result Date: 12/02/2020 CLINICAL DATA:  Endotracheal tube position EXAM: PORTABLE CHEST 1 VIEW COMPARISON:  11/30/2020 FINDINGS: Endotracheal tube tip is at the level of the clavicular heads. There is moderate cardiomegaly. This is likely exaggerated by supine positioning. Mild left-greater-than-right interstitial opacities, slightly worsened. IMPRESSION: Endotracheal tube tip at the level of the clavicular heads. Worsening of left-greater-than-right interstitial opacities favored to indicate pulmonary edema, though pneumonia remains difficult to exclude. Electronically Signed   By: Ulyses Jarred M.D.   On: 12/02/2020 19:27   DG Abd Portable 1V  Result Date: 12/03/2020 CLINICAL DATA:  NG tube placement EXAM: PORTABLE ABDOMEN - 1 VIEW COMPARISON:  None. FINDINGS: Nasogastric tube with the tip projecting over the  antrum of the stomach. No bowel dilatation to suggest obstruction. No evidence of pneumoperitoneum, portal venous gas or pneumatosis. No pathologic calcifications along the expected course of the ureters. No acute osseous abnormality. IMPRESSION: Nasogastric tube with the tip projecting over the antrum of the stomach. Electronically Signed   By: Kathreen Devoid   On: 12/03/2020 11:35   ECHOCARDIOGRAM COMPLETE  Result Date: 12/03/2020    ECHOCARDIOGRAM REPORT   Patient Name:   Candice Perez Date of Exam: 12/03/2020 Medical Rec #:  [id number]        Height:       67.0 in Accession #:    [id number]       Weight:       295.0 lb Date of Birth:  1951-03-13        BSA:          2.385 m Patient Age:    17 years         BP:           96/70 mmHg Patient Gender: F                HR:           101 bpm. Exam Location:  Inpatient Procedure: 2D Echo, Cardiac Doppler, Color Doppler and Intracardiac            Opacification Agent Indications:    Stroke  History:        Patient has prior history of Echocardiogram examinations, most                 recent 06/20/2017. Risk Factors:Hypertension, Dyslipidemia and                 Former Smoker.  Sonographer:    Vickie Epley RDCS Referring Phys: [id number] [place]  Sonographer Comments: Echo  performed with patient supine and on artificial respirator. IMPRESSIONS  1. Technically difficult; definity used; LV function difficult to quantitate due to poor images and tachycardia (pt in atrial fibrillation with HR 120-160); suggest FU imaging when HR better controlled.  2. Left ventricular ejection fraction, by estimation, is 45 to 50%. The left ventricle has mildly decreased function. The left ventricle demonstrates global hypokinesis. Left ventricular diastolic function could not be evaluated.  3. Right ventricular systolic function is normal. The right ventricular size is normal.  4. The mitral valve is normal in structure. No evidence of mitral valve regurgitation. No evidence of mitral  stenosis.  5. The aortic valve is tricuspid. Aortic valve regurgitation is not visualized. Mild aortic valve sclerosis is present, with no evidence of aortic valve stenosis.  6. The inferior vena cava is normal in size with greater than 50% respiratory variability, suggesting right atrial pressure of 3 mmHg. FINDINGS  Left Ventricle: Left ventricular ejection fraction, by estimation, is 45 to 50%. The left ventricle has mildly decreased function. The left ventricle demonstrates global hypokinesis. Definity contrast agent was given IV to delineate the left ventricular  endocardial borders. The left ventricular internal cavity size was normal in size. There is no left ventricular hypertrophy. Left ventricular diastolic function could not be evaluated due to atrial fibrillation. Left ventricular diastolic function could  not be evaluated. Right Ventricle: The right ventricular size is normal. Right ventricular systolic function is normal. Left Atrium: Left atrial size was normal in size. Right Atrium: Right atrial size was normal in size. Pericardium: There is no evidence of pericardial effusion. Mitral Valve: The mitral valve is normal in structure. Mild mitral annular calcification. No evidence of mitral valve regurgitation. No evidence of mitral valve stenosis. Tricuspid Valve: The tricuspid valve is normal in structure. Tricuspid valve regurgitation is trivial. No evidence of tricuspid stenosis. Aortic Valve: The aortic valve is tricuspid. Aortic valve regurgitation is not visualized. Mild aortic valve sclerosis is present, with no evidence of aortic valve stenosis. Pulmonic Valve: The pulmonic valve was normal in structure. Pulmonic valve regurgitation is trivial. No evidence of pulmonic stenosis. Aorta: The aortic root is normal in size and structure. Venous: The inferior vena cava is normal in size with greater than 50% respiratory variability, suggesting right atrial pressure of 3 mmHg. IAS/Shunts: The  interatrial septum was not well visualized. Additional Comments: Technically difficult; definity used; LV function difficult to quantitate due to poor images and tachycardia (pt in atrial fibrillation with HR 120-160); suggest FU imaging when HR better controlled.  LEFT VENTRICLE PLAX 2D LVOT diam:     2.20 cm LV SV:         55 LV SV Index:   23 LVOT Area:     3.80 cm  RIGHT VENTRICLE TAPSE (M-mode): 1.5 cm LEFT ATRIUM             Index       RIGHT ATRIUM           Index LA Vol (A2C):   63.7 ml 26.70 ml/m RA Area:     21.70 cm LA Vol (A4C):   72.9 ml 30.56 ml/m RA Volume:   65.60 ml  27.50 ml/m LA Biplane Vol: 68.6 ml 28.76 ml/m  AORTIC VALVE LVOT Vmax:   92.87 cm/s LVOT Vmean:  67.600 cm/s LVOT VTI:    0.145 m  AORTA Ao Root diam: 3.50 cm Ao Asc diam:  3.70 cm  SHUNTS Systemic VTI:  0.15 m Systemic Diam:  2.20 cm Kirk Ruths MD Electronically signed by Kirk Ruths MD Signature Date/Time: 12/03/2020/12:52:35 PM    Final    IR PERCUTANEOUS ART THROMBECTOMY/INFUSION INTRACRANIAL INC DIAG ANGIO  Result Date: 12/04/2020 INDICATION: New onset right-sided weakness with global aphasia. Occluded superior division of the left middle cerebral artery on CT angiogram of the head and neck. EXAM: 1. EMERGENT LARGE VESSEL OCCLUSION THROMBOLYSIS (anterior CIRCULATION) COMPARISON:  CT angiogram of the head and neck of December 02, 2020. MEDICATIONS: Ancef 2 g IV antibiotic was administered within 1 hour of the procedure. ANESTHESIA/SEDATION: General anesthesia. CONTRAST:  Omnipaque 300 approximately 70 mL. FLUOROSCOPY TIME:  Fluoroscopy Time: 53 minutes 18 seconds (1538 mGy). COMPLICATIONS: None immediate. TECHNIQUE: Following a full explanation of the procedure along with the potential associated complications, an informed witnessed consent was obtained. The risks of intracranial hemorrhage of 10%, worsening neurological deficit, ventilator dependency, death and inability to revascularize were all reviewed in detail with  the patient's spouse. The patient was then put under general anesthesia by the Department of Anesthesiology at [facility]. The right groin was prepped and draped in the usual sterile fashion. Thereafter using modified Seldinger technique, transfemoral access into the right common femoral artery was obtained without difficulty. Over a 0.035 inch guidewire an8 [place] Pinnacle 25 cm sheath was inserted. Through this, and also over a 0.035 inch guidewire a 5 [place] JB 1 catheter was advanced to the aortic arch region and selectively positioned left common carotid artery. FINDINGS: The left common carotid arteriogram demonstrates the left external carotid artery and its major branches to be widely patent. The left internal carotid artery at the bulb to the cranial skull base is widely patent. There is a small focal ulceration seen of the left internal carotid artery just distal to the bulb associated with mild narrowing. More distally the left internal carotid artery is seen to opacify to the cranial skull base. The petrous, the cavernous and the supraclinoid segments are widely patent. The left anterior cerebral artery opacifies into the capillary and venous phases. The left middle cerebral artery M1 segment is widely patent. Patency is seen of the left middle cerebral artery inferior division, and the anterior temporal branch. A stump of an occluded superior division is noted proximally. PROCEDURE: Over a 0.035 inch 300 cm Rosen exchange guidewire, an 087 balloon guide catheter which had been prepped with 50% contrast and 50% heparinized saline infusion was advanced and positioned just proximal to the left common carotid bifurcation. The guidewire was removed. Good aspiration obtained from the hub of the balloon guide catheter. A control arteriogram performed through this demonstrated no evidence of focal vasospasm, or of dissection or of filling defects. Over a 0.014 inch standard Synchro micro guidewire with  a J-tip configuration, a combination of an 021 160 cm Trevo Trak microcatheter inside of an 071 Zoom 132 cm aspiration catheter was advanced to the supraclinoid left ICA. The micro guidewire was then gently advanced through the occluded superior division into the M2 M3 region followed by the microcatheter. The Zoom aspiration catheter was now advanced into the proximal left M1 segment. The guidewire was removed. Good aspiration obtained from the hub of the microcatheter. A gentle control arteriogram performed through the microcatheter demonstrated safe position of the tip of the microcatheter with antegrade flow. This was then connected to continuous heparinized saline infusion. A 4 mm x 40 mm Solitaire X retrieval device was then advanced to the distal end of the microcatheter. With slight forward gentle traction  with the right hand on the delivery micro guidewire, with the left hand the delivery microcatheter was retrieved unsheathing the retrieval device the proximal end of which was just proximal to the occluded superior division. The Zoom aspiration catheter was now advanced into the orifice of the superior division. Thereafter, with proximal flow arrest in the left internal carotid artery, and constant aspiration applied with a 20 mL syringe at the hub of the balloon guide catheter, hub of the Zoom aspiration catheter for approximately 2-1/2 minutes, the combination of the retrieval device, the microcatheter, and the Zoom aspiration catheter were retrieved and removed. A few specks were seen in the struts of the retrieval device. A control arteriogram performed through the left internal carotid artery following flow reversal of flow arrest demonstrated improved flow through the superior division. A TICI 2C revascularization was achieved. However, there continued be an appreciable area of hypoperfusion involving the superior division. Over a 0.014 inch standard Synchro micro guidewire with a J configuration, a  132 cm 6 [place] Catalyst guide catheter inside of which was an 021 160 cm Trevo Trak microcatheter was advanced again into the supraclinoid left ICA. The micro guidewire was then gently advanced using a torque device through the superior division into the more anterior branch followed by the microcatheter. The guidewire was removed. Good aspiration obtained from the hub of the microcatheter which was now in the distal M2 segment. A gentle arteriogram performed through the microcatheter demonstrates safe position of the tip of the microcatheter with good antegrade flow. This was then connected to continuous heparinized saline infusion. A 3 mm x 32 mm Trevo ProVue retrieval device was then advanced to the distal end of the microcatheter. The retrieval device was deployed as mentioned earlier. The Catalyst guide catheter was advanced out of the orifice of the superior division. With proximal flow arrest in the left internal carotid artery, and constant aspiration applied at the hub of the balloon guide catheter, and at the hub of the 6 [place] Catalyst guide catheter with an aspiration pump for 2-1/2 minutes, the combination of the retrieval device, the microcatheter, and the aspiration catheter were retrieved and removed. Following reversal of flow arrest, control arteriogram performed through the balloon guide catheter in the left internal carotid artery demonstrated near complete revascularization of the left middle cerebral artery distribution achieving a TICI 2C plus revascularization. The left anterior cerebral artery remained widely patent. The balloon guide was then retrieved more proximally into the left common carotid artery. A control arteriogram performed at this time demonstrated wide patency of the left internal carotid artery extra cranially and intracranially. The previously noted ulceration at the proximal aspect of the left internal carotid remains stable. Patency was seen of the left middle cerebral  artery with unchanged rapid perfusion of TICI 2C plus. The balloon guide was removed. The 8 French Pinnacle sheath was removed with successful hemostasis at the right groin with a 6 French Angio-Seal closure device. Distal pulses remained present in both feet unchanged. CT of the brain performed demonstrated no evidence of intracranial hemorrhage or mass effect. The patient was left intubated due to pre procedural global aphasia and difficulty with communication. Manual pressure was held at the right groin puncture site for hemostasis due to failure of the Angio-Seal closure device. Distal pulses remained present in both feet unchanged. IMPRESSION: Status post endovascular revascularization of occluded superior division of the left middle cerebral artery with 1 pass with the 4 mm x 40 mm Solitaire X retrieval  device and contact aspiration, and 1 pass with a 3 mm x 32 mm Trevo ProVue retriever and contact aspiration achieving a TICI 2C plus revascularization. PLAN: As per referring MD. Electronically Signed   By: Luanne Bras M.D.   On: 12/03/2020 17:46    Review Of Systems AS per HPI.   Blood pressure 116/78, pulse (!) 120, temperature 98.9 F (37.2 C), temperature source Axillary, resp. rate 16, height '5\' 7"'$  (1.702 m), weight 134 kg, SpO2 98 %. Body mass index is 46.27 kg/m. General appearance: alert, cooperative, appears stated age and moderate respiratory distress Head: Normocephalic, atraumatic. Eyes: Hazel eyes, pink conjunctiva, corneas clear. PERRL, EOM's intact. Neck: No adenopathy, no carotid bruit, no JVD, supple, symmetrical, trachea midline and thyroid not enlarged. Resp: Clearing to auscultation bilaterally. Cardio: Tachycardic, Regular rate and rhythm, S1, S2 normal, II/VI systolic murmur, no click, rub or gallop GI: Soft, non-tender; bowel sounds normal; no organomegaly. Extremities: 1 + edema, no cyanosis or clubbing. Skin: Warm and dry.  Neurologic: Alert and oriented X 3,  normal strength.   Assessment/Plan Atrial fibrillation with RVR, CHA2DS2VASc score of 5 Acute stroke Acute respiratory failure with hypoxia Hypothyroidism Obesity GERD Type 2 DM  Plan: Add IV diltiazem drip Add small dose IV lanoxin for heart rate control. Continue anticoagulation.  Time spent: Review of old records, Lab, x-rays, EKG, other cardiac tests, examination, discussion with patient over 70 minutes.  Birdie Riddle, MD  12/04/2020, 12:04 PM

## 2020-12-04 NOTE — Progress Notes (Addendum)
STROKE TEAM PROGRESS NOTE   INTERVAL HISTORY Son and RN are at the bedside. Pt lying in bed, still intubated but awake alert and following most simple commands. Still has right UE weakness but BLEs moving well. No on weaning of ventilator and doing well. Plan to extubate today. However, pt still has RVR and HR 120-140s. On amiodarone drip and metoprolol. On heparin IV, will call cardiology consult.    Vitals:   12/04/20 0400 12/04/20 0404 12/04/20 0415 12/04/20 0500  BP: 103/72     Pulse: (!) 134  (!) 136   Resp: 19  18   Temp:  97.7 F (36.5 C)    TempSrc:  Axillary    SpO2: 95%  95%   Weight:    134 kg  Height:       CBC:  Recent Labs  Lab 12/02/20 1934 12/03/20 0329  WBC  --  10.7*  NEUTROABS  --  8.9*  HGB 10.9* 10.4*  HCT 32.0* 32.2*  MCV  --  93.9  PLT  --  [id number]   Basic Metabolic Panel:  Recent Labs  Lab 12/02/20 1934 12/03/20 0242 12/03/20 0329  NA 140  --  139  K 4.5  --  4.2  CL  --   --  107  CO2  --   --  25  GLUCOSE  --   --  135*  BUN  --   --  20  CREATININE  --   --  1.17*  CALCIUM  --   --  8.3*  MG  --  2.2  --    Lipid Panel:  Recent Labs  Lab 12/03/20 0330  CHOL 142  TRIG 107  100  HDL 40*  CHOLHDL 3.6  VLDL 21  LDLCALC 81   HgbA1c:  Recent Labs  Lab 12/03/20 0330  HGBA1C 6.2*   Urine Drug Screen:  Recent Labs  Lab 12/02/20 1840  LABOPIA NONE DETECTED  COCAINSCRNUR NONE DETECTED  LABBENZ NONE DETECTED  AMPHETMU NONE DETECTED  THCU NONE DETECTED  LABBARB NONE DETECTED      PHYSICAL EXAM  Temp:  [97.7 F (36.5 C)-99.6 F (37.6 C)] 98.9 F (37.2 C) (07/23 0800) Pulse Rate:  [112-142] 120 (07/23 0900) Resp:  [14-26] 16 (07/23 0900) BP: (90-132)/(59-114) 116/78 (07/23 0900) SpO2:  [89 %-99 %] 93 % (07/23 0900) Arterial Line BP: (86-166)/(61-136) 155/84 (07/23 0900) FiO2 (%):  [50 %-60 %] 50 % (07/23 0737) Weight:  [134 kg] 134 kg (07/23 0500)  General - Well nourished, well developed, intubated off on  sedation.  Ophthalmologic - fundi not visualized due to noncooperation.  Cardiovascular - irregularly irregular heart rate and rhythm.  Neuro - intubated off sedation, eyes open, following most simple commands, however did not follow some finger movement on the left hand. Tracking bilaterally, not consistently blinking to visual threat on the right but visual field testing by confrontation seems intact. PERRL. Facial symmetry not able to test due to ET tube.  Tongue protrusion midline. LUE no drift. RUE 3/5, BLEs 3+/5 proximal and 4/5 distally. Sensation symmetrical subjectively, Left FTN slow but intact, and gait not tested.  MR Brain WO Contrast 12/02/20 IMPRESSION: Small to moderate acute cortical infarct in the left frontal parietal cortex primarily in the postcentral gyrus. No associated hemorrhage.   ASSESSMENT/PLAN Candice Perez is a 70 y.o. female with history of hypertension, hyperlipidemia, and prior TIA who presented as a code stroke from [facility] after being found with  right sided weakness and aphasia earlier today while admitted for afib RVR. CTA revealed a left MCA LVO. tPA was not given as she was on eliquis. She was take for thrombectomy.  Stroke - left MCA infarct due to left MCA occlusion s/p thrombectomy with  TICI2c, etiology likely due to A. fib CT no acute finding CT head and neck left MCA occlusion S/p IR with TICI2c revascularization MRI 12/02/20 - Small to moderate acute cortical infarct in the left frontal parietal cortex primarily in the postcentral gyrus. No associated hemorrhage. Echocardiogram EF 45 to 50% LDL 81 HgbA1c 6.2 DVT prophylaxis Heparin IV Plavix PTA, currently on heparin IV for anticoagulation Therapy recommendations - CIR Disposition: Pending  Afib RVR Continue amiodarone IV Still has RVR, cardiology consulted - (Appreciate his assistance) Put on diltiazem drip and digoxin 0.25 mg daily for 2 days Increase metoprolol to 25 mg  3 times daily Continue Heparin IV   Acute hypoxic respiratory failure requiring mechanical ventilation Remains on the vent this morning. On weaning trial, doing well CCM on board Plan to extubate today  History of TIA 05/2017 right arm numbness and weakness lasting for minutes.  Follows with Dr. Evelena Leyden at [facility].  In 07/2017 EF 55 to 60%, carotid Doppler showed left ICA 40 to 59% stenosis.  MRI negative for acute stroke.  Hyperlipidemia No statin at home LDL 81, goal < 70 Start crestor '20mg'$  daily Continue statin on discharge  HTN BP goal < 180/105 now BP on the low end On metoprolol and IV diltizam.  BP monitoring  Dysphagia On TF '@45'$  Speech on board Needed swallow evaluation after extubation  Other stroke risk factors Morbid obesity, BMI 46.27  Other acute issues Anemia of chronic disease, Hb 10.4 ->11.0 Leukocytosis WBC 10.7 ->11.5 AKI creatinine 1.17-> 0.92  Hospital day # 2  This patient is critically ill due to left MCA stroke, A. fib RVR, respiratory failure, status post thrombectomy and at significant risk of neurological worsening, death form recurrent stroke, heart failure, cerebral edema, seizure, aspiration pneumonia. This patient's care requires constant monitoring of vital signs, hemodynamics, respiratory and cardiac monitoring, review of multiple databases, neurological assessment, discussion with family, other specialists and medical decision making of high complexity. I spent 40 minutes of neurocritical care time in the care of this patient. I had long discussion with son at bedside, updated pt current condition, treatment plan and potential prognosis, and answered all the questions.  He expressed understanding and appreciation.   Rosalin Hawking, MD PhD Stroke Neurology 12/04/2020 4:34 PM    To contact Stroke Continuity provider, please refer to [email or web address] After hours, contact General Neurology

## 2020-12-04 NOTE — Progress Notes (Signed)
OT Cancellation Note  Patient Details Name: Candice Perez MRN: [id number] DOB: 05-30-1950   Cancelled Treatment:    Reason Eval/Treat Not Completed: Medical issues which prohibited therapy (currently under going extubation with staff so will check back as appropriate)  Billey Chang, OTR/L  Acute Rehabilitation Services Pager: [phone] Office: [phone] .  12/04/2020, 11:04 AM

## 2020-12-05 DIAGNOSIS — I4891 Unspecified atrial fibrillation: Secondary | ICD-10-CM | POA: Diagnosis not present

## 2020-12-05 DIAGNOSIS — I639 Cerebral infarction, unspecified: Secondary | ICD-10-CM | POA: Diagnosis not present

## 2020-12-05 DIAGNOSIS — I6602 Occlusion and stenosis of left middle cerebral artery: Secondary | ICD-10-CM | POA: Diagnosis not present

## 2020-12-05 DIAGNOSIS — I63412 Cerebral infarction due to embolism of left middle cerebral artery: Secondary | ICD-10-CM | POA: Diagnosis not present

## 2020-12-05 LAB — CBC
HCT: 33.1 % — ABNORMAL LOW (ref 36.0–46.0)
Hemoglobin: 10.6 g/dL — ABNORMAL LOW (ref 12.0–15.0)
MCH: 30 pg (ref 26.0–34.0)
MCHC: 32 g/dL (ref 30.0–36.0)
MCV: 93.8 fL (ref 80.0–100.0)
Platelets: 222 10*3/uL (ref 150–400)
RBC: 3.53 MIL/uL — ABNORMAL LOW (ref 3.87–5.11)
RDW: 14.2 % (ref 11.5–15.5)
WBC: 11.4 10*3/uL — ABNORMAL HIGH (ref 4.0–10.5)
nRBC: 0 % (ref 0.0–0.2)

## 2020-12-05 LAB — GLUCOSE, CAPILLARY
Glucose-Capillary: 157 mg/dL — ABNORMAL HIGH (ref 70–99)
Glucose-Capillary: 130 mg/dL — ABNORMAL HIGH (ref 70–99)
Glucose-Capillary: 142 mg/dL — ABNORMAL HIGH (ref 70–99)
Glucose-Capillary: 112 mg/dL — ABNORMAL HIGH (ref 70–99)
Glucose-Capillary: 129 mg/dL — ABNORMAL HIGH (ref 70–99)

## 2020-12-05 LAB — BASIC METABOLIC PANEL
Anion gap: 9 (ref 5–15)
BUN: 32 mg/dL — ABNORMAL HIGH (ref 8–23)
CO2: 28 mmol/L (ref 22–32)
Calcium: 8.6 mg/dL — ABNORMAL LOW (ref 8.9–10.3)
Chloride: 104 mmol/L (ref 98–111)
Creatinine, Ser: 1.07 mg/dL — ABNORMAL HIGH (ref 0.44–1.00)
GFR, Estimated: 55 mL/min — ABNORMAL LOW (ref >60)
Glucose, Bld: 121 mg/dL — ABNORMAL HIGH (ref 70–99)
Potassium: 3.9 mmol/L (ref 3.5–5.1)
Sodium: 141 mmol/L (ref 135–145)

## 2020-12-05 LAB — HEPARIN LEVEL (UNFRACTIONATED): Heparin Unfractionated: 0.33 IU/mL (ref 0.30–0.70)

## 2020-12-05 LAB — APTT
aPTT: 49 seconds — ABNORMAL HIGH (ref 24–36)
aPTT: 62 seconds — ABNORMAL HIGH (ref 24–36)

## 2020-12-05 LAB — MAGNESIUM: Magnesium: 2.1 mg/dL (ref 1.7–2.4)

## 2020-12-05 MED ORDER — DILTIAZEM HCL 30 MG PO TABS
30.0000 mg | ORAL_TABLET | Freq: Four times a day (QID) | ORAL | Status: DC
  Administered 2020-12-05 – 2020-12-06 (×5): 30 mg
  Filled 2020-12-05 (×6): qty 1

## 2020-12-05 MED ORDER — FUROSEMIDE 10 MG/ML IJ SOLN
40.0000 mg | Freq: Once | INTRAMUSCULAR | Status: AC
  Administered 2020-12-05: 40 mg via INTRAVENOUS
  Filled 2020-12-05: qty 4

## 2020-12-05 MED ORDER — AMIODARONE HCL 200 MG PO TABS
200.0000 mg | ORAL_TABLET | Freq: Every day | ORAL | Status: DC
  Administered 2020-12-05 – 2020-12-08 (×4): 200 mg
  Filled 2020-12-05 (×4): qty 1

## 2020-12-05 MED ORDER — POTASSIUM CHLORIDE 20 MEQ PO PACK
20.0000 meq | PACK | Freq: Once | ORAL | Status: AC
  Administered 2020-12-05: 20 meq
  Filled 2020-12-05: qty 1

## 2020-12-05 NOTE — Progress Notes (Signed)
STROKE TEAM PROGRESS NOTE   INTERVAL HISTORY Husband and son are at the bedside. Pt extubated yesterday and tolerating well. Today reclining in bed, awake alert and interactive. Still has expressive aphasia but able to repeat simple sentences and name some objects. Still has RUE weakness but RLE strong. Afib presistent but RVR now resolved, Dr. Doylene Canard is going to change amiodarone and cardizem to per tube.   Per husband, pt had no afib diagnosis in the past and this is the new diagnosis for her. She was on plavix PTA, not on any anticoagulation before.   Vitals:   12/05/20 0300 12/05/20 0315 12/05/20 0348 12/05/20 0400  BP: 94/60 (!) 99/54    Pulse: 90 95    Resp: 16 17    Temp:    99.7 F (37.6 C)  TempSrc:    Oral  SpO2: 95% 95% 93%   Weight:    133.4 kg  Height:       CBC:  Recent Labs  Lab 12/03/20 0329 12/04/20 1301 12/04/20 2145  WBC 10.7* 11.5* 12.8*  NEUTROABS 8.9*  --   --   HGB 10.4* 11.0* 11.1*  HCT 32.2* 36.1 34.3*  MCV 93.9 96.8 93.5  PLT 229 PLATELET CLUMPS NOTED ON SMEAR, UNABLE TO ESTIMATE [id number]   Basic Metabolic Panel:  Recent Labs  Lab 12/03/20 0242 12/03/20 0329 12/04/20 0930 12/04/20 2145  NA  --    < > 141 140  K  --    < > 4.2 3.8  CL  --    < > 110 102  CO2  --    < > 25 29  GLUCOSE  --    < > 156* 156*  BUN  --    < > 24* 24*  CREATININE  --    < > 0.92 1.04*  CALCIUM  --    < > 8.3* 8.9  MG 2.2  --   --   --    < > = values in this interval not displayed.   Lipid Panel:  Recent Labs  Lab 12/03/20 0330  CHOL 142  TRIG 107  100  HDL 40*  CHOLHDL 3.6  VLDL 21  LDLCALC 81   HgbA1c:  Recent Labs  Lab 12/03/20 0330  HGBA1C 6.2*   Urine Drug Screen:  Recent Labs  Lab 12/02/20 1840  LABOPIA NONE DETECTED  COCAINSCRNUR NONE DETECTED  LABBENZ NONE DETECTED  AMPHETMU NONE DETECTED  THCU NONE DETECTED  LABBARB NONE DETECTED    MR Brain WO Contrast 12/02/20 IMPRESSION: Small to moderate acute cortical infarct in the left  frontal parietal cortex primarily in the postcentral gyrus. No associated hemorrhage.  PHYSICAL EXAM  Temp:  [98.8 F (37.1 C)-100.6 F (38.1 C)] 99.7 F (37.6 C) (07/24 0400) Pulse Rate:  [78-144] 95 (07/24 0315) Resp:  [15-26] 17 (07/24 0315) BP: (88-139)/(53-100) 99/54 (07/24 0315) SpO2:  [92 %-100 %] 93 % (07/24 0348) Arterial Line BP: (101-155)/(73-92) 155/84 (07/23 0900) FiO2 (%):  [40 %-100 %] 40 % (07/24 0348) Weight:  [133.4 kg] 133.4 kg (07/24 0400)  General - Well nourished, well developed, not in acute distress.  Ophthalmologic - fundi not visualized due to noncooperation.  Cardiovascular - irregularly irregular heart rate and rhythm.  Neuro - awake, alert, eyes open, difficult answer questions due to expressive aphasia. Difficulty with spontaneous speech, but able to repeat simple sentence but not complex sentences, naming 2/4, following most simple commands with mild psychomotor slowing. No gaze palsy, tracking  bilaterally, visual field full, PERRL. Mild right facial droop. Tongue midline. LUE 4/5 and RUE 3-/5 proximal and distal. Bilateral LEs 4/5, no drift. Sensation symmetrical bilaterally, left FTN intact although slow, gait not tested.    ASSESSMENT/PLAN [name] is a 70 y.o. female with history of hypertension, hyperlipidemia, and prior TIA who presented as a code stroke from [facility] after being found with right sided weakness and aphasia earlier today while admitted for afib RVR. CTA revealed a left MCA LVO. tPA was not given as she was on eliquis. She was taken for thrombectomy.  Stroke - left MCA infarct due to left MCA occlusion s/p thrombectomy with  TICI2c, etiology likely due to newly diagnosed A. fib CT no acute finding CT head and neck left MCA occlusion S/p IR with TICI2c revascularization MRI 12/02/20 - Small to moderate acute cortical infarct in the left frontal parietal cortex primarily in the postcentral gyrus. No associated  hemorrhage. Echocardiogram EF 45 to 50% LDL 81 HgbA1c 6.2 DVT prophylaxis Heparin IV Plavix PTA, currently on heparin IV for anticoagulation. Will transition to eliquis in 2 days if tolerating heparin IV well. Therapy recommendations - CIR Disposition: Pending  Afib RVR Much improved Transition amiodarone and cardizem IV to per tube Cardiology on board - appreciate assistance On digoxin 0.25 mg daily for 2 days Continue metoprolol to 25 mg 3 times daily Continue Heparin IV   Acute hypoxic respiratory failure requiring mechanical ventilation CCM on board Extubated yesterday, tolerating well  History of TIA 05/2017 right arm numbness and weakness lasting for minutes.  Follows with Dr. Evelena Leyden at [facility].  In 07/2017 EF 55 to 60%, carotid Doppler showed left ICA 40 to 59% stenosis.  MRI negative for acute stroke.  Hyperlipidemia No statin at home LDL 81, goal < 70 Start crestor '20mg'$  daily Continue statin on discharge  HTN BP on the low end On metoprolol and diltizam per cardiology Lasix one dose per CCM BP monitoring  Dysphagia On TF '@45'$  Speech on board Needed swallow evaluation today  Other stroke risk factors Morbid obesity, BMI 46.27  Other acute issues Anemia of chronic disease, Hb 10.4 ->11.0->11.1 Leukocytosis WBC 10.7 ->11.5->12.8  AKI creatinine 1.17-> 0.92->1.04  Hospital day # 3  This patient is critically ill due to left MCA stroke, afib RVR, s/p IR, dysphagia and at significant risk of neurological worsening, death form recurrent stroke, heart failure, pneumonia, sepsis, seizure. This patient's care requires constant monitoring of vital signs, hemodynamics, respiratory and cardiac monitoring, review of multiple databases, neurological assessment, discussion with family, other specialists and medical decision making of high complexity. I spent 40 minutes of neurocritical care time in the care of this patient. I had long discussion with husband and son at bedside,  updated pt current condition, treatment plan and potential prognosis, and answered all the questions. They expressed understanding and appreciation.   Rosalin Hawking, MD PhD Stroke Neurology 12/05/2020 11:43 AM   To contact Stroke Continuity provider, please refer to [email or web address] After hours, contact General Neurology

## 2020-12-05 NOTE — Progress Notes (Signed)
NAME:  Candice Perez, MRN:  [id number], DOB:  03-13-1951, LOS: 3 ADMISSION DATE:  12/02/2020, CONSULTATION DATE:  7/21 REFERRING MD:  Estanislado Pandy, CHIEF COMPLAINT:  respiratory failure and vent management s/p acute stroke    History of Present Illness:  This is a 70 year old female who was admitted to [facility] on 7/21 with new Onset atrial fibrillation with RVR.  She has been placed on oral DOAC.  Shortly after arriving she developed rather acute onset aphasia, right-sided weakness and a code stroke was activated.  Stat CT was obtained this was negative for hemorrhage but revealed area concerning left MCA with large volume occlusion tPA was not given as she was on Eliquis she was emergently transferred to [facility] for neuroradiology intervention.  After arrival at [facility] she was directly admitted to interventional radiology where she underwent revascularization of the left MCA with clot extraction.  She was left intubated postprocedure as she had global aphasia preoperatively critical care asked to evaluate and assist with intensive care management in addition to ventilator management.  Pertinent  Medical History  HTN, HL, prior stroke, prior DVT.  Hypothyroidism, depression.  GERD.   Significant Hospital Events: Including procedures, antibiotic start and stop dates in addition to other pertinent events   Initially presented to [facility] with new atrial fib with RVR started on Eliquis.  Shortly after admission developed new global aphasia and right-sided weakness found to have large volume occlusion of the left MCA territory transferred urgently to [facility] where she underwent revascularization interventional radiology.  Return to the intensive care on mechanical ventilator critical care asked to assist with management 7/22 failed SBT, Afib RVR, hypotension, A line rpelaced and pressure sbeeter than cuff, phenyl weaned off, RVR persisted so amio started, diuresed, outs not recorded  accurately 7/23 stop maintenance IVF as overloaded and has Tfs. Still RVR, passing SBT, labs pending but would benefit from more diuresis if kidneys and electrolytes allow 7/24 Extubated 7/23, weaning FiO2 HFNC with ongoing diureses, HR better  Interim History / Subjective:  Extubated. Weaning HFNCH. Diuresing.   Objective   Blood pressure 107/72, pulse 92, temperature 99 F (37.2 C), temperature source Axillary, resp. rate (!) 21, height '5\' 7"'$  (1.702 m), weight 133.4 kg, SpO2 91 %.    FiO2 (%):  [40 %-100 %] 40 %   Intake/Output Summary (Last 24 hours) at 12/05/2020 1012 Last data filed at 12/05/2020 0800 Gross per 24 hour  Intake 2069.85 ml  Output 3700 ml  Net -1630.15 ml    Filed Weights   12/04/20 0500 12/05/20 0400 12/05/20 0800  Weight: 134 kg 133.4 kg 133.4 kg    Examination: General: Obese white female currently sedated requiring mechanical ventilation HENT: Neck is large orally intubated pupils equal reactive Lungs: coarse, ventilated Cardiovascular: Atrial fibrillation, heart rate in the 120s no murmur rub or gallop Abdomen: Abdomen large soft hypoactive bowel sounds Extremities: LE edema present Neuro: Moves all extremities against gravity.  Nods yes no.  Resolved Hospital Problem list     Assessment & Plan:  Acute left MCA stroke(POA) with global aphasia and right-sided weakness, now status post revascularization in interventional radiology -Prior history of stroke Plan Supportive care Serial neuro checks Maintain systolic blood pressure [id number] - 140 Follow-up secondary stroke prevention to be outlined by stroke team, follow-up MRI completed  Acute respiratory failure secondary to ineffective airway clearance after large acute stroke: Improved with diuresis. Extubated 7/23.  Plan HFNC Lasix   Atrial fibrillation with rapid ventricular response (  present on admission at [facility]) -HR better Plan Amio PO, dilt PO, metop PO per cards Heparin gtt  History  of GERD PPI  History of hypothyroidism Synthroid  History of hypertension Holding losartan /hydrochlorothiazide Medications for for RVR as above  History of depression Add Wellbutrin when able (XR cant go down tube)  Best Practice (right click and "Reselect all SmartList Selections" daily)   Diet/type: TF DVT prophylaxis: SCD GI prophylaxis: PPI Lines: N/A Foley:  Yes, and it is still needed Code Status:  full code Last date of multidisciplinary goals of care discussion [per primary ]  Labs   CBC: Recent Labs  Lab 12/02/20 1934 12/03/20 0329 12/04/20 1301 12/04/20 2145 12/05/20 0635  WBC  --  10.7* 11.5* 12.8* 11.4*  NEUTROABS  --  8.9*  --   --   --   HGB 10.9* 10.4* 11.0* 11.1* 10.6*  HCT 32.0* 32.2* 36.1 34.3* 33.1*  MCV  --  93.9 96.8 93.5 93.8  PLT  --  229 PLATELET CLUMPS NOTED ON SMEAR, UNABLE TO ESTIMATE 247 222     Basic Metabolic Panel: Recent Labs  Lab 12/02/20 1934 12/03/20 0242 12/03/20 0329 12/04/20 0930 12/04/20 2145 12/05/20 0635  NA 140  --  139 141 140 141  K 4.5  --  4.2 4.2 3.8 3.9  CL  --   --  107 110 102 104  CO2  --   --  '25 25 29 28  '$ GLUCOSE  --   --  135* 156* 156* 121*  BUN  --   --  20 24* 24* 32*  CREATININE  --   --  1.17* 0.92 1.04* 1.07*  CALCIUM  --   --  8.3* 8.3* 8.9 8.6*  MG  --  2.2  --   --   --  2.1    GFR: Estimated Creatinine Clearance: 67.7 mL/min (A) (by C-G formula based on SCr of 1.07 mg/dL (H)). Recent Labs  Lab 12/03/20 0329 12/04/20 1301 12/04/20 2145 12/05/20 0635  WBC 10.7* 11.5* 12.8* 11.4*     Liver Function Tests: Recent Labs  Lab 12/03/20 0329  AST 30  ALT 31  ALKPHOS 45  BILITOT 0.4  PROT 5.9*  ALBUMIN 3.0*    No results for input(s): LIPASE, AMYLASE in the last 168 hours. No results for input(s): AMMONIA in the last 168 hours.  ABG    Component Value Date/Time   PHART 7.294 (L) 12/02/2020 1934   PCO2ART 53.6 (H) 12/02/2020 1934   PO2ART 172 (H) 12/02/2020 1934   HCO3  26.3 12/02/2020 1934   TCO2 28 12/02/2020 1934   ACIDBASEDEF 1.0 12/02/2020 1934   O2SAT 99.0 12/02/2020 1934     Coagulation Profile: No results for input(s): INR, PROTIME in the last 168 hours.  Cardiac Enzymes: No results for input(s): CKTOTAL, CKMB, CKMBINDEX, TROPONINI in the last 168 hours.  HbA1C: Hgb A1c MFr Bld  Date/Time Value Ref Range Status  12/03/2020 03:30 AM 6.2 (H) 4.8 - 5.6 % Final    Comment:    (NOTE) Pre diabetes:          5.7%-6.4%  Diabetes:              >6.4%  Glycemic control for   <7.0% adults with diabetes   06/15/2017 09:30 AM 6.0 (H) 4.8 - 5.6 % Final    Comment:             Prediabetes: 5.7 - 6.4  Diabetes: >6.4          Glycemic control for adults with diabetes: <7.0     CBG: Recent Labs  Lab 12/04/20 1533 12/04/20 2003 12/04/20 2310 12/05/20 0319 12/05/20 0718  GLUCAP 166* 124* 145* 157* 130*     Review of Systems:   Not able   Past Medical History:  She,  has a past medical history of Arthritis, DVT (deep venous thrombosis) ([place]), Edema, GERD (gastroesophageal reflux disease), Hyperlipidemia, Hypertension, Hypothyroidism, and Varicose veins.   Surgical History:   Past Surgical History:  Procedure Laterality Date   CHOLECYSTECTOMY     Laparoscopic   CYSTOCELE REPAIR     HERNIA REPAIR     IR CT HEAD LTD  12/02/2020   IR PERCUTANEOUS ART THROMBECTOMY/INFUSION INTRACRANIAL INC DIAG ANGIO  12/02/2020   RADIOLOGY WITH ANESTHESIA N/A 12/02/2020   Procedure: IR WITH ANESTHESIA;  Surgeon: Luanne Bras, MD;  Location: [place];  Service: Radiology;  Laterality: N/A;   TONSILLECTOMY     TOTAL KNEE ARTHROPLASTY Left 08/26/2013   Procedure: LEFT TOTAL KNEE REVISION;  Surgeon: Mauri Pole, MD;  Location: [facility];  Service: Orthopedics;  Laterality: Left;   VARICOSE VEIN SURGERY       Social History:   reports that she quit smoking about 10 years ago. Her smoking use included cigarettes. She has never used smokeless  tobacco. She reports that she does not drink alcohol and does not use drugs.   Family History:  Her family history includes Cancer in her father and mother; Congestive Heart Failure in her mother; Other in her father.   Allergies No Known Allergies   Home Medications  Prior to Admission medications   Medication Sig Start Date End Date Taking? Authorizing Provider  buPROPion (WELLBUTRIN XL) 150 MG 24 hr tablet Take 150 mg by mouth daily.    [provider]  clopidogrel (PLAVIX) 75 MG tablet Take 1 tablet (75 mg total) by mouth daily. 08/07/17   Marcial Pacas, MD  levothyroxine (SYNTHROID, LEVOTHROID) 100 MCG tablet Take 100 mcg by mouth daily before breakfast.    [provider]  losartan-hydrochlorothiazide (HYZAAR) 100-25 MG per tablet Take 1 tablet by mouth every morning.    [provider]     Critical care time    CRITICAL CARE N/a   Lanier Clam, MD See Amion for contact info

## 2020-12-05 NOTE — Consult Note (Signed)
Ref: Ronita Hipps, MD   Subjective:  Awake. HR in 90's.  Monitor : Atrial fibrillation continues.  Objective:  Vital Signs in the last 24 hours: Temp:  [98.8 F (37.1 C)-100.6 F (38.1 C)] 99.7 F (37.6 C) (07/24 0400) Pulse Rate:  [78-144] 99 (07/24 0737) Cardiac Rhythm: Atrial fibrillation (07/24 0400) Resp:  [15-26] 19 (07/24 0645) BP: (88-139)/(53-100) 109/71 (07/24 0645) SpO2:  [91 %-100 %] 91 % (07/24 0737) Arterial Line BP: (155)/(84-92) 155/84 (07/23 0900) FiO2 (%):  [40 %-100 %] 40 % (07/24 0737) Weight:  [133.4 kg] 133.4 kg (07/24 0400)  Physical Exam: BP Readings from Last 1 Encounters:  12/05/20 109/71     Wt Readings from Last 1 Encounters:  12/05/20 133.4 kg    Weight change: -0.6 kg Body mass index is 46.06 kg/m. HEENT: [place]/AT, Eyes-Blue, Conjunctiva-Pink, Sclera-Non-icteric Neck: No JVD, No bruit, Trachea midline. Lungs:  Clearing, Bilateral. Cardiac:  Irregular rhythm, normal S1 and S2, no S3. II/VI systolic murmur. Abdomen:  Soft, non-tender. BS present. Extremities:  No edema present. No cyanosis. No clubbing. CNS: AxOx3, Cranial nerves grossly intact, moves all 4 extremities.  Skin: Warm and dry.   Intake/Output from previous day: 07/23 0701 - 07/24 0700 In: 2223.5 [I.V.:1188.5; NG/GT:1035] Out: 3700 [Urine:3700]    Lab Results: BMET    Component Value Date/Time   NA 140 12/04/2020 2145   NA 141 12/04/2020 0930   NA 139 12/03/2020 0329   NA 142 06/15/2017 0930   K 3.8 12/04/2020 2145   K 4.2 12/04/2020 0930   K 4.2 12/03/2020 0329   CL 102 12/04/2020 2145   CL 110 12/04/2020 0930   CL 107 12/03/2020 0329   CO2 29 12/04/2020 2145   CO2 25 12/04/2020 0930   CO2 25 12/03/2020 0329   GLUCOSE 156 (H) 12/04/2020 2145   GLUCOSE 156 (H) 12/04/2020 0930   GLUCOSE 135 (H) 12/03/2020 0329   BUN 24 (H) 12/04/2020 2145   BUN 24 (H) 12/04/2020 0930   BUN 20 12/03/2020 0329   BUN 19 06/15/2017 0930   CREATININE 1.04 (H) 12/04/2020 2145    CREATININE 0.92 12/04/2020 0930   CREATININE 1.17 (H) 12/03/2020 0329   CALCIUM 8.9 12/04/2020 2145   CALCIUM 8.3 (L) 12/04/2020 0930   CALCIUM 8.3 (L) 12/03/2020 0329   GFRNONAA 57 (L) 12/04/2020 2145   GFRNONAA >60 12/04/2020 0930   GFRNONAA 50 (L) 12/03/2020 0329   GFRAA 68 06/15/2017 0930   GFRAA 64 (L) 08/28/2013 0429   GFRAA 77 (L) 08/27/2013 0456   CBC    Component Value Date/Time   WBC 11.4 (H) 12/05/2020 0635   RBC 3.53 (L) 12/05/2020 0635   HGB 10.6 (L) 12/05/2020 0635   HGB 12.9 06/15/2017 0930   HCT 33.1 (L) 12/05/2020 0635   HCT 38.6 06/15/2017 0930   PLT 222 12/05/2020 0635   PLT 265 06/15/2017 0930   MCV 93.8 12/05/2020 0635   MCV 86 06/15/2017 0930   MCH 30.0 12/05/2020 0635   MCHC 32.0 12/05/2020 0635   RDW 14.2 12/05/2020 0635   RDW 14.5 06/15/2017 0930   LYMPHSABS 1.0 12/03/2020 0329   MONOABS 0.7 12/03/2020 0329   EOSABS 0.0 12/03/2020 0329   BASOSABS 0.0 12/03/2020 0329   HEPATIC Function Panel Recent Labs    12/03/20 0329  PROT 5.9*   HEMOGLOBIN A1C No components found for: HGA1C,  MPG CARDIAC ENZYMES No results found for: CKTOTAL, CKMB, CKMBINDEX, TROPONINI BNP No results for input(s): PROBNP in  the last 8760 hours. TSH No results for input(s): TSH in the last 8760 hours. CHOLESTEROL Recent Labs    12/03/20 0330  CHOL 142    Scheduled Meds:   stroke: mapping our early stages of recovery book   Does not apply Once   amiodarone  200 mg Per Tube Daily   chlorhexidine gluconate (MEDLINE KIT)  15 mL Mouth Rinse BID   Chlorhexidine Gluconate Cloth  6 each Topical Daily   digoxin  0.25 mg Intravenous Daily   diltiazem  30 mg Per Tube Q6H   feeding supplement (PROSource TF)  90 mL Per Tube BID   insulin aspart  0-15 Units Subcutaneous Q4H   levothyroxine  100 mcg Per Tube Q0600   metoprolol tartrate  25 mg Per Tube TID   mupirocin ointment  1 application Nasal BID   rosuvastatin  20 mg Per Tube Daily   Continuous Infusions:  sodium  chloride Stopped (12/03/20 1316)   feeding supplement (OSMOLITE 1.5 CAL) 1,000 mL (12/03/20 1600)   heparin 1,900 Units/hr (12/05/20 0600)   PRN Meds:.acetaminophen **OR** acetaminophen (TYLENOL) oral liquid 160 mg/5 mL **OR** acetaminophen, fentaNYL (SUBLIMAZE) injection, levalbuterol, senna-docusate  Assessment/Plan:  Acute stroke Atrial fibrillation Acute respiratory failure with hypoxia Hypothyroidism Obesity GERD Type 2 DM  Plan: Change diltiazem and amiodarone to per tube.   LOS: 3 days   Time spent including chart review, lab review, examination, discussion with patient/Nurse : 30 min   Dixie Dials  MD  12/05/2020, 7:42 AM

## 2020-12-05 NOTE — Progress Notes (Signed)
Inpatient Rehab Admissions Coordinator Note:   Per PT/OT recommendations, pt was screened for CIR candidacy by Gayland Curry, MS, CCC-SLP.  At this time we are recommending an inpatient rehab consult. AC will place consult order per protocol. Please contact me with questions.    Gayland Curry, [place], [place] Admissions Coordinator [phone] 12/05/20 11:06 AM

## 2020-12-05 NOTE — Progress Notes (Signed)
Inpatient Rehab Admissions:  Inpatient Rehab Consult received.  I met with paitent, husband, and son at the bedside for rehabilitation assessment and to discuss goals and expectations of an inpatient rehab admission.  All acknowledged understanding. All interested in pt pursuing CIR.  Will continue to follow.  Signed: Gayland Curry, [place], [place] Admissions Coordinator [phone]

## 2020-12-05 NOTE — Progress Notes (Signed)
ANTICOAGULATION CONSULT NOTE - Follow Up Consult  Pharmacy Consult for heparin Indication: atrial fibrillation  No Known Allergies  Patient Measurements: Height: '5\' 7"'$  (170.2 cm) Weight: 133.4 kg (294 lb 1.5 oz) IBW/kg (Calculated) : 61.6 Heparin Dosing Weight: 94kg  Vital Signs: Temp: 99 F (37.2 C) (07/24 0800) Temp Source: Axillary (07/24 0800) BP: 104/65 (07/24 0800) Pulse Rate: 91 (07/24 0800)  Labs: Recent Labs    12/03/20 0329 12/04/20 0224 12/04/20 0224 12/04/20 0930 12/04/20 1301 12/04/20 2145 12/05/20 0635  HGB 10.4*  --   --   --  11.0* 11.1* 10.6*  HCT 32.2*  --   --   --  36.1 34.3* 33.1*  PLT 229  --   --   --  PLATELET CLUMPS NOTED ON SMEAR, UNABLE TO ESTIMATE 247 222  APTT  --  28   < > 34  --  45* 49*  HEPARINUNFRC  --  0.17*  --  0.22*  --   --  0.33  CREATININE 1.17*  --   --  0.92  --  1.04*  --    < > = values in this interval not displayed.     Estimated Creatinine Clearance: 69.7 mL/min (A) (by C-G formula based on SCr of 1.04 mg/dL (H)).   Medical History: Past Medical History:  Diagnosis Date   Arthritis    DVT (deep venous thrombosis) (HCC)    Edema    GERD (gastroesophageal reflux disease)    OCCASIONAL - PRILOSEC IF NEEDED   Hyperlipidemia    Hypertension    no meds now   Hypothyroidism    Varicose veins     Assessment: 8 YOF presenting as code stroke from [place], [place] on Eliquis started at [facility] (7/21 AM), s/p thrombectomy and ok per neuro for heparin gtt start with low end goals and no bolus.    Heparin level 0.33 and aPTT 49 on heparin 1900 units/hr. aPTT is subtherapeutic. Heparin level still impacted from previous apixaban. Per RN no issues with IV infusion or access. Hgb 10.6. Plt wnl. No bleeding noted.   Goal of Therapy:  Heparin level 0.3-0.5 units/ml aPTT 66-85 seconds Monitor platelets by anticoagulation protocol: Yes   Plan:  Increase heparin to 2100 units/hr Check 8 hr aPTT Monitor daily aPTT,  heparin level, and CBC F/u ability to transition to oral anticoagulant   Cristela Felt, PharmD, BCPS Clinical Pharmacist 12/05/2020 8:38 AM

## 2020-12-05 NOTE — Progress Notes (Signed)
ANTICOAGULATION CONSULT NOTE - Follow Up Consult  Pharmacy Consult for heparin Indication: atrial fibrillation  No Known Allergies  Patient Measurements: Height: '5\' 7"'$  (170.2 cm) Weight: 133.4 kg (294 lb 1.5 oz) IBW/kg (Calculated) : 61.6 Heparin Dosing Weight: 94kg  Vital Signs: Temp: 98.8 F (37.1 C) (07/24 1600) Temp Source: Axillary (07/24 1600) BP: 121/72 (07/24 1600) Pulse Rate: 96 (07/24 1600)  Labs: Recent Labs    12/04/20 0224 12/04/20 0930 12/04/20 1301 12/04/20 2145 12/05/20 0635  HGB  --   --  11.0* 11.1* 10.6*  HCT  --   --  36.1 34.3* 33.1*  PLT  --   --  PLATELET CLUMPS NOTED ON SMEAR, UNABLE TO ESTIMATE 247 222  APTT 28 34  --  45* 49*  HEPARINUNFRC 0.17* 0.22*  --   --  0.33  CREATININE  --  0.92  --  1.04* 1.07*     Estimated Creatinine Clearance: 67.7 mL/min (A) (by C-G formula based on SCr of 1.07 mg/dL (H)).   Medical History: Past Medical History:  Diagnosis Date   Arthritis    DVT (deep venous thrombosis) (HCC)    Edema    GERD (gastroesophageal reflux disease)    OCCASIONAL - PRILOSEC IF NEEDED   Hyperlipidemia    Hypertension    no meds now   Hypothyroidism    Varicose veins     Assessment: 54 YOF presenting as code stroke from [place], [place] on Eliquis started at [facility] (7/21 AM), s/p thrombectomy and ok per neuro for heparin gtt start with low end goals and no bolus.    aPTT level this evening is SUBtherapeutic (aPTT 62 << 49, goal of 66-102). No bleeding or infusion issues noted per discussion with RN   Goal of Therapy:  Heparin level 0.3-0.5 units/ml aPTT 66-85 seconds Monitor platelets by anticoagulation protocol: Yes   Plan:  - Increase Heparin to 2200 units/hr (22 ml/hr) - Will continue to monitor for any signs/symptoms of bleeding and will follow up with aPTT and heparin level in 8 hours   Thank you for allowing pharmacy to be a part of this patient's care.  Alycia Rossetti, PharmD, BCPS Clinical  Pharmacist Clinical phone for 12/05/2020: [phone] 12/05/2020 5:39 PM   **Pharmacist phone directory can now be found on [email or web address] ([email or web address]).  Listed under [place].

## 2020-12-06 ENCOUNTER — Inpatient Hospital Stay (HOSPITAL_COMMUNITY): Payer: Medicare HMO

## 2020-12-06 ENCOUNTER — Encounter (HOSPITAL_COMMUNITY): Payer: Self-pay | Admitting: Neurology

## 2020-12-06 ENCOUNTER — Other Ambulatory Visit: Payer: Self-pay

## 2020-12-06 DIAGNOSIS — I4891 Unspecified atrial fibrillation: Secondary | ICD-10-CM | POA: Diagnosis not present

## 2020-12-06 DIAGNOSIS — D72829 Elevated white blood cell count, unspecified: Secondary | ICD-10-CM

## 2020-12-06 DIAGNOSIS — I639 Cerebral infarction, unspecified: Secondary | ICD-10-CM | POA: Diagnosis not present

## 2020-12-06 DIAGNOSIS — I6602 Occlusion and stenosis of left middle cerebral artery: Secondary | ICD-10-CM | POA: Diagnosis not present

## 2020-12-06 DIAGNOSIS — J9601 Acute respiratory failure with hypoxia: Secondary | ICD-10-CM

## 2020-12-06 DIAGNOSIS — N179 Acute kidney failure, unspecified: Secondary | ICD-10-CM | POA: Diagnosis not present

## 2020-12-06 LAB — CBC
HCT: 34.9 % — ABNORMAL LOW (ref 36.0–46.0)
Hemoglobin: 10.9 g/dL — ABNORMAL LOW (ref 12.0–15.0)
MCH: 29.5 pg (ref 26.0–34.0)
MCHC: 31.2 g/dL (ref 30.0–36.0)
MCV: 94.6 fL (ref 80.0–100.0)
Platelets: 272 10*3/uL (ref 150–400)
RBC: 3.69 MIL/uL — ABNORMAL LOW (ref 3.87–5.11)
RDW: 13.7 % (ref 11.5–15.5)
WBC: 10.4 10*3/uL (ref 4.0–10.5)
nRBC: 0 % (ref 0.0–0.2)

## 2020-12-06 LAB — BASIC METABOLIC PANEL
Anion gap: 6 (ref 5–15)
BUN: 34 mg/dL — ABNORMAL HIGH (ref 8–23)
CO2: 30 mmol/L (ref 22–32)
Calcium: 9.2 mg/dL (ref 8.9–10.3)
Chloride: 105 mmol/L (ref 98–111)
Creatinine, Ser: 1.03 mg/dL — ABNORMAL HIGH (ref 0.44–1.00)
GFR, Estimated: 58 mL/min — ABNORMAL LOW (ref >60)
Glucose, Bld: 117 mg/dL — ABNORMAL HIGH (ref 70–99)
Potassium: 4 mmol/L (ref 3.5–5.1)
Sodium: 141 mmol/L (ref 135–145)

## 2020-12-06 LAB — GLUCOSE, CAPILLARY
Glucose-Capillary: 121 mg/dL — ABNORMAL HIGH (ref 70–99)
Glucose-Capillary: 127 mg/dL — ABNORMAL HIGH (ref 70–99)
Glucose-Capillary: 120 mg/dL — ABNORMAL HIGH (ref 70–99)
Glucose-Capillary: 103 mg/dL — ABNORMAL HIGH (ref 70–99)
Glucose-Capillary: 111 mg/dL — ABNORMAL HIGH (ref 70–99)
Glucose-Capillary: 109 mg/dL — ABNORMAL HIGH (ref 70–99)

## 2020-12-06 LAB — ECHOCARDIOGRAM LIMITED BUBBLE STUDY
Calc EF: 44.7 %
Single Plane A2C EF: 44.8 %
Single Plane A4C EF: 42.9 %

## 2020-12-06 LAB — HEPARIN LEVEL (UNFRACTIONATED): Heparin Unfractionated: 0.61 IU/mL (ref 0.30–0.70)

## 2020-12-06 LAB — APTT: aPTT: 81 seconds — ABNORMAL HIGH (ref 24–36)

## 2020-12-06 MED ORDER — DIGOXIN 125 MCG PO TABS
0.1250 mg | ORAL_TABLET | Freq: Every day | ORAL | Status: DC
  Administered 2020-12-06 – 2020-12-09 (×4): 0.125 mg via ORAL
  Filled 2020-12-06 (×5): qty 1

## 2020-12-06 MED ORDER — WHITE PETROLATUM EX OINT
TOPICAL_OINTMENT | CUTANEOUS | Status: AC
  Administered 2020-12-06: 0.2
  Filled 2020-12-06: qty 28.35

## 2020-12-06 MED ORDER — SODIUM CHLORIDE 0.9% FLUSH
10.0000 mL | Freq: Once | INTRAVENOUS | Status: AC
  Administered 2020-12-06: 10 mL via INTRAVENOUS

## 2020-12-06 MED ORDER — PERFLUTREN LIPID MICROSPHERE
1.0000 mL | INTRAVENOUS | Status: AC | PRN
  Administered 2020-12-06: 2 mL via INTRAVENOUS
  Filled 2020-12-06: qty 10

## 2020-12-06 MED ORDER — ENSURE ENLIVE PO LIQD
237.0000 mL | Freq: Two times a day (BID) | ORAL | Status: DC
  Administered 2020-12-07 – 2020-12-09 (×4): 237 mL via ORAL

## 2020-12-06 MED ORDER — DILTIAZEM HCL 30 MG PO TABS
60.0000 mg | ORAL_TABLET | Freq: Four times a day (QID) | ORAL | Status: DC
  Administered 2020-12-06 – 2020-12-07 (×6): 60 mg
  Filled 2020-12-06 (×5): qty 1
  Filled 2020-12-06: qty 2
  Filled 2020-12-06 (×2): qty 1

## 2020-12-06 MED ORDER — APIXABAN 5 MG PO TABS
5.0000 mg | ORAL_TABLET | Freq: Two times a day (BID) | ORAL | Status: DC
  Administered 2020-12-06 – 2020-12-09 (×7): 5 mg via ORAL
  Filled 2020-12-06 (×7): qty 1

## 2020-12-06 MED FILL — Sodium Chloride IV Soln 0.9%: INTRAVENOUS | Qty: 250 | Status: AC

## 2020-12-06 MED FILL — Heparin Sodium (Porcine) Inj 5000 Unit/ML: INTRAMUSCULAR | Qty: 5.6 | Status: AC

## 2020-12-06 NOTE — Evaluation (Addendum)
Speech Language Pathology Evaluation Patient Details Name: Candice Perez MRN: [id number] DOB: 01/17/1951 Today's Date: 12/06/2020 Time: [id number] SLP Time Calculation (min) (ACUTE ONLY): 15 min  Problem List:  Patient Active Problem List   Diagnosis Date Noted   Acute respiratory failure with hypoxia ([place])    Cerebrovascular accident (CVA) ([place]) 12/02/2020   Middle cerebral artery embolism, left 12/02/2020   TIA (transient ischemic attack) 06/15/2017   Morbid obesity ([place]) 08/27/2013   Expected blood loss anemia 08/27/2013   S/P left TKA 08/26/2013   Deep vein thrombosis of left lower extremity ([place]) 01/03/2013   Varicose veins of lower extremities with other complications [id number]   Chronic venous insufficiency 12/09/2010   Past Medical History:  Past Medical History:  Diagnosis Date   Arthritis    DVT (deep venous thrombosis) (HCC)    Edema    GERD (gastroesophageal reflux disease)    OCCASIONAL - PRILOSEC IF NEEDED   Hyperlipidemia    Hypertension    no meds now   Hypothyroidism    Varicose veins    Past Surgical History:  Past Surgical History:  Procedure Laterality Date   CHOLECYSTECTOMY     Laparoscopic   CYSTOCELE REPAIR     HERNIA REPAIR     IR CT HEAD LTD  12/02/2020   IR PERCUTANEOUS ART THROMBECTOMY/INFUSION INTRACRANIAL INC DIAG ANGIO  12/02/2020   RADIOLOGY WITH ANESTHESIA N/A 12/02/2020   Procedure: IR WITH ANESTHESIA;  Surgeon: Luanne Bras, MD;  Location: [place];  Service: Radiology;  Laterality: N/A;   TONSILLECTOMY     TOTAL KNEE ARTHROPLASTY Left 08/26/2013   Procedure: LEFT TOTAL KNEE REVISION;  Surgeon: Mauri Pole, MD;  Location: [facility];  Service: Orthopedics;  Laterality: Left;   VARICOSE VEIN SURGERY     HPI:  The pt is a 70 yo female presenting to [facility] on 7/21 due to new onset afib with RVR, but required transfer to [facility] from [place] via EMS on 7/21 after acute onset aphasia and R sided weakness. MRI small to moderate  acute cortical infarct in the left frontal parietal cortex primarily in the postcentral gyrus She is s/p revascularization of L MCA on 7/21. PMH includes: HTN, HLD, TIA, stoke, DVT, depression, and GERD.   Assessment / Plan / Recommendation Clinical Impression  Pt demonstrates cranial nerve VII and XII impairments leading to reduced lingual/labial ROM and sensation on right. She demonstrates characteristics of a transcortical motor aphasia with comprehension > expression, mild difficulty in object naming, and repetition of words, phrases and short sentences mostly intact. With hesitations she expresses herself in phrases and sentences marked by decreased awareness of phonemic errors. Motor speech/apraxic errors consist of substitutions and distortions comprehension for one step commands was adequate,  mild deficits for moderate level info and moderate for more complex. She repeated words and phrases accurately and 30% for longer sentence. Basic and moderate level cognitive abilities were functional for assessed tasks. Suspect she will have difficulty attending to information presented simultaneously and more complex. She is oriented x 4 and basic problem solving of immediate needs adequate. Continue ST on acute and CIR recommended.    SLP Assessment  SLP Recommendation/Assessment: Patient needs continued Speech Lanaguage Pathology Services SLP Visit Diagnosis: Aphasia (R47.01);Cognitive communication deficit (R41.841)    Follow Up Recommendations  Inpatient Rehab    Frequency and Duration min 2x/week  2 weeks      SLP Evaluation Cognition  Overall Cognitive Status: Impaired/Different from baseline Arousal/Alertness: Awake/alert Orientation Level:  Oriented X4 Attention: Sustained Sustained Attention: Appears intact (suspect deficits with alterating and divided attention) Memory:  (will assess further) Awareness: Impaired Awareness Impairment: Emergent impairment;Anticipatory  impairment Problem Solving: Appears intact (for basic) Safety/Judgment: Other (comment) (prognosis good)       Comprehension  Auditory Comprehension Overall Auditory Comprehension: Impaired Yes/No Questions: Impaired Basic Immediate Environment Questions: 75-100% accurate (90%) Commands: Impaired One Step Basic Commands: 75-100% accurate (100%) Two Step Basic Commands: 75-100% accurate Multistep Basic Commands: 75-100% accurate Conversation: Complex Visual Recognition/Discrimination Discrimination: Not tested Reading Comprehension Reading Status:  (TBA)    Expression Expression Primary Mode of Expression: Verbal Verbal Expression Overall Verbal Expression: Impaired Initiation: No impairment Level of Generative/Spontaneous Verbalization: Conversation;Sentence Repetition: No impairment (30% with longer sentence) Naming: Impairment Confrontation: Impaired (90%) Verbal Errors: Phonemic paraphasias (decreased syntax/morphology) Pragmatics: No impairment Written Expression Dominant Hand: Right Written Expression: Exceptions to [facility] Self Formulation Ability: Word   Oral / Motor  Oral Motor/Sensory Function Overall Oral Motor/Sensory Function: Moderate impairment Facial ROM: Reduced right;Suspected CN VII (facial) dysfunction Facial Symmetry: Abnormal symmetry right;Suspected CN VII (facial) dysfunction Facial Strength: Reduced right;Suspected CN VII (facial) dysfunction Facial Sensation: Reduced right;Suspected CN V (Trigeminal) dysfunction Lingual ROM: Reduced right;Suspected CN XII (hypoglossal) dysfunction Lingual Symmetry: Within Functional Limits Lingual Strength: Within Functional Limits Mandible: Within Functional Limits Motor Speech Overall Motor Speech: Impaired Respiration: Within functional limits Phonation: Normal Resonance: Within functional limits Articulation: Impaired Level of Impairment: Phrase Intelligibility: Intelligible   GO                     Houston Siren 12/06/2020, 12:26 PM  [name] Colvin Caroli.Ed Risk analyst [phone] Office [phone]

## 2020-12-06 NOTE — Progress Notes (Addendum)
ANTICOAGULATION CONSULT NOTE - Follow Up Consult  Pharmacy Consult for heparin > apixaban Indication: atrial fibrillation  No Known Allergies  Patient Measurements: Height: '5\' 7"'$  (170.2 cm) Weight: 132.9 kg (292 lb 15.9 oz) IBW/kg (Calculated) : 61.6 Heparin Dosing Weight: 94kg  Vital Signs: Temp: 99.5 F (37.5 C) (07/25 0800) Temp Source: Oral (07/25 0800) BP: 126/81 (07/25 0800) Pulse Rate: 109 (07/25 0800)  Labs: Recent Labs    12/04/20 0930 12/04/20 1301 12/04/20 2145 12/05/20 0635 12/05/20 1717 12/06/20 0817  HGB  --    < > 11.1* 10.6*  --  10.9*  HCT  --    < > 34.3* 33.1*  --  34.9*  PLT  --    < > 247 222  --  272  APTT 34  --  45* 49* 62* 81*  HEPARINUNFRC 0.22*  --   --  0.33  --  0.61  CREATININE 0.92  --  1.04* 1.07*  --   --    < > = values in this interval not displayed.     Estimated Creatinine Clearance: 67.6 mL/min (A) (by C-G formula based on SCr of 1.07 mg/dL (H)).   Medical History: Past Medical History:  Diagnosis Date   Arthritis    DVT (deep venous thrombosis) (HCC)    Edema    GERD (gastroesophageal reflux disease)    OCCASIONAL - PRILOSEC IF NEEDED   Hyperlipidemia    Hypertension    no meds now   Hypothyroidism    Varicose veins     Assessment: 34 YOF presenting as code stroke from [place], [place] on Eliquis started at [facility] (7/21 AM), s/p thrombectomy and ok per neuro for heparin gtt start with low end goals and no bolus.    aPTT and heparin levels 07/24 evening were subtherapeutic (aPTT 62 << 49, goal of 66-102). After heparin increased to 53m/hour (2200units/hr), aPTT and heparin levels have remained correlated, although heparin level still slightly supratherapeutic.   Per neurology recs, will transition from heparin gtt to apixaban.  No bleeding or infusion issues noted per RN.   Goal of Therapy:  Heparin level 0.3-0.5 units/ml aPTT 66-85 seconds Monitor platelets by anticoagulation protocol: Yes   Plan:  -DC  heparin gtt  -Start Apixaban '5mg'$  twice daily  - Will continue to monitor for any signs/symptoms of bleeding    Thank you for allowing pharmacy to be a part of this patient's care.  AArdyth Harps PharmD Clinical Pharmacist 12/06/2020 10:14 AM   **Pharmacist phone directory can now be found on [email or web address] ([email or web address]).  Listed under [place]

## 2020-12-06 NOTE — Consult Note (Signed)
Ref: Ronita Hipps, MD   Subjective:  Awake. HR in 90-100's. Monitor: atrial fibrillation. Trying to write with left hand.  Objective:  Vital Signs in the last 24 hours: Temp:  [98 F (36.7 C)-99.6 F (37.6 C)] 98.9 F (37.2 C) (07/25 0400) Pulse Rate:  [83-118] 107 (07/25 0600) Cardiac Rhythm: Atrial fibrillation (07/24 2000) Resp:  [11-26] 17 (07/25 0600) BP: (68-130)/(48-103) 99/79 (07/25 0500) SpO2:  [88 %-97 %] 95 % (07/25 0600) FiO2 (%):  [40 %] 40 % (07/25 0003) Weight:  [132.9 kg] 132.9 kg (07/25 0400)  Physical Exam: BP Readings from Last 1 Encounters:  12/06/20 99/79     Wt Readings from Last 1 Encounters:  12/06/20 132.9 kg    Weight change: 0 kg Body mass index is 45.89 kg/m. HEENT: [place]/AT, Eyes-Blue, Conjunctiva-Pink, Sclera-Non-icteric Neck: No JVD, No bruit, Trachea midline. Lungs:  Clear, Bilateral. Cardiac:  Irregular rhythm, normal S1 and S2, no S3. II/VI systolic murmur. Abdomen:  Soft, non-tender. BS present. Extremities:  No edema present. No cyanosis. No clubbing. CNS: AxOx3, Cranial nerves grossly intact, moves all 4 extremities. Right sided weakness. Skin: Warm and dry.   Intake/Output from previous day: 07/24 0701 - 07/25 0700 In: 1783 [I.V.:658; NG/GT:1125] Out: 4950 [Urine:4950]    Lab Results: BMET    Component Value Date/Time   NA 141 12/05/2020 0635   NA 140 12/04/2020 2145   NA 141 12/04/2020 0930   NA 142 06/15/2017 0930   K 3.9 12/05/2020 0635   K 3.8 12/04/2020 2145   K 4.2 12/04/2020 0930   CL 104 12/05/2020 0635   CL 102 12/04/2020 2145   CL 110 12/04/2020 0930   CO2 28 12/05/2020 0635   CO2 29 12/04/2020 2145   CO2 25 12/04/2020 0930   GLUCOSE 121 (H) 12/05/2020 0635   GLUCOSE 156 (H) 12/04/2020 2145   GLUCOSE 156 (H) 12/04/2020 0930   BUN 32 (H) 12/05/2020 0635   BUN 24 (H) 12/04/2020 2145   BUN 24 (H) 12/04/2020 0930   BUN 19 06/15/2017 0930   CREATININE 1.07 (H) 12/05/2020 0635   CREATININE 1.04 (H)  12/04/2020 2145   CREATININE 0.92 12/04/2020 0930   CALCIUM 8.6 (L) 12/05/2020 0635   CALCIUM 8.9 12/04/2020 2145   CALCIUM 8.3 (L) 12/04/2020 0930   GFRNONAA 55 (L) 12/05/2020 0635   GFRNONAA 57 (L) 12/04/2020 2145   GFRNONAA >60 12/04/2020 0930   GFRAA 68 06/15/2017 0930   GFRAA 64 (L) 08/28/2013 0429   GFRAA 77 (L) 08/27/2013 0456   CBC    Component Value Date/Time   WBC 11.4 (H) 12/05/2020 0635   RBC 3.53 (L) 12/05/2020 0635   HGB 10.6 (L) 12/05/2020 0635   HGB 12.9 06/15/2017 0930   HCT 33.1 (L) 12/05/2020 0635   HCT 38.6 06/15/2017 0930   PLT 222 12/05/2020 0635   PLT 265 06/15/2017 0930   MCV 93.8 12/05/2020 0635   MCV 86 06/15/2017 0930   MCH 30.0 12/05/2020 0635   MCHC 32.0 12/05/2020 0635   RDW 14.2 12/05/2020 0635   RDW 14.5 06/15/2017 0930   LYMPHSABS 1.0 12/03/2020 0329   MONOABS 0.7 12/03/2020 0329   EOSABS 0.0 12/03/2020 0329   BASOSABS 0.0 12/03/2020 0329   HEPATIC Function Panel Recent Labs    12/03/20 0329  PROT 5.9*   HEMOGLOBIN A1C No components found for: HGA1C,  MPG CARDIAC ENZYMES No results found for: CKTOTAL, CKMB, CKMBINDEX, TROPONINI BNP No results for input(s): PROBNP in the last  8760 hours. TSH No results for input(s): TSH in the last 8760 hours. CHOLESTEROL Recent Labs    12/03/20 0330  CHOL 142    Scheduled Meds:   stroke: mapping our early stages of recovery book   Does not apply Once   amiodarone  200 mg Per Tube Daily   chlorhexidine gluconate (MEDLINE KIT)  15 mL Mouth Rinse BID   Chlorhexidine Gluconate Cloth  6 each Topical Daily   diltiazem  60 mg Per Tube Q6H   feeding supplement (PROSource TF)  90 mL Per Tube BID   insulin aspart  0-15 Units Subcutaneous Q4H   levothyroxine  100 mcg Per Tube Q0600   metoprolol tartrate  25 mg Per Tube TID   mupirocin ointment  1 application Nasal BID   rosuvastatin  20 mg Per Tube Daily   white petrolatum       Continuous Infusions:  sodium chloride Stopped (12/03/20 1316)    feeding supplement (OSMOLITE 1.5 CAL) 1,000 mL (12/05/20 1823)   heparin 2,200 Units/hr (12/06/20 0705)   PRN Meds:.acetaminophen **OR** acetaminophen (TYLENOL) oral liquid 160 mg/5 mL **OR** acetaminophen, fentaNYL (SUBLIMAZE) injection, levalbuterol, senna-docusate  Assessment/Plan:  Acute stroke Atrial fibrillation Acute respiratory failure with hypoxia Hypothyroidism Type 2 DM Morbid Obesity  Plan:  Increase diltiazem dose to improve HR control. Resume digoxin low dose for HR control. Continue amiodarone.   LOS: 4 days   Time spent including chart review, lab review, examination, discussion with patient :  min   Dixie Dials  MD  12/06/2020, 8:01 AM

## 2020-12-06 NOTE — Plan of Care (Signed)
  Problem: Education: Goal: Knowledge of disease or condition will improve Outcome: Progressing Goal: Knowledge of patient specific risk factors addressed and post discharge goals established will improve Outcome: Progressing   Problem: Self-Care: Goal: Ability to participate in self-care as condition permits will improve Outcome: Progressing Goal: Ability to communicate needs accurately will improve Outcome: Progressing   Problem: Nutrition: Goal: Risk of aspiration will decrease Outcome: Progressing Goal: Dietary intake will improve Outcome: Progressing

## 2020-12-06 NOTE — Progress Notes (Signed)
The MD asked me to come to the bedside to assess Candice Perez's right arm. Bruising, redness, and blistering present. I reviewed her chart and it does not seem as if she had an IV in this location. She does have a R wrist present and she had an Aline in her R wrist that was removed on 7/23. Her L peripheral IV also has redness present with tracking noted. Left forearm IV removed. Redness marked. Pharmacist, Suezanne Jacquet, was also consulted. Thayer Ohm D

## 2020-12-06 NOTE — Progress Notes (Signed)
Physical Therapy Treatment Patient Details Name: Candice Perez MRN: [id number] DOB: 03/25/1951 Today's Date: 12/06/2020    History of Present Illness The pt is a 70 yo female presenting to [facility] on 7/21 due to new onset afib with RVR, but required transfer to [facility] from [place] via EMS on 7/21 after acute onset aphasia and R sided weakness. The pt is now s/p revascularization of L MCA on 7/21. PMH includes: HTN, HLD, TIA, stoke, DVT, depression, and GERD.    PT Comments    Pt with improved R LE movement however remains to have minimal active R UE movement. Pt with noted impaired sensation of R LE limiting ability to tolerate standing and step. Pt did tolerate use of stedy for std pvt to chair well. Pt was able to site EOB x 12 min for LE there ex and to work on achieving and maintaining midline posture. Pt in good spirits and gives great effort during therapy. Continue to recommend CIR upon d/c. Acute PT to cont to follow.   Follow Up Recommendations  CIR     Equipment Recommendations   (TBD at next venue)    Recommendations for Other Services Rehab consult     Precautions / Restrictions Precautions Precautions: Fall Precaution Comments: HHFNC, watch HR Restrictions Weight Bearing Restrictions: No    Mobility  Bed Mobility Overal bed mobility: Needs Assistance Bed Mobility: Supine to Sit     Supine to sit: Mod assist;+2 for physical assistance;HOB elevated     General bed mobility comments: pt able to bring both LEs off to L side of EOB, modA for trunk elevation due to inability to use R UE and body habitus    Transfers Overall transfer level: Needs assistance Equipment used: Ambulation equipment used;2 person hand held assist Transfers: Sit to/from [facility] Sit to Stand: Mod assist;+2 physical assistance Stand pivot transfers: Mod assist (used bari stedy)       General transfer comment: pt able to stand however due to impaired  sensation on R LE pt unable to step and sustaing standing without buckling, used stedy to std pvt, pt able to pull up with minAx2 x 2  Ambulation/Gait             General Gait Details: unable at this time   Stairs             Wheelchair Mobility    Modified Rankin (Stroke Patients Only) Modified Rankin (Stroke Patients Only) Pre-Morbid Rankin Score: No symptoms Modified Rankin: Severe disability     Balance                                            Cognition Arousal/Alertness: Awake/alert Behavior During Therapy: WFL for tasks assessed/performed Overall Cognitive Status: Impaired/Different from baseline Area of Impairment: Following commands;Problem solving                       Following Commands: Follows multi-step commands with increased time;Follows one step commands consistently   Awareness: Emergent Problem Solving: Slow processing;Requires verbal cues;Requires tactile cues General Comments: pt able to follow commands consistently, aware of deficits including speech recommendations, incresaed time to process      Exercises General Exercises - Lower Extremity Long Arc Quad: AROM;Both;20 reps;Seated Hip Flexion/Marching: AROM;Both;20 reps;Seated    General Comments General comments (skin integrity, edema, etc.): VSS, pt  with noted blisters on bilat forearms, RN and MD aware      Pertinent Vitals/Pain Pain Assessment: Faces Faces Pain Scale: Hurts even more Pain Location: R UE with ROM especially wrist/fingers flex/ext Pain Descriptors / Indicators: Grimacing;Tightness (noted edema) Pain Intervention(s): Monitored during session    Home Living                      Prior Function            PT Goals (current goals can now be found in the care plan section) Progress towards PT goals: Progressing toward goals    Frequency    Min 4X/week      PT Plan Current plan remains appropriate     Co-evaluation              AM-PAC PT "6 Clicks" Mobility   Outcome Measure  Help needed turning from your back to your side while in a flat bed without using bedrails?: A Little Help needed moving from lying on your back to sitting on the side of a flat bed without using bedrails?: A Little Help needed moving to and from a bed to a chair (including a wheelchair)?: A Lot Help needed standing up from a chair using your arms (e.g., wheelchair or bedside chair)?: A Lot Help needed to walk in hospital room?: Total Help needed climbing 3-5 steps with a railing? : Total 6 Click Score: 12    End of Session Equipment Utilized During Treatment: Oxygen Activity Tolerance: Patient tolerated treatment well Patient left: in chair;with call bell/phone within reach;with chair alarm set;with family/visitor present Nurse Communication: Mobility status PT Visit Diagnosis: Other abnormalities of gait and mobility (R26.89);Muscle weakness (generalized) (M62.81);Hemiplegia and hemiparesis Hemiplegia - Right/Left: Right Hemiplegia - dominant/non-dominant: Dominant Hemiplegia - caused by: Cerebral infarction     Time: [id number] PT Time Calculation (min) (ACUTE ONLY): 42 min  Charges:  $Therapeutic Exercise: 8-22 mins $Therapeutic Activity: 8-22 mins $Neuromuscular Re-education: 8-22 mins                     Kittie Plater, PT, DPT Acute Rehabilitation Services Pager #: [phone] Office #: [phone]    Berline Lopes 12/06/2020, 2:59 PM

## 2020-12-06 NOTE — Progress Notes (Addendum)
NAME:  Candice Perez, MRN:  [id number], DOB:  [birth year], LOS: 4 ADMISSION DATE:  12/02/2020, CONSULTATION DATE:  7/21 REFERRING MD:  Estanislado Pandy, CHIEF COMPLAINT:  respiratory failure and vent management s/p acute stroke    History of Present Illness:  This is a 70 year old female who was admitted to [facility] on 7/21 with new Onset atrial fibrillation with RVR.  She has been placed on oral DOAC.  Shortly after arriving she developed rather acute onset aphasia, right-sided weakness and a code stroke was activated.  Stat CT was obtained this was negative for hemorrhage but revealed area concerning left MCA with large volume occlusion tPA was not given as she was on Eliquis she was emergently transferred to [facility] for neuroradiology intervention.  After arrival at [facility] she was directly admitted to interventional radiology where she underwent revascularization of the left MCA with clot extraction.  She was left intubated postprocedure as she had global aphasia preoperatively critical care asked to evaluate and assist with intensive care management in addition to ventilator management.  Pertinent  Medical History  HTN, HL, prior stroke, prior DVT.  Hypothyroidism, depression.  GERD.   Significant Hospital Events: Including procedures, antibiotic start and stop dates in addition to other pertinent events   Initially presented to [facility] with new atrial fib with RVR started on Eliquis.  Shortly after admission developed new global aphasia and right-sided weakness found to have large volume occlusion of the left MCA territory transferred urgently to [facility] where she underwent revascularization interventional radiology.  Return to the intensive care on mechanical ventilator critical care asked to assist with management 7/22 failed SBT, Afib RVR, hypotension, A line rpelaced and pressure sbeeter than cuff, phenyl weaned off, RVR persisted so amio started, diuresed, outs not recorded  accurately 7/23 stop maintenance IVF as overloaded and has Tfs. Still RVR, passing SBT, labs pending but would benefit from more diuresis if kidneys and electrolytes allow 7/24 Extubated 7/23, weaning FiO2 HFNC with ongoing diureses, HR better  Interim History / Subjective:  -4,950 mL in 24 hours with diuresis; Net positive +657 mL afib w/ HR 110s  Remains on Heated HFNC 20 l/m 40%  Objective   Blood pressure (!) 110/95, pulse (!) 115, temperature 98.9 F (37.2 C), temperature source Oral, resp. rate (!) 22, height '5\' 7"'$  (1.702 m), weight 132.9 kg, SpO2 95 %.    FiO2 (%):  [40 %] 40 %   Intake/Output Summary (Last 24 hours) at 12/06/2020 0703 Last data filed at 12/06/2020 0500 Gross per 24 hour  Intake 1649.25 ml  Output 4950 ml  Net -3300.75 ml    Filed Weights   12/05/20 0400 12/05/20 0800 12/06/20 0400  Weight: 133.4 kg 133.4 kg 132.9 kg    Examination: General:  NAD HEENT: MM pink/moist; cor track and HHFNC in place Neuro: AO; Moves BLE to command; Moves LUE but unable to move RUE; PERRL CV: s1s2, afib with rate 110s, no m/r/g PULM:  dim clear BS bilaterally; HHFNC 40% 20 l/m GI: soft, bsx4 active  Extremities: warm/dry, no edema  Skin: no rashes or lesions appreciated  Labs 7/24: BUN 32 from 24. Creat 1.07 from 1.04 WBC trending down 11.4 from 12.8; afebrile Hgb 10.6 from 11   Resolved Hospital Problem list     Assessment & Plan:  Acute left MCA stroke(POA) with global aphasia and right-sided weakness, now status post revascularization in interventional radiology -Prior history of stroke P: -Followed by stroke team -serial neuro checks -SBP  goal 120-140 -continue statin -DVT ppx: on heparin IV -PT/OT/SLP -ordered echo w/ bubble  Acute respiratory failure secondary to ineffective airway clearance after large acute stroke: Improved with diuresis. Extubated 7/23.  P: -will transition to salter HFNC from [facility]; wean O2 for sats >92% -Consider giving more  lasix if oxygenation worsens -flutter/CPT TID -OOB as tolerated  Atrial fibrillation with rapid ventricular response (present on admission at [facility]) P: -cards following -continue amio, diltiaxem, metoprolol PO -continue Heparin IV  History of GERD P: -PPI  History of hypothyroidism P: -continue Synthroid  History of hypertension P: -Holding home meds: valsartan /amlodipine -continue current medication for rate control  History of depression P: -consider adding home Wellbutrin when able to take po (XR cant go down tube)  Best Practice (right click and "Reselect all SmartList Selections" daily)   Diet/type: TF DVT prophylaxis: systemic heparin GI prophylaxis: PPI Lines: N/A Foley:  N/A Code Status:  full code Last date of multidisciplinary goals of care discussion [per primary ]  Labs   CBC: Recent Labs  Lab 12/02/20 1934 12/03/20 0329 12/04/20 1301 12/04/20 2145 12/05/20 0635  WBC  --  10.7* 11.5* 12.8* 11.4*  NEUTROABS  --  8.9*  --   --   --   HGB 10.9* 10.4* 11.0* 11.1* 10.6*  HCT 32.0* 32.2* 36.1 34.3* 33.1*  MCV  --  93.9 96.8 93.5 93.8  PLT  --  229 PLATELET CLUMPS NOTED ON SMEAR, UNABLE TO ESTIMATE 247 222     Basic Metabolic Panel: Recent Labs  Lab 12/02/20 1934 12/03/20 0242 12/03/20 0329 12/04/20 0930 12/04/20 2145 12/05/20 0635  NA 140  --  139 141 140 141  K 4.5  --  4.2 4.2 3.8 3.9  CL  --   --  107 110 102 104  CO2  --   --  '25 25 29 28  '$ GLUCOSE  --   --  135* 156* 156* 121*  BUN  --   --  20 24* 24* 32*  CREATININE  --   --  1.17* 0.92 1.04* 1.07*  CALCIUM  --   --  8.3* 8.3* 8.9 8.6*  MG  --  2.2  --   --   --  2.1    GFR: Estimated Creatinine Clearance: 67.6 mL/min (A) (by C-G formula based on SCr of 1.07 mg/dL (H)). Recent Labs  Lab 12/03/20 0329 12/04/20 1301 12/04/20 2145 12/05/20 0635  WBC 10.7* 11.5* 12.8* 11.4*     Liver Function Tests: Recent Labs  Lab 12/03/20 0329  AST 30  ALT 31  ALKPHOS 45   BILITOT 0.4  PROT 5.9*  ALBUMIN 3.0*    No results for input(s): LIPASE, AMYLASE in the last 168 hours. No results for input(s): AMMONIA in the last 168 hours.  ABG    Component Value Date/Time   PHART 7.294 (L) 12/02/2020 1934   PCO2ART 53.6 (H) 12/02/2020 1934   PO2ART 172 (H) 12/02/2020 1934   HCO3 26.3 12/02/2020 1934   TCO2 28 12/02/2020 1934   ACIDBASEDEF 1.0 12/02/2020 1934   O2SAT 99.0 12/02/2020 1934     Coagulation Profile: No results for input(s): INR, PROTIME in the last 168 hours.  Cardiac Enzymes: No results for input(s): CKTOTAL, CKMB, CKMBINDEX, TROPONINI in the last 168 hours.  HbA1C: Hgb A1c MFr Bld  Date/Time Value Ref Range Status  12/03/2020 03:30 AM 6.2 (H) 4.8 - 5.6 % Final    Comment:    (NOTE) Pre diabetes:  5.7%-6.4%  Diabetes:              >6.4%  Glycemic control for   <7.0% adults with diabetes   06/15/2017 09:30 AM 6.0 (H) 4.8 - 5.6 % Final    Comment:             Prediabetes: 5.7 - 6.4          Diabetes: >6.4          Glycemic control for adults with diabetes: <7.0     CBG: Recent Labs  Lab 12/05/20 0718 12/05/20 1623 12/05/20 1907 12/05/20 2307 12/06/20 0403  GLUCAP 130* 142* 112* 129* 121*     Review of Systems:   Not able   Past Medical History:  She,  has a past medical history of Arthritis, DVT (deep venous thrombosis) ([place]), Edema, GERD (gastroesophageal reflux disease), Hyperlipidemia, Hypertension, Hypothyroidism, and Varicose veins.   Surgical History:   Past Surgical History:  Procedure Laterality Date   CHOLECYSTECTOMY     Laparoscopic   CYSTOCELE REPAIR     HERNIA REPAIR     IR CT HEAD LTD  12/02/2020   IR PERCUTANEOUS ART THROMBECTOMY/INFUSION INTRACRANIAL INC DIAG ANGIO  12/02/2020   RADIOLOGY WITH ANESTHESIA N/A 12/02/2020   Procedure: IR WITH ANESTHESIA;  Surgeon: Luanne Bras, MD;  Location: [place];  Service: Radiology;  Laterality: N/A;   TONSILLECTOMY     TOTAL KNEE ARTHROPLASTY  Left 08/26/2013   Procedure: LEFT TOTAL KNEE REVISION;  Surgeon: Mauri Pole, MD;  Location: [facility];  Service: Orthopedics;  Laterality: Left;   VARICOSE VEIN SURGERY       Social History:   reports that she quit smoking about 10 years ago. Her smoking use included cigarettes. She has never used smokeless tobacco. She reports that she does not drink alcohol and does not use drugs.   Family History:  Her family history includes Cancer in her father and mother; Congestive Heart Failure in her mother; Other in her father.   Allergies No Known Allergies   Home Medications  Prior to Admission medications   Medication Sig Start Date End Date Taking? Authorizing Provider  buPROPion (WELLBUTRIN XL) 150 MG 24 hr tablet Take 150 mg by mouth daily.    [provider]  clopidogrel (PLAVIX) 75 MG tablet Take 1 tablet (75 mg total) by mouth daily. 08/07/17   Marcial Pacas, MD  levothyroxine (SYNTHROID, LEVOTHROID) 100 MCG tablet Take 100 mcg by mouth daily before breakfast.    [provider]  losartan-hydrochlorothiazide (HYZAAR) 100-25 MG per tablet Take 1 tablet by mouth every morning.    [provider]     Critical care time: 35 minutes   CRITICAL CARE  JD Rexene Agent [facility] 12/06/2020, 8:01 AM  Please see [email or web address] for pager details.  From 7A-7P if no response, please call [phone]. After hours, please call ELink [phone].

## 2020-12-06 NOTE — Progress Notes (Signed)
STROKE TEAM PROGRESS NOTE   INTERVAL HISTORY PT and RN are at the bedside. Pt reclining in bed, comfortably, however, she still has afib RVR at 110-120s. Dr. Doylene Canard increased cardizem dose and restarted digoxin and continued metoprolol. Pt no complains. She passed swallow, now on dys 2 and thin liquid, will d/c tube feeding and allow her to eat but keep cortrak now in case not eating adequately   Vitals:   12/06/20 0600 12/06/20 0800 12/06/20 0825 12/06/20 0923  BP:  126/81    Pulse: (!) 107 (!) 109    Resp: 17 (!) 23    Temp:  99.5 F (37.5 C)    TempSrc:  Oral    SpO2: 95% 96% 96% 99%  Weight:      Height:       CBC:  Recent Labs  Lab 12/03/20 0329 12/04/20 1301 12/05/20 0635 12/06/20 0817  WBC 10.7*   < > 11.4* 10.4  NEUTROABS 8.9*  --   --   --   HGB 10.4*   < > 10.6* 10.9*  HCT 32.2*   < > 33.1* 34.9*  MCV 93.9   < > 93.8 94.6  PLT 229   < > 222 272   < > = values in this interval not displayed.   Basic Metabolic Panel:  Recent Labs  Lab 12/03/20 0242 12/03/20 0329 12/05/20 0635 12/06/20 0817  NA  --    < > 141 141  K  --    < > 3.9 4.0  CL  --    < > 104 105  CO2  --    < > 28 30  GLUCOSE  --    < > 121* 117*  BUN  --    < > 32* 34*  CREATININE  --    < > 1.07* 1.03*  CALCIUM  --    < > 8.6* 9.2  MG 2.2  --  2.1  --    < > = values in this interval not displayed.   Lipid Panel:  Recent Labs  Lab 12/03/20 0330  CHOL 142  TRIG 107  100  HDL 40*  CHOLHDL 3.6  VLDL 21  LDLCALC 81   HgbA1c:  Recent Labs  Lab 12/03/20 0330  HGBA1C 6.2*   Urine Drug Screen:  Recent Labs  Lab 12/02/20 1840  LABOPIA NONE DETECTED  COCAINSCRNUR NONE DETECTED  LABBENZ NONE DETECTED  AMPHETMU NONE DETECTED  THCU NONE DETECTED  LABBARB NONE DETECTED    MR Brain WO Contrast 12/02/20 IMPRESSION: Small to moderate acute cortical infarct in the left frontal parietal cortex primarily in the postcentral gyrus. No associated hemorrhage.  PHYSICAL EXAM  Temp:   [98 F (36.7 C)-99.6 F (37.6 C)] 99.5 F (37.5 C) (07/25 0800) Pulse Rate:  [83-118] 109 (07/25 0800) Resp:  [11-26] 23 (07/25 0800) BP: (68-130)/(48-103) 126/81 (07/25 0800) SpO2:  [89 %-99 %] 99 % (07/25 0923) FiO2 (%):  [40 %] 40 % (07/25 0825) Weight:  [132.9 kg] 132.9 kg (07/25 0400)  General - Well nourished, well developed, not in acute distress.  Ophthalmologic - fundi not visualized due to noncooperation.  Cardiovascular - irregularly irregular heart rate and rhythm.  Neuro - awake, alert, eyes open, difficult answer questions due to expressive aphasia. Difficulty with spontaneous speech, but able to repeat simple sentence but not complex sentences, naming 2/4, following most simple commands with mild psychomotor slowing. No gaze palsy, tracking bilaterally, visual field full, PERRL. Mild right facial  droop. Tongue midline. LUE 4/5 and RUE 3-/5 proximal and distal. Bilateral LEs 4/5, no drift. Sensation symmetrical bilaterally, left FTN intact although slow, gait not tested.    ASSESSMENT/PLAN [name] is a 70 y.o. female with history of hypertension, hyperlipidemia, and prior TIA who presented as a code stroke from [facility] after being found with right sided weakness and aphasia earlier today while admitted for afib RVR. CTA revealed a left MCA LVO. tPA was not given as she was on eliquis. She was taken for thrombectomy.  Stroke - left MCA infarct due to left MCA occlusion s/p thrombectomy with  TICI2c, etiology likely due to newly diagnosed A. fib CT no acute finding CT head and neck left MCA occlusion S/p IR with TICI2c revascularization MRI 12/02/20 - Small to moderate acute cortical infarct in the left frontal parietal cortex primarily in the postcentral gyrus. No associated hemorrhage. Echocardiogram EF 45 to 50% LDL 81 HgbA1c 6.2 DVT prophylaxis Heparin IV Plavix PTA, currently on heparin IV for anticoagulation. Will transition to eliquis Therapy  recommendations - CIR Disposition: Pending  Afib RVR Improving  Cardiology on board - appreciate assistance On digoxin 0.125 mg daily Continue metoprolol to 25 mg 3 times daily Increased cardizem to '60mg'$  Q6h Transition Heparin IV to eliquis  History of TIA 05/2017 right arm numbness and weakness lasting for minutes.  Follows with Dr. Evelena Leyden at [facility].  In 07/2017 EF 55 to 60%, carotid Doppler showed left ICA 40 to 59% stenosis.  MRI negative for acute stroke.  Hyperlipidemia No statin at home LDL 81, goal < 70 Start crestor '20mg'$  daily Continue statin on discharge  HTN BP on the low end On metoprolol and diltizam per cardiology BP monitoring  Dysphagia Passed swallow On dys2 and thin Will d/c TF and encourage po intake Speech on board  Other stroke risk factors Morbid obesity, BMI 46.27  Other acute issues Anemia of chronic disease, Hb 10.4 ->11.0->11.1->10.9 Leukocytosis WBC 10.7 ->11.5->12.8->10.4 AKI creatinine 1.17-> 0.92->1.04->1.03  Hospital day # 4  This patient is critically ill due to left MCA stroke, s/p IR, afib RVR, dysphagia and at significant risk of neurological worsening, death form recurrent stroke, heart failure, aspiration, hemorrhagic conversion, bleeding from [facility]. This patient's care requires constant monitoring of vital signs, hemodynamics, respiratory and cardiac monitoring, review of multiple databases, neurological assessment, discussion with family, other specialists and medical decision making of high complexity. I spent 35 minutes of neurocritical care time in the care of this patient.  Rosalin Hawking, MD PhD Stroke Neurology 12/06/2020 10:54 AM   To contact Stroke Continuity provider, please refer to [email or web address] After hours, contact General Neurology

## 2020-12-06 NOTE — Evaluation (Signed)
Clinical/Bedside Swallow Evaluation Patient Details  Name: Candice Perez MRN: [id number] Date of Birth: 10-07-1950  Today's Date: 12/06/2020 Time: SLP Start Time (ACUTE ONLY): 64 SLP Stop Time (ACUTE ONLY): 0947 SLP Time Calculation (min) (ACUTE ONLY): 15 min  Past Medical History:  Past Medical History:  Diagnosis Date   Arthritis    DVT (deep venous thrombosis) (HCC)    Edema    GERD (gastroesophageal reflux disease)    OCCASIONAL - PRILOSEC IF NEEDED   Hyperlipidemia    Hypertension    no meds now   Hypothyroidism    Varicose veins    Past Surgical History:  Past Surgical History:  Procedure Laterality Date   CHOLECYSTECTOMY     Laparoscopic   CYSTOCELE REPAIR     HERNIA REPAIR     IR CT HEAD LTD  12/02/2020   IR PERCUTANEOUS ART THROMBECTOMY/INFUSION INTRACRANIAL INC DIAG ANGIO  12/02/2020   RADIOLOGY WITH ANESTHESIA N/A 12/02/2020   Procedure: IR WITH ANESTHESIA;  Surgeon: Luanne Bras, MD;  Location: [place];  Service: Radiology;  Laterality: N/A;   TONSILLECTOMY     TOTAL KNEE ARTHROPLASTY Left 08/26/2013   Procedure: LEFT TOTAL KNEE REVISION;  Surgeon: Mauri Pole, MD;  Location: [facility];  Service: Orthopedics;  Laterality: Left;   VARICOSE VEIN SURGERY     HPI:  The pt is a 70 yo female presenting to [facility] on 7/21 due to new onset afib with RVR, but required transfer to [facility] from [place] via EMS on 7/21 after acute onset aphasia and R sided weakness. MRI small to moderate acute cortical infarct in the left frontal parietal cortex primarily in the postcentral gyrus She is s/p revascularization of L MCA on 7/21. PMH includes: HTN, HLD, TIA, stoke, DVT, depression, and GERD.   Assessment / Plan / Recommendation Clinical Impression  Cranial nerve VII and XII involvement led to reduced right side facial/lingual ROM and decreased facial/labial sensation with residue. Timing and subjective ROM during pharyngeal swallow was unremarkable. Vocal quality did  not change and no coughing throughout multiple straw sips (unable to achieve 3 oz volumes). Recommend Dys 2/thin, meds whole in puree, assist with feeding when needed. ST to follow on acute and recommend CIR discharge. SLP Visit Diagnosis: Dysphagia, unspecified (R13.10)    Aspiration Risk  Mild aspiration risk    Diet Recommendation Dysphagia 2 (Fine chop);Thin liquid   Liquid Administration via: Straw;Cup Medication Administration: Whole meds with puree Supervision: Patient able to self feed;Intermittent supervision to cue for compensatory strategies Compensations: Slow rate;Small sips/bites;Lingual sweep for clearance of pocketing Postural Changes: Seated upright at 90 degrees;Remain upright for at least 30 minutes after po intake    Other  Recommendations Oral Care Recommendations: Oral care BID   Follow up Recommendations Inpatient Rehab      Frequency and Duration min 2x/week  2 weeks       Prognosis Prognosis for Safe Diet Advancement: Good      Swallow Study   General Date of Onset: 12/02/20 HPI: The pt is a 70 yo female presenting to [facility] on 7/21 due to new onset afib with RVR, but required transfer to [facility] from [place] via EMS on 7/21 after acute onset aphasia and R sided weakness. MRI small to moderate acute cortical infarct in the left frontal parietal cortex primarily in the postcentral gyrus She is s/p revascularization of L MCA on 7/21. PMH includes: HTN, HLD, TIA, stoke, DVT, depression, and GERD. Type of Study: Bedside Swallow  Evaluation Previous Swallow Assessment: none Diet Prior to this Study: NPO;NG Tube Temperature Spikes Noted: No Respiratory Status: Other (comment) (FiO2 40, flow 15) History of Recent Intubation: Yes Length of Intubations (days):  (surgery only) Date extubated: 12/02/20 Behavior/Cognition: Alert;Cooperative;Pleasant mood;Requires cueing Oral Cavity Assessment: Dried secretions Oral Care Completed by SLP: Yes Oral Cavity -  Dentition: Adequate natural dentition Vision: Functional for self-feeding Self-Feeding Abilities: Needs assist Patient Positioning: Upright in bed Baseline Vocal Quality: Normal Volitional Cough: Weak Volitional Swallow: Able to elicit    Oral/Motor/Sensory Function Overall Oral Motor/Sensory Function: Moderate impairment Facial ROM: Reduced right;Suspected CN VII (facial) dysfunction Facial Symmetry: Abnormal symmetry right;Suspected CN VII (facial) dysfunction Facial Strength: Reduced right;Suspected CN VII (facial) dysfunction Facial Sensation: Reduced right;Suspected CN V (Trigeminal) dysfunction Lingual ROM: Reduced right;Suspected CN XII (hypoglossal) dysfunction Lingual Symmetry: Within Functional Limits Lingual Strength: Within Functional Limits Mandible: Within Functional Limits   Ice Chips Ice chips: Not tested   Thin Liquid Thin Liquid: Impaired Presentation: Cup;Straw Oral Phase Impairments: Reduced labial seal Oral Phase Functional Implications: Right anterior spillage    Nectar Thick Nectar Thick Liquid: Not tested   Honey Thick Honey Thick Liquid: Not tested   Puree Puree: Impaired Presentation: Spoon Oral Phase Impairments: Poor awareness of bolus Oral Phase Functional Implications: Oral residue;Other (comment) (right labial residue)   Solid     Solid: Impaired Oral Phase Impairments: Poor awareness of bolus Oral Phase Functional Implications: Oral residue Pharyngeal Phase Impairments:  (none)      Candice Perez, Candice Perez 12/06/2020,11:58 AM  Candice Perez Candice Perez.Ed Risk analyst [phone] Office [phone]

## 2020-12-06 NOTE — Progress Notes (Signed)
Nutrition Follow-up  DOCUMENTATION CODES:   Morbid obesity  INTERVENTION:   Encourage PO intake  Ensure Enlive po BID, each supplement provides 350 kcal and 20 grams of protein  Magic cup TID with meals, each supplement provides 290 kcal and 9 grams of protein   NUTRITION DIAGNOSIS:   Inadequate oral intake related to dysphagia as evidenced by meal completion < 50% (modified diet). Ongoing.   GOAL:   Patient will meet greater than or equal to 90% of their needs Progressing.   MONITOR:   TF tolerance  REASON FOR ASSESSMENT:   Consult, Ventilator Enteral/tube feeding initiation and management  ASSESSMENT:   Pt admitted to [facility] on 7/21 with new onset Afib with RVR after arriving pt develped aphasia and found to have large L MCA.  Diet advanced; cortrak left in place if pt unable to meet nutrition needs orally.  Therapy recommends CIR.    7/21 tx emergently to [facility] for IR revascularization and clot extraction 7/22 cortrak placed; tip gastric  7/25 diet advanced; cortrak remains in place in the event pt does not eat   Medications reviewed and include: SSI Labs reviewed:  CBG's: 120-127     Diet Order:   Diet Order             DIET DYS 2 Room service appropriate? Yes; Fluid consistency: Thin  Diet effective now                   EDUCATION NEEDS:   No education needs have been identified at this time  Skin:  Skin Assessment: Reviewed RN Assessment  Last BM:  unknown  Height:   Ht Readings from Last 1 Encounters:  12/05/20 '5\' 7"'$  (1.702 m)    Weight:   Wt Readings from Last 1 Encounters:  12/06/20 132.9 kg    Ideal Body Weight:     BMI:  Body mass index is 45.89 kg/m.  Estimated Nutritional Needs:   Kcal:  1800-2000  Protein:  100-120 grams  Fluid:  >1.8 L/day  Lockie Pares., RD, LDN, CNSC See AMiON for contact information

## 2020-12-06 NOTE — Progress Notes (Signed)
  Echocardiogram 2D Echocardiogram with definity bubble study has been performed.  Darlina Sicilian M 12/06/2020, 1:51 PM

## 2020-12-06 NOTE — Progress Notes (Signed)
Transitions of Care Team following this patient for discharge planning.  Currently, patient not medically stable on 15L HFNC; will continue to follow as patient progresses. CIR also following patient.    Aalyah Mansouri LCSW

## 2020-12-07 ENCOUNTER — Inpatient Hospital Stay (HOSPITAL_COMMUNITY): Payer: Medicare HMO

## 2020-12-07 DIAGNOSIS — I6602 Occlusion and stenosis of left middle cerebral artery: Secondary | ICD-10-CM | POA: Diagnosis not present

## 2020-12-07 DIAGNOSIS — I63412 Cerebral infarction due to embolism of left middle cerebral artery: Secondary | ICD-10-CM | POA: Diagnosis not present

## 2020-12-07 DIAGNOSIS — I4891 Unspecified atrial fibrillation: Secondary | ICD-10-CM | POA: Diagnosis not present

## 2020-12-07 LAB — CBC
HCT: 35.7 % — ABNORMAL LOW (ref 36.0–46.0)
Hemoglobin: 11 g/dL — ABNORMAL LOW (ref 12.0–15.0)
MCH: 29.3 pg (ref 26.0–34.0)
MCHC: 30.8 g/dL (ref 30.0–36.0)
MCV: 95.2 fL (ref 80.0–100.0)
Platelets: 295 10*3/uL (ref 150–400)
RBC: 3.75 MIL/uL — ABNORMAL LOW (ref 3.87–5.11)
RDW: 13.5 % (ref 11.5–15.5)
WBC: 9.5 10*3/uL (ref 4.0–10.5)
nRBC: 0 % (ref 0.0–0.2)

## 2020-12-07 LAB — GLUCOSE, CAPILLARY
Glucose-Capillary: 92 mg/dL (ref 70–99)
Glucose-Capillary: 110 mg/dL — ABNORMAL HIGH (ref 70–99)
Glucose-Capillary: 132 mg/dL — ABNORMAL HIGH (ref 70–99)
Glucose-Capillary: 104 mg/dL — ABNORMAL HIGH (ref 70–99)
Glucose-Capillary: 168 mg/dL — ABNORMAL HIGH (ref 70–99)
Glucose-Capillary: 109 mg/dL — ABNORMAL HIGH (ref 70–99)

## 2020-12-07 LAB — BASIC METABOLIC PANEL
Anion gap: 8 (ref 5–15)
BUN: 36 mg/dL — ABNORMAL HIGH (ref 8–23)
CO2: 28 mmol/L (ref 22–32)
Calcium: 9.5 mg/dL (ref 8.9–10.3)
Chloride: 103 mmol/L (ref 98–111)
Creatinine, Ser: 1.04 mg/dL — ABNORMAL HIGH (ref 0.44–1.00)
GFR, Estimated: 57 mL/min — ABNORMAL LOW (ref >60)
Glucose, Bld: 102 mg/dL — ABNORMAL HIGH (ref 70–99)
Potassium: 4.1 mmol/L (ref 3.5–5.1)
Sodium: 139 mmol/L (ref 135–145)

## 2020-12-07 MED ORDER — DILTIAZEM HCL 30 MG PO TABS
60.0000 mg | ORAL_TABLET | Freq: Four times a day (QID) | ORAL | Status: DC
  Administered 2020-12-08 (×2): 60 mg via ORAL
  Filled 2020-12-07 (×2): qty 2

## 2020-12-07 NOTE — Progress Notes (Addendum)
NAME:  Candice Perez, MRN:  [id number], DOB:  1950/06/21, LOS: 5 ADMISSION DATE:  12/02/2020, CONSULTATION DATE:  7/21 REFERRING MD:  Estanislado Pandy, CHIEF COMPLAINT:  respiratory failure and vent management s/p acute stroke    History of Present Illness:  This is a 70 year old female who was admitted to [facility] on 7/21 with new Onset atrial fibrillation with RVR.  She has been placed on oral DOAC.  Shortly after arriving she developed rather acute onset aphasia, right-sided weakness and a code stroke was activated.  Stat CT was obtained this was negative for hemorrhage but revealed area concerning left MCA with large volume occlusion tPA was not given as she was on Eliquis she was emergently transferred to [facility] for neuroradiology intervention.  After arrival at [facility] she was directly admitted to interventional radiology where she underwent revascularization of the left MCA with clot extraction.  She was left intubated postprocedure as she had global aphasia preoperatively critical care asked to evaluate and assist with intensive care management in addition to ventilator management.  Pertinent  Medical History  HTN, HL, prior stroke, prior DVT.  Hypothyroidism, depression.  GERD.   Significant Hospital Events: Including procedures, antibiotic start and stop dates in addition to other pertinent events   Initially presented to [facility] with new atrial fib with RVR started on Eliquis.  Shortly after admission developed new global aphasia and right-sided weakness found to have large volume occlusion of the left MCA territory transferred urgently to [facility] where she underwent revascularization interventional radiology.  Return to the intensive care on mechanical ventilator critical care asked to assist with management 7/22 failed SBT, Afib RVR, hypotension, A line rpelaced and pressure sbeeter than cuff, phenyl weaned off, RVR persisted so amio started, diuresed, outs not recorded  accurately 7/23 stop maintenance IVF as overloaded and has Tfs. Still RVR, passing SBT, labs pending but would benefit from more diuresis if kidneys and electrolytes allow 7/24 Extubated 7/23, weaning FiO2 HFNC with ongoing diureses, HR better 7/25: cards increased diltiazem dose and added digoxin for better afib HR control; continue amio; stopping heparin and switched to eliquis. Switched to HFNC from [facility] 7/26: on 8 liters HFNC   Interim History / Subjective:  Cards increased diltiazem dose and added digoxin for better afib HR control; continue amio; stopping heparin and switched to eliquis HR 90-100s afib UOP -1,300 in 24 hours SLP advancing diet; cor track remains in place On 8 L HFNC w/ sats 100%   Objective   Blood pressure (!) 112/95, pulse (!) 101, temperature 99 F (37.2 C), temperature source Oral, resp. rate 17, height '5\' 7"'$  (1.702 m), weight 132.9 kg, SpO2 100 %.    FiO2 (%):  [40 %] 40 %   Intake/Output Summary (Last 24 hours) at 12/07/2020 0650 Last data filed at 12/07/2020 0330 Gross per 24 hour  Intake 176.81 ml  Output 1300 ml  Net -1123.19 ml    Filed Weights   12/05/20 0400 12/05/20 0800 12/06/20 0400  Weight: 133.4 kg 133.4 kg 132.9 kg    Examination: General:  NAD HEENT: MM pink/moist; cor track and HFNC in place Neuro: AO; Moves BLE to command; Moves LUE well. Moves RUE today but weaker than Left. PERRL CV: s1s2, afib with rate 100s, no m/r/g PULM:  dim clear BS bilaterally; HFNC 8 L w/ sats 100% GI: soft, bsx4 active  Extremities: warm/dry, no edema  Skin: RUE forearm blisters and redness noted, no pain to palpation; LUE forearm phlebitis with  tracking into arm, no pain to palpation.  Labs 7/26: BUN 36 from 34; creat 1.04 from 1.03 WBC 9.5 from 10.4; Hgb 11  Echo with bubble: LVEF [id number]; grade 1 diastolic dysfunction; normal atrial septum CXR: mild bibasilar atelectasis; small left pleural effusion  Resolved Hospital Problem list      Assessment & Plan:  Acute left MCA stroke(POA) with global aphasia and right-sided weakness, now status post revascularization in interventional radiology -Prior history of stroke P: -Followed by stroke team -serial neuro checks -SBP goal 120-140 -continue statin -DVT ppx: on eliquis -PT/OT/SLP  Acute respiratory failure secondary to ineffective airway clearance after large acute stroke: Improved with diuresis. Extubated 7/23.  P: -weaned to HFNC 8 l/m -wean O2 for sats >92% -pulm toilet: IS/flutter/CPT TID -OOB as tolerated  Atrial fibrillation with rapid ventricular response (present on admission at [facility]) P: -cards following -continue amio, diltiazem, digoxin, metoprolol PO -continue eliquis  R and L forearm Phlebitis: L arm iv with tracking and indurated area and R forearm with induration bruising and blistering. Suspect a medication we had been infusing, but none currently P: -minimize IV meds; remove IV's as able -apply warm compress and elevate affected area -B/L UE ultrasound ordered  History of GERD P: -PPI  History of hypothyroidism P: -continue Synthroid  History of hypertension P: -Holding home meds: valsartan /amlodipine -continue current medication for rate control  History of depression P: -consider adding home Wellbutrin when able to take po (XR cant go down tube)   Patient planning on moving to floors today; PCCM will sign off and available prn; consider consulting [facility] if further medical needs  Best Practice (right click and "Reselect all SmartList Selections" daily)   Diet/type: TF DVT prophylaxis: DOAC GI prophylaxis: PPI Lines: N/A Foley:  N/A Code Status:  full code Last date of multidisciplinary goals of care discussion [per primary ]  Labs   CBC: Recent Labs  Lab 12/03/20 0329 12/04/20 1301 12/04/20 2145 12/05/20 0635 12/06/20 0817 12/07/20 0211  WBC 10.7* 11.5* 12.8* 11.4* 10.4 9.5  NEUTROABS 8.9*  --   --   --    --   --   HGB 10.4* 11.0* 11.1* 10.6* 10.9* 11.0*  HCT 32.2* 36.1 34.3* 33.1* 34.9* 35.7*  MCV 93.9 96.8 93.5 93.8 94.6 95.2  PLT 229 PLATELET CLUMPS NOTED ON SMEAR, UNABLE TO ESTIMATE 247 222 272 295     Basic Metabolic Panel: Recent Labs  Lab 12/03/20 0242 12/03/20 0329 12/04/20 0930 12/04/20 2145 12/05/20 0635 12/06/20 0817 12/07/20 0211  NA  --    < > 141 140 141 141 139  K  --    < > 4.2 3.8 3.9 4.0 4.1  CL  --    < > 110 102 104 105 103  CO2  --    < > '25 29 28 30 28  '$ GLUCOSE  --    < > 156* 156* 121* 117* 102*  BUN  --    < > 24* 24* 32* 34* 36*  CREATININE  --    < > 0.92 1.04* 1.07* 1.03* 1.04*  CALCIUM  --    < > 8.3* 8.9 8.6* 9.2 9.5  MG 2.2  --   --   --  2.1  --   --    < > = values in this interval not displayed.    GFR: Estimated Creatinine Clearance: 69.5 mL/min (A) (by C-G formula based on SCr of 1.04 mg/dL (H)). Recent Labs  Lab  12/04/20 2145 12/05/20 0635 12/06/20 0817 12/07/20 0211  WBC 12.8* 11.4* 10.4 9.5     Liver Function Tests: Recent Labs  Lab 12/03/20 0329  AST 30  ALT 31  ALKPHOS 45  BILITOT 0.4  PROT 5.9*  ALBUMIN 3.0*    No results for input(s): LIPASE, AMYLASE in the last 168 hours. No results for input(s): AMMONIA in the last 168 hours.  ABG    Component Value Date/Time   PHART 7.294 (L) 12/02/2020 1934   PCO2ART 53.6 (H) 12/02/2020 1934   PO2ART 172 (H) 12/02/2020 1934   HCO3 26.3 12/02/2020 1934   TCO2 28 12/02/2020 1934   ACIDBASEDEF 1.0 12/02/2020 1934   O2SAT 99.0 12/02/2020 1934     Coagulation Profile: No results for input(s): INR, PROTIME in the last 168 hours.  Cardiac Enzymes: No results for input(s): CKTOTAL, CKMB, CKMBINDEX, TROPONINI in the last 168 hours.  HbA1C: Hgb A1c MFr Bld  Date/Time Value Ref Range Status  12/03/2020 03:30 AM 6.2 (H) 4.8 - 5.6 % Final    Comment:    (NOTE) Pre diabetes:          5.7%-6.4%  Diabetes:              >6.4%  Glycemic control for   <7.0% adults with  diabetes   06/15/2017 09:30 AM 6.0 (H) 4.8 - 5.6 % Final    Comment:             Prediabetes: 5.7 - 6.4          Diabetes: >6.4          Glycemic control for adults with diabetes: <7.0     CBG: Recent Labs  Lab 12/06/20 1153 12/06/20 1529 12/06/20 1938 12/06/20 2313 12/07/20 0319  GLUCAP 120* 103* 111* 109* 92     Review of Systems:   Not able   Past Medical History:  She,  has a past medical history of Arthritis, DVT (deep venous thrombosis) ([place]), Edema, GERD (gastroesophageal reflux disease), Hyperlipidemia, Hypertension, Hypothyroidism, and Varicose veins.   Surgical History:   Past Surgical History:  Procedure Laterality Date   CHOLECYSTECTOMY     Laparoscopic   CYSTOCELE REPAIR     HERNIA REPAIR     IR CT HEAD LTD  12/02/2020   IR PERCUTANEOUS ART THROMBECTOMY/INFUSION INTRACRANIAL INC DIAG ANGIO  12/02/2020   RADIOLOGY WITH ANESTHESIA N/A 12/02/2020   Procedure: IR WITH ANESTHESIA;  Surgeon: Luanne Bras, MD;  Location: [facility];  Service: Radiology;  Laterality: N/A;   TONSILLECTOMY     TOTAL KNEE ARTHROPLASTY Left 08/26/2013   Procedure: LEFT TOTAL KNEE REVISION;  Surgeon: Mauri Pole, MD;  Location: [facility];  Service: Orthopedics;  Laterality: Left;   VARICOSE VEIN SURGERY       Social History:   reports that she quit smoking about 10 years ago. Her smoking use included cigarettes. She has never used smokeless tobacco. She reports that she does not drink alcohol and does not use drugs.   Family History:  Her family history includes Cancer in her father and mother; Congestive Heart Failure in her mother; Other in her father.   Allergies No Known Allergies   Home Medications  Prior to Admission medications   Medication Sig Start Date End Date Taking? Authorizing Provider  buPROPion (WELLBUTRIN XL) 150 MG 24 hr tablet Take 150 mg by mouth daily.    [provider]  clopidogrel (PLAVIX) 75 MG tablet Take 1 tablet (75 mg total)  by mouth daily.  08/07/17   Marcial Pacas, MD  levothyroxine (SYNTHROID, LEVOTHROID) 100 MCG tablet Take 100 mcg by mouth daily before breakfast.    [provider]  losartan-hydrochlorothiazide (HYZAAR) 100-25 MG per tablet Take 1 tablet by mouth every morning.    [provider]     Critical care time: 35 minutes   CRITICAL CARE  JD Rexene Agent [facility] 12/07/2020, 6:50 AM  Please see [email or web address] for pager details.  From 7A-7P if no response, please call [phone]. After hours, please call ELink [phone].

## 2020-12-07 NOTE — Progress Notes (Addendum)
STROKE TEAM PROGRESS NOTE   INTERVAL HISTORY Patient in bed NAD. No family at bedside. Still with cortrak but eating her dysphagia 2 diet. If patient continues to PO well can remove cortrak Wednesday when team available. Patient to transfer to tele floor today.   Vitals:   12/07/20 0400 12/07/20 0500 12/07/20 0600 12/07/20 0800  BP: 123/84 119/77 (!) 112/95   Pulse: 89 100 (!) 101 95  Resp: '18 15 17 '$ (!) 22  Temp: 99 F (37.2 C)   98.5 F (36.9 C)  TempSrc: Oral   Axillary  SpO2: 99% 100% 100% 95%  Weight:      Height:       CBC:  Recent Labs  Lab 12/03/20 0329 12/04/20 1301 12/06/20 0817 12/07/20 0211  WBC 10.7*   < > 10.4 9.5  NEUTROABS 8.9*  --   --   --   HGB 10.4*   < > 10.9* 11.0*  HCT 32.2*   < > 34.9* 35.7*  MCV 93.9   < > 94.6 95.2  PLT 229   < > 272 295   < > = values in this interval not displayed.   Basic Metabolic Panel:  Recent Labs  Lab 12/03/20 0242 12/03/20 0329 12/05/20 0635 12/06/20 0817 12/07/20 0211  NA  --    < > 141 141 139  K  --    < > 3.9 4.0 4.1  CL  --    < > 104 105 103  CO2  --    < > '28 30 28  '$ GLUCOSE  --    < > 121* 117* 102*  BUN  --    < > 32* 34* 36*  CREATININE  --    < > 1.07* 1.03* 1.04*  CALCIUM  --    < > 8.6* 9.2 9.5  MG 2.2  --  2.1  --   --    < > = values in this interval not displayed.   Lipid Panel:  Recent Labs  Lab 12/03/20 0330  CHOL 142  TRIG 107  100  HDL 40*  CHOLHDL 3.6  VLDL 21  LDLCALC 81   HgbA1c:  Recent Labs  Lab 12/03/20 0330  HGBA1C 6.2*   Urine Drug Screen:  Recent Labs  Lab 12/02/20 1840  LABOPIA NONE DETECTED  COCAINSCRNUR NONE DETECTED  LABBENZ NONE DETECTED  AMPHETMU NONE DETECTED  THCU NONE DETECTED  LABBARB NONE DETECTED    MR Brain WO Contrast 12/02/20 IMPRESSION: Small to moderate acute cortical infarct in the left frontal parietal cortex primarily in the postcentral gyrus. No associated hemorrhage.  PHYSICAL EXAM  Temp:  [97.6 F (36.4 C)-99 F (37.2 C)] 98.5  F (36.9 C) (07/26 0800) Pulse Rate:  [80-115] 95 (07/26 0800) Resp:  [14-28] 22 (07/26 0800) BP: (104-148)/(64-98) 112/95 (07/26 0600) SpO2:  [94 %-100 %] 95 % (07/26 0800)  General - Well nourished, well developed, not in acute distress.  Ophthalmologic - fundi not visualized due to noncooperation.  Cardiovascular - irregularly irregular heart rate and rhythm.  Neuro - awake, alert, eyes open, difficult answer questions due to expressive aphasia. Difficulty with spontaneous speech, but able to repeat simple sentence but not complex sentences, naming 2/4, following most simple commands with mild psychomotor slowing. No gaze palsy, tracking bilaterally, visual field full, PERRL. Mild right facial droop. Tongue midline. LUE 4/5 and RUE 3-/5 proximal and distal. Bilateral LEs 4/5, no drift. Sensation symmetrical bilaterally, left FTN intact although slow, gait not  tested.    ASSESSMENT/PLAN Ms. DEVANEE MAI is a 70 y.o. female with history of hypertension, hyperlipidemia, and prior TIA who presented as a code stroke from [facility] after being found with right sided weakness and aphasia earlier today while admitted for afib RVR. CTA revealed a left MCA LVO. tPA was not given as she was on eliquis. She was taken for thrombectomy.  Stroke - left MCA infarct due to left MCA occlusion s/p thrombectomy with  TICI2c, etiology likely due to newly diagnosed A. fib CT no acute finding CT head and neck left MCA occlusion S/p IR with TICI2c revascularization MRI 12/02/20 - Small to moderate acute cortical infarct in the left frontal parietal cortex primarily in the postcentral gyrus. No associated hemorrhage. Echocardiogram EF 45 to 50% LDL 81 HgbA1c 6.2 DVT prophylaxis Heparin IV Plavix PTA,now on eliquis Therapy recommendations - CIR Disposition: Pending  Afib RVR Improving  Cardiology on board - appreciate assistance On digoxin 0.125 mg daily Continue metoprolol to 25 mg 3 times  daily Increased cardizem to '60mg'$  Q6h Transition Heparin IV to eliquis  History of TIA 05/2017 right arm numbness and weakness lasting for minutes.  Follows with Dr. Evelena Leyden at [facility].  In 07/2017 EF 55 to 60%, carotid Doppler showed left ICA 40 to 59% stenosis.  MRI negative for acute stroke.  Hyperlipidemia No statin at home LDL 81, goal < 70 Start crestor '20mg'$  daily Continue statin on discharge  HTN BP on the low end On metoprolol and diltizam per cardiology BP monitoring  Dysphagia Passed swallow On dys2 and thin Will d/c TF and encourage po intake Speech on board  Other stroke risk factors Morbid obesity, BMI 46.27  Other acute issues Anemia of chronic disease, Hb 10.4 ->11.0->11.1->10.9 Leukocytosis WBC 10.7 ->11.5->12.8->10.4 AKI creatinine 1.17-> 0.92->1.04->1.03  Hospital day # [place], MSN, NP-C [facility] [phone]  ATTENDING NOTE: I reviewed above note and agree with the assessment and plan. Pt was seen and examined.   No family at the bedside. Pt reclining in bed for breakfast. Awake alert and orientated to age and place but not to time. Still has partial expressive aphasia with speech hesitation but able to have spontaneous speech with paraphasic errors. Able to name and repeat simple sentences. No gaze palsy or visual field deficit. Still has right facial droop and right UE weakness 3+/5 proximal and 3-/5 distal. BLEs symmetrical. Heart rate improved, still in afib but HR 90s-100s. Continue metoprolol, cardizem and flecainide as well as eliquis. On diet, encourage po intake. PT/OT recommend CIR.   For detailed assessment and plan, please refer to above as I have made changes wherever appropriate.   Rosalin Hawking, MD PhD Stroke Neurology 12/07/2020 2:43 PM  This patient is critically ill due to left MCA infarct s/p IR, afib RVR, dysphagia and at significant risk of neurological worsening, death form hemorrhagic conversion, recurrent  stroke, heart failure, aspiration and seizure. This patient's care requires constant monitoring of vital signs, hemodynamics, respiratory and cardiac monitoring, review of multiple databases, neurological assessment, discussion with family, other specialists and medical decision making of high complexity. I spent 35 minutes of neurocritical care time in the care of this patient.     To contact Stroke Continuity provider, please refer to [email or web address] After hours, contact General Neurology

## 2020-12-07 NOTE — Progress Notes (Signed)
Physical Therapy Treatment Patient Details Name: Candice Perez MRN: [id number] DOB: 1950/08/25 Today's Date: 12/07/2020    History of Present Illness The pt is a 70 yo female presenting to [facility] on 7/21 due to new onset afib with RVR, but required transfer to [facility] from [place] via EMS on 7/21 after acute onset aphasia and R sided weakness. The pt is now s/p revascularization of L MCA on 7/21. PMH includes: HTN, HLD, TIA, stoke, DVT, depression, and GERD.    PT Comments    Pt progressing towards all goals. Pt with no with significantly impaired sensation on R LE in addition to impaired proprioception affecting ability to ambulate however pt putting forth great effort during therapy. Worked with OT today to focus on standing ability while working with ADLs in stedy. R knee requiring support due to buckling but able to contract quad with verbal cues. Pt with improved R UE movement today as well. Continue to recommend CIR upon d/c for maximal functional recovery.    Follow Up Recommendations  CIR     Equipment Recommendations       Recommendations for Other Services Rehab consult     Precautions / Restrictions Precautions Precautions: Fall Precaution Comments: HHFNC, watch HR Restrictions Weight Bearing Restrictions: No    Mobility  Bed Mobility Overal bed mobility: Needs Assistance Bed Mobility: Supine to Sit     Supine to sit: Mod assist     General bed mobility comments: HOB elevated, pt able to bring bilat LEs off EOB without assist, pt pulled up with L UE on PT to bring self to EOB, modA to pivot hips around to get feet on the floor    Transfers Overall transfer level: Needs assistance Equipment used:  (stedy) Transfers: Sit to/from [facility] Sit to Stand: Mod assist;+2 physical assistance         General transfer comment: pt modAx2 to stand into stedy x 4 in stedy, assist at pelvis for weight shifting to midline, R lateral  lean  Ambulation/Gait             General Gait Details: worked on stepping in stedy with modA to weight shift L/R to step, pt able to do complete 4 steps before needing a rest   Stairs             Wheelchair Mobility    Modified Rankin (Stroke Patients Only) Modified Rankin (Stroke Patients Only) Pre-Morbid Rankin Score: No symptoms Modified Rankin: Severe disability     Balance Overall balance assessment: Needs assistance Sitting-balance support: Single extremity supported;Feet supported Sitting balance-Leahy Scale: Fair     Standing balance support: Bilateral upper extremity supported Standing balance-Leahy Scale: Poor Standing balance comment: dependent on physical assist                            Cognition Arousal/Alertness: Awake/alert Behavior During Therapy: WFL for tasks assessed/performed Overall Cognitive Status: Impaired/Different from baseline Area of Impairment: Problem solving                         Safety/Judgement: Decreased awareness of deficits (unaware of R sided impaired sensation)   Problem Solving: Difficulty sequencing;Requires verbal cues;Requires tactile cues        Exercises Other Exercises Other Exercises: worked on standing [place] in steady while pt worked with OT with ADLs and dynamic reaching. Pt requiring modA at R lateral trunk to prevent R  lateral side bending.    General Comments General comments (skin integrity, edema, etc.): VSS, Pt with HR in 110s      Pertinent Vitals/Pain Pain Assessment: Faces Faces Pain Scale: Hurts even more Pain Location: R UE with ROM especially wrist/fingers flex/ext Pain Descriptors / Indicators: Grimacing;Tightness Pain Intervention(s): Monitored during session    Home Living                      Prior Function            PT Goals (current goals can now be found in the care plan section) Acute Rehab PT Goals Patient Stated Goal: to go  home Progress towards PT goals: Progressing toward goals    Frequency    Min 4X/week      PT Plan Current plan remains appropriate    Co-evaluation PT/OT/SLP Co-Evaluation/Treatment: Yes Reason for Co-Treatment: Complexity of the patient's impairments (multi-system involvement)          AM-PAC PT "6 Clicks" Mobility   Outcome Measure  Help needed turning from your back to your side while in a flat bed without using bedrails?: A Little Help needed moving from lying on your back to sitting on the side of a flat bed without using bedrails?: A Little Help needed moving to and from a bed to a chair (including a wheelchair)?: A Lot Help needed standing up from a chair using your arms (e.g., wheelchair or bedside chair)?: A Lot Help needed to walk in hospital room?: Total Help needed climbing 3-5 steps with a railing? : Total 6 Click Score: 12    End of Session Equipment Utilized During Treatment: Oxygen Activity Tolerance: Patient tolerated treatment well Patient left: in chair;with call bell/phone within reach;with chair alarm set;with family/visitor present Nurse Communication: Mobility status PT Visit Diagnosis: Other abnormalities of gait and mobility (R26.89);Muscle weakness (generalized) (M62.81);Hemiplegia and hemiparesis Hemiplegia - Right/Left: Right Hemiplegia - dominant/non-dominant: Dominant Hemiplegia - caused by: Cerebral infarction     Time: 1215-1250 PT Time Calculation (min) (ACUTE ONLY): 35 min  Charges:  $Neuromuscular Re-education: 8-22 mins                     Kittie Plater, PT, DPT Acute Rehabilitation Services Pager #: [phone] Office #: [phone]    Berline Lopes 12/07/2020, 1:43 PM

## 2020-12-07 NOTE — Consult Note (Signed)
Ref: Candice Hipps, MD   Subjective:  VS stable. Awake. Eating with left hand with moderate difficulty. Atrial fibrillation continues.Now on Eliquis.  Objective:  Vital Signs in the last 24 hours: Temp:  [97.6 F (36.4 C)-99 F (37.2 C)] (P) 98.3 F (36.8 C) (07/26 1545) Pulse Rate:  [80-115] (P) 97 (07/26 1545) Cardiac Rhythm: Atrial fibrillation;Bundle branch block (07/26 1510) Resp:  [14-28] (P) 18 (07/26 1545) BP: (103-148)/(63-98) (P) 124/75 (07/26 1545) SpO2:  [94 %-100 %] (P) 100 % (07/26 1545)  Physical Exam: BP Readings from Last 1 Encounters:  12/07/20 (P) 124/75     Wt Readings from Last 1 Encounters:  12/06/20 132.9 kg    Weight change:  Body mass index is 45.89 kg/m. HEENT: [place]/AT, Eyes-Blue, Conjunctiva-Pink, Sclera-Non-icteric Neck: No JVD, No bruit, Trachea midline. Lungs:  Clear, Bilateral. Cardiac:  Irregular rhythm, normal S1 and S2, no S3. II/VI systolic murmur. Abdomen:  Soft, non-tender. BS present. Extremities:  No edema present. No cyanosis. No clubbing. CNS: AxOx3, Cranial nerves grossly intact, moves all 4 extremities.  Skin: Warm and dry.   Intake/Output from previous day: 07/25 0701 - 07/26 0700 In: 110 [I.V.:110] Out: 1300 [Urine:1300]    Lab Results: BMET    Component Value Date/Time   NA 139 12/07/2020 0211   NA 141 12/06/2020 0817   NA 141 12/05/2020 0635   NA 142 06/15/2017 0930   K 4.1 12/07/2020 0211   K 4.0 12/06/2020 0817   K 3.9 12/05/2020 0635   CL 103 12/07/2020 0211   CL 105 12/06/2020 0817   CL 104 12/05/2020 0635   CO2 28 12/07/2020 0211   CO2 30 12/06/2020 0817   CO2 28 12/05/2020 0635   GLUCOSE 102 (H) 12/07/2020 0211   GLUCOSE 117 (H) 12/06/2020 0817   GLUCOSE 121 (H) 12/05/2020 0635   BUN 36 (H) 12/07/2020 0211   BUN 34 (H) 12/06/2020 0817   BUN 32 (H) 12/05/2020 0635   BUN 19 06/15/2017 0930   CREATININE 1.04 (H) 12/07/2020 0211   CREATININE 1.03 (H) 12/06/2020 0817   CREATININE 1.07 (H) 12/05/2020  0635   CALCIUM 9.5 12/07/2020 0211   CALCIUM 9.2 12/06/2020 0817   CALCIUM 8.6 (L) 12/05/2020 0635   GFRNONAA 57 (L) 12/07/2020 0211   GFRNONAA 58 (L) 12/06/2020 0817   GFRNONAA 55 (L) 12/05/2020 0635   GFRAA 68 06/15/2017 0930   GFRAA 64 (L) 08/28/2013 0429   GFRAA 77 (L) 08/27/2013 0456   CBC    Component Value Date/Time   WBC 9.5 12/07/2020 0211   RBC 3.75 (L) 12/07/2020 0211   HGB 11.0 (L) 12/07/2020 0211   HGB 12.9 06/15/2017 0930   HCT 35.7 (L) 12/07/2020 0211   HCT 38.6 06/15/2017 0930   PLT 295 12/07/2020 0211   PLT 265 06/15/2017 0930   MCV 95.2 12/07/2020 0211   MCV 86 06/15/2017 0930   MCH 29.3 12/07/2020 0211   MCHC 30.8 12/07/2020 0211   RDW 13.5 12/07/2020 0211   RDW 14.5 06/15/2017 0930   LYMPHSABS 1.0 12/03/2020 0329   MONOABS 0.7 12/03/2020 0329   EOSABS 0.0 12/03/2020 0329   BASOSABS 0.0 12/03/2020 0329   HEPATIC Function Panel Recent Labs    12/03/20 0329  PROT 5.9*   HEMOGLOBIN A1C No components found for: HGA1C,  MPG CARDIAC ENZYMES No results found for: CKTOTAL, CKMB, CKMBINDEX, TROPONINI BNP No results for input(s): PROBNP in the last 8760 hours. TSH No results for input(s): TSH in the last  8760 hours. CHOLESTEROL Recent Labs    12/03/20 0330  CHOL 142    Scheduled Meds:   stroke: mapping our early stages of recovery book   Does not apply Once   amiodarone  200 mg Per Tube Daily   apixaban  5 mg Oral BID   chlorhexidine gluconate (MEDLINE KIT)  15 mL Mouth Rinse BID   Chlorhexidine Gluconate Cloth  6 each Topical Daily   digoxin  0.125 mg Oral Daily   diltiazem  60 mg Per Tube Q6H   feeding supplement  237 mL Oral BID BM   insulin aspart  0-15 Units Subcutaneous Q4H   levothyroxine  100 mcg Per Tube Q0600   metoprolol tartrate  25 mg Per Tube TID   mupirocin ointment  1 application Nasal BID   rosuvastatin  20 mg Per Tube Daily   Continuous Infusions: PRN Meds:.acetaminophen **OR** acetaminophen (TYLENOL) oral liquid 160  mg/5 mL **OR** acetaminophen, levalbuterol, senna-docusate  Assessment/Plan:  Acute stroke Atrial fibrillation Acute respiratory failure with hypoxia CKD, II Type 2 DM Morbid obesity  Plan:  Continue medications. Increase activity.   LOS: 5 days   Time spent including chart review, lab review, examination, discussion with patient : 30 min   Dixie Dials  MD  12/07/2020, 4:17 PM

## 2020-12-07 NOTE — PMR Pre-admission (Signed)
PMR Admission Coordinator Pre-Admission Assessment  Patient: Candice Perez is an 70 y.o., female MRN: [id number] DOB: [birth year] Height: _0  (170.2 cm) Weight: 132.5 kg  Insurance Information HMO:    PPO:  yes    PCP:      IPA:      80/20:      OTHER:  PRIMARY: [employer] Medicare      Policy#: [id number]      Subscriber: Patient CM Name: Marcene Brawn      Phone#: [phone]     Fax#: [phone] I received a call from [facility] at [facility] and she stated pt. Was authorizes for CIR admit from 7/27-8/2 with clinical updates due 8/2 Pre-Cert#: [id number]      Employer: Benefits:  Phone #:ax: [phone] Phone: [phone] Eff Date: 05/15/2020 - still active Deductible: does not have one OOP Max: $4,500 ($25 met) CIR: $295/day co-pay with a max co-pay of $1,770/admission (6 days) SNF: $0/day co-pay for days 1-20, $188/day co-pay for days 21-100; limited to 100 days/cal yr. Outpatient:  $35/visit co-pay Home Health:  100% coverage DME: 80% coverage; 20% co-insurance Providers: in-network  SECONDARY:       Policy#:      Phone#:   Information systems manager" for patients in Inpatient Rehabilitation Facilities with attached "Privacy Act [place] Records" was provided and verbally reviewed with: Patient  Emergency Contact Information Contact Information     Name Relation Home Work Mobile   Robinette,Howard Spouse [phone]  [phone]   Mamye, Bolds   [phone]       Current Medical History  Patient Admitting Diagnosis: Left MCA stroke History of Present Illness: Pt is a 70 year old female with medical hx significant for: HTN, depression, GERD, DVT, stroke, TIA, HLD. Pt presented to [facility] on 12/02/20 d/t new onset of A-fib with RVR. Shortly after arrival, pt developed acute onset aphasia and right-sided weakness. Code stroke activated. CT scan revealed area concerning left MCA with large volume occlusion. tPA was not  administered. Pt transferred to [facility] for ongoing treatment.  After arrival, pt underwent revascularization of left MCA with clot extraction.  Complete NIHSS TOTAL: 9  Patient's medical record from [facility] has been reviewed by the rehabilitation admission coordinator and physician.  Past Medical History  Past Medical History:  Diagnosis Date   Arthritis    DVT (deep venous thrombosis) (HCC)    Edema    GERD (gastroesophageal reflux disease)    OCCASIONAL - PRILOSEC IF NEEDED   Hyperlipidemia    Hypertension    no meds now   Hypothyroidism    Varicose veins     Family History   family history includes Cancer in her father and mother; Congestive Heart Failure in her mother; Other in her father.  Prior Rehab/Hospitalizations Has the patient had prior rehab or hospitalizations prior to admission? No  Has the patient had major surgery during 100 days prior to admission? Yes   Current Medications  Current Facility-Administered Medications:     stroke: mapping our early stages of recovery book, , Does not apply, Once, Vonzella Nipple, NP   acetaminophen (TYLENOL) tablet 650 mg, 650 mg, Oral, Q4H PRN, 650 mg at 12/08/20 1333 **OR** acetaminophen (TYLENOL) 160 MG/5ML solution 650 mg, 650 mg, Per Tube, Q4H PRN, 650 mg at 12/06/20 2014 **OR** acetaminophen (TYLENOL) suppository 650 mg, 650 mg, Rectal, Q4H PRN, Vonzella Nipple, NP   amiodarone (PACERONE) tablet 200 mg, 200 mg, Oral, Daily, Beldon,  Consuello Masse, RPH, 200 mg at 12/09/20 0849   apixaban (ELIQUIS) tablet 5 mg, 5 mg, Oral, BID, Williams, Jessica N, NP, 5 mg at 12/09/20 0850   chlorhexidine gluconate (MEDLINE KIT) (PERIDEX) 0.12 % solution 15 mL, 15 mL, Mouth Rinse, BID, Williams, Jessica N, NP, 15 mL at 12/09/20 0850   Chlorhexidine Gluconate Cloth 2 % PADS 6 each, 6 each, Topical, Daily, Vonzella Nipple, NP, 6 each at 12/09/20 0850   digoxin (LANOXIN) tablet 0.125 mg, 0.125 mg, Oral, Daily,  Williams, Jessica N, NP, 0.125 mg at 12/09/20 0850   diltiazem (CARDIZEM CD) 24 hr capsule 240 mg, 240 mg, Oral, Daily, Dixie Dials, MD, 240 mg at 12/09/20 0849   feeding supplement (ENSURE ENLIVE / ENSURE PLUS) liquid 237 mL, 237 mL, Oral, BID BM, Williams, Jessica N, NP, 237 mL at 12/09/20 0852   insulin aspart (novoLOG) injection 0-15 Units, 0-15 Units, Subcutaneous, Q4H, Vonzella Nipple, NP, 3 Units at 12/09/20 0427   levalbuterol (XOPENEX) nebulizer solution 0.63 mg, 0.63 mg, Nebulization, Q6H PRN, Vonzella Nipple, NP   levothyroxine (SYNTHROID) tablet 100 mcg, 100 mcg, Oral, Q0600, Beldon, Consuello Masse, RPH, 100 mcg at 12/09/20 0631   metoprolol tartrate (LOPRESSOR) tablet 25 mg, 25 mg, Oral, TID, Beldon, Brianna S, RPH, 25 mg at 12/09/20 0850   rosuvastatin (CRESTOR) tablet 20 mg, 20 mg, Oral, Daily, Beldon, Brianna S, RPH, 20 mg at 12/09/20 0849   senna-docusate (Senokot-S) tablet 1 tablet, 1 tablet, Oral, QHS PRN, Vonzella Nipple, NP  Patients Current Diet:  Diet Order             DIET DYS 2 Room service appropriate? Yes; Fluid consistency: Thin  Diet effective now                   Precautions / Restrictions Precautions Precautions: Fall Precaution Comments: HHFNC, watch HR Restrictions Weight Bearing Restrictions: No   Has the patient had 2 or more falls or a fall with injury in the past year? No  Prior Activity Level Limited Community (1-2x/wk): drives, gets out of house 2-3 days/week  Prior Functional Level Self Care: Did the patient need help bathing, dressing, using the toilet or eating? Independent  Indoor Mobility: Did the patient need assistance with walking from room to room (with or without device)? Independent  Stairs: Did the patient need assistance with internal or external stairs (with or without device)? Independent  Functional Cognition: Did the patient need help planning regular tasks such as shopping or remembering to take medications?  Independent  Home Assistive Devices / Equipment Home Assistive Devices/Equipment: None Home Equipment: Crutches, Hand held shower head  Prior Device Use: Indicate devices/aids used by the patient prior to current illness, exacerbation or injury? None of the above  Current Functional Level Cognition  Arousal/Alertness: Awake/alert Overall Cognitive Status: Impaired/Different from baseline Orientation Level: Oriented to person, Oriented to place Following Commands: Follows multi-step commands with increased time, Follows one step commands consistently Safety/Judgement: Decreased awareness of deficits General Comments: pt able to follow commands consistently, aware of deficits including speech recommendations, incresaed time to process Attention: Sustained Sustained Attention: Appears intact (suspect deficits with alterating and divided attention) Memory:  (will assess further) Awareness: Impaired Awareness Impairment: Emergent impairment, Anticipatory impairment Problem Solving: Appears intact (for basic) Safety/Judgment: Other (comment) (prognosis good)    Extremity Assessment (includes Sensation/Coordination)  Upper Extremity Assessment: RUE deficits/detail RUE Deficits / Details: noted to have decrease digit activation and decreased wrist extension. pt activation  at elbow shoulder scapula. pt with IV at the wrist and blistered rash area on forearm that could prevent any splinting for night attempts at this time RUE Sensation: decreased light touch RUE Coordination: decreased fine motor, decreased gross motor  Lower Extremity Assessment: Defer to PT evaluation    ADLs  Overall ADL's : Needs assistance/impaired Eating/Feeding: NPO Grooming: Oral care, Moderate assistance, Standing Grooming Details (indicate cue type and reason): needs (A) to apply paste and completes task with decreased time than needed. pt does completed sequence correctly Lower Body Bathing: Maximal  assistance General ADL Comments: pt with purewick in place due to help with incontinence. pt output of > 300 CC    Mobility  Overal bed mobility: Modified Independent Bed Mobility: Supine to Sit Rolling: Max assist, +2 for physical assistance Supine to sit: Modified independent (Device/Increase time) General bed mobility comments: Patient was able to get herself seated at edge of bed using bed rails with supervision. increased time and effort to perform.    Transfers  Overall transfer level: Needs assistance Equipment used: Hemi-walker Transfer via Lift Equipment: Stedy Transfers: Sit to/from Stand Sit to Stand: Min assist, +2 physical assistance Stand pivot transfers: Mod assist General transfer comment: min assist +2 for sit to stand transfers from bed and recliner. Cues initially to come forward    Ambulation / Gait / Stairs / Wheelchair Mobility  Ambulation/Gait Ambulation/Gait assistance: Mod assist, +2 physical assistance, +2 safety/equipment Gait Distance (Feet): 4 Feet Assistive device: Hemi-walker Gait Pattern/deviations: Step-to pattern, Decreased step length - right, Decreased step length - left, Trunk flexed, Shuffle General Gait Details: able to take a few steps from bed to recliner, mod leaning to right with weight shifting. Cues needed throughout for sequencing. Gait velocity: decr    Posture / Balance Balance Overall balance assessment: Needs assistance Sitting-balance support: Feet supported Sitting balance-Leahy Scale: Good Standing balance support: Single extremity supported, During functional activity Standing balance-Leahy Scale: Fair Standing balance comment: Patient demonstrated no knee buckling this session. Dependent on signle UE support and mod assist    Special needs/care consideration Skin Right groin puncture wound   Previous Home Environment (from acute therapy documentation) Living Arrangements: Spouse/significant other  Lives With:  Spouse Available Help at Discharge: Family, Available 24 hours/day Type of Home: House Home Layout: One level Home Access: Level entry Bathroom Shower/Tub: Gaffer, Chiropodist: Handicapped height Bathroom Accessibility: Yes How Accessible: Accessible via walker Bryant: No Additional Comments: no pets  Discharge Living Setting Plans for Discharge Living Setting: Patient's home Type of Home at Discharge: House Discharge Home Layout: One level Discharge Home Access: Level entry Discharge Bathroom Shower/Tub: Walk-in shower, Cedar unit Discharge Bathroom Toilet: Handicapped height Discharge Bathroom Accessibility: Yes How Accessible: Accessible via walker Does the patient have any problems obtaining your medications?: No  Social/Family/Support Systems Anticipated Caregiver: Arlyce Dice, husband and son Anticipated Caregiver's Contact Information: [phone] Caregiver Availability: 24/7 Discharge Plan Discussed with Primary Caregiver: Yes Is Caregiver In Agreement with Plan?: Yes Does Caregiver/Family have Issues with Lodging/Transportation while Pt is in Rehab?: No  Goals Patient/Family Goal for Rehab: PT/OT/SLP Supervision Expected length of stay: 14-16 days Pt/Family Agrees to Admission and willing to participate: Yes Program Orientation Provided & Reviewed with Pt/Caregiver Including Roles  & Responsibilities: Yes  Decrease burden of Care through IP rehab admission: Specialzed equipment needs, Decrease number of caregivers, Bowel and bladder program, and Patient/family education  Possible need for SNF placement upon discharge: not anticipated  Patient Condition: I have reviewed medical records from [facility], spoken with CM, and patient and family member. I met with patient at the bedside for inpatient rehabilitation assessment.  Patient will benefit from ongoing PT, OT, and SLP, can actively participate in  3 hours of therapy a day 5 days of the week, and can make measurable gains during the admission.  Patient will also benefit from the coordinated team approach during an Inpatient Acute Rehabilitation admission.  The patient will receive intensive therapy as well as Rehabilitation physician, nursing, social worker, and care management interventions.  Due to safety, skin/wound care, disease management, medication administration, pain management, and patient education the patient requires 24 hour a day rehabilitation nursing.  The patient is currently mod assist +2 to max assist with mobility and basic ADLs.  Discharge setting and therapy post discharge at home with home health is anticipated.  Patient has agreed to participate in the Acute Inpatient Rehabilitation Program and will admit today.  Preadmission Screen Completed By:  Retta Diones, 12/09/2020 11:13 AM ______________________________________________________________________   Discussed status with Dr.  Dagoberto Ligas on  12/09/20 at 0930 and received approval for admission today.  Admission Coordinator:  Retta Diones, RN, time 1105/Date 12/09/20 and updated by Karene Fry, RN at 1105 on 12/09/20   Assessment/Plan: Diagnosis: Does the need for close, 24 hr/day Medical supervision in concert with the patient's rehab needs make it unreasonable for this patient to be served in a less intensive setting? Yes Co-Morbidities requiring supervision/potential complications: new Afib with RVR, L MCA stroke s/p thrombectomy; hx of DVT, HTN, GERD, depression, prior TIA/CVA; HLD Due to bladder management, bowel management, safety, skin/wound care, disease management, medication administration, and patient education, does the patient require 24 hr/day rehab nursing? Yes Does the patient require coordinated care of a physician, rehab nurse, PT, OT, and SLP to address physical and functional deficits in the context of the above medical diagnosis(es)? Yes Addressing  deficits in the following areas: balance, endurance, locomotion, strength, transferring, bowel/bladder control, bathing, dressing, feeding, grooming, toileting, cognition, speech, language, and swallowing Can the patient actively participate in an intensive therapy program of at least 3 hrs of therapy 5 days a week? Yes The potential for patient to make measurable gains while on inpatient rehab is good Anticipated functional outcomes upon discharge from inpatient rehab: supervision and min assist PT, supervision and min assist OT, supervision and min assist SLP Estimated rehab length of stay to reach the above functional goals is: 14-16 days Anticipated discharge destination: Home 10. Overall Rehab/Functional Prognosis: good   MD Signature:

## 2020-12-07 NOTE — Progress Notes (Signed)
  Speech Language Pathology Treatment: Dysphagia;Cognitive-Linquistic  Patient Details Name: Candice Perez MRN: [id number] DOB: 05/01/1951 Today's Date: 12/07/2020 Time: [id number] SLP Time Calculation (min) (ACUTE ONLY): 31 min  Assessment / Plan / Recommendation Clinical Impression  Aphasia/apraxia/dysarthria/dysphagia treatment and education with pt and her sister and brother-in-law at bedside. Observed with trial of regular texture solid, thin liquids following breakfast. Mild residue removed with hand over hand use of toothette in right buccal cavity. Her lingual sweep on right is incomplete although she attempts to remove. Left toothettes at bedside and educated her to use at end of meals. Continue Dys 2, thin at this time.  Phonemic sound substitutions and distortions. Provided verbal/visual cues for over pronunciation of articulators to increase intelligibility. Pt not aware of phonemic errors most of the time and educated to increase her self monitoring and attempt to correct if able. Having difficulty relaying medications to sister verbally and provided her paper and pen already in room to assist. Great rehab candidate when able to wean from HFNC.   HPI HPI: The pt is a 70 yo female presenting to [facility] on 7/21 due to new onset afib with RVR, but required transfer to [facility] from [place] via EMS on 7/21 after acute onset aphasia and R sided weakness. MRI small to moderate acute cortical infarct in the left frontal parietal cortex primarily in the postcentral gyrus She is s/p revascularization of L MCA on 7/21. PMH includes: HTN, HLD, TIA, stoke, DVT, depression, and GERD.      SLP Plan  Continue with current plan of care       Recommendations  Diet recommendations: Dysphagia 2 (fine chop);Thin liquid Liquids provided via: Cup;Straw Medication Administration: Whole meds with puree Supervision: Staff to assist with self feeding;Full supervision/cueing for compensatory  strategies Compensations: Slow rate;Small sips/bites;Lingual sweep for clearance of pocketing;Monitor for anterior loss Postural Changes and/or Swallow Maneuvers: Seated upright 90 degrees                General recommendations: Rehab consult Oral Care Recommendations: Oral care BID Follow up Recommendations: Inpatient Rehab SLP Visit Diagnosis: Aphasia (R47.01);Apraxia (R48.2);Dysphagia, unspecified (R13.10);Cognitive communication deficit [id number]) Plan: Continue with current plan of care                       Houston Siren 12/07/2020, 12:02 PM  [name] Colvin Caroli.Ed Risk analyst [phone] Office [phone]

## 2020-12-07 NOTE — Progress Notes (Signed)
Occupational Therapy Treatment Patient Details Name: Candice Perez MRN: [id number] DOB: [birth year] Today's Date: 12/07/2020    History of present illness The pt is a 70 yo female presenting to [facility] on 7/21 due to new onset afib with RVR, but required transfer to [facility] from [place] via EMS on 7/21 after acute onset aphasia and R sided weakness. The pt is now s/p revascularization of L MCA on 7/21. PMH includes: HTN, HLD, TIA, stoke, DVT, depression, and GERD.   OT comments  Pt progressed to OOB in stedy this session with static standing for adl task and R UE activation. Pt with sensation changes to R UE / LE that affects proprioception. Pt will benefit from hoyer lift back with RN staff or use of stedy with two person (A). Recommendation CIR  Follow Up Recommendations  CIR    Equipment Recommendations  Hospital bed;Wheelchair cushion (measurements OT);Wheelchair (measurements OT)    Recommendations for Other Services Rehab consult;Speech consult    Precautions / Restrictions Precautions Precautions: Fall Precaution Comments: HHFNC, watch HR Restrictions Weight Bearing Restrictions: No       Mobility Bed Mobility Overal bed mobility: Needs Assistance Bed Mobility: Supine to Sit     Supine to sit: Mod assist     General bed mobility comments: HOB elevated, pt able to bring bilat LEs off EOB without assist, pt pulled up with L UE on PT to bring self to EOB, modA to pivot hips around to get feet on the floor    Transfers Overall transfer level: Needs assistance Equipment used:  (stedy) Transfers: Sit to/from [facility] Sit to Stand: Mod assist;+2 physical assistance         General transfer comment: pt modAx2 to stand into stedy x 4 in stedy, assist at pelvis for weight shifting to midline, R lateral lean    Balance Overall balance assessment: Needs assistance Sitting-balance support: Single extremity supported;Feet supported Sitting  balance-Leahy Scale: Fair     Standing balance support: Bilateral upper extremity supported Standing balance-Leahy Scale: Poor Standing balance comment: dependent on physical assist                           ADL either performed or assessed with clinical judgement   ADL Overall ADL's : Needs assistance/impaired     Grooming: Oral care;Moderate assistance;Standing Grooming Details (indicate cue type and reason): needs (A) to apply paste and completes task with decreased time than needed. pt does completed sequence correctly     Lower Body Bathing: Maximal assistance                         General ADL Comments: pt with purewick in place due to help with incontinence. pt output of > 300 CC     Vision       Perception     Praxis      Cognition Arousal/Alertness: Awake/alert Behavior During Therapy: WFL for tasks assessed/performed Overall Cognitive Status: Impaired/Different from baseline Area of Impairment: Problem solving                         Safety/Judgement: Decreased awareness of deficits (unaware of R sided impaired sensation)   Problem Solving: Difficulty sequencing;Requires verbal cues;Requires tactile cues          Exercises Exercises: Other exercises Other Exercises Other Exercises: worked on standing [place] in steady while pt worked with  OT with ADLs and dynamic reaching. Pt requiring modA at R lateral trunk to prevent R lateral side bending. Other Exercises: Scapula retraction depression activation reps Other Exercises: reaching with R UE with thumb up and wrist supported in neutral 90 degrees x10 reps   Shoulder Instructions       General Comments VSS HR 110    Pertinent Vitals/ Pain       Pain Assessment: Faces Faces Pain Scale: Hurts even more Pain Location: R UE with ROM especially wrist/fingers flex/ext Pain Descriptors / Indicators: Grimacing;Tightness Pain Intervention(s): Monitored during  session;Repositioned  Home Living                                          Prior Functioning/Environment              Frequency  Min 2X/week        Progress Toward Goals  OT Goals(current goals can now be found in the care plan section)  Progress towards OT goals: Progressing toward goals  Acute Rehab OT Goals Patient Stated Goal: to have a little fun OT Goal Formulation: With patient (sister and brother in law present) Time For Goal Achievement: 12/18/20 Potential to Achieve Goals: Good  Plan Discharge plan remains appropriate    Co-evaluation    PT/OT/SLP Co-Evaluation/Treatment: Yes Reason for Co-Treatment: Complexity of the patient's impairments (multi-system involvement);For patient/therapist safety;To address functional/ADL transfers   OT goals addressed during session: ADL's and self-care;Proper use of Adaptive equipment and DME;Strengthening/ROM      AM-PAC OT "6 Clicks" Daily Activity     Outcome Measure   Help from another person eating meals?: A Little Help from another person taking care of personal grooming?: A Lot Help from another person toileting, which includes using toliet, bedpan, or urinal?: Total Help from another person bathing (including washing, rinsing, drying)?: Total Help from another person to put on and taking off regular upper body clothing?: A Lot Help from another person to put on and taking off regular lower body clothing?: Total 6 Click Score: 10    End of Session Equipment Utilized During Treatment: Oxygen  OT Visit Diagnosis: Unsteadiness on feet (R26.81);Muscle weakness (generalized) (M62.81);Hemiplegia and hemiparesis Hemiplegia - Right/Left: Right Hemiplegia - dominant/non-dominant: Dominant Hemiplegia - caused by: Cerebral infarction   Activity Tolerance Patient tolerated treatment well   Patient Left in chair;with call bell/phone within reach;with chair alarm set   Nurse Communication Mobility  status;Precautions        Time: [id number] OT Time Calculation (min): 34 min  Charges: OT General Charges $OT Visit: 1 Visit OT Treatments $Self Care/Home Management : 8-22 mins   Candice Perez, OTR/L  Acute Rehabilitation Services Pager: [phone] Office: [phone] .    Jeri Modena 12/07/2020, 2:58 PM

## 2020-12-08 ENCOUNTER — Inpatient Hospital Stay (HOSPITAL_COMMUNITY): Payer: Medicare HMO

## 2020-12-08 DIAGNOSIS — I4891 Unspecified atrial fibrillation: Secondary | ICD-10-CM | POA: Diagnosis not present

## 2020-12-08 DIAGNOSIS — I6602 Occlusion and stenosis of left middle cerebral artery: Secondary | ICD-10-CM | POA: Diagnosis not present

## 2020-12-08 DIAGNOSIS — R1312 Dysphagia, oropharyngeal phase: Secondary | ICD-10-CM | POA: Diagnosis not present

## 2020-12-08 DIAGNOSIS — I809 Phlebitis and thrombophlebitis of unspecified site: Secondary | ICD-10-CM | POA: Diagnosis not present

## 2020-12-08 DIAGNOSIS — I63412 Cerebral infarction due to embolism of left middle cerebral artery: Secondary | ICD-10-CM | POA: Diagnosis not present

## 2020-12-08 LAB — BASIC METABOLIC PANEL
Anion gap: 8 (ref 5–15)
BUN: 35 mg/dL — ABNORMAL HIGH (ref 8–23)
CO2: 28 mmol/L (ref 22–32)
Calcium: 9.5 mg/dL (ref 8.9–10.3)
Chloride: 103 mmol/L (ref 98–111)
Creatinine, Ser: 1.06 mg/dL — ABNORMAL HIGH (ref 0.44–1.00)
GFR, Estimated: 56 mL/min — ABNORMAL LOW (ref >60)
Glucose, Bld: 109 mg/dL — ABNORMAL HIGH (ref 70–99)
Potassium: 4.3 mmol/L (ref 3.5–5.1)
Sodium: 139 mmol/L (ref 135–145)

## 2020-12-08 LAB — CBC
HCT: 34.6 % — ABNORMAL LOW (ref 36.0–46.0)
Hemoglobin: 10.8 g/dL — ABNORMAL LOW (ref 12.0–15.0)
MCH: 29.1 pg (ref 26.0–34.0)
MCHC: 31.2 g/dL (ref 30.0–36.0)
MCV: 93.3 fL (ref 80.0–100.0)
Platelets: 294 10*3/uL (ref 150–400)
RBC: 3.71 MIL/uL — ABNORMAL LOW (ref 3.87–5.11)
RDW: 13.2 % (ref 11.5–15.5)
WBC: 8.4 10*3/uL (ref 4.0–10.5)
nRBC: 0 % (ref 0.0–0.2)

## 2020-12-08 LAB — GLUCOSE, CAPILLARY
Glucose-Capillary: 104 mg/dL — ABNORMAL HIGH (ref 70–99)
Glucose-Capillary: 145 mg/dL — ABNORMAL HIGH (ref 70–99)
Glucose-Capillary: 98 mg/dL (ref 70–99)
Glucose-Capillary: 97 mg/dL (ref 70–99)
Glucose-Capillary: 149 mg/dL — ABNORMAL HIGH (ref 70–99)

## 2020-12-08 MED ORDER — METOPROLOL TARTRATE 25 MG PO TABS
25.0000 mg | ORAL_TABLET | Freq: Three times a day (TID) | ORAL | Status: DC
  Administered 2020-12-08 – 2020-12-09 (×3): 25 mg via ORAL
  Filled 2020-12-08 (×3): qty 1

## 2020-12-08 MED ORDER — AMIODARONE HCL 200 MG PO TABS
200.0000 mg | ORAL_TABLET | Freq: Every day | ORAL | Status: DC
  Administered 2020-12-09: 200 mg via ORAL
  Filled 2020-12-08: qty 1

## 2020-12-08 MED ORDER — DILTIAZEM HCL ER COATED BEADS 240 MG PO CP24
240.0000 mg | ORAL_CAPSULE | Freq: Every day | ORAL | Status: DC
Start: 2020-12-08 — End: 2020-12-09
  Administered 2020-12-08 – 2020-12-09 (×2): 240 mg via ORAL
  Filled 2020-12-08 (×2): qty 1

## 2020-12-08 MED ORDER — ROSUVASTATIN CALCIUM 20 MG PO TABS
20.0000 mg | ORAL_TABLET | Freq: Every day | ORAL | Status: DC
  Administered 2020-12-09: 20 mg via ORAL
  Filled 2020-12-08: qty 1

## 2020-12-08 MED ORDER — LEVOTHYROXINE SODIUM 100 MCG PO TABS
100.0000 ug | ORAL_TABLET | Freq: Every day | ORAL | Status: DC
  Administered 2020-12-09: 100 ug via ORAL
  Filled 2020-12-08: qty 1

## 2020-12-08 NOTE — Discharge Summary (Addendum)
Patient ID: Candice Perez   MRN: [id number]      DOB: December 17, 1950  Date of Admission: 12/02/2020 Date of Discharge: 12/08/2020  Attending Physician:  Stroke, Md, MD, Stroke MD Consultant(s):   Treatment Team:  Dixie Dials, MD cardiology  ;  Kipp Brood, MD Critical Care Medicine ; Luanne Bras, MD Neuro Interventional Radiology    Patient's PCP:  Ronita Hipps, MD  DISCHARGE DIAGNOSIS:  Stroke - left MCA infarct due to left MCA occlusion s/p thrombectomy with  TICI2c, etiology likely due to newly diagnosed A. Fib Acute respiratory failure with hypoxia (HCC)  Nutrition Problem: Inadequate oral intake. Etiology: dysphagia Anemia of chronic disease Mild Leukocytosis (afebrile) AKI  Morbid Obesity DVT history TIA history Hyperlipidemia Atrial fibrillation with RVR Cardiomyopathy EF 40 - 50% with global hypokinesis. Hx of depression  Past Medical History:  Diagnosis Date   Arthritis    DVT (deep venous thrombosis) (HCC)    Edema    GERD (gastroesophageal reflux disease)    OCCASIONAL - PRILOSEC IF NEEDED   Hyperlipidemia    Hypertension    no meds now   Hypothyroidism    Varicose veins    Past Surgical History:  Procedure Laterality Date   CHOLECYSTECTOMY     Laparoscopic   CYSTOCELE REPAIR     HERNIA REPAIR     IR CT HEAD LTD  12/02/2020   IR PERCUTANEOUS ART THROMBECTOMY/INFUSION INTRACRANIAL INC DIAG ANGIO  12/02/2020   RADIOLOGY WITH ANESTHESIA N/A 12/02/2020   Procedure: IR WITH ANESTHESIA;  Surgeon: Luanne Bras, MD;  Location: [place];  Service: Radiology;  Laterality: N/A;   TONSILLECTOMY     TOTAL KNEE ARTHROPLASTY Left 08/26/2013   Procedure: LEFT TOTAL KNEE REVISION;  Surgeon: Mauri Pole, MD;  Location: [facility];  Service: Orthopedics;  Laterality: Left;   VARICOSE VEIN SURGERY      Family History Family History  Problem Relation Age of Onset   Congestive Heart Failure Mother    Cancer Mother        tongue/sinus   Cancer Father         "in his back"   Other Father        Varicose veins, Arteriosclerosis    Social History  reports that she quit smoking about 10 years ago. Her smoking use included cigarettes. She has never used smokeless tobacco. She reports that she does not drink alcohol and does not use drugs.  Allergies as of 12/09/2020   No Known Allergies      Medication List     STOP taking these medications    amLODipine-valsartan 5-320 MG tablet Commonly known as: EXFORGE   ascorbic acid 250 MG Chew Commonly known as: VITAMIN C   buPROPion 150 MG 24 hr tablet Commonly known as: WELLBUTRIN XL   clopidogrel 75 MG tablet Commonly known as: PLAVIX   Co Q-10 100 MG Caps   HAIR/SKIN/NAILS PO   pyridOXINE 100 MG tablet Commonly known as: VITAMIN B-6   Vitamin D3 125 MCG (5000 UT) Caps   zinc gluconate 50 MG tablet       TAKE these medications    acetaminophen 325 MG tablet Commonly known as: TYLENOL Take 2 tablets (650 mg total) by mouth every 4 (four) hours as needed for mild pain (or temp > 37.5 C (99.5 F)).   amiodarone 200 MG tablet Commonly known as: PACERONE Take 1 tablet (200 mg total) by mouth daily. Start taking on: December 10, 2020   apixaban 5 MG Tabs tablet Commonly known as: ELIQUIS Take 1 tablet (5 mg total) by mouth 2 (two) times daily.   digoxin 0.125 MG tablet Commonly known as: LANOXIN Take 1 tablet (0.125 mg total) by mouth daily. Start taking on: December 10, 2020   diltiazem 240 MG 24 hr capsule Commonly known as: CARDIZEM CD Take 1 capsule (240 mg total) by mouth daily. Start taking on: December 10, 2020   feeding supplement Liqd Take 237 mLs by mouth 2 (two) times daily between meals.   insulin aspart 100 UNIT/ML injection Commonly known as: novoLOG Inject 0-15 Units into the skin every 4 (four) hours.   levalbuterol 0.63 MG/3ML nebulizer solution Commonly known as: XOPENEX Take 3 mLs (0.63 mg total) by nebulization every 6 (six) hours as needed for  wheezing or shortness of breath.   levothyroxine 100 MCG tablet Commonly known as: SYNTHROID Take 1 tablet (100 mcg total) by mouth daily at 6 (six) AM. Start taking on: December 10, 2020 What changed: when to take this   metoprolol tartrate 25 MG tablet Commonly known as: LOPRESSOR Take 1 tablet (25 mg total) by mouth 3 (three) times daily.   rosuvastatin 20 MG tablet Commonly known as: CRESTOR Take 1 tablet (20 mg total) by mouth daily. Start taking on: December 10, 2020   senna-docusate 8.6-50 MG tablet Commonly known as: Senokot-S Take 1 tablet by mouth at bedtime as needed for mild constipation.        HOME MEDICATIONS PRIOR TO ADMISSION Medications Prior to Admission  Medication Sig Dispense Refill   amLODipine-valsartan (EXFORGE) 5-320 MG tablet Take 1 tablet by mouth daily.     ascorbic acid (VITAMIN C) 250 MG CHEW Chew 500 mg by mouth daily.     Biotin w/ Vitamins C & E (HAIR/SKIN/NAILS PO) Take 1 tablet by mouth daily.     buPROPion (WELLBUTRIN XL) 150 MG 24 hr tablet Take 150 mg by mouth daily.     Cholecalciferol (VITAMIN D3) 125 MCG (5000 UT) CAPS Take 1 capsule by mouth daily.     clopidogrel (PLAVIX) 75 MG tablet Take 1 tablet (75 mg total) by mouth daily. 90 tablet 3   Coenzyme Q10 (CO Q-10) 100 MG CAPS Take 1 tablet by mouth daily.     levothyroxine (SYNTHROID, LEVOTHROID) 100 MCG tablet Take 100 mcg by mouth daily before breakfast.     pyridOXINE (VITAMIN B-6) 100 MG tablet Take 100 mg by mouth daily.     zinc gluconate 50 MG tablet Take 50 mg by mouth daily.       HOSPITAL MEDICATIONS   stroke: mapping our early stages of recovery book   Does not apply Once   [START ON 12/09/2020] amiodarone  200 mg Oral Daily   apixaban  5 mg Oral BID   chlorhexidine gluconate (MEDLINE KIT)  15 mL Mouth Rinse BID   Chlorhexidine Gluconate Cloth  6 each Topical Daily   digoxin  0.125 mg Oral Daily   diltiazem  240 mg Oral Daily   feeding supplement  237 mL Oral BID BM    insulin aspart  0-15 Units Subcutaneous Q4H   [START ON 12/09/2020] levothyroxine  100 mcg Oral Q0600   metoprolol tartrate  25 mg Oral TID   mupirocin ointment  1 application Nasal BID   [START ON 12/09/2020] rosuvastatin  20 mg Oral Daily    LABORATORY STUDIES CBC    Component Value Date/Time   WBC 8.4 12/08/2020  0311   RBC 3.71 (L) 12/08/2020 0311   HGB 10.8 (L) 12/08/2020 0311   HGB 12.9 06/15/2017 0930   HCT 34.6 (L) 12/08/2020 0311   HCT 38.6 06/15/2017 0930   PLT 294 12/08/2020 0311   PLT 265 06/15/2017 0930   MCV 93.3 12/08/2020 0311   MCV 86 06/15/2017 0930   MCH 29.1 12/08/2020 0311   MCHC 31.2 12/08/2020 0311   RDW 13.2 12/08/2020 0311   RDW 14.5 06/15/2017 0930   LYMPHSABS 1.0 12/03/2020 0329   MONOABS 0.7 12/03/2020 0329   EOSABS 0.0 12/03/2020 0329   BASOSABS 0.0 12/03/2020 0329   CMP    Component Value Date/Time   NA 139 12/08/2020 0311   NA 142 06/15/2017 0930   K 4.3 12/08/2020 0311   CL 103 12/08/2020 0311   CO2 28 12/08/2020 0311   GLUCOSE 109 (H) 12/08/2020 0311   BUN 35 (H) 12/08/2020 0311   BUN 19 06/15/2017 0930   CREATININE 1.06 (H) 12/08/2020 0311   CALCIUM 9.5 12/08/2020 0311   PROT 5.9 (L) 12/03/2020 0329   PROT 7.0 06/15/2017 0930   ALBUMIN 3.0 (L) 12/03/2020 0329   ALBUMIN 4.2 06/15/2017 0930   AST 30 12/03/2020 0329   ALT 31 12/03/2020 0329   ALKPHOS 45 12/03/2020 0329   BILITOT 0.4 12/03/2020 0329   BILITOT 0.2 06/15/2017 0930   GFRNONAA 56 (L) 12/08/2020 0311   GFRAA 68 06/15/2017 0930   COAGS Lab Results  Component Value Date   INR 1.10 08/20/2013   Lipid Panel    Component Value Date/Time   CHOL 142 12/03/2020 0330   TRIG 107 12/03/2020 0330   TRIG 100 12/03/2020 0330   HDL 40 (L) 12/03/2020 0330   CHOLHDL 3.6 12/03/2020 0330   VLDL 21 12/03/2020 0330   LDLCALC 81 12/03/2020 0330   HgbA1C  Lab Results  Component Value Date   HGBA1C 6.2 (H) 12/03/2020   Urinalysis    Component Value Date/Time   COLORURINE  YELLOW 12/02/2020 1840   APPEARANCEUR CLOUDY (A) 12/02/2020 1840   LABSPEC 1.028 12/02/2020 1840   PHURINE 5.0 12/02/2020 1840   GLUCOSEU NEGATIVE 12/02/2020 1840   HGBUR NEGATIVE 12/02/2020 1840   BILIRUBINUR NEGATIVE 12/02/2020 1840   KETONESUR NEGATIVE 12/02/2020 1840   PROTEINUR NEGATIVE 12/02/2020 1840   UROBILINOGEN 0.2 08/20/2013 1112   NITRITE POSITIVE (A) 12/02/2020 1840   LEUKOCYTESUR LARGE (A) 12/02/2020 1840   Urine Drug Screen     Component Value Date/Time   LABOPIA NONE DETECTED 12/02/2020 1840   COCAINSCRNUR NONE DETECTED 12/02/2020 1840   LABBENZ NONE DETECTED 12/02/2020 1840   AMPHETMU NONE DETECTED 12/02/2020 1840   THCU NONE DETECTED 12/02/2020 1840   LABBARB NONE DETECTED 12/02/2020 1840    Alcohol Level No results found for: [facility]   SIGNIFICANT DIAGNOSTIC STUDIES  DG Chest Port 1 View  Result Date: 12/07/2020 CLINICAL DATA:  Acute respiratory failure.  Hypoxia. EXAM: PORTABLE CHEST 1 VIEW COMPARISON:  12/03/2020. FINDINGS: Feeding tube noted with tip below left hemidiaphragm. Stable cardiomegaly. Low lung volumes with mild bibasilar atelectasis. Small left pleural effusion no pneumothorax. IMPRESSION: Feeding tube noted with tip below left hemidiaphragm. 2.  Stable cardiomegaly. 3. Low lung volumes with mild bibasilar atelectasis. Small left pleural effusion. Electronically Signed   By: [place]   On: 12/07/2020 07:30   ECHOCARDIOGRAM LIMITED BUBBLE STUDY  Result Date: 12/06/2020    ECHOCARDIOGRAM LIMITED REPORT   Patient Name:   LETA BUCKLIN Date of Exam:  12/06/2020 Medical Rec #:  [id number]        Height:       67.0 in Accession #:    [id number]       Weight:       293.0 lb Date of Birth:  1950-09-21        BSA:          2.379 m Patient Age:    25 years         BP:           107/75 mmHg Patient Gender: F                HR:           115 bpm. Exam Location:  Inpatient Procedure: 2D Echo, Cardiac Doppler, Color Doppler, Intracardiac Opacification             Agent and Saline Contrast Bubble Study Indications:     Stroke 434.91 / I63.9  History:         Patient has prior history of Echocardiogram examinations.                  Stroke and TIA, Arrythmias:Atrial Fibrillation; Risk                  Factors:Hypertension and Dyslipidemia. History of DVT. GERD.                  Edema.  Sonographer:     Darlina Sicilian RDCS Referring Phys:  [id number] [place] Diagnosing Phys: Dixie Dials MD IMPRESSIONS  1. Left ventricular ejection fraction, by estimation, is 40 to 45%. The left ventricle has mildly decreased function. The left ventricle demonstrates global hypokinesis. Left ventricular diastolic parameters are consistent with Grade I diastolic dysfunction (impaired relaxation).  2. Right ventricular systolic function is mildly reduced. The right ventricular size is normal.  3. Left atrial size was moderately dilated.  4. The mitral valve is normal in structure. Mild mitral valve regurgitation.  5. Tricuspid valve regurgitation is mild to moderate.  6. The aortic valve is normal in structure. Aortic valve regurgitation is not visualized. FINDINGS  Left Ventricle: Left ventricular ejection fraction, by estimation, is 40 to 45%. The left ventricle has mildly decreased function. The left ventricle demonstrates global hypokinesis. Definity contrast agent was given IV to delineate the left ventricular  endocardial borders. The left ventricular internal cavity size was normal in size. There is no left ventricular hypertrophy. Left ventricular diastolic parameters are consistent with Grade I diastolic dysfunction (impaired relaxation). Right Ventricle: The right ventricular size is normal. No increase in right ventricular wall thickness. Right ventricular systolic function is mildly reduced. Left Atrium: Left atrial size was moderately dilated. Pericardium: There is no evidence of pericardial effusion. Mitral Valve: The mitral valve is normal in structure. Mild mitral valve  regurgitation. Tricuspid Valve: The tricuspid valve is normal in structure. Tricuspid valve regurgitation is mild to moderate. Aortic Valve: The aortic valve is normal in structure. Aortic valve regurgitation is not visualized. Pulmonic Valve: The pulmonic valve was normal in structure. Pulmonic valve regurgitation is not visualized. Aorta: The aortic root is normal in size and structure. Venous: The inferior vena cava was not well visualized. IAS/Shunts: The atrial septum is grossly normal. Agitated saline contrast was given intravenously to evaluate for intracardiac shunting.  LV Volumes (MOD) LV vol d, MOD A2C: 118.0 ml LV vol d, MOD A4C: 159.0 ml LV vol s, MOD A2C: 65.1 ml LV vol  s, MOD A4C: 90.8 ml LV SV MOD A2C:     52.9 ml LV SV MOD A4C:     159.0 ml LV SV MOD BP:      62.1 ml Dixie Dials MD Electronically signed by Dixie Dials MD Signature Date/Time: 12/06/2020/4:08:12 PM    Final    VAS [place] UPPER EXTREMITY VENOUS DUPLEX  Result Date: 12/08/2020 UPPER VENOUS STUDY  Patient Name:  MAEOLA MCHANEY  Date of Exam:   12/08/2020 Medical Rec #: [id number]         Accession #:    [id number] Date of Birth: April 28, 1951         Patient Gender: F Patient Age:   70Y Exam Location[facility]  [facility] Procedure:      VAS [place] UPPER EXTREMITY VENOUS DUPLEX Referring Phys: [id number] Chrystie Nose PAYNE --------------------------------------------------------------------------------  Indications: "phlebitis" per MD order Limitations: Body habitus, poor ultrasound/tissue interface and immobility. Comparison Study: No previous exams Performing Technologist: Jody Hill RVT, RDMS  Examination Guidelines: A complete evaluation includes B-mode imaging, spectral Doppler, color Doppler, and power Doppler as needed of all accessible portions of each vessel. Bilateral testing is considered an integral part of a complete examination. Limited examinations for reoccurring indications may be performed as noted.  Right Findings:  +----------+------------+---------+-----------+----------+---------------------+ RIGHT     CompressiblePhasicitySpontaneousProperties       Summary        +----------+------------+---------+-----------+----------+---------------------+ IJV           Full       Yes       Yes                                    +----------+------------+---------+-----------+----------+---------------------+ Subclavian    Full       Yes       Yes                                    +----------+------------+---------+-----------+----------+---------------------+ Axillary      Full       Yes       Yes                                    +----------+------------+---------+-----------+----------+---------------------+ Brachial                 Yes       Yes                Compression image                                                       lost, patent by color                                                          and doppler      +----------+------------+---------+-----------+----------+---------------------+ Radial        Full                                                        +----------+------------+---------+-----------+----------+---------------------+  Ulnar         Full                                                        +----------+------------+---------+-----------+----------+---------------------+ Cephalic      Full                                                        +----------+------------+---------+-----------+----------+---------------------+ Basilic       Full       Yes       Yes                                    +----------+------------+---------+-----------+----------+---------------------+  Left Findings: +----------+------------+---------+-----------+----------+---------------+ LEFT      CompressiblePhasicitySpontaneousProperties    Summary     +----------+------------+---------+-----------+----------+---------------+ IJV            Full       Yes       Yes                              +----------+------------+---------+-----------+----------+---------------+ Subclavian    Full       Yes       Yes                              +----------+------------+---------+-----------+----------+---------------+ Axillary      Full       Yes       Yes                              +----------+------------+---------+-----------+----------+---------------+ Brachial      Full       Yes       Yes                              +----------+------------+---------+-----------+----------+---------------+ Radial        Full                                                  +----------+------------+---------+-----------+----------+---------------+ Ulnar         Full                                                  +----------+------------+---------+-----------+----------+---------------+ Cephalic      None       No        No               Forearm - Acute +----------+------------+---------+-----------+----------+---------------+ Basilic       Full       Yes       Yes                              +----------+------------+---------+-----------+----------+---------------+  Summary:  Right: No evidence of deep vein thrombosis in the upper extremity. No evidence of superficial vein thrombosis in the upper extremity.  Left: No evidence of deep vein thrombosis in the upper extremity. Findings consistent with acute superficial vein thrombosis involving the left cephalic vein at the level of the forearm.  *See table(s) above for measurements and observations.     Preliminary      MRI HEAD WITHOUT CONTRAST 12/03/20 TECHNIQUE: Multiplanar, multiecho pulse sequences of the brain and surrounding structures were obtained without intravenous contrast. COMPARISON:  CT angio head neck 12/02/2020 FINDINGS: Brain: Restricted diffusion compatible with acute infarct in the left frontal parietal cortex. This appears to be  the postcentral gyrus. Mild-to-moderate area of cortical infarction. No associated hemorrhage. No other areas of acute infarct. Mild white matter changes with patchy white matter hyperintensities bilaterally. Small hyperintensity central pons. No hydrocephalus or mass. Vascular: Normal arterial flow voids. Skull and upper cervical spine: No focal skeletal lesion Sinuses/Orbits: Mild mucosal edema paranasal sinuses. Negative orbit Other: None IMPRESSION: Small to moderate acute cortical infarct in the left frontal parietal cortex primarily in the postcentral gyrus. No associated hemorrhage. Electronically Signed   By: Franchot Gallo M.D.   On: 12/03/2020 16:05  Narrative & Impression  IR PERCUTANEOUS ART THROMBECTOMY/INFUSION INTRACRANIAL INC DIAG ANGIO  INDICATION: New onset right-sided weakness with global aphasia. Occluded superior division of the left middle cerebral artery on CT angiogram of the head and neck.   EXAM: 1. EMERGENT LARGE VESSEL OCCLUSION THROMBOLYSIS (anterior CIRCULATION)   COMPARISON:  CT angiogram of the head and neck of December 02, 2020.   MEDICATIONS: Ancef 2 g IV antibiotic was administered within 1 hour of the procedure.   ANESTHESIA/SEDATION: General anesthesia.   CONTRAST:  Omnipaque 300 approximately 70 mL.   FLUOROSCOPY TIME:  Fluoroscopy Time: 53 minutes 18 seconds (1538 mGy).   COMPLICATIONS: None immediate.   TECHNIQUE: Following a full explanation of the procedure along with the potential associated complications, an informed witnessed consent was obtained. The risks of intracranial hemorrhage of 10%, worsening neurological deficit, ventilator dependency, death and inability to revascularize were all reviewed in detail with the patient's spouse.   The patient was then put under general anesthesia by the Department of Anesthesiology at [facility].   The right groin was prepped and draped in the usual sterile  fashion. Thereafter using modified Seldinger technique, transfemoral access into the right common femoral artery was obtained without difficulty. Over a 0.035 inch guidewire an8 [place] Pinnacle 25 cm sheath was inserted. Through this, and also over a 0.035 inch guidewire a 5 [place] JB 1 catheter was advanced to the aortic arch region and selectively positioned left common carotid artery.   FINDINGS: The left common carotid arteriogram demonstrates the left external carotid artery and its major branches to be widely patent.   The left internal carotid artery at the bulb to the cranial skull base is widely patent.   There is a small focal ulceration seen of the left internal carotid artery just distal to the bulb associated with mild narrowing.   More distally the left internal carotid artery is seen to opacify to the cranial skull base. The petrous, the cavernous and the supraclinoid segments are widely patent.   The left anterior cerebral artery opacifies into the capillary and venous phases.   The left middle cerebral artery M1 segment is widely patent.   Patency is seen of the left middle cerebral artery inferior division, and the  anterior temporal branch.   A stump of an occluded superior division is noted proximally.   PROCEDURE: Over a 0.035 inch 300 cm Rosen exchange guidewire, an 087 balloon guide catheter which had been prepped with 50% contrast and 50% heparinized saline infusion was advanced and positioned just proximal to the left common carotid bifurcation. The guidewire was removed. Good aspiration obtained from the hub of the balloon guide catheter. A control arteriogram performed through this demonstrated no evidence of focal vasospasm, or of dissection or of filling defects.   Over a 0.014 inch standard Synchro micro guidewire with a J-tip configuration, a combination of an 021 160 cm Trevo Trak microcatheter inside of an 071 Zoom 132 cm aspiration catheter  was advanced to the supraclinoid left ICA.   The micro guidewire was then gently advanced through the occluded superior division into the M2 M3 region followed by the microcatheter.   The Zoom aspiration catheter was now advanced into the proximal left M1 segment.   The guidewire was removed. Good aspiration obtained from the hub of the microcatheter. A gentle control arteriogram performed through the microcatheter demonstrated safe position of the tip of the microcatheter with antegrade flow.   This was then connected to continuous heparinized saline infusion. A 4 mm x 40 mm Solitaire X retrieval device was then advanced to the distal end of the microcatheter.   With slight forward gentle traction with the right hand on the delivery micro guidewire, with the left hand the delivery microcatheter was retrieved unsheathing the retrieval device the proximal end of which was just proximal to the occluded superior division.   The Zoom aspiration catheter was now advanced into the orifice of the superior division.   Thereafter, with proximal flow arrest in the left internal carotid artery, and constant aspiration applied with a 20 mL syringe at the hub of the balloon guide catheter, hub of the Zoom aspiration catheter for approximately 2-1/2 minutes, the combination of the retrieval device, the microcatheter, and the Zoom aspiration catheter were retrieved and removed.   A few specks were seen in the struts of the retrieval device.   A control arteriogram performed through the left internal carotid artery following flow reversal of flow arrest demonstrated improved flow through the superior division. A TICI 2C revascularization was achieved. However, there continued be an appreciable area of hypoperfusion involving the superior division.   Over a 0.014 inch standard Synchro micro guidewire with a J configuration, a 132 cm 6 [place] Catalyst guide catheter inside of which was an  021 160 cm Trevo Trak microcatheter was advanced again into the supraclinoid left ICA.   The micro guidewire was then gently advanced using a torque device through the superior division into the more anterior branch followed by the microcatheter.   The guidewire was removed. Good aspiration obtained from the hub of the microcatheter which was now in the distal M2 segment. A gentle arteriogram performed through the microcatheter demonstrates safe position of the tip of the microcatheter with good antegrade flow.   This was then connected to continuous heparinized saline infusion.   A 3 mm x 32 mm Trevo ProVue retrieval device was then advanced to the distal end of the microcatheter.   The retrieval device was deployed as mentioned earlier.   The Catalyst guide catheter was advanced out of the orifice of the superior division.   With proximal flow arrest in the left internal carotid artery, and constant aspiration applied at the hub of the balloon  guide catheter, and at the hub of the [place] guide catheter with an aspiration pump for 2-1/2 minutes, the combination of the retrieval device, the microcatheter, and the aspiration catheter were retrieved and removed. Following reversal of flow arrest, control arteriogram performed through the balloon guide catheter in the left internal carotid artery demonstrated near complete revascularization of the left middle cerebral artery distribution achieving a TICI 2C plus revascularization.   The left anterior cerebral artery remained widely patent.   The balloon guide was then retrieved more proximally into the left common carotid artery. A control arteriogram performed at this time demonstrated wide patency of the left internal carotid artery extra cranially and intracranially.   The previously noted ulceration at the proximal aspect of the left internal carotid remains stable. Patency was seen of the left middle cerebral  artery with unchanged rapid perfusion of TICI 2C plus. The balloon guide was removed. The 8 French Pinnacle sheath was removed with successful hemostasis at the right groin with a 6 French Angio-Seal closure device. Distal pulses remained present in both feet unchanged.   CT of the brain performed demonstrated no evidence of intracranial hemorrhage or mass effect.   The patient was left intubated due to pre procedural global aphasia and difficulty with communication.   Manual pressure was held at the right groin puncture site for hemostasis due to failure of the Angio-Seal closure device. Distal pulses remained present in both feet unchanged.   IMPRESSION: Status post endovascular revascularization of occluded superior division of the left middle cerebral artery with 1 pass with the 4 mm x 40 mm Solitaire X retrieval device and contact aspiration, and 1 pass with a 3 mm x 32 mm Trevo ProVue retriever and contact aspiration achieving a TICI 2C plus revascularization.    PORTABLE ABDOMEN - 1 VIEW 12/03/20 COMPARISON:  None. FINDINGS: Nasogastric tube with the tip projecting over the antrum of the stomach. No bowel dilatation to suggest obstruction. No evidence of pneumoperitoneum, portal venous gas or pneumatosis. No pathologic calcifications along the expected course of the ureters. No acute osseous abnormality. IMPRESSION: Nasogastric tube with the tip projecting over the antrum of the stomach. Electronically Signed   By: Kathreen Devoid   On: 12/03/2020 11:35  PORTABLE CHEST 1 VIEW 12/02/20 FINDINGS: Endotracheal tube tip is at the level of the clavicular heads. There is moderate cardiomegaly. This is likely exaggerated by supine positioning. Mild left-greater-than-right interstitial opacities, slightly worsened. IMPRESSION: Endotracheal tube tip at the level of the clavicular heads. Worsening of left-greater-than-right interstitial opacities favored to indicate  pulmonary edema, though pneumonia remains difficult to exclude. Electronically Signed   By: Ulyses Jarred M.D.   On: 12/02/2020 19:27  PORTABLE CHEST 1 VIEW 12/03/20 FINDINGS: Endotracheal tube with the tip 4.5 cm above the carina. Bilateral mild interstitial thickening. No pleural effusion or pneumothorax. Stable cardiomegaly. No acute osseous abnormality. IMPRESSION: Cardiomegaly with pulmonary vascular congestion. Endotracheal tube with the tip 4.5 cm above the carina. Electronically Signed   By: Kathreen Devoid   On: 12/03/2020 11:43    HISTORY OF PRESENT ILLNESS (From Dr Clydene Fake H&P 12/02/20) Stroke Neurology H $ P Note Consult Requested by: Dr. Gustavus Messing Reason for Consult: Code Stroke The history was obtained from chart review History of Present Illness:  ALAYZIA PAVLOCK is an 70 y.o. female with hypertension, hyperlipidemia, prior TIA, and history of DVT who was admitted to [facility] for afib RVR. She had been placed on eliquis. At 1200 today, she  was noted to have acute onset aphasia and right sided weakness. Code stroke was activated at [facility] and CTA revealed LVO.  With occlusion of the superior division of the left middle cerebral artery.  She has been transferred to [facility] for emergent mechanical thrombectomy.  She received a dose of Eliquis this morning.  Her neurological exam upon arrival is consistent with aphasia and mild right hemiparesis with NIH stroke scale of 14.  I spoke to the patient's husband over the phone and obtained consent for emergent thrombectomy and also discussed the case with Dr. Estanislado Pandy and patient was then sent for mechanical thrombectomy. Time last known well: 12pm tPA Given: No MRS:  ? NIHSS:  [place] Ms. LAVONE BARRIENTES is a 70 y.o. female with history of hypertension, hyperlipidemia, DVT hx, and prior TIA who presented as a code stroke from [facility] after being found with right sided weakness and  aphasia earlier today while admitted for afib RVR. CTA revealed a left MCA LVO. tPA was not given as she was on eliquis. She was taken for thrombectomy.   Stroke - left MCA infarct due to left MCA occlusion s/p thrombectomy with  TICI2c, etiology likely due to newly diagnosed A. fib CT no acute finding CT head and neck left MCA occlusion S/p IR with TICI2c revascularization MRI 12/02/20 - Small to moderate acute cortical infarct in the left frontal parietal cortex primarily in the postcentral gyrus. No associated hemorrhage. Echocardiogram EF 40 to 50% LDL 81 HgbA1c 6.2 DVT prophylaxis Heparin IV Plavix PTA,now on eliquis Therapy recommendations - CIR Disposition: CIR   Afib RVR Improving Cardiology on board - appreciate assistance On digoxin 0.125 mg daily Continue metoprolol to 25 mg 3 times daily Increased cardizem to 84m Q6h Transitioned Heparin IV to eliquis.   History of TIA 05/2017 right arm numbness and weakness lasting for minutes.  [facility]  In 07/2017 EF 55 to 60%, carotid Doppler showed left ICA 40 to 59% stenosis.  MRI negative for acute stroke.   Hyperlipidemia No statin at home LDL 81, goal < 70 Start crestor 259mdaily Continue statin on discharge   HTN BP on the low end On metoprolol and diltizam per cardiology BP monitoring   Dysphagia Passed swallow On dys2 and thin Cortrak d/c'ed Speech on board   Other stroke risk factors Morbid obesity, BMI 46.27   Other acute issues Anemia of chronic disease, Hb 10.4 ->11.0->11.1->10.9->10.8 Leukocytosis WBC 10.7 ->11.5->12.8->10.4->9.5->8.4 (afebrile) AKI creatinine 1.17-> 0.92->1.04->1.03->1.06  Malnutrition Type: Nutrition Problem: Inadequate oral intake Etiology: dysphagia Malnutrition Characteristics: Signs/Symptoms: meal completion < 50% (modified diet) Nutrition Interventions: Interventions: Tube feeding, Prostat  DISCHARGE EXAM Vitals:   12/08/20 0350 12/08/20 0500 12/08/20  0733 12/08/20 1152  BP: (!) 141/76  125/69 130/77  Pulse: 88  (!) 109 76  Resp: 18  (!) 24 18  Temp: 98.9 F (37.2 C)  98.2 F (36.8 C) (!) 97.3 F (36.3 C)  TempSrc: Oral  Oral Oral  SpO2: 96%  96% 98%  Weight:  133.8 kg    Height:        General - Well nourished, well developed, not in acute distress.   Ophthalmologic - fundi not visualized due to noncooperation.   Cardiovascular - irregularly irregular heart rate and rhythm.   Neuro - awake, alert, eyes open, able to answer orientation questions but please significant paraphasic errors. Difficulty with spontaneous speech, but able to repeat simple sentence but not  complex sentences, naming 2/4, following simple commands. No gaze palsy, tracking bilaterally, visual field full, PERRL. Mild right facial droop. Tongue midline. LUE 4/5 and RUE 3-/5 proximal and distal. Bilateral LEs 4/5, no drift. Sensation symmetrical bilaterally, left FTN intact although slow, gait not tested.     DISCHARGE DIET   Diet Order             DIET DYS 2 Room service appropriate? Yes; Fluid consistency: Thin  Diet effective now                  liquids  DISCHARGE PLAN Disposition:  CIR Eliquis (apixaban) daily for secondary stroke prevention. Ongoing risk factor control by Primary Care Physician at time of discharge Follow-up Ronita Hipps, MD in 2 weeks. Follow-up in [facility] in 4 weeks, office to schedule an appointment.   33 minutes were spent preparing discharge.  Laurey Morale, MSN, NP-C [facility] [phone]  ATTENDING NOTE: I reviewed above note and agree with the assessment and plan. Pt was seen and examined.   No acute event overnight.  Patient sitting in chair, has to family member at bedside, patient awake alert, smiling, laughing, speech continues to improve as well as right upper extremity.  On Eliquis and statin.  Ready to discharge to CIR.  Follow-up at stroke clinic  at [facility] in 4 weeks.  For detailed assessment and plan, please refer to above as I have made changes wherever appropriate.   Rosalin Hawking, MD PhD Stroke Neurology 12/09/2020 4:54 PM

## 2020-12-08 NOTE — Progress Notes (Signed)
BUE venous duplex has been completed.  Results can be found under chart review under CV PROC. 12/08/2020 2:59 PM Veroncia Jezek RVT, RDMS

## 2020-12-08 NOTE — Care Management Important Message (Signed)
Important Message  Patient Details  Name: Candice Perez MRN: [id number] Date of Birth: 06/05/1950   Medicare Important Message Given:  Yes - Important Message mailed due to current National Emergency  Verbal consent obtained due to current National Emergency  Relationship to patient: Self Contact Name: Shantia Call Date: 12/08/20  Time: 1418 Phone: [phone] Outcome: No Answer/Busy Important Message mailed to: Patient address on file    Delorse Lek 12/08/2020, 2:18 PM

## 2020-12-08 NOTE — Progress Notes (Signed)
Inpatient Rehab Admissions Coordinator:   I do not yet have insurance auth or a bed for this pt on CIR; however, note that she is progressing with therapies and now requiring only 2 L of oxygen. I will continue to follow and pursue for potential admit pending insurance auth and bed availability.   Clemens Catholic, [place], [place] Admissions Coordinator  [phone] ([place]) [phone] (office)

## 2020-12-08 NOTE — Progress Notes (Signed)
Physical Therapy Treatment Patient Details Name: Candice Perez MRN: [id number] DOB: November 15, 1950 Today's Date: 12/08/2020    History of Present Illness The pt is a 70 yo female presenting to [facility] on 7/21 due to new onset afib with RVR, but required transfer to [facility] from [place] via EMS on 7/21 after acute onset aphasia and R sided weakness. The pt is now s/p revascularization of L MCA on 7/21. PMH includes: HTN, HLD, TIA, stoke, DVT, depression, and GERD.    PT Comments    Patient received in bed, family and pastor in room upon arrival. Patient is agreeable to PT session. She is very pleasant and motivated. Has improvement in R UE and LE strength and movement. Has no feeling on right side of body. Patient is mod independent with bed mobility. She was able to get herself to edge of bed with no physical assist. Increased time and effort. She is able to stand from bed with mod +2 assist initially, then progressed to min +2 assist with cues and practice. Patient able to march in place in steady with mostly single UE support and min guard. No knee buckling noted. Patient making good progress toward goals and will continue to benefit from skilled PT while here to improve strength, endurance and functional independence.        Follow Up Recommendations  CIR     Equipment Recommendations  Other (comment) (TBD)    Recommendations for Other Services Rehab consult     Precautions / Restrictions Precautions Precautions: Fall Precaution Comments: HHFNC, watch HR Restrictions Weight Bearing Restrictions: No    Mobility  Bed Mobility Overal bed mobility: Modified Independent Bed Mobility: Supine to Sit     Supine to sit: Supervision;HOB elevated     General bed mobility comments: Patient was able to get herself seated at edge of bed using bed rails with supervision. increased time and effort to perform.    Transfers Overall transfer level: Needs assistance Equipment used:  Ambulation equipment used Transfers: Sit to/from [facility] Sit to Stand: Mod assist;+2 physical assistance;From elevated surface;Min assist Stand pivot transfers: Mod assist       General transfer comment: Patient initially required mod assist for sit to stand and cues for "nose over toes", upon subsequent trials she required min assist. Performed STS x 4.  Improved midline standing.  Ambulation/Gait             General Gait Details: marching in place in stedy x 2 sets of about 15 prior to fatiguing. Patient should be able to try Hemiwalker next session   Stairs             Wheelchair Mobility    Modified Rankin (Stroke Patients Only) Modified Rankin (Stroke Patients Only) Pre-Morbid Rankin Score: No symptoms Modified Rankin: Severe disability     Balance Overall balance assessment: Needs assistance Sitting-balance support: Feet supported Sitting balance-Leahy Scale: Good     Standing balance support: Bilateral upper extremity supported;During functional activity Standing balance-Leahy Scale: Fair Standing balance comment: Patient demonstrated no knee buckling this session. Able to march in place using stedy. Requires at least single UE support and min A +2                            Cognition Arousal/Alertness: Awake/alert Behavior During Therapy: WFL for tasks assessed/performed Overall Cognitive Status: Impaired/Different from baseline Area of Impairment: Problem solving  Following Commands: Follows multi-step commands with increased time;Follows one step commands consistently       General Comments: pt able to follow commands consistently, aware of deficits including speech recommendations, incresaed time to process      Exercises Other Exercises Other Exercises: seated LAQ, AP, heel slides, hip abd/add x 10-15 reps bilaterally.    General Comments        Pertinent Vitals/Pain Pain  Assessment: Faces Faces Pain Scale: Hurts little more Pain Location: R UE sore Pain Descriptors / Indicators: Grimacing;Tightness Pain Intervention(s): Monitored during session;Repositioned    Home Living                      Prior Function            PT Goals (current goals can now be found in the care plan section) Acute Rehab PT Goals Patient Stated Goal: to go to rehab PT Goal Formulation: With patient Time For Goal Achievement: 12/18/20 Potential to Achieve Goals: Good Progress towards PT goals: Progressing toward goals    Frequency    Min 4X/week      PT Plan Current plan remains appropriate    Co-evaluation              AM-PAC PT "6 Clicks" Mobility   Outcome Measure  Help needed turning from your back to your side while in a flat bed without using bedrails?: A Little Help needed moving from lying on your back to sitting on the side of a flat bed without using bedrails?: A Little Help needed moving to and from a bed to a chair (including a wheelchair)?: A Lot Help needed standing up from a chair using your arms (e.g., wheelchair or bedside chair)?: A Lot Help needed to walk in hospital room?: A Lot Help needed climbing 3-5 steps with a railing? : Total 6 Click Score: 13    End of Session Equipment Utilized During Treatment: Gait belt;Oxygen Activity Tolerance: Patient tolerated treatment well Patient left: in chair;with call bell/phone within reach;with family/visitor present Nurse Communication: Mobility status;Need for lift equipment PT Visit Diagnosis: Other abnormalities of gait and mobility (R26.89);Muscle weakness (generalized) (M62.81);Hemiplegia and hemiparesis;Difficulty in walking, not elsewhere classified (R26.2) Hemiplegia - Right/Left: Right Hemiplegia - dominant/non-dominant: Dominant Hemiplegia - caused by: Cerebral infarction     Time: 1050-1130 PT Time Calculation (min) (ACUTE ONLY): 40 min  Charges:  $Gait Training:  38-52 mins $Therapeutic Exercise: 38-52 mins $Therapeutic Activity: 38-52 mins                    Setareh Rom, PT, GCS 12/08/20,12:17 PM

## 2020-12-08 NOTE — Consult Note (Signed)
Ref: Ronita Hipps, MD   Subjective:  Awake. VS stable. Atrial fibrillation continues.  Objective:  Vital Signs in the last 24 hours: Temp:  [97.6 F (36.4 C)-98.9 F (37.2 C)] 98.2 F (36.8 C) (07/27 0733) Pulse Rate:  [51-109] 109 (07/27 0733) Cardiac Rhythm: Atrial fibrillation (07/27 0700) Resp:  [18-26] 24 (07/27 0733) BP: (91-141)/(62-78) 125/69 (07/27 0733) SpO2:  [96 %-100 %] 96 % (07/27 0733) Weight:  [133.8 kg] 133.8 kg (07/27 0500)  Physical Exam: BP Readings from Last 1 Encounters:  12/08/20 125/69     Wt Readings from Last 1 Encounters:  12/08/20 133.8 kg    Weight change:  Body mass index is 46.2 kg/m. HEENT: [place]/AT, Eyes-Blue, Conjunctiva-Pink, Sclera-Non-icteric Neck: No JVD, No bruit, Trachea midline. Lungs:  Clear, Bilateral. Cardiac:  Irregular rhythm, normal S1 and S2, no S3. II/VI systolic murmur. Abdomen:  Soft, non-tender. BS present. Extremities:  No edema present. No cyanosis. No clubbing. CNS: AxOx3, Cranial nerves grossly intact, moves all 4 extremities. Right sided weakness. Skin: Warm and dry.   Intake/Output from previous day: 07/26 0701 - 07/27 0700 In: 360 [P.O.:360] Out: 1250 [Urine:1250]    Lab Results: BMET    Component Value Date/Time   NA 139 12/08/2020 0311   NA 139 12/07/2020 0211   NA 141 12/06/2020 0817   NA 142 06/15/2017 0930   K 4.3 12/08/2020 0311   K 4.1 12/07/2020 0211   K 4.0 12/06/2020 0817   CL 103 12/08/2020 0311   CL 103 12/07/2020 0211   CL 105 12/06/2020 0817   CO2 28 12/08/2020 0311   CO2 28 12/07/2020 0211   CO2 30 12/06/2020 0817   GLUCOSE 109 (H) 12/08/2020 0311   GLUCOSE 102 (H) 12/07/2020 0211   GLUCOSE 117 (H) 12/06/2020 0817   BUN 35 (H) 12/08/2020 0311   BUN 36 (H) 12/07/2020 0211   BUN 34 (H) 12/06/2020 0817   BUN 19 06/15/2017 0930   CREATININE 1.06 (H) 12/08/2020 0311   CREATININE 1.04 (H) 12/07/2020 0211   CREATININE 1.03 (H) 12/06/2020 0817   CALCIUM 9.5 12/08/2020 0311    CALCIUM 9.5 12/07/2020 0211   CALCIUM 9.2 12/06/2020 0817   GFRNONAA 56 (L) 12/08/2020 0311   GFRNONAA 57 (L) 12/07/2020 0211   GFRNONAA 58 (L) 12/06/2020 0817   GFRAA 68 06/15/2017 0930   GFRAA 64 (L) 08/28/2013 0429   GFRAA 77 (L) 08/27/2013 0456   CBC    Component Value Date/Time   WBC 8.4 12/08/2020 0311   RBC 3.71 (L) 12/08/2020 0311   HGB 10.8 (L) 12/08/2020 0311   HGB 12.9 06/15/2017 0930   HCT 34.6 (L) 12/08/2020 0311   HCT 38.6 06/15/2017 0930   PLT 294 12/08/2020 0311   PLT 265 06/15/2017 0930   MCV 93.3 12/08/2020 0311   MCV 86 06/15/2017 0930   MCH 29.1 12/08/2020 0311   MCHC 31.2 12/08/2020 0311   RDW 13.2 12/08/2020 0311   RDW 14.5 06/15/2017 0930   LYMPHSABS 1.0 12/03/2020 0329   MONOABS 0.7 12/03/2020 0329   EOSABS 0.0 12/03/2020 0329   BASOSABS 0.0 12/03/2020 0329   HEPATIC Function Panel Recent Labs    12/03/20 0329  PROT 5.9*   HEMOGLOBIN A1C No components found for: HGA1C,  MPG CARDIAC ENZYMES No results found for: CKTOTAL, CKMB, CKMBINDEX, TROPONINI BNP No results for input(s): PROBNP in the last 8760 hours. TSH No results for input(s): TSH in the last 8760 hours. CHOLESTEROL Recent Labs  12/03/20 0330  CHOL 142    Scheduled Meds:   stroke: mapping our early stages of recovery book   Does not apply Once   [START ON 12/09/2020] amiodarone  200 mg Oral Daily   apixaban  5 mg Oral BID   chlorhexidine gluconate (MEDLINE KIT)  15 mL Mouth Rinse BID   Chlorhexidine Gluconate Cloth  6 each Topical Daily   digoxin  0.125 mg Oral Daily   diltiazem  240 mg Oral Daily   feeding supplement  237 mL Oral BID BM   insulin aspart  0-15 Units Subcutaneous Q4H   [START ON 12/09/2020] levothyroxine  100 mcg Oral Q0600   metoprolol tartrate  25 mg Oral TID   mupirocin ointment  1 application Nasal BID   [START ON 12/09/2020] rosuvastatin  20 mg Oral Daily   Continuous Infusions: PRN Meds:.acetaminophen **OR** acetaminophen (TYLENOL) oral liquid 160  mg/5 mL **OR** acetaminophen, levalbuterol, senna-docusate  Assessment/Plan:  Acute stroke Atrial fibrillation, chronic Acute respiratory failure with hypoxia CKD, II Type 2 DM Morbid obesity  Plan: Change diltiazem to 240 mg. one a day. Increase activity. Awaiting rehab.   LOS: 6 days   Time spent including chart review, lab review, examination, discussion with patient/Family :30  min   Dixie Dials  MD  12/08/2020, 10:55 AM

## 2020-12-08 NOTE — Progress Notes (Signed)
STROKE TEAM PROGRESS NOTE   INTERVAL HISTORY Husband at bedside.  Patient reclining in bed, continues to have improvement of expressive aphasia.  On diet, tolerating well, will DC core track.  PT/OT recommend CIR.  Vitals:   12/07/20 2354 12/08/20 0350 12/08/20 0500 12/08/20 0733  BP: 105/68 (!) 141/76  125/69  Pulse:  88  (!) 109  Resp:  18  (!) 24  Temp:  98.9 F (37.2 C)  98.2 F (36.8 C)  TempSrc:  Oral  Oral  SpO2:  96%  96%  Weight:   133.8 kg   Height:       CBC:  Recent Labs  Lab 12/03/20 0329 12/04/20 1301 12/07/20 0211 12/08/20 0311  WBC 10.7*   < > 9.5 8.4  NEUTROABS 8.9*  --   --   --   HGB 10.4*   < > 11.0* 10.8*  HCT 32.2*   < > 35.7* 34.6*  MCV 93.9   < > 95.2 93.3  PLT 229   < > 295 294   < > = values in this interval not displayed.   Basic Metabolic Panel:  Recent Labs  Lab 12/03/20 0242 12/03/20 0329 12/05/20 0635 12/06/20 0817 12/07/20 0211 12/08/20 0311  NA  --    < > 141   < > 139 139  K  --    < > 3.9   < > 4.1 4.3  CL  --    < > 104   < > 103 103  CO2  --    < > 28   < > 28 28  GLUCOSE  --    < > 121*   < > 102* 109*  BUN  --    < > 32*   < > 36* 35*  CREATININE  --    < > 1.07*   < > 1.04* 1.06*  CALCIUM  --    < > 8.6*   < > 9.5 9.5  MG 2.2  --  2.1  --   --   --    < > = values in this interval not displayed.   Lipid Panel:  Recent Labs  Lab 12/03/20 0330  CHOL 142  TRIG 107  100  HDL 40*  CHOLHDL 3.6  VLDL 21  LDLCALC 81   HgbA1c:  Recent Labs  Lab 12/03/20 0330  HGBA1C 6.2*   Urine Drug Screen:  Recent Labs  Lab 12/02/20 1840  LABOPIA NONE DETECTED  COCAINSCRNUR NONE DETECTED  LABBENZ NONE DETECTED  AMPHETMU NONE DETECTED  THCU NONE DETECTED  LABBARB NONE DETECTED    MR Brain WO Contrast 12/02/20 IMPRESSION: Small to moderate acute cortical infarct in the left frontal parietal cortex primarily in the postcentral gyrus. No associated hemorrhage.  PHYSICAL EXAM  Temp:  [97.6 F (36.4 C)-98.9 F (37.2  C)] 98.2 F (36.8 C) (07/27 0733) Pulse Rate:  [51-109] 109 (07/27 0733) Resp:  [18-26] 24 (07/27 0733) BP: (91-141)/(62-76) 125/69 (07/27 0733) SpO2:  [96 %-100 %] 96 % (07/27 0733) Weight:  [133.8 kg] 133.8 kg (07/27 0500)  General - Well nourished, well developed, not in acute distress.  Ophthalmologic - fundi not visualized due to noncooperation.  Cardiovascular - irregularly irregular heart rate and rhythm.  Neuro - awake, alert, eyes open, able to answer orientation questions but please significant paraphasic errors. Difficulty with spontaneous speech, but able to repeat simple sentence but not complex sentences, naming 2/4, following simple commands. No gaze palsy, tracking  bilaterally, visual field full, PERRL. Mild right facial droop. Tongue midline. LUE 4/5 and RUE 3-/5 proximal and distal. Bilateral LEs 4/5, no drift. Sensation symmetrical bilaterally, left FTN intact although slow, gait not tested.    ASSESSMENT/PLAN [name] is a 70 y.o. female with history of hypertension, hyperlipidemia, DVT hx, and prior TIA who presented as a code stroke from [facility] after being found with right sided weakness and aphasia earlier today while admitted for afib RVR. CTA revealed a left MCA LVO. tPA was not given as she was on eliquis. She was taken for thrombectomy.  Stroke - left MCA infarct due to left MCA occlusion s/p thrombectomy with  TICI2c, etiology likely due to newly diagnosed A. fib CT no acute finding CT head and neck left MCA occlusion S/p IR with TICI2c revascularization MRI 12/02/20 - Small to moderate acute cortical infarct in the left frontal parietal cortex primarily in the postcentral gyrus. No associated hemorrhage. Echocardiogram EF 45 to 50% LDL 81 HgbA1c 6.2 DVT prophylaxis Heparin IV Plavix PTA,now on eliquis Therapy recommendations - CIR Disposition: Pending  Afib RVR, new diagnosis Cardiology on board - appreciate assistance On digoxin  0.125 mg daily Continue metoprolol to 25 mg 3 times daily Continue cardizem to '60mg'$  Q6h Rate largely controlled now Continue eliquis.  History of TIA 05/2017 right arm numbness and weakness lasting for minutes.  Follows with Dr. Evelena Leyden at [facility].  In 07/2017 EF 55 to 60%, carotid Doppler showed left ICA 40 to 59% stenosis.  MRI negative for acute stroke.  Hyperlipidemia No statin at home LDL 81, goal < 70 Start crestor '20mg'$  daily Continue statin on discharge  HTN BP stable On metoprolol and diltizam per cardiology BP monitoring  Dysphagia Passed swallow On dys2 and thin  DC core track Speech on board  Other stroke risk factors Morbid obesity, BMI 46.27  Other acute issues Anemia of chronic disease, Hb 10.4 ->11.0->11.1->10.9->10.8 Leukocytosis WBC 10.7 ->11.5->12.8->10.4->9.5->8.4 (afebrile) AKI creatinine 1.17-> 0.92->1.04->1.03->1.06  Hospital day # 6  Rosalin Hawking, MD PhD Stroke Neurology 12/08/2020 6:14 PM      To contact Stroke Continuity provider, please refer to [email or web address] After hours, contact General Neurology

## 2020-12-08 NOTE — Progress Notes (Signed)
MD wrote order to remove feeding tube from rt nostril. Tube removed without any difficulty. Pt tolerated well. Tube intact. Staff will continue to monitor.

## 2020-12-09 ENCOUNTER — Encounter (HOSPITAL_COMMUNITY): Payer: Self-pay | Admitting: Physical Medicine and Rehabilitation

## 2020-12-09 ENCOUNTER — Inpatient Hospital Stay (HOSPITAL_COMMUNITY)
Admission: RE | Admit: 2020-12-09 | Discharge: 2020-12-30 | DRG: 056 | Disposition: A | Discharge status: Home Health | Payer: Medicare HMO | Source: Intra-hospital | Attending: Physical Medicine and Rehabilitation | Admitting: Physical Medicine and Rehabilitation

## 2020-12-09 ENCOUNTER — Other Ambulatory Visit: Payer: Self-pay

## 2020-12-09 DIAGNOSIS — U071 COVID-19: Secondary | ICD-10-CM | POA: Diagnosis not present

## 2020-12-09 DIAGNOSIS — R000 Tachycardia, unspecified: Secondary | ICD-10-CM | POA: Diagnosis not present

## 2020-12-09 DIAGNOSIS — M79605 Pain in left leg: Secondary | ICD-10-CM | POA: Diagnosis not present

## 2020-12-09 DIAGNOSIS — Z79899 Other long term (current) drug therapy: Secondary | ICD-10-CM

## 2020-12-09 DIAGNOSIS — I69351 Hemiplegia and hemiparesis following cerebral infarction affecting right dominant side: Principal | ICD-10-CM

## 2020-12-09 DIAGNOSIS — N179 Acute kidney failure, unspecified: Secondary | ICD-10-CM | POA: Diagnosis not present

## 2020-12-09 DIAGNOSIS — E785 Hyperlipidemia, unspecified: Secondary | ICD-10-CM | POA: Diagnosis present

## 2020-12-09 DIAGNOSIS — Z7902 Long term (current) use of antithrombotics/antiplatelets: Secondary | ICD-10-CM

## 2020-12-09 DIAGNOSIS — I69312 Visuospatial deficit and spatial neglect following cerebral infarction: Secondary | ICD-10-CM

## 2020-12-09 DIAGNOSIS — I69391 Dysphagia following cerebral infarction: Secondary | ICD-10-CM

## 2020-12-09 DIAGNOSIS — I10 Essential (primary) hypertension: Secondary | ICD-10-CM

## 2020-12-09 DIAGNOSIS — I69392 Facial weakness following cerebral infarction: Secondary | ICD-10-CM | POA: Diagnosis not present

## 2020-12-09 DIAGNOSIS — Z96652 Presence of left artificial knee joint: Secondary | ICD-10-CM | POA: Diagnosis present

## 2020-12-09 DIAGNOSIS — E1142 Type 2 diabetes mellitus with diabetic polyneuropathy: Secondary | ICD-10-CM | POA: Diagnosis not present

## 2020-12-09 DIAGNOSIS — I4891 Unspecified atrial fibrillation: Secondary | ICD-10-CM | POA: Diagnosis present

## 2020-12-09 DIAGNOSIS — R32 Unspecified urinary incontinence: Secondary | ICD-10-CM | POA: Diagnosis present

## 2020-12-09 DIAGNOSIS — M21331 Wrist drop, right wrist: Secondary | ICD-10-CM | POA: Diagnosis present

## 2020-12-09 DIAGNOSIS — M7989 Other specified soft tissue disorders: Secondary | ICD-10-CM | POA: Diagnosis not present

## 2020-12-09 DIAGNOSIS — R1310 Dysphagia, unspecified: Secondary | ICD-10-CM | POA: Diagnosis present

## 2020-12-09 DIAGNOSIS — G4700 Insomnia, unspecified: Secondary | ICD-10-CM | POA: Diagnosis present

## 2020-12-09 DIAGNOSIS — B9620 Unspecified Escherichia coli [E. coli] as the cause of diseases classified elsewhere: Secondary | ICD-10-CM | POA: Diagnosis not present

## 2020-12-09 DIAGNOSIS — I69320 Aphasia following cerebral infarction: Secondary | ICD-10-CM

## 2020-12-09 DIAGNOSIS — Z9049 Acquired absence of other specified parts of digestive tract: Secondary | ICD-10-CM

## 2020-12-09 DIAGNOSIS — K219 Gastro-esophageal reflux disease without esophagitis: Secondary | ICD-10-CM | POA: Diagnosis present

## 2020-12-09 DIAGNOSIS — G8191 Hemiplegia, unspecified affecting right dominant side: Secondary | ICD-10-CM | POA: Diagnosis not present

## 2020-12-09 DIAGNOSIS — I63412 Cerebral infarction due to embolism of left middle cerebral artery: Secondary | ICD-10-CM | POA: Diagnosis not present

## 2020-12-09 DIAGNOSIS — Z713 Dietary counseling and surveillance: Secondary | ICD-10-CM

## 2020-12-09 DIAGNOSIS — M19041 Primary osteoarthritis, right hand: Secondary | ICD-10-CM | POA: Diagnosis present

## 2020-12-09 DIAGNOSIS — N390 Urinary tract infection, site not specified: Secondary | ICD-10-CM | POA: Diagnosis not present

## 2020-12-09 DIAGNOSIS — Z86718 Personal history of other venous thrombosis and embolism: Secondary | ICD-10-CM

## 2020-12-09 DIAGNOSIS — I4821 Permanent atrial fibrillation: Secondary | ICD-10-CM | POA: Diagnosis not present

## 2020-12-09 DIAGNOSIS — I639 Cerebral infarction, unspecified: Secondary | ICD-10-CM | POA: Diagnosis not present

## 2020-12-09 DIAGNOSIS — Z8673 Personal history of transient ischemic attack (TIA), and cerebral infarction without residual deficits: Secondary | ICD-10-CM

## 2020-12-09 DIAGNOSIS — R1312 Dysphagia, oropharyngeal phase: Secondary | ICD-10-CM | POA: Diagnosis not present

## 2020-12-09 DIAGNOSIS — R21 Rash and other nonspecific skin eruption: Secondary | ICD-10-CM | POA: Diagnosis not present

## 2020-12-09 DIAGNOSIS — J9620 Acute and chronic respiratory failure, unspecified whether with hypoxia or hypercapnia: Secondary | ICD-10-CM | POA: Diagnosis not present

## 2020-12-09 DIAGNOSIS — R7989 Other specified abnormal findings of blood chemistry: Secondary | ICD-10-CM | POA: Diagnosis not present

## 2020-12-09 DIAGNOSIS — R69 Illness, unspecified: Secondary | ICD-10-CM | POA: Diagnosis not present

## 2020-12-09 DIAGNOSIS — E039 Hypothyroidism, unspecified: Secondary | ICD-10-CM | POA: Diagnosis present

## 2020-12-09 DIAGNOSIS — Z978 Presence of other specified devices: Secondary | ICD-10-CM | POA: Diagnosis not present

## 2020-12-09 DIAGNOSIS — Z79890 Hormone replacement therapy (postmenopausal): Secondary | ICD-10-CM

## 2020-12-09 DIAGNOSIS — I63512 Cerebral infarction due to unspecified occlusion or stenosis of left middle cerebral artery: Secondary | ICD-10-CM | POA: Diagnosis present

## 2020-12-09 DIAGNOSIS — I82612 Acute embolism and thrombosis of superficial veins of left upper extremity: Secondary | ICD-10-CM | POA: Diagnosis present

## 2020-12-09 DIAGNOSIS — E119 Type 2 diabetes mellitus without complications: Secondary | ICD-10-CM | POA: Diagnosis not present

## 2020-12-09 DIAGNOSIS — I63012 Cerebral infarction due to thrombosis of left vertebral artery: Secondary | ICD-10-CM | POA: Diagnosis not present

## 2020-12-09 DIAGNOSIS — I6602 Occlusion and stenosis of left middle cerebral artery: Secondary | ICD-10-CM | POA: Diagnosis not present

## 2020-12-09 DIAGNOSIS — E034 Atrophy of thyroid (acquired): Secondary | ICD-10-CM | POA: Diagnosis not present

## 2020-12-09 DIAGNOSIS — Z6841 Body Mass Index (BMI) 40.0 and over, adult: Secondary | ICD-10-CM

## 2020-12-09 DIAGNOSIS — D72829 Elevated white blood cell count, unspecified: Secondary | ICD-10-CM | POA: Diagnosis not present

## 2020-12-09 DIAGNOSIS — Z87891 Personal history of nicotine dependence: Secondary | ICD-10-CM

## 2020-12-09 LAB — CBC
HCT: 36.6 % (ref 36.0–46.0)
Hemoglobin: 11.3 g/dL — ABNORMAL LOW (ref 12.0–15.0)
MCH: 28.8 pg (ref 26.0–34.0)
MCHC: 30.9 g/dL (ref 30.0–36.0)
MCV: 93.4 fL (ref 80.0–100.0)
Platelets: 327 10*3/uL (ref 150–400)
RBC: 3.92 MIL/uL (ref 3.87–5.11)
RDW: 13.2 % (ref 11.5–15.5)
WBC: 8 10*3/uL (ref 4.0–10.5)
nRBC: 0 % (ref 0.0–0.2)

## 2020-12-09 LAB — BASIC METABOLIC PANEL
Anion gap: 7 (ref 5–15)
BUN: 32 mg/dL — ABNORMAL HIGH (ref 8–23)
CO2: 31 mmol/L (ref 22–32)
Calcium: 9.8 mg/dL (ref 8.9–10.3)
Chloride: 99 mmol/L (ref 98–111)
Creatinine, Ser: 1.32 mg/dL — ABNORMAL HIGH (ref 0.44–1.00)
GFR, Estimated: 43 mL/min — ABNORMAL LOW (ref >60)
Glucose, Bld: 118 mg/dL — ABNORMAL HIGH (ref 70–99)
Potassium: 4.8 mmol/L (ref 3.5–5.1)
Sodium: 137 mmol/L (ref 135–145)

## 2020-12-09 LAB — GLUCOSE, CAPILLARY
Glucose-Capillary: 115 mg/dL — ABNORMAL HIGH (ref 70–99)
Glucose-Capillary: 119 mg/dL — ABNORMAL HIGH (ref 70–99)
Glucose-Capillary: 155 mg/dL — ABNORMAL HIGH (ref 70–99)
Glucose-Capillary: 173 mg/dL — ABNORMAL HIGH (ref 70–99)

## 2020-12-09 MED ORDER — DIGOXIN 125 MCG PO TABS: 0.1250 mg | ORAL_TABLET | Freq: Every day | ORAL | 0 refills | Status: DC

## 2020-12-09 MED ORDER — METOPROLOL TARTRATE 25 MG PO TABS
25.0000 mg | ORAL_TABLET | Freq: Three times a day (TID) | ORAL | Status: DC
  Administered 2020-12-09 – 2020-12-11 (×5): 25 mg via ORAL
  Filled 2020-12-09 (×5): qty 1

## 2020-12-09 MED ORDER — SENNOSIDES-DOCUSATE SODIUM 8.6-50 MG PO TABS
1.0000 | ORAL_TABLET | Freq: Every evening | ORAL | Status: DC | PRN
  Administered 2020-12-16: 1 via ORAL
  Filled 2020-12-09: qty 1

## 2020-12-09 MED ORDER — DILTIAZEM HCL ER COATED BEADS 120 MG PO CP24
240.0000 mg | ORAL_CAPSULE | Freq: Every day | ORAL | Status: DC
  Administered 2020-12-10 – 2020-12-30 (×21): 240 mg via ORAL
  Filled 2020-12-09 (×21): qty 2

## 2020-12-09 MED ORDER — ROSUVASTATIN CALCIUM 20 MG PO TABS
20.0000 mg | ORAL_TABLET | Freq: Every day | ORAL | Status: DC
  Administered 2020-12-10 – 2020-12-30 (×21): 20 mg via ORAL
  Filled 2020-12-09 (×21): qty 1

## 2020-12-09 MED ORDER — ROSUVASTATIN CALCIUM 20 MG PO TABS: 20.0000 mg | ORAL_TABLET | Freq: Every day | ORAL | 0 refills | Status: DC

## 2020-12-09 MED ORDER — ENSURE ENLIVE PO LIQD: 237.0000 mL | Freq: Two times a day (BID) | ORAL | 12 refills | Status: DC

## 2020-12-09 MED ORDER — SENNOSIDES-DOCUSATE SODIUM 8.6-50 MG PO TABS: 1.0000 | ORAL_TABLET | Freq: Every evening | ORAL | 0 refills | Status: DC | PRN

## 2020-12-09 MED ORDER — ENSURE ENLIVE PO LIQD
237.0000 mL | Freq: Two times a day (BID) | ORAL | Status: DC
  Administered 2020-12-10 – 2020-12-20 (×20): 237 mL via ORAL

## 2020-12-09 MED ORDER — APIXABAN 5 MG PO TABS
5.0000 mg | ORAL_TABLET | Freq: Two times a day (BID) | ORAL | Status: DC
  Administered 2020-12-09 – 2020-12-30 (×42): 5 mg via ORAL
  Filled 2020-12-09 (×42): qty 1

## 2020-12-09 MED ORDER — BLOOD PRESSURE CONTROL BOOK
Freq: Once | Status: AC
  Filled 2020-12-09: qty 1

## 2020-12-09 MED ORDER — ACETAMINOPHEN 650 MG RE SUPP: 650.0000 mg | RECTAL | Status: DC | PRN

## 2020-12-09 MED ORDER — LEVOTHYROXINE SODIUM 100 MCG PO TABS
100.0000 ug | ORAL_TABLET | Freq: Every day | ORAL | Status: DC
  Administered 2020-12-10 – 2020-12-30 (×21): 100 ug via ORAL
  Filled 2020-12-09 (×21): qty 1

## 2020-12-09 MED ORDER — ACETAMINOPHEN 325 MG PO TABS: 650.0000 mg | ORAL_TABLET | ORAL | 0 refills | Status: DC | PRN

## 2020-12-09 MED ORDER — ACETAMINOPHEN 325 MG PO TABS
650.0000 mg | ORAL_TABLET | ORAL | Status: DC | PRN
  Administered 2020-12-09 – 2020-12-29 (×40): 650 mg via ORAL
  Filled 2020-12-09 (×40): qty 2

## 2020-12-09 MED ORDER — APIXABAN 5 MG PO TABS: 5.0000 mg | ORAL_TABLET | Freq: Two times a day (BID) | ORAL | 0 refills | Status: DC

## 2020-12-09 MED ORDER — DILTIAZEM HCL ER COATED BEADS 240 MG PO CP24: 240.0000 mg | ORAL_CAPSULE | Freq: Every day | ORAL | 0 refills | Status: DC

## 2020-12-09 MED ORDER — EXERCISE FOR HEART AND HEALTH BOOK
Freq: Once | Status: AC
  Filled 2020-12-09: qty 1

## 2020-12-09 MED ORDER — LEVALBUTEROL HCL 0.63 MG/3ML IN NEBU: 0.6300 mg | INHALATION_SOLUTION | Freq: Four times a day (QID) | RESPIRATORY_TRACT | 12 refills | Status: DC | PRN

## 2020-12-09 MED ORDER — INSULIN ASPART 100 UNIT/ML IJ SOLN: 0.0000 [IU] | INTRAMUSCULAR | 11 refills | Status: DC

## 2020-12-09 MED ORDER — AMIODARONE HCL 200 MG PO TABS
200.0000 mg | ORAL_TABLET | Freq: Every day | ORAL | Status: DC
  Administered 2020-12-10 – 2020-12-30 (×21): 200 mg via ORAL
  Filled 2020-12-09 (×21): qty 1

## 2020-12-09 MED ORDER — LEVOTHYROXINE SODIUM 100 MCG PO TABS: 100.0000 ug | ORAL_TABLET | Freq: Every day | ORAL | 0 refills | Status: DC

## 2020-12-09 MED ORDER — METOPROLOL TARTRATE 25 MG PO TABS: 25.0000 mg | ORAL_TABLET | Freq: Three times a day (TID) | ORAL | 0 refills | Status: DC

## 2020-12-09 MED ORDER — DIGOXIN 125 MCG PO TABS
0.1250 mg | ORAL_TABLET | Freq: Every day | ORAL | Status: DC
  Administered 2020-12-10 – 2020-12-30 (×21): 0.125 mg via ORAL
  Filled 2020-12-09 (×21): qty 1

## 2020-12-09 MED ORDER — AMIODARONE HCL 200 MG PO TABS: 200.0000 mg | ORAL_TABLET | Freq: Every day | ORAL | 0 refills | Status: DC

## 2020-12-09 MED ORDER — ACETAMINOPHEN 160 MG/5ML PO SOLN: 650.0000 mg | ORAL | Status: DC | PRN

## 2020-12-09 NOTE — Consult Note (Signed)
Ref: Ronita Hipps, MD   Subjective:  Tolerating diltiazem. Atrial fibrillation continues. Awaiting rehab for stroke.  Objective:  Vital Signs in the last 24 hours: Temp:  [97.3 F (36.3 C)-99.1 F (37.3 C)] 97.7 F (36.5 C) (07/28 0816) Pulse Rate:  [76-91] 91 (07/28 0816) Cardiac Rhythm: Atrial fibrillation (07/28 0745) Resp:  [17-20] 18 (07/28 0816) BP: (103-135)/(73-84) 118/80 (07/28 0816) SpO2:  [94 %-99 %] 99 % (07/28 0816) Weight:  [132.5 kg] 132.5 kg (07/28 0414)  Physical Exam: BP Readings from Last 1 Encounters:  12/09/20 118/80     Wt Readings from Last 1 Encounters:  12/09/20 132.5 kg    Weight change: -1.3 kg Body mass index is 45.75 kg/m. HEENT: [place]/AT, Eyes-Blue,  Conjunctiva-Pink, Sclera-Non-icteric Neck: No JVD, No bruit, Trachea midline. Lungs:  Clear, Bilateral. Cardiac:  Irregular rhythm, normal S1 and S2, no S3. II/VI systolic murmur. Abdomen:  Soft, non-tender. BS present. Extremities:  No edema present. No cyanosis. No clubbing. CNS: AxOx3, Cranial nerves grossly intact, moves all 4 extremities. Right sided weakness. Speech is thick and improving Skin: Warm and dry.   Intake/Output from previous day: 07/27 0701 - 07/28 0700 In: 237 [P.O.:237] Out: 1100 [Urine:1100]    Lab Results: BMET    Component Value Date/Time   NA 137 12/09/2020 0327   NA 139 12/08/2020 0311   NA 139 12/07/2020 0211   NA 142 06/15/2017 0930   K 4.8 12/09/2020 0327   K 4.3 12/08/2020 0311   K 4.1 12/07/2020 0211   CL 99 12/09/2020 0327   CL 103 12/08/2020 0311   CL 103 12/07/2020 0211   CO2 31 12/09/2020 0327   CO2 28 12/08/2020 0311   CO2 28 12/07/2020 0211   GLUCOSE 118 (H) 12/09/2020 0327   GLUCOSE 109 (H) 12/08/2020 0311   GLUCOSE 102 (H) 12/07/2020 0211   BUN 32 (H) 12/09/2020 0327   BUN 35 (H) 12/08/2020 0311   BUN 36 (H) 12/07/2020 0211   BUN 19 06/15/2017 0930   CREATININE 1.32 (H) 12/09/2020 0327   CREATININE 1.06 (H) 12/08/2020 0311    CREATININE 1.04 (H) 12/07/2020 0211   CALCIUM 9.8 12/09/2020 0327   CALCIUM 9.5 12/08/2020 0311   CALCIUM 9.5 12/07/2020 0211   GFRNONAA 43 (L) 12/09/2020 0327   GFRNONAA 56 (L) 12/08/2020 0311   GFRNONAA 57 (L) 12/07/2020 0211   GFRAA 68 06/15/2017 0930   GFRAA 64 (L) 08/28/2013 0429   GFRAA 77 (L) 08/27/2013 0456   CBC    Component Value Date/Time   WBC 8.0 12/09/2020 0327   RBC 3.92 12/09/2020 0327   HGB 11.3 (L) 12/09/2020 0327   HGB 12.9 06/15/2017 0930   HCT 36.6 12/09/2020 0327   HCT 38.6 06/15/2017 0930   PLT 327 12/09/2020 0327   PLT 265 06/15/2017 0930   MCV 93.4 12/09/2020 0327   MCV 86 06/15/2017 0930   MCH 28.8 12/09/2020 0327   MCHC 30.9 12/09/2020 0327   RDW 13.2 12/09/2020 0327   RDW 14.5 06/15/2017 0930   LYMPHSABS 1.0 12/03/2020 0329   MONOABS 0.7 12/03/2020 0329   EOSABS 0.0 12/03/2020 0329   BASOSABS 0.0 12/03/2020 0329   HEPATIC Function Panel Recent Labs    12/03/20 0329  PROT 5.9*   HEMOGLOBIN A1C No components found for: HGA1C,  MPG CARDIAC ENZYMES No results found for: CKTOTAL, CKMB, CKMBINDEX, TROPONINI BNP No results for input(s): PROBNP in the last 8760 hours. TSH No results for input(s): TSH in the last  8760 hours. CHOLESTEROL Recent Labs    12/03/20 0330  CHOL 142    Scheduled Meds:   stroke: mapping our early stages of recovery book   Does not apply Once   amiodarone  200 mg Oral Daily   apixaban  5 mg Oral BID   chlorhexidine gluconate (MEDLINE KIT)  15 mL Mouth Rinse BID   Chlorhexidine Gluconate Cloth  6 each Topical Daily   digoxin  0.125 mg Oral Daily   diltiazem  240 mg Oral Daily   feeding supplement  237 mL Oral BID BM   insulin aspart  0-15 Units Subcutaneous Q4H   levothyroxine  100 mcg Oral Q0600   metoprolol tartrate  25 mg Oral TID   rosuvastatin  20 mg Oral Daily   Continuous Infusions: PRN Meds:.acetaminophen **OR** acetaminophen (TYLENOL) oral liquid 160 mg/5 mL **OR** acetaminophen, levalbuterol,  senna-docusate  Assessment/Plan:  Acute stroke Chronic atrial fibrillation CKD, II Type 2 DM Morbid obesity  Plan: Continue medical treatment.   LOS: 7 days   Time spent including chart review, lab review, examination, discussion with patient/Nurse : 30 min   Dixie Dials  MD  12/09/2020, 9:03 AM

## 2020-12-09 NOTE — Progress Notes (Signed)
Inpatient Rehabilitation  Patient information reviewed and entered into eRehab system by Dajae Kizer M. Jalexia Lalli, M.A., CCC/SLP, PPS Coordinator.  Information including medical coding, functional ability and quality indicators will be reviewed and updated through discharge.    

## 2020-12-09 NOTE — Progress Notes (Signed)
Inpatient Rehabilitation Medication Review by a Pharmacist  A complete drug regimen review was completed for this patient to identify any potential clinically significant medication issues.  Clinically significant medication issues were identified:  no  Check AMION for pharmacist assigned to patient if future medication questions/issues arise during this admission.  Pharmacist comments:   Time spent performing this drug regimen review (minutes):  10   Ramond Craver 12/09/2020 5:35 PM

## 2020-12-09 NOTE — Progress Notes (Signed)
STROKE TEAM PROGRESS NOTE   INTERVAL HISTORY Patient in bed, NAD. Will d/c to CIR  Vitals:   12/08/20 2004 12/09/20 0003 12/09/20 0414 12/09/20 0816  BP: 117/73 103/84 135/83 118/80  Pulse: 76 88 79 91  Resp: '20 19 17 18  '$ Temp: 98.2 F (36.8 C) 98.3 F (36.8 C) 99.1 F (37.3 C) 97.7 F (36.5 C)  TempSrc: Oral Oral Oral Oral  SpO2: 97% 99% 97% 99%  Weight:   132.5 kg   Height:       CBC:  Recent Labs  Lab 12/03/20 0329 12/04/20 1301 12/08/20 0311 12/09/20 0327  WBC 10.7*   < > 8.4 8.0  NEUTROABS 8.9*  --   --   --   HGB 10.4*   < > 10.8* 11.3*  HCT 32.2*   < > 34.6* 36.6  MCV 93.9   < > 93.3 93.4  PLT 229   < > 294 327   < > = values in this interval not displayed.   Basic Metabolic Panel:  Recent Labs  Lab 12/03/20 0242 12/03/20 0329 12/05/20 0635 12/06/20 0817 12/08/20 0311 12/09/20 0327  NA  --    < > 141   < > 139 137  K  --    < > 3.9   < > 4.3 4.8  CL  --    < > 104   < > 103 99  CO2  --    < > 28   < > 28 31  GLUCOSE  --    < > 121*   < > 109* 118*  BUN  --    < > 32*   < > 35* 32*  CREATININE  --    < > 1.07*   < > 1.06* 1.32*  CALCIUM  --    < > 8.6*   < > 9.5 9.8  MG 2.2  --  2.1  --   --   --    < > = values in this interval not displayed.   Lipid Panel:  Recent Labs  Lab 12/03/20 0330  CHOL 142  TRIG 107  100  HDL 40*  CHOLHDL 3.6  VLDL 21  LDLCALC 81   HgbA1c:  Recent Labs  Lab 12/03/20 0330  HGBA1C 6.2*   Urine Drug Screen:  Recent Labs  Lab 12/02/20 1840  LABOPIA NONE DETECTED  COCAINSCRNUR NONE DETECTED  LABBENZ NONE DETECTED  AMPHETMU NONE DETECTED  THCU NONE DETECTED  LABBARB NONE DETECTED    MR Brain WO Contrast 12/02/20 IMPRESSION: Small to moderate acute cortical infarct in the left frontal parietal cortex primarily in the postcentral gyrus. No associated hemorrhage.  PHYSICAL EXAM  Temp:  [97.3 F (36.3 C)-99.1 F (37.3 C)] 97.7 F (36.5 C) (07/28 0816) Pulse Rate:  [76-91] 91 (07/28 0816) Resp:   [17-20] 18 (07/28 0816) BP: (103-135)/(73-84) 118/80 (07/28 0816) SpO2:  [94 %-99 %] 99 % (07/28 0816) Weight:  [132.5 kg] 132.5 kg (07/28 0414)  General - Well nourished, well developed, not in acute distress.  Ophthalmologic - fundi not visualized due to noncooperation.  Cardiovascular - irregularly irregular heart rate and rhythm.  Neuro - awake, alert, eyes open, able to answer orientation questions but please significant paraphasic errors. Difficulty with spontaneous speech, but able to repeat simple sentence but not complex sentences, naming 2/4, following simple commands. No gaze palsy, tracking bilaterally, visual field full, PERRL. Mild right facial droop. Tongue midline. LUE 4/5 and RUE 3-/5 proximal  and distal. Bilateral LEs 4/5, no drift. Sensation symmetrical bilaterally, left FTN intact although slow, gait not tested.    ASSESSMENT/PLAN [name] is a 70 y.o. female with history of hypertension, hyperlipidemia, DVT hx, and prior TIA who presented as a code stroke from [facility] after being found with right sided weakness and aphasia earlier today while admitted for afib RVR. CTA revealed a left MCA LVO. tPA was not given as she was on eliquis. She was taken for thrombectomy.  Stroke - left MCA infarct due to left MCA occlusion s/p thrombectomy with  TICI2c, etiology likely due to newly diagnosed A. fib CT no acute finding CT head and neck left MCA occlusion S/p IR with TICI2c revascularization MRI 12/02/20 - Small to moderate acute cortical infarct in the left frontal parietal cortex primarily in the postcentral gyrus. No associated hemorrhage. Echocardiogram EF 45 to 50% LDL 81 HgbA1c 6.2 DVT prophylaxis Heparin IV Plavix PTA,now on eliquis Therapy recommendations - CIR Disposition: CIR 12/09/20  Afib RVR, new diagnosis Cardiology on board - appreciate assistance On digoxin 0.125 mg daily Continue metoprolol to 25 mg 3 times daily Continue cardizem to  '60mg'$  Q6h Rate largely controlled now Continue eliquis.  History of TIA 05/2017 right arm numbness and weakness lasting for minutes.  Follows with Dr. Evelena Perez at [facility]  In 07/2017 EF 55 to 60%, carotid Doppler showed left ICA 40 to 59% stenosis.  MRI negative for acute stroke.  Hyperlipidemia No statin at home LDL 81, goal < 70 Start crestor '20mg'$  daily Continue statin on discharge  HTN BP stable On metoprolol and diltizam per cardiology BP monitoring  Dysphagia Passed swallow On dys2 and thin  DC core track Speech on board  Other stroke risk factors Morbid obesity, BMI 46.27  Other acute issues Anemia of chronic disease, Hb 10.4 ->11.0->11.1->10.9->10.8 Leukocytosis WBC 10.7 ->11.5->12.8->10.4->9.5->8.4 (afebrile) AKI creatinine 1.17-> 0.92->1.04->1.03->1.06  Hospital day # [place], MSN, NP-C [facility] [phone]       To contact Stroke Continuity provider, please refer to [email or web address] After hours, contact General Neurology

## 2020-12-09 NOTE — Plan of Care (Signed)
  Problem: Education: Goal: Knowledge of disease or condition will improve Outcome: Progressing Goal: Knowledge of secondary prevention will improve Outcome: Progressing Goal: Knowledge of patient specific risk factors addressed and post discharge goals established will improve Outcome: Progressing Goal: Individualized Educational Video(s) Outcome: Progressing   Problem: Coping: Goal: Will verbalize positive feelings about self Outcome: Progressing Goal: Will identify appropriate support needs Outcome: Progressing   Problem: Ischemic Stroke/TIA Tissue Perfusion: Goal: Complications of ischemic stroke/TIA will be minimized Outcome: Progressing   Problem: Intracerebral Hemorrhage Tissue Perfusion: Goal: Complications of Intracerebral Hemorrhage will be minimized Outcome: Progressing   Problem: Spontaneous Subarachnoid Hemorrhage Tissue Perfusion: Goal: Complications of Spontaneous Subarachnoid Hemorrhage will be minimized Outcome: Progressing

## 2020-12-09 NOTE — Progress Notes (Signed)
Courtney Heys, MD   Physician  Nursing  PMR Pre-admission      Signed  Date of Service:  12/07/2020  1:45 PM       Related encounter: ED to Hosp-Admission (Current) from 12/02/2020 in [facility]       Signed                                                                                                                                                                                                                                                                                                                                                                                                                                                PMR Admission Coordinator Pre-Admission Assessment   Patient: Candice Perez is an 70 y.o., female MRN: [id number] DOB: [birth year] Height: 5' 7"  (170.2 cm) Weight: 132.5 kg   Insurance Information HMO:    PPO:  yes    PCP:      IPA:      80/20:      OTHER: PRIMARY: [employer] Medicare      Policy#: [id number]      Subscriber: Patient CM Name: Marcene Brawn      Phone#: [phone]     Fax#: [phone] I received a call from Ronda at [facility] and she stated pt. Was authorizes for CIR admit from 7/27-8/2 with clinical  updates due 8/2 Pre-Cert#: [id number]      Employer: Benefits:  Phone #:ax: [phone] Phone: [phone] Eff Date: 05/15/2020 - still active Deductible: does not have one OOP Max: $4,500 ($25 met) CIR: $295/day co-pay with a max co-pay of $1,770/admission (6 days) SNF: $0/day co-pay for days 1-20, $188/day co-pay for days 21-100; limited to 100 days/cal yr. Outpatient:  $35/visit co-pay Home Health:  100% coverage DME: 80% coverage; 20% co-insurance Providers: in-network  SECONDARY:       Policy#:      Phone#:    Information systems manager" for  patients in Inpatient Rehabilitation Facilities with attached "Privacy Act [place] Records" was provided and verbally reviewed with: Patient   Emergency Contact Information Contact Information       Name Relation Home Work Mobile    Alpert,Howard Spouse [phone]   [phone]    Anarie, Kalish     [phone]           Current Medical History  Patient Admitting Diagnosis: Left MCA stroke History of Present Illness: Pt is a 70 year old female with medical hx significant for: HTN, depression, GERD, DVT, stroke, TIA, HLD. Pt presented to [facility] on 12/02/20 d/t new onset of A-fib with RVR. Shortly after arrival, pt developed acute onset aphasia and right-sided weakness. Code stroke activated. CT scan revealed area concerning left MCA with large volume occlusion. tPA was not administered. Pt transferred to [facility] for ongoing treatment.  After arrival, pt underwent revascularization of left MCA with clot extraction.   Complete NIHSS TOTAL: 9   Patient's medical record from [facility] has been reviewed by the rehabilitation admission coordinator and physician.   Past Medical History      Past Medical History:  Diagnosis Date   Arthritis     DVT (deep venous thrombosis) (HCC)     Edema     GERD (gastroesophageal reflux disease)      OCCASIONAL - PRILOSEC IF NEEDED   Hyperlipidemia     Hypertension      no meds now   Hypothyroidism     Varicose veins        Family History   family history includes Cancer in her father and mother; Congestive Heart Failure in her mother; Other in her father.   Prior Rehab/Hospitalizations Has the patient had prior rehab or hospitalizations prior to admission? No   Has the patient had major surgery during 100 days prior to admission? Yes              Current Medications   Current Facility-Administered Medications:    stroke: mapping our early stages of recovery book, , Does not apply, Once,  Vonzella Nipple, NP   acetaminophen (TYLENOL) tablet 650 mg, 650 mg, Oral, Q4H PRN, 650 mg at 12/08/20 1333 **OR** acetaminophen (TYLENOL) 160 MG/5ML solution 650 mg, 650 mg, Per Tube, Q4H PRN, 650 mg at 12/06/20 2014 **OR** acetaminophen (TYLENOL) suppository 650 mg, 650 mg, Rectal, Q4H PRN, Vonzella Nipple, NP   amiodarone (PACERONE) tablet 200 mg, 200 mg, Oral, Daily, Beldon, Brianna S, RPH, 200 mg at 12/09/20 0849   apixaban (ELIQUIS) tablet 5 mg, 5 mg, Oral, BID, Williams, Jessica N, NP, 5 mg at 12/09/20 0850   chlorhexidine gluconate (MEDLINE KIT) (PERIDEX) 0.12 % solution 15 mL, 15 mL, Mouth Rinse, BID, Laurey Morale N, NP, 15 mL at 12/09/20 0850   Chlorhexidine Gluconate Cloth 2 % PADS 6 each, 6 each, Topical, Daily, Jimmye Norman,  Nita Sells, NP, 6 each at 12/09/20 0850   digoxin (LANOXIN) tablet 0.125 mg, 0.125 mg, Oral, Daily, Williams, Jessica N, NP, 0.125 mg at 12/09/20 0850   diltiazem (CARDIZEM CD) 24 hr capsule 240 mg, 240 mg, Oral, Daily, Dixie Dials, MD, 240 mg at 12/09/20 0849   feeding supplement (ENSURE ENLIVE / ENSURE PLUS) liquid 237 mL, 237 mL, Oral, BID BM, Williams, Jessica N, NP, 237 mL at 12/09/20 0852   insulin aspart (novoLOG) injection 0-15 Units, 0-15 Units, Subcutaneous, Q4H, Vonzella Nipple, NP, 3 Units at 12/09/20 0427   levalbuterol (XOPENEX) nebulizer solution 0.63 mg, 0.63 mg, Nebulization, Q6H PRN, Vonzella Nipple, NP   levothyroxine (SYNTHROID) tablet 100 mcg, 100 mcg, Oral, Q0600, Liz Beach, RPH, 100 mcg at 12/09/20 0631   metoprolol tartrate (LOPRESSOR) tablet 25 mg, 25 mg, Oral, TID, Beldon, Brianna S, RPH, 25 mg at 12/09/20 0850   rosuvastatin (CRESTOR) tablet 20 mg, 20 mg, Oral, Daily, Beldon, Brianna S, RPH, 20 mg at 12/09/20 0849   senna-docusate (Senokot-S) tablet 1 tablet, 1 tablet, Oral, QHS PRN, Vonzella Nipple, NP   Patients Current Diet:  Diet Order                  DIET DYS 2 Room service appropriate? Yes; Fluid  consistency: Thin  Diet effective now                         Precautions / Restrictions Precautions Precautions: Fall Precaution Comments: HHFNC, watch HR Restrictions Weight Bearing Restrictions: No    Has the patient had 2 or more falls or a fall with injury in the past year? No   Prior Activity Level Limited Community (1-2x/wk): drives, gets out of house 2-3 days/week   Prior Functional Level Self Care: Did the patient need help bathing, dressing, using the toilet or eating? Independent   Indoor Mobility: Did the patient need assistance with walking from room to room (with or without device)? Independent   Stairs: Did the patient need assistance with internal or external stairs (with or without device)? Independent   Functional Cognition: Did the patient need help planning regular tasks such as shopping or remembering to take medications? Independent   Home Assistive Devices / Equipment Home Assistive Devices/Equipment: None Home Equipment: Crutches, Hand held shower head   Prior Device Use: Indicate devices/aids used by the patient prior to current illness, exacerbation or injury? None of the above   Current Functional Level Cognition   Arousal/Alertness: Awake/alert Overall Cognitive Status: Impaired/Different from baseline Orientation Level: Oriented to person, Oriented to place Following Commands: Follows multi-step commands with increased time, Follows one step commands consistently Safety/Judgement: Decreased awareness of deficits General Comments: pt able to follow commands consistently, aware of deficits including speech recommendations, incresaed time to process Attention: Sustained Sustained Attention: Appears intact (suspect deficits with alterating and divided attention) Memory:  (will assess further) Awareness: Impaired Awareness Impairment: Emergent impairment, Anticipatory impairment Problem Solving: Appears intact (for basic) Safety/Judgment:  Other (comment) (prognosis good)    Extremity Assessment (includes Sensation/Coordination)   Upper Extremity Assessment: RUE deficits/detail RUE Deficits / Details: noted to have decrease digit activation and decreased wrist extension. pt activation at elbow shoulder scapula. pt with IV at the wrist and blistered rash area on forearm that could prevent any splinting for night attempts at this time RUE Sensation: decreased light touch RUE Coordination: decreased fine motor, decreased gross motor  Lower Extremity Assessment: Defer to  PT evaluation     ADLs   Overall ADL's : Needs assistance/impaired Eating/Feeding: NPO Grooming: Oral care, Moderate assistance, Standing Grooming Details (indicate cue type and reason): needs (A) to apply paste and completes task with decreased time than needed. pt does completed sequence correctly Lower Body Bathing: Maximal assistance General ADL Comments: pt with purewick in place due to help with incontinence. pt output of > 300 CC     Mobility   Overal bed mobility: Modified Independent Bed Mobility: Supine to Sit Rolling: Max assist, +2 for physical assistance Supine to sit: Modified independent (Device/Increase time) General bed mobility comments: Patient was able to get herself seated at edge of bed using bed rails with supervision. increased time and effort to perform.     Transfers   Overall transfer level: Needs assistance Equipment used: Hemi-walker Transfer via Lift Equipment: Stedy Transfers: Sit to/from Stand Sit to Stand: Min assist, +2 physical assistance Stand pivot transfers: Mod assist General transfer comment: min assist +2 for sit to stand transfers from bed and recliner. Cues initially to come forward     Ambulation / Gait / Stairs / Wheelchair Mobility   Ambulation/Gait Ambulation/Gait assistance: Mod assist, +2 physical assistance, +2 safety/equipment Gait Distance (Feet): 4 Feet Assistive device: Hemi-walker Gait  Pattern/deviations: Step-to pattern, Decreased step length - right, Decreased step length - left, Trunk flexed, Shuffle General Gait Details: able to take a few steps from bed to recliner, mod leaning to right with weight shifting. Cues needed throughout for sequencing. Gait velocity: decr     Posture / Balance Balance Overall balance assessment: Needs assistance Sitting-balance support: Feet supported Sitting balance-Leahy Scale: Good Standing balance support: Single extremity supported, During functional activity Standing balance-Leahy Scale: Fair Standing balance comment: Patient demonstrated no knee buckling this session. Dependent on signle UE support and mod assist     Special needs/care consideration Skin Right groin puncture wound    Previous Home Environment (from acute therapy documentation) Living Arrangements: Spouse/significant other  Lives With: Spouse Available Help at Discharge: Family, Available 24 hours/day Type of Home: House Home Layout: One level Home Access: Level entry Bathroom Shower/Tub: Gaffer, Chiropodist: Handicapped height Bathroom Accessibility: Yes How Accessible: Accessible via walker [place]: No Additional Comments: no pets   Discharge Living Setting Plans for Discharge Living Setting: Patient's home Type of Home at Discharge: House Discharge Home Layout: One level Discharge Home Access: Level entry Discharge Bathroom Shower/Tub: Walk-in shower, Parkman unit Discharge Bathroom Toilet: Handicapped height Discharge Bathroom Accessibility: Yes How Accessible: Accessible via walker Does the patient have any problems obtaining your medications?: No   Social/Family/Support Systems Anticipated Caregiver: Arlyce Dice, husband and son Anticipated Caregiver's Contact Information: [phone] Caregiver Availability: 24/7 Discharge Plan Discussed with Primary Caregiver: Yes Is Caregiver In Agreement with  Plan?: Yes Does Caregiver/Family have Issues with Lodging/Transportation while Pt is in Rehab?: No   Goals Patient/Family Goal for Rehab: PT/OT/SLP Supervision Expected length of stay: 14-16 days Pt/Family Agrees to Admission and willing to participate: Yes Program Orientation Provided & Reviewed with Pt/Caregiver Including Roles  & Responsibilities: Yes   Decrease burden of Care through IP rehab admission: Specialzed equipment needs, Decrease number of caregivers, Bowel and bladder program, and Patient/family education   Possible need for SNF placement upon discharge: not anticipated    Patient Condition: I have reviewed medical records from [facility], spoken with CM, and patient and family member. I met with patient at the bedside for inpatient  rehabilitation assessment.  Patient will benefit from ongoing PT, OT, and SLP, can actively participate in 3 hours of therapy a day 5 days of the week, and can make measurable gains during the admission.  Patient will also benefit from the coordinated team approach during an Inpatient Acute Rehabilitation admission.  The patient will receive intensive therapy as well as Rehabilitation physician, nursing, social worker, and care management interventions.  Due to safety, skin/wound care, disease management, medication administration, pain management, and patient education the patient requires 24 hour a day rehabilitation nursing.  The patient is currently mod assist +2 to max assist with mobility and basic ADLs.  Discharge setting and therapy post discharge at home with home health is anticipated.  Patient has agreed to participate in the Acute Inpatient Rehabilitation Program and will admit today.   Preadmission Screen Completed By:  Retta Diones, 12/09/2020 11:13 AM ______________________________________________________________________   Discussed status with Dr.  Dagoberto Ligas on  12/09/20 at 0930 and received approval for admission today.    Admission Coordinator:  Retta Diones, RN, time 1105/Date 12/09/20 and updated by Karene Fry, RN at 1105 on 12/09/20    Assessment/Plan: Diagnosis: Does the need for close, 24 hr/day Medical supervision in concert with the patient's rehab needs make it unreasonable for this patient to be served in a less intensive setting? Yes Co-Morbidities requiring supervision/potential complications: new Afib with RVR, L MCA stroke s/p thrombectomy; hx of DVT, HTN, GERD, depression, prior TIA/CVA; HLD Due to bladder management, bowel management, safety, skin/wound care, disease management, medication administration, and patient education, does the patient require 24 hr/day rehab nursing? Yes Does the patient require coordinated care of a physician, rehab nurse, PT, OT, and SLP to address physical and functional deficits in the context of the above medical diagnosis(es)? Yes Addressing deficits in the following areas: balance, endurance, locomotion, strength, transferring, bowel/bladder control, bathing, dressing, feeding, grooming, toileting, cognition, speech, language, and swallowing Can the patient actively participate in an intensive therapy program of at least 3 hrs of therapy 5 days a week? Yes The potential for patient to make measurable gains while on inpatient rehab is good Anticipated functional outcomes upon discharge from inpatient rehab: supervision and min assist PT, supervision and min assist OT, supervision and min assist SLP Estimated rehab length of stay to reach the above functional goals is: 14-16 days Anticipated discharge destination: Home 10. Overall Rehab/Functional Prognosis: good     MD Signature:            Revision History                                                      Routing History              Note Details  Jan Fireman, MD File Time 12/09/2020 12:20 PM  Author Type Physician Status Signed  Last Editor Courtney Heys, MD Service  Nursing

## 2020-12-09 NOTE — Discharge Instructions (Signed)

## 2020-12-09 NOTE — Progress Notes (Signed)
Physical Therapy Treatment Patient Details Name: Candice Perez MRN: [id number] DOB: 1950/12/27 Today's Date: 12/09/2020    History of Present Illness The pt is a 70 yo female presenting to [facility] on 7/21 due to new onset afib with RVR, but required transfer to [facility] from [place] via EMS on 7/21 after acute onset aphasia and R sided weakness. The pt is now s/p revascularization of L MCA on 7/21. PMH includes: HTN, HLD, TIA, stoke, DVT, depression, and GERD.    PT Comments    Patient received in bed, agreeable to PT session. Patient is mod independent with bed mobility, heavy use of rail, increased time. Patient is able to scoot to edge of bed with bed rail and supervision. Sit to stand performed with min +2 assist and cues. Patient able to take a few steps to recliner with HW and min/mod +2 assist. She is making excellent progress and will continue to benefit from skilled PT while here to improve independence and strength.      Follow Up Recommendations  CIR     Equipment Recommendations  Other (comment) (TBD)    Recommendations for Other Services Rehab consult     Precautions / Restrictions Precautions Precautions: Fall Restrictions Weight Bearing Restrictions: No    Mobility  Bed Mobility Overal bed mobility: Modified Independent Bed Mobility: Supine to Sit     Supine to sit: Modified independent (Device/Increase time)     General bed mobility comments: Patient was able to get herself seated at edge of bed using bed rails with supervision. increased time and effort to perform.    Transfers Overall transfer level: Needs assistance Equipment used: Hemi-walker Transfers: Sit to/from Stand Sit to Stand: Min assist;+2 physical assistance         General transfer comment: min assist +2 for sit to stand transfers from bed and recliner. Cues initially to come forward  Ambulation/Gait Ambulation/Gait assistance: Mod assist;+2 physical assistance;+2  safety/equipment Gait Distance (Feet): 4 Feet Assistive device: Hemi-walker Gait Pattern/deviations: Step-to pattern;Decreased step length - right;Decreased step length - left;Trunk flexed;Shuffle Gait velocity: decr   General Gait Details: able to take a few steps from bed to recliner, mod leaning to right with weight shifting. Cues needed throughout for sequencing.   Stairs             Wheelchair Mobility    Modified Rankin (Stroke Patients Only) Modified Rankin (Stroke Patients Only) Pre-Morbid Rankin Score: No symptoms Modified Rankin: Moderately severe disability     Balance Overall balance assessment: Needs assistance Sitting-balance support: Feet supported Sitting balance-Leahy Scale: Good     Standing balance support: Single extremity supported;During functional activity Standing balance-Leahy Scale: Fair Standing balance comment: Patient demonstrated no knee buckling this session. Dependent on signle UE support and mod assist                            Cognition Arousal/Alertness: Awake/alert Behavior During Therapy: WFL for tasks assessed/performed Overall Cognitive Status: Impaired/Different from baseline Area of Impairment: Problem solving;Awareness                       Following Commands: Follows multi-step commands with increased time;Follows one step commands consistently Safety/Judgement: Decreased awareness of deficits Awareness: Emergent Problem Solving: Difficulty sequencing;Requires verbal cues;Requires tactile cues General Comments: pt able to follow commands consistently, aware of deficits including speech recommendations, incresaed time to process      Exercises Other Exercises  Other Exercises: seated LAQ, marching, AP, hip abd/add x 10-15 reps bilaterally.  STS from recliner with cues and min/mod +2 assist. leaning to right when descending back into recliner    General Comments        Pertinent Vitals/Pain Pain  Assessment: No/denies pain    Home Living                      Prior Function            PT Goals (current goals can now be found in the care plan section) Acute Rehab PT Goals Patient Stated Goal: to go to rehab PT Goal Formulation: With patient Time For Goal Achievement: 12/18/20 Potential to Achieve Goals: Good Progress towards PT goals: Progressing toward goals    Frequency           PT Plan Current plan remains appropriate    Co-evaluation              AM-PAC PT "6 Clicks" Mobility   Outcome Measure  Help needed turning from your back to your side while in a flat bed without using bedrails?: A Little Help needed moving from lying on your back to sitting on the side of a flat bed without using bedrails?: A Little Help needed moving to and from a bed to a chair (including a wheelchair)?: A Lot Help needed standing up from a chair using your arms (e.g., wheelchair or bedside chair)?: A Lot Help needed to walk in hospital room?: A Lot Help needed climbing 3-5 steps with a railing? : Total 6 Click Score: 13    End of Session Equipment Utilized During Treatment: Gait belt Activity Tolerance: Patient tolerated treatment well Patient left: in chair;with call bell/phone within reach Nurse Communication: Mobility status;Other (comment) (left patient on room air with saturations at 95%) PT Visit Diagnosis: Other abnormalities of gait and mobility (R26.89);Muscle weakness (generalized) (M62.81);Difficulty in walking, not elsewhere classified (R26.2);Hemiplegia and hemiparesis Hemiplegia - Right/Left: Right Hemiplegia - dominant/non-dominant: Dominant Hemiplegia - caused by: Cerebral infarction     Time: [id number] PT Time Calculation (min) (ACUTE ONLY): 24 min  Charges:  $Gait Training: 8-22 mins $Therapeutic Exercise: 8-22 mins                    Aniyia Rane, PT, GCS 12/09/20,11:10 AM

## 2020-12-09 NOTE — H&P (Signed)
Physical Medicine and Rehabilitation Admission H&P     HPI: Candice Perez is a 70 year old right-handed female with history of hypertension, hyperlipidemia, prior TIA maintained on Plavix 05/2017, history of DVT, quit smoking approximately 10 years ago.  Per chart review patient lives with spouse.  1 level home with level entry.  Independent prior to admission and active.  Presented to [facility] 12/02/2020 with new onset atrial fibrillation with RVR and placed on Eliquis.  Patient with acute onset right side weakness and aphasia in the ED code stroke was activated and CTA revealed LVO with occlusion of the superior division of the left middle cerebral artery.  She was transferred to [facility] for emergent mechanical thrombectomy per interventional radiology.  Follow-up MRI of the brain showed small to moderate acute cortical infarct in the left frontal parietal cortex primarily in the postcentral gyrus.  No associated hemorrhage.  Cardiology service follow-up for atrial fibrillation RVR with latest echocardiogram ejection fraction 40 to [id number] grade 1 diastolic dysfunction.  The left ventricle demonstrating global hypokinesis.  Patient currently remains on Eliquis for CVA prophylaxis as well as atrial fibrillation.  Amiodarone ongoing for atrial fibrillation as well as Lanoxin with Cardizem.  Upper extremity Dopplers negative for DVT.  Hospital course mild AKI 1.32 and monitored.  Initially with nasogastric tube feeds for nutritional support her diet has been advanced to a dysphagia #2 thin liquid diet.  Therapy evaluations completed due to patient's right side weakness and aphasia was admitted for a comprehensive rehab program.   Pt reports with multiples cues that having pain in R hand greater than L hand- due to arthritis- is chronic- pain meds helping- initially too sedating, but  new dosing is better.   Said the pain is "bad".  Doesn't remember when had LBM, but denies  constipation. Thinks it's been since admitted.  Has control of urine per pt, but has been using Purewick.  Tolerating D2 thin diet.  Michela Pitcher has difficulty coming up with words (with cues), but understands everything said.    Review of Systems  Constitutional:  Negative for chills and fever.  HENT:  Negative for hearing loss.   Eyes:  Negative for blurred vision, double vision and photophobia.  Respiratory:  Negative for cough and shortness of breath.   Cardiovascular:  Positive for palpitations. Negative for chest pain and leg swelling.  Gastrointestinal:  Positive for constipation. Negative for heartburn, nausea and vomiting.       GERD  Genitourinary:  Negative for dysuria, flank pain and hematuria.  Musculoskeletal:  Positive for myalgias.  Skin:  Negative for rash.  Neurological:  Positive for speech change and weakness.  All other systems reviewed and are negative. Past Medical History:  Diagnosis Date   Arthritis    DVT (deep venous thrombosis) (HCC)    Edema    GERD (gastroesophageal reflux disease)    OCCASIONAL - PRILOSEC IF NEEDED   Hyperlipidemia    Hypertension    no meds now   Hypothyroidism    Varicose veins    Past Surgical History:  Procedure Laterality Date   CHOLECYSTECTOMY     Laparoscopic   CYSTOCELE REPAIR     HERNIA REPAIR     IR CT HEAD LTD  12/02/2020   IR PERCUTANEOUS ART THROMBECTOMY/INFUSION INTRACRANIAL INC DIAG ANGIO  12/02/2020   RADIOLOGY WITH ANESTHESIA N/A 12/02/2020   Procedure: IR WITH ANESTHESIA;  Surgeon: Luanne Bras, MD;  Location: [facility];  Service: Radiology;  Laterality:  N/A;   TONSILLECTOMY     TOTAL KNEE ARTHROPLASTY Left 08/26/2013   Procedure: LEFT TOTAL KNEE REVISION;  Surgeon: Mauri Pole, MD;  Location: [facility];  Service: Orthopedics;  Laterality: Left;   VARICOSE VEIN SURGERY     Family History  Problem Relation Age of Onset   Congestive Heart Failure Mother    Cancer Mother        tongue/sinus   Cancer Father         "in his back"   Other Father        Varicose veins, Arteriosclerosis   Social History:  reports that she quit smoking about 10 years ago. Her smoking use included cigarettes. She has never used smokeless tobacco. She reports that she does not drink alcohol and does not use drugs. Allergies: No Known Allergies Medications Prior to Admission  Medication Sig Dispense Refill   amLODipine-valsartan (EXFORGE) 5-320 MG tablet Take 1 tablet by mouth daily.     ascorbic acid (VITAMIN C) 250 MG CHEW Chew 500 mg by mouth daily.     Biotin w/ Vitamins C & E (HAIR/SKIN/NAILS PO) Take 1 tablet by mouth daily.     buPROPion (WELLBUTRIN XL) 150 MG 24 hr tablet Take 150 mg by mouth daily.     Cholecalciferol (VITAMIN D3) 125 MCG (5000 UT) CAPS Take 1 capsule by mouth daily.     clopidogrel (PLAVIX) 75 MG tablet Take 1 tablet (75 mg total) by mouth daily. 90 tablet 3   Coenzyme Q10 (CO Q-10) 100 MG CAPS Take 1 tablet by mouth daily.     levothyroxine (SYNTHROID, LEVOTHROID) 100 MCG tablet Take 100 mcg by mouth daily before breakfast.     pyridOXINE (VITAMIN B-6) 100 MG tablet Take 100 mg by mouth daily.     zinc gluconate 50 MG tablet Take 50 mg by mouth daily.      Drug Regimen Review Drug regimen was reviewed and remains appropriate with no significant issues identified  Home: Home Living Family/patient expects to be discharged to:: Private residence Living Arrangements: Spouse/significant other Available Help at Discharge: Family, Available 24 hours/day Type of Home: House Home Access: Level entry Home Layout: One level Bathroom Shower/Tub: Gaffer, Chiropodist: Handicapped height Bathroom Accessibility: Yes Home Equipment: Crutches, Hand held shower head Additional Comments: no pets  Lives With: Spouse   Functional History: Prior Function Level of Independence: Independent Comments: retired, enjoys gardening and reading, prescription glassess at  baseline  Functional Status:  Mobility: Bed Mobility Overal bed mobility: Modified Independent Bed Mobility: Supine to Sit Rolling: Max assist, +2 for physical assistance Supine to sit: Supervision, HOB elevated General bed mobility comments: Patient was able to get herself seated at edge of bed using bed rails with supervision. increased time and effort to perform. Transfers Overall transfer level: Needs assistance Equipment used: Ambulation equipment used Transfer via Lift Equipment: Stedy Transfers: Sit to/from Stand, Risk manager Sit to Stand: Mod assist, +2 physical assistance, From elevated surface, Min assist Stand pivot transfers: Mod assist General transfer comment: Patient initially required mod assist for sit to stand and cues for "nose over toes", upon subsequent trials she required min assist. Performed STS x 4.  Improved midline standing. Ambulation/Gait General Gait Details: marching in place in stedy x 2 sets of about 15 prior to fatiguing. Patient should be able to try Hemiwalker next session    ADL: ADL Overall ADL's : Needs assistance/impaired Eating/Feeding: NPO Grooming: Oral care, Moderate assistance,  Standing Grooming Details (indicate cue type and reason): needs (A) to apply paste and completes task with decreased time than needed. pt does completed sequence correctly Lower Body Bathing: Maximal assistance General ADL Comments: pt with purewick in place due to help with incontinence. pt output of > 300 CC  Cognition: Cognition Overall Cognitive Status: Impaired/Different from baseline Arousal/Alertness: Awake/alert Orientation Level: Oriented to person, Oriented to place Attention: Sustained Sustained Attention: Appears intact (suspect deficits with alterating and divided attention) Memory:  (will assess further) Awareness: Impaired Awareness Impairment: Emergent impairment, Anticipatory impairment Problem Solving: Appears intact (for  basic) Safety/Judgment: Other (comment) (prognosis good) Cognition Arousal/Alertness: Awake/alert Behavior During Therapy: WFL for tasks assessed/performed Overall Cognitive Status: Impaired/Different from baseline Area of Impairment: Problem solving Following Commands: Follows multi-step commands with increased time, Follows one step commands consistently Safety/Judgement: Decreased awareness of deficits (unaware of R sided impaired sensation) Awareness: Emergent Problem Solving: Difficulty sequencing, Requires verbal cues, Requires tactile cues General Comments: pt able to follow commands consistently, aware of deficits including speech recommendations, incresaed time to process  Physical Exam: Blood pressure 135/83, pulse 79, temperature 99.1 F (37.3 C), temperature source Oral, resp. rate 17, height '5\' 7"'$  (1.702 m), weight 132.5 kg, SpO2 97 %. Physical Exam Vitals and nursing note reviewed. Exam conducted with a chaperone present.  Constitutional:      Comments: Pt sitting up in bedside chair eating D2 thin diet with 2 close friends at bedside; appropriate, NAD; appears younger than stated age- eating with L hand, in spite of R handedness.   HENT:     Head: Normocephalic and atraumatic.     Comments: R facial droop- missing at least 1 tooth Tongue midline Sensation very decreased on R side of mouth/face- says been chewing on inside mouth.     Right Ear: External ear normal.     Left Ear: External ear normal.     Nose: Nose normal. No congestion.     Mouth/Throat:     Mouth: Mucous membranes are dry.     Pharynx: Oropharynx is clear. No oropharyngeal exudate.     Comments: Chew marks inside R side of mouth Eyes:     General:        Right eye: No discharge.        Left eye: No discharge.     Conjunctiva/sclera: Conjunctivae normal.  Cardiovascular:     Rate and Rhythm: Normal rate. Rhythm irregular.     Heart sounds: Normal heart sounds. No murmur heard.   No gallop.   Pulmonary:     Comments: CTA B/L- no W/R/R- good air movement   Abdominal:     Comments: Soft, NT, ND, (+)BS -hypoactive  Genitourinary:    Comments: Purewick in place- light amber urine in container- passing urine while there Musculoskeletal:     Cervical back: Normal range of motion and neck supple.     Comments: RUE- biceps 4-/5, triceps 3+/5, WE/grip 2/5 and FA 1/5 LUE- 5/5 in same muscles tested RLE- HF 3+/5, KE/KF/DF and PF 4+/5 LLE- 5/5 in same muscles   Skin:    Comments: R inner forearm- wound- not sure what- slightly erythematous- skin appears torn in 1 place and a little swollen- pt said better R hand IV dorsum- looks good  Neurological:     Comments: Patient is alert.  Makes eye contact with examiner.  Expressive aphasia however answer simple questions with significant paraphasic errors.  Follows simple commands.  She does have some difficulty with spontaneous  speech.  Significant/severe aphasia, but still speaking some- R inattention- moderate to mod-severe- also has significant decreased sensation to light touch in RUE/RLE- per pt, is "numb".    Psychiatric:     Comments: Calm; no anxiety- interactive    Results for orders placed or performed during the hospital encounter of 12/02/20 (from the past 48 hour(s))  Glucose, capillary     Status: Abnormal   Collection Time: 12/07/20  7:39 AM  Result Value Ref Range   Glucose-Capillary 110 (H) 70 - 99 mg/dL    Comment: Glucose reference range applies only to samples taken after fasting for at least 8 hours.  Glucose, capillary     Status: Abnormal   Collection Time: 12/07/20 12:02 PM  Result Value Ref Range   Glucose-Capillary 132 (H) 70 - 99 mg/dL    Comment: Glucose reference range applies only to samples taken after fasting for at least 8 hours.  Glucose, capillary     Status: Abnormal   Collection Time: 12/07/20  3:42 PM  Result Value Ref Range   Glucose-Capillary 104 (H) 70 - 99 mg/dL    Comment: Glucose  reference range applies only to samples taken after fasting for at least 8 hours.  Glucose, capillary     Status: Abnormal   Collection Time: 12/07/20  8:10 PM  Result Value Ref Range   Glucose-Capillary 168 (H) 70 - 99 mg/dL    Comment: Glucose reference range applies only to samples taken after fasting for at least 8 hours.   Comment 1 Notify RN    Comment 2 Document in Chart   Glucose, capillary     Status: Abnormal   Collection Time: 12/07/20 11:13 PM  Result Value Ref Range   Glucose-Capillary 109 (H) 70 - 99 mg/dL    Comment: Glucose reference range applies only to samples taken after fasting for at least 8 hours.   Comment 1 Notify RN    Comment 2 Document in Chart   Basic metabolic panel     Status: Abnormal   Collection Time: 12/08/20  3:11 AM  Result Value Ref Range   Sodium 139 135 - 145 mmol/L   Potassium 4.3 3.5 - 5.1 mmol/L   Chloride 103 98 - 111 mmol/L   CO2 28 22 - 32 mmol/L   Glucose, Bld 109 (H) 70 - 99 mg/dL    Comment: Glucose reference range applies only to samples taken after fasting for at least 8 hours.   BUN 35 (H) 8 - 23 mg/dL   Creatinine, Ser 1.06 (H) 0.44 - 1.00 mg/dL   Calcium 9.5 8.9 - 10.3 mg/dL   GFR, Estimated 56 (L) >60 mL/min    Comment: (NOTE) Calculated using the CKD-EPI Creatinine Equation (2021)    Anion gap 8 5 - 15    Comment: Performed at [facility] [address] [place], [place] [address]  CBC     Status: Abnormal   Collection Time: 12/08/20  3:11 AM  Result Value Ref Range   WBC 8.4 4.0 - 10.5 K/uL   RBC 3.71 (L) 3.87 - 5.11 MIL/uL   Hemoglobin 10.8 (L) 12.0 - 15.0 g/dL   HCT 34.6 (L) 36.0 - 46.0 %   MCV 93.3 80.0 - 100.0 fL   MCH 29.1 26.0 - 34.0 pg   MCHC 31.2 30.0 - 36.0 g/dL   RDW 13.2 11.5 - 15.5 %   Platelets 294 150 - 400 K/uL   nRBC 0.0 0.0 - 0.2 %  Comment: Performed at [facility], [place] [address], [place], [place] [address]  Glucose, capillary     Status: Abnormal   Collection Time: 12/08/20   3:47 AM  Result Value Ref Range   Glucose-Capillary 104 (H) 70 - 99 mg/dL    Comment: Glucose reference range applies only to samples taken after fasting for at least 8 hours.   Comment 1 Notify RN    Comment 2 Document in Chart   Glucose, capillary     Status: Abnormal   Collection Time: 12/08/20  8:40 AM  Result Value Ref Range   Glucose-Capillary 145 (H) 70 - 99 mg/dL    Comment: Glucose reference range applies only to samples taken after fasting for at least 8 hours.  Glucose, capillary     Status: None   Collection Time: 12/08/20 11:55 AM  Result Value Ref Range   Glucose-Capillary 98 70 - 99 mg/dL    Comment: Glucose reference range applies only to samples taken after fasting for at least 8 hours.  Glucose, capillary     Status: None   Collection Time: 12/08/20  4:13 PM  Result Value Ref Range   Glucose-Capillary 97 70 - 99 mg/dL    Comment: Glucose reference range applies only to samples taken after fasting for at least 8 hours.  Glucose, capillary     Status: Abnormal   Collection Time: 12/08/20  8:26 PM  Result Value Ref Range   Glucose-Capillary 149 (H) 70 - 99 mg/dL    Comment: Glucose reference range applies only to samples taken after fasting for at least 8 hours.  Glucose, capillary     Status: Abnormal   Collection Time: 12/09/20 12:04 AM  Result Value Ref Range   Glucose-Capillary 115 (H) 70 - 99 mg/dL    Comment: Glucose reference range applies only to samples taken after fasting for at least 8 hours.  Basic metabolic panel     Status: Abnormal   Collection Time: 12/09/20  3:27 AM  Result Value Ref Range   Sodium 137 135 - 145 mmol/L   Potassium 4.8 3.5 - 5.1 mmol/L   Chloride 99 98 - 111 mmol/L   CO2 31 22 - 32 mmol/L   Glucose, Bld 118 (H) 70 - 99 mg/dL    Comment: Glucose reference range applies only to samples taken after fasting for at least 8 hours.   BUN 32 (H) 8 - 23 mg/dL   Creatinine, Ser 1.32 (H) 0.44 - 1.00 mg/dL   Calcium 9.8 8.9 - 10.3 mg/dL    GFR, Estimated 43 (L) >60 mL/min    Comment: (NOTE) Calculated using the CKD-EPI Creatinine Equation (2021)    Anion gap 7 5 - 15    Comment: Performed at [facility] [address] [place], [place] [address]  CBC     Status: Abnormal   Collection Time: 12/09/20  3:27 AM  Result Value Ref Range   WBC 8.0 4.0 - 10.5 K/uL   RBC 3.92 3.87 - 5.11 MIL/uL   Hemoglobin 11.3 (L) 12.0 - 15.0 g/dL   HCT 36.6 36.0 - 46.0 %   MCV 93.4 80.0 - 100.0 fL   MCH 28.8 26.0 - 34.0 pg   MCHC 30.9 30.0 - 36.0 g/dL   RDW 13.2 11.5 - 15.5 %   Platelets 327 150 - 400 K/uL   nRBC 0.0 0.0 - 0.2 %    Comment: Performed at [facility], [place] [address],  [place], [place] [address]  Glucose, capillary     Status: Abnormal   Collection Time: 12/09/20  4:12 AM  Result Value Ref Range   Glucose-Capillary 155 (H) 70 - 99 mg/dL    Comment: Glucose reference range applies only to samples taken after fasting for at least 8 hours.   VAS [place] UPPER EXTREMITY VENOUS DUPLEX  Result Date: 12/08/2020 UPPER VENOUS STUDY  Patient Name:  CHANCE MATHIEU  Date of Exam:   12/08/2020 Medical Rec #: [id number]         Accession #:    [id number] Date of Birth: 01-12-1951         Patient Gender: F Patient Age:   70Y Exam Location:  [facility] Procedure:      VAS [place] UPPER EXTREMITY VENOUS DUPLEX Referring Phys: [id number] Chrystie Nose PAYNE --------------------------------------------------------------------------------  Indications: "phlebitis" per MD order Limitations: Body habitus, poor ultrasound/tissue interface and immobility. Comparison Study: No previous exams Performing Technologist: Jody Hill RVT, RDMS  Examination Guidelines: A complete evaluation includes B-mode imaging, spectral Doppler, color Doppler, and power Doppler as needed of all accessible portions of each vessel. Bilateral testing is considered an integral part of a complete examination. Limited examinations for reoccurring indications may be performed as  noted.  Right Findings: +----------+------------+---------+-----------+----------+---------------------+ RIGHT     CompressiblePhasicitySpontaneousProperties       Summary        +----------+------------+---------+-----------+----------+---------------------+ IJV           Full       Yes       Yes                                    +----------+------------+---------+-----------+----------+---------------------+ Subclavian    Full       Yes       Yes                                    +----------+------------+---------+-----------+----------+---------------------+ Axillary      Full       Yes       Yes                                    +----------+------------+---------+-----------+----------+---------------------+ Brachial                 Yes       Yes                Compression image                                                       lost, patent by color                                                          and doppler      +----------+------------+---------+-----------+----------+---------------------+ Radial        Full                                                        +----------+------------+---------+-----------+----------+---------------------+  Ulnar         Full                                                        +----------+------------+---------+-----------+----------+---------------------+ Cephalic      Full                                                        +----------+------------+---------+-----------+----------+---------------------+ Basilic       Full       Yes       Yes                                    +----------+------------+---------+-----------+----------+---------------------+  Left Findings: +----------+------------+---------+-----------+----------+---------------+ LEFT      CompressiblePhasicitySpontaneousProperties    Summary      +----------+------------+---------+-----------+----------+---------------+ IJV           Full       Yes       Yes                              +----------+------------+---------+-----------+----------+---------------+ Subclavian    Full       Yes       Yes                              +----------+------------+---------+-----------+----------+---------------+ Axillary      Full       Yes       Yes                              +----------+------------+---------+-----------+----------+---------------+ Brachial      Full       Yes       Yes                              +----------+------------+---------+-----------+----------+---------------+ Radial        Full                                                  +----------+------------+---------+-----------+----------+---------------+ Ulnar         Full                                                  +----------+------------+---------+-----------+----------+---------------+ Cephalic      None       No        No               Forearm - Acute +----------+------------+---------+-----------+----------+---------------+ Basilic       Full       Yes       Yes                              +----------+------------+---------+-----------+----------+---------------+  Summary:  Right: No evidence of deep vein thrombosis in the upper extremity. No evidence of superficial vein thrombosis in the upper extremity.  Left: No evidence of deep vein thrombosis in the upper extremity. Findings consistent with acute superficial vein thrombosis involving the left cephalic vein at the level of the forearm.  *See table(s) above for measurements and observations.  Diagnosing physician: Jamelle Haring Electronically signed by Jamelle Haring on 12/08/2020 at 3:56:38 PM.    Final        Medical Problem List and Plan: 1.  Right side weakness and aphasia secondary to left MCA infarct due to left MCA occlusion status post thrombectomy with TIC12c  etiology likely due to newly diagnosed atrial fibrillation  -patient may  shower  -ELOS/Goals: 14-16 days- supervision 2.  Antithrombotics: -DVT/anticoagulation: Eliquis  -antiplatelet therapy: N/A 3. Pain Management: Tylenol as needed 4. Mood: Provide emotional support  -antipsychotic agents: N/A 5. Neuropsych: This patient is not ? capable of making decisions on her own behalf. 6. Skin/Wound Care: Routine skin checks 7. Fluids/Electrolytes/Nutrition: Routine in and outs with follow-up chemistries 8.  New onset atrial fibrillation.  Amiodarone 200 mg daily, Lanoxin 0.125 mg daily, Cardizem 240 mg daily, Lopressor 25 mg twice daily.  Follow-up cardiology services 9.  Hypothyroidism.  Synthroid 10.  Dysphagia.  Dysphagia #2 thin liquids.  Follow-up speech therapy- chewing inside mouth- monitor closely for pocketing? 11.  Hyperlipidemia.  Crestor 12.  Morbid obesity.  BMI 45.75.  Dietary follow-up 13.  AKI.  Follow-up chemistries 14. R inattention- make sure arm doesn't get stuck underneath her- occurred x1 in exam.    Cathlyn Parsons, PA-C 12/09/2020   I have personally performed a face to face diagnostic evaluation of this patient and formulated the key components of the plan.  Additionally, I have personally reviewed laboratory data, imaging studies, as well as relevant notes and concur with the physician assistant's documentation above.   The patient's status has not changed from the original H&P.  Any changes in documentation from the acute care chart have been noted above.

## 2020-12-09 NOTE — H&P (Signed)
Physical Medicine and Rehabilitation Admission H&P       HPI: Candice Perez is a 70 year old right-handed female with history of hypertension, hyperlipidemia, prior TIA maintained on Plavix 05/2017, history of DVT, quit smoking approximately 10 years ago.  Per chart review patient lives with spouse.  1 level home with level entry.  Independent prior to admission and active.  Presented to [facility] 12/02/2020 with new onset atrial fibrillation with RVR and placed on Eliquis.  Patient with acute onset right side weakness and aphasia in the ED code stroke was activated and CTA revealed LVO with occlusion of the superior division of the left middle cerebral artery.  She was transferred to [facility] for emergent mechanical thrombectomy per interventional radiology.  Follow-up MRI of the brain showed small to moderate acute cortical infarct in the left frontal parietal cortex primarily in the postcentral gyrus.  No associated hemorrhage.  Cardiology service follow-up for atrial fibrillation RVR with latest echocardiogram ejection fraction 40 to [id number] grade 1 diastolic dysfunction.  The left ventricle demonstrating global hypokinesis.  Patient currently remains on Eliquis for CVA prophylaxis as well as atrial fibrillation.  Amiodarone ongoing for atrial fibrillation as well as Lanoxin with Cardizem.  Upper extremity Dopplers negative for DVT.  Hospital course mild AKI 1.32 and monitored.  Initially with nasogastric tube feeds for nutritional support her diet has been advanced to a dysphagia #2 thin liquid diet.  Therapy evaluations completed due to patient's right side weakness and aphasia was admitted for a comprehensive rehab program.     Pt reports with multiples cues that having pain in R hand greater than L hand- due to arthritis- is chronic- pain meds helping- initially too sedating, but  new dosing is better.   Said the pain is "bad". Doesn't remember when had LBM, but denies  constipation. Thinks it's been since admitted. Has control of urine per pt, but has been using Purewick. Tolerating D2 thin diet. Michela Pitcher has difficulty coming up with words (with cues), but understands everything said.      Review of Systems Constitutional:  Negative for chills and fever. HENT:  Negative for hearing loss.   Eyes:  Negative for blurred vision, double vision and photophobia. Respiratory:  Negative for cough and shortness of breath.   Cardiovascular:  Positive for palpitations. Negative for chest pain and leg swelling. Gastrointestinal:  Positive for constipation. Negative for heartburn, nausea and vomiting.       GERD  Genitourinary:  Negative for dysuria, flank pain and hematuria. Musculoskeletal:  Positive for myalgias. Skin:  Negative for rash. Neurological:  Positive for speech change and weakness.  All other systems reviewed and are negative.     Past Medical History:  Diagnosis Date   Arthritis     DVT (deep venous thrombosis) (HCC)     Edema     GERD (gastroesophageal reflux disease)      OCCASIONAL - PRILOSEC IF NEEDED   Hyperlipidemia     Hypertension      no meds now   Hypothyroidism     Varicose veins           Past Surgical History:  Procedure Laterality Date   CHOLECYSTECTOMY        Laparoscopic   CYSTOCELE REPAIR       HERNIA REPAIR       IR CT HEAD LTD   12/02/2020   IR PERCUTANEOUS ART THROMBECTOMY/INFUSION INTRACRANIAL INC DIAG ANGIO   12/02/2020  RADIOLOGY WITH ANESTHESIA N/A 12/02/2020    Procedure: IR WITH ANESTHESIA;  Surgeon: Luanne Bras, MD;  Location: [facility];  Service: Radiology;  Laterality: N/A;   TONSILLECTOMY       TOTAL KNEE ARTHROPLASTY Left 08/26/2013    Procedure: LEFT TOTAL KNEE REVISION;  Surgeon: Mauri Pole, MD;  Location: [facility];  Service: Orthopedics;  Laterality: Left;   VARICOSE VEIN SURGERY             Family History  Problem Relation Age of Onset   Congestive Heart Failure Mother     Cancer Mother           tongue/sinus   Cancer Father          "in his back"   Other Father          Varicose veins, Arteriosclerosis    Social History:  reports that she quit smoking about 10 years ago. Her smoking use included cigarettes. She has never used smokeless tobacco. She reports that she does not drink alcohol and does not use drugs. Allergies: No Known Allergies       Medications Prior to Admission  Medication Sig Dispense Refill   amLODipine-valsartan (EXFORGE) 5-320 MG tablet Take 1 tablet by mouth daily.       ascorbic acid (VITAMIN C) 250 MG CHEW Chew 500 mg by mouth daily.       Biotin w/ Vitamins C & E (HAIR/SKIN/NAILS PO) Take 1 tablet by mouth daily.       buPROPion (WELLBUTRIN XL) 150 MG 24 hr tablet Take 150 mg by mouth daily.       Cholecalciferol (VITAMIN D3) 125 MCG (5000 UT) CAPS Take 1 capsule by mouth daily.       clopidogrel (PLAVIX) 75 MG tablet Take 1 tablet (75 mg total) by mouth daily. 90 tablet 3   Coenzyme Q10 (CO Q-10) 100 MG CAPS Take 1 tablet by mouth daily.       levothyroxine (SYNTHROID, LEVOTHROID) 100 MCG tablet Take 100 mcg by mouth daily before breakfast.       pyridOXINE (VITAMIN B-6) 100 MG tablet Take 100 mg by mouth daily.       zinc gluconate 50 MG tablet Take 50 mg by mouth daily.          Drug Regimen Review Drug regimen was reviewed and remains appropriate with no significant issues identified   Home: Home Living Family/patient expects to be discharged to:: Private residence Living Arrangements: Spouse/significant other Available Help at Discharge: Family, Available 24 hours/day Type of Home: House Home Access: Level entry Home Layout: One level Bathroom Shower/Tub: Gaffer, Chiropodist: Handicapped height Bathroom Accessibility: Yes Home Equipment: Crutches, Hand held shower head Additional Comments: no pets  Lives With: Spouse   Functional History: Prior Function Level of Independence: Independent Comments:  retired, enjoys gardening and reading, prescription glassess at baseline   Functional Status:  Mobility: Bed Mobility Overal bed mobility: Modified Independent Bed Mobility: Supine to Sit Rolling: Max assist, +2 for physical assistance Supine to sit: Supervision, HOB elevated General bed mobility comments: Patient was able to get herself seated at edge of bed using bed rails with supervision. increased time and effort to perform. Transfers Overall transfer level: Needs assistance Equipment used: Ambulation equipment used Transfer via Lift Equipment: Stedy Transfers: Sit to/from Stand, Risk manager Sit to Stand: Mod assist, +2 physical assistance, From elevated surface, Min assist Stand pivot transfers: Mod assist General transfer comment: Patient  initially required mod assist for sit to stand and cues for "nose over toes", upon subsequent trials she required min assist. Performed STS x 4.  Improved midline standing. Ambulation/Gait General Gait Details: marching in place in stedy x 2 sets of about 15 prior to fatiguing. Patient should be able to try Hemiwalker next session   ADL: ADL Overall ADL's : Needs assistance/impaired Eating/Feeding: NPO Grooming: Oral care, Moderate assistance, Standing Grooming Details (indicate cue type and reason): needs (A) to apply paste and completes task with decreased time than needed. pt does completed sequence correctly Lower Body Bathing: Maximal assistance General ADL Comments: pt with purewick in place due to help with incontinence. pt output of > 300 CC   Cognition: Cognition Overall Cognitive Status: Impaired/Different from baseline Arousal/Alertness: Awake/alert Orientation Level: Oriented to person, Oriented to place Attention: Sustained Sustained Attention: Appears intact (suspect deficits with alterating and divided attention) Memory:  (will assess further) Awareness: Impaired Awareness Impairment: Emergent impairment,  Anticipatory impairment Problem Solving: Appears intact (for basic) Safety/Judgment: Other (comment) (prognosis good) Cognition Arousal/Alertness: Awake/alert Behavior During Therapy: WFL for tasks assessed/performed Overall Cognitive Status: Impaired/Different from baseline Area of Impairment: Problem solving Following Commands: Follows multi-step commands with increased time, Follows one step commands consistently Safety/Judgement: Decreased awareness of deficits (unaware of R sided impaired sensation) Awareness: Emergent Problem Solving: Difficulty sequencing, Requires verbal cues, Requires tactile cues General Comments: pt able to follow commands consistently, aware of deficits including speech recommendations, incresaed time to process   Physical Exam: Blood pressure 135/83, pulse 79, temperature 99.1 F (37.3 C), temperature source Oral, resp. rate 17, height '5\' 7"'$  (1.702 m), weight 132.5 kg, SpO2 97 %. Physical Exam Vitals and nursing note reviewed. Exam conducted with a chaperone present. Constitutional:      Comments: Pt sitting up in bedside chair eating D2 thin diet with 2 close friends at bedside; appropriate, NAD; appears younger than stated age- eating with L hand, in spite of R handedness.   HENT:    Head: Normocephalic and atraumatic.    Comments: R facial droop- missing at least 1 tooth Tongue midline Sensation very decreased on R side of mouth/face- says been chewing on inside mouth.     Right Ear: External ear normal.    Left Ear: External ear normal.    Nose: Nose normal. No congestion.    Mouth/Throat:    Mouth: Mucous membranes are dry.    Pharynx: Oropharynx is clear. No oropharyngeal exudate.    Comments: Chew marks inside R side of mouth Eyes:    General:        Right eye: No discharge.        Left eye: No discharge.    Conjunctiva/sclera: Conjunctivae normal. Cardiovascular:    Rate and Rhythm: Normal rate. Rhythm irregular.    Heart sounds: Normal  heart sounds. No murmur heard.   No gallop. Pulmonary:    Comments: CTA B/L- no W/R/R- good air movement     Abdominal:    Comments: Soft, NT, ND, (+)BS -hypoactive  Genitourinary:    Comments: Purewick in place- light amber urine in container- passing urine while there Musculoskeletal:    Cervical back: Normal range of motion and neck supple.    Comments: RUE- biceps 4-/5, triceps 3+/5, WE/grip 2/5 and FA 1/5 LUE- 5/5 in same muscles tested RLE- HF 3+/5, KE/KF/DF and PF 4+/5 LLE- 5/5 in same muscles   Skin:    Comments: R inner forearm- wound- not sure what- slightly erythematous- skin  appears torn in 1 place and a little swollen- pt said better R hand IV dorsum- looks good  Neurological:    Comments: Patient is alert.  Makes eye contact with examiner.  Expressive aphasia however answer simple questions with significant paraphasic errors.  Follows simple commands.  She does have some difficulty with spontaneous speech.   Significant/severe aphasia, but still speaking some- R inattention- moderate to mod-severe- also has significant decreased sensation to light touch in RUE/RLE- per pt, is "numb".    Psychiatric:    Comments: Calm; no anxiety- interactive      Lab Results Last 48 Hours        Results for orders placed or performed during the hospital encounter of 12/02/20 (from the past 48 hour(s))  Glucose, capillary     Status: Abnormal    Collection Time: 12/07/20  7:39 AM  Result Value Ref Range    Glucose-Capillary 110 (H) 70 - 99 mg/dL      Comment: Glucose reference range applies only to samples taken after fasting for at least 8 hours.  Glucose, capillary     Status: Abnormal    Collection Time: 12/07/20 12:02 PM  Result Value Ref Range    Glucose-Capillary 132 (H) 70 - 99 mg/dL      Comment: Glucose reference range applies only to samples taken after fasting for at least 8 hours.  Glucose, capillary     Status: Abnormal    Collection Time: 12/07/20  3:42 PM   Result Value Ref Range    Glucose-Capillary 104 (H) 70 - 99 mg/dL      Comment: Glucose reference range applies only to samples taken after fasting for at least 8 hours.  Glucose, capillary     Status: Abnormal    Collection Time: 12/07/20  8:10 PM  Result Value Ref Range    Glucose-Capillary 168 (H) 70 - 99 mg/dL      Comment: Glucose reference range applies only to samples taken after fasting for at least 8 hours.    Comment 1 Notify RN      Comment 2 Document in Chart    Glucose, capillary     Status: Abnormal    Collection Time: 12/07/20 11:13 PM  Result Value Ref Range    Glucose-Capillary 109 (H) 70 - 99 mg/dL      Comment: Glucose reference range applies only to samples taken after fasting for at least 8 hours.    Comment 1 Notify RN      Comment 2 Document in Chart    Basic metabolic panel     Status: Abnormal    Collection Time: 12/08/20  3:11 AM  Result Value Ref Range    Sodium 139 135 - 145 mmol/L    Potassium 4.3 3.5 - 5.1 mmol/L    Chloride 103 98 - 111 mmol/L    CO2 28 22 - 32 mmol/L    Glucose, Bld 109 (H) 70 - 99 mg/dL      Comment: Glucose reference range applies only to samples taken after fasting for at least 8 hours.    BUN 35 (H) 8 - 23 mg/dL    Creatinine, Ser 1.06 (H) 0.44 - 1.00 mg/dL    Calcium 9.5 8.9 - 10.3 mg/dL    GFR, Estimated 56 (L) >60 mL/min      Comment: (NOTE) Calculated using the CKD-EPI Creatinine Equation (2021)      Anion gap 8 5 - 15      Comment: Performed  at [facility], [place] [address], [place], [place] [address]  CBC     Status: Abnormal    Collection Time: 12/08/20  3:11 AM  Result Value Ref Range    WBC 8.4 4.0 - 10.5 K/uL    RBC 3.71 (L) 3.87 - 5.11 MIL/uL    Hemoglobin 10.8 (L) 12.0 - 15.0 g/dL    HCT 34.6 (L) 36.0 - 46.0 %    MCV 93.3 80.0 - 100.0 fL    MCH 29.1 26.0 - 34.0 pg    MCHC 31.2 30.0 - 36.0 g/dL    RDW 13.2 11.5 - 15.5 %    Platelets 294 150 - 400 K/uL    nRBC 0.0 0.0 - 0.2 %      Comment: Performed  at [facility], [place] [address] [place], [place] [address]  Glucose, capillary     Status: Abnormal    Collection Time: 12/08/20  3:47 AM  Result Value Ref Range    Glucose-Capillary 104 (H) 70 - 99 mg/dL      Comment: Glucose reference range applies only to samples taken after fasting for at least 8 hours.    Comment 1 Notify RN      Comment 2 Document in Chart    Glucose, capillary     Status: Abnormal    Collection Time: 12/08/20  8:40 AM  Result Value Ref Range    Glucose-Capillary 145 (H) 70 - 99 mg/dL      Comment: Glucose reference range applies only to samples taken after fasting for at least 8 hours.  Glucose, capillary     Status: None    Collection Time: 12/08/20 11:55 AM  Result Value Ref Range    Glucose-Capillary 98 70 - 99 mg/dL      Comment: Glucose reference range applies only to samples taken after fasting for at least 8 hours.  Glucose, capillary     Status: None    Collection Time: 12/08/20  4:13 PM  Result Value Ref Range    Glucose-Capillary 97 70 - 99 mg/dL      Comment: Glucose reference range applies only to samples taken after fasting for at least 8 hours.  Glucose, capillary     Status: Abnormal    Collection Time: 12/08/20  8:26 PM  Result Value Ref Range    Glucose-Capillary 149 (H) 70 - 99 mg/dL      Comment: Glucose reference range applies only to samples taken after fasting for at least 8 hours.  Glucose, capillary     Status: Abnormal    Collection Time: 12/09/20 12:04 AM  Result Value Ref Range    Glucose-Capillary 115 (H) 70 - 99 mg/dL      Comment: Glucose reference range applies only to samples taken after fasting for at least 8 hours.  Basic metabolic panel     Status: Abnormal    Collection Time: 12/09/20  3:27 AM  Result Value Ref Range    Sodium 137 135 - 145 mmol/L    Potassium 4.8 3.5 - 5.1 mmol/L    Chloride 99 98 - 111 mmol/L    CO2 31 22 - 32 mmol/L    Glucose, Bld 118 (H) 70 - 99 mg/dL      Comment: Glucose reference range  applies only to samples taken after fasting for at least 8 hours.    BUN 32 (H) 8 - 23 mg/dL    Creatinine, Ser 1.32 (H) 0.44 - 1.00 mg/dL  Calcium 9.8 8.9 - 10.3 mg/dL    GFR, Estimated 43 (L) >60 mL/min      Comment: (NOTE) Calculated using the CKD-EPI Creatinine Equation (2021)      Anion gap 7 5 - 15      Comment: Performed at [facility], [place] [address] [place], [place] [address]  CBC     Status: Abnormal    Collection Time: 12/09/20  3:27 AM  Result Value Ref Range    WBC 8.0 4.0 - 10.5 K/uL    RBC 3.92 3.87 - 5.11 MIL/uL    Hemoglobin 11.3 (L) 12.0 - 15.0 g/dL    HCT 36.6 36.0 - 46.0 %    MCV 93.4 80.0 - 100.0 fL    MCH 28.8 26.0 - 34.0 pg    MCHC 30.9 30.0 - 36.0 g/dL    RDW 13.2 11.5 - 15.5 %    Platelets 327 150 - 400 K/uL    nRBC 0.0 0.0 - 0.2 %      Comment: Performed at [facility], [place] [address], [place], [place] [address]  Glucose, capillary     Status: Abnormal    Collection Time: 12/09/20  4:12 AM  Result Value Ref Range    Glucose-Capillary 155 (H) 70 - 99 mg/dL      Comment: Glucose reference range applies only to samples taken after fasting for at least 8 hours.       Imaging Results (Last 48 hours)  VAS [place] UPPER EXTREMITY VENOUS DUPLEX   Result Date: 12/08/2020 UPPER VENOUS STUDY  Patient Name:  MELEK KLEMPNER  Date of Exam:   12/08/2020 Medical Rec #: [id number]         Accession #:    [id number] Date of Birth: 09-04-1950         Patient Gender: F Patient Age:   70Y Exam [facility]  [facility] Procedure:      VAS [place] UPPER EXTREMITY VENOUS DUPLEX Referring Phys: [id number] Chrystie Nose PAYNE --------------------------------------------------------------------------------  Indications: "phlebitis" per MD order Limitations: Body habitus, poor ultrasound/tissue interface and immobility. Comparison Study: No previous exams Performing Technologist: Jody Hill RVT, RDMS  Examination Guidelines: A complete evaluation includes B-mode imaging, spectral  Doppler, color Doppler, and power Doppler as needed of all accessible portions of each vessel. Bilateral testing is considered an integral part of a complete examination. Limited examinations for reoccurring indications may be performed as noted.  Right Findings: +----------+------------+---------+-----------+----------+---------------------+ RIGHT     CompressiblePhasicitySpontaneousProperties       Summary        +----------+------------+---------+-----------+----------+---------------------+ IJV           Full       Yes       Yes                                    +----------+------------+---------+-----------+----------+---------------------+ Subclavian    Full       Yes       Yes                                    +----------+------------+---------+-----------+----------+---------------------+ Axillary      Full       Yes       Yes                                    +----------+------------+---------+-----------+----------+---------------------+  Brachial                 Yes       Yes                Compression image                                                       lost, patent by color                                                          and doppler      +----------+------------+---------+-----------+----------+---------------------+ Radial        Full                                                        +----------+------------+---------+-----------+----------+---------------------+ Ulnar         Full                                                        +----------+------------+---------+-----------+----------+---------------------+ Cephalic      Full                                                        +----------+------------+---------+-----------+----------+---------------------+ Basilic       Full       Yes       Yes                                     +----------+------------+---------+-----------+----------+---------------------+  Left Findings: +----------+------------+---------+-----------+----------+---------------+ LEFT      CompressiblePhasicitySpontaneousProperties    Summary     +----------+------------+---------+-----------+----------+---------------+ IJV           Full       Yes       Yes                              +----------+------------+---------+-----------+----------+---------------+ Subclavian    Full       Yes       Yes                              +----------+------------+---------+-----------+----------+---------------+ Axillary      Full       Yes       Yes                              +----------+------------+---------+-----------+----------+---------------+ Brachial  Full       Yes       Yes                              +----------+------------+---------+-----------+----------+---------------+ Radial        Full                                                  +----------+------------+---------+-----------+----------+---------------+ Ulnar         Full                                                  +----------+------------+---------+-----------+----------+---------------+ Cephalic      None       No        No               Forearm - Acute +----------+------------+---------+-----------+----------+---------------+ Basilic       Full       Yes       Yes                              +----------+------------+---------+-----------+----------+---------------+  Summary:  Right: No evidence of deep vein thrombosis in the upper extremity. No evidence of superficial vein thrombosis in the upper extremity.  Left: No evidence of deep vein thrombosis in the upper extremity. Findings consistent with acute superficial vein thrombosis involving the left cephalic vein at the level of the forearm.  *See table(s) above for measurements and observations.  Diagnosing physician: Jamelle Haring  Electronically signed by Jamelle Haring on 12/08/2020 at 3:56:38 PM.    Final              Medical Problem List and Plan: 1.  Right side weakness and aphasia secondary to left MCA infarct due to left MCA occlusion status post thrombectomy with TIC12c etiology likely due to newly diagnosed atrial fibrillation             -patient may  shower             -ELOS/Goals: 14-16 days- supervision 2.  Antithrombotics: -DVT/anticoagulation: Eliquis             -antiplatelet therapy: N/A 3. Pain Management: Tylenol as needed 4. Mood: Provide emotional support             -antipsychotic agents: N/A 5. Neuropsych: This patient is not ? capable of making decisions on her own behalf. 6. Skin/Wound Care: Routine skin checks 7. Fluids/Electrolytes/Nutrition: Routine in and outs with follow-up chemistries 8.  New onset atrial fibrillation.  Amiodarone 200 mg daily, Lanoxin 0.125 mg daily, Cardizem 240 mg daily, Lopressor 25 mg twice daily.  Follow-up cardiology services 9.  Hypothyroidism.  Synthroid 10.  Dysphagia.  Dysphagia #2 thin liquids.  Follow-up speech therapy- chewing inside mouth- monitor closely for pocketing? 11.  Hyperlipidemia.  Crestor 12.  Morbid obesity.  BMI 45.75.  Dietary follow-up 13.  AKI.  Follow-up chemistries 14. R inattention- make sure arm doesn't get stuck underneath her- occurred x1 in exam.      Cathlyn Parsons, PA-C 12/09/2020    I have personally performed a  face to face diagnostic evaluation of this patient and formulated the key components of the plan.  Additionally, I have personally reviewed laboratory data, imaging studies, as well as relevant notes and concur with the physician assistant's documentation above.   The patient's status has not changed from the original H&P.  Any changes in documentation from the acute care chart have been noted above.

## 2020-12-09 NOTE — Progress Notes (Signed)
Occupational Therapy Treatment Patient Details Name: Candice Perez MRN: [id number] DOB: 06/03/1950 Today's Date: 12/09/2020    History of present illness The pt is a 70 yo female presenting to [facility] on 7/21 due to new onset afib with RVR, but required transfer to [facility] from [place] via EMS on 7/21 after acute onset aphasia and R sided weakness. The pt is now s/p revascularization of L MCA on 7/21. PMH includes: HTN, HLD, TIA, stoke, DVT, depression, and GERD.   OT comments  Patient with great progress demonstrated this date.  Patient is actively moving her R arm much better, and was able to use a hemi walker for SPT back to bed after lunch.  Barriers are listed below.  She is expected to transition to CIR today, but acute OT will continue to follow to maximize her functional status.    Follow Up Recommendations  CIR    Equipment Recommendations  Hospital bed;Wheelchair cushion (measurements OT);Wheelchair (measurements OT)    Recommendations for Other Services      Precautions / Restrictions Precautions Precautions: Fall Restrictions Weight Bearing Restrictions: No       Mobility Bed Mobility   Bed Mobility: Sit to Supine       Sit to supine: Min assist     Patient Response: Cooperative  Transfers Overall transfer level: Needs assistance Equipment used: Hemi-walker Transfers: Sit to/from [facility] Sit to Stand: Min assist;Mod assist Stand pivot transfers: Min assist;Mod assist            Balance Overall balance assessment: Needs assistance Sitting-balance support: Feet supported Sitting balance-Leahy Scale: Good     Standing balance support: Single extremity supported;During functional activity Standing balance-Leahy Scale: Fair                             ADL either performed or assessed with clinical judgement   ADL   Eating/Feeding: Set up;Sitting                                                                                                              Exercises Other Exercises Other Exercises: seated LAQ, marching, AP, hip abd/add x 10-15 reps bilaterally.  STS from recliner with cues and min/mod +2 assist. leaning to right when descending back into recliner                Pertinent Vitals/ Pain       Pain Assessment: No/denies pain Pain Intervention(s): Monitored during session                                                          Frequency  Min 2X/week        Progress Toward Goals  OT Goals(current goals can now be found in the care plan section)  Progress towards OT  goals: Progressing toward goals  Acute Rehab OT Goals Patient Stated Goal: to go to rehab OT Goal Formulation: With patient Time For Goal Achievement: 12/18/20 Potential to Achieve Goals: Good  Plan Discharge plan remains appropriate    Co-evaluation                 AM-PAC OT "6 Clicks" Daily Activity     Outcome Measure   Help from another person eating meals?: A Little Help from another person taking care of personal grooming?: A Lot Help from another person toileting, which includes using toliet, bedpan, or urinal?: A Lot Help from another person bathing (including washing, rinsing, drying)?: A Lot Help from another person to put on and taking off regular upper body clothing?: A Lot Help from another person to put on and taking off regular lower body clothing?: A Lot 6 Click Score: 13    End of Session Equipment Utilized During Treatment: Gait belt  OT Visit Diagnosis: Unsteadiness on feet (R26.81);Muscle weakness (generalized) (M62.81);Hemiplegia and hemiparesis Hemiplegia - Right/Left: Right   Activity Tolerance Patient tolerated treatment well   Patient Left in bed;with call bell/phone within reach;with bed alarm set   Nurse Communication          Time: [id number] OT Time Calculation (min): 14  min  Charges: OT General Charges $OT Visit: 1 Visit OT Treatments $Therapeutic Activity: 8-22 mins  12/09/2020  [place], OTR/L  Acute Rehabilitation Services  Office:  [phone]    [name] 12/09/2020, 3:04 PM

## 2020-12-09 NOTE — TOC Transition Note (Signed)
Transition of Care [facility]) - CM/SW Discharge Note   Patient Details  Name: JEANA STEPANIAK MRN: [id number] Date of Birth: [birth year]  Transition of Care [facility]) CM/SW Contact:  Pollie Friar, RN Phone Number: 12/09/2020, 11:22 AM   Clinical Narrative:    Patient is discharging to CIR today. CM signing off.   Final next level of care: IP Rehab Facility Barriers to Discharge: No Barriers Identified   Patient Goals and CMS Choice        Discharge Placement                       Discharge Plan and Services                                     Social Determinants of Health (SDOH) Interventions     Readmission Risk Interventions No flowsheet data found.

## 2020-12-10 DIAGNOSIS — R7989 Other specified abnormal findings of blood chemistry: Secondary | ICD-10-CM

## 2020-12-10 DIAGNOSIS — I63512 Cerebral infarction due to unspecified occlusion or stenosis of left middle cerebral artery: Secondary | ICD-10-CM | POA: Diagnosis not present

## 2020-12-10 DIAGNOSIS — I69391 Dysphagia following cerebral infarction: Secondary | ICD-10-CM

## 2020-12-10 DIAGNOSIS — I4891 Unspecified atrial fibrillation: Secondary | ICD-10-CM

## 2020-12-10 LAB — COMPREHENSIVE METABOLIC PANEL
ALT: 57 U/L — ABNORMAL HIGH (ref 0–44)
AST: 28 U/L (ref 15–41)
Albumin: 2.9 g/dL — ABNORMAL LOW (ref 3.5–5.0)
Alkaline Phosphatase: 60 U/L (ref 38–126)
Anion gap: 9 (ref 5–15)
BUN: 35 mg/dL — ABNORMAL HIGH (ref 8–23)
CO2: 27 mmol/L (ref 22–32)
Calcium: 9.5 mg/dL (ref 8.9–10.3)
Chloride: 102 mmol/L (ref 98–111)
Creatinine, Ser: 1.19 mg/dL — ABNORMAL HIGH (ref 0.44–1.00)
GFR, Estimated: 49 mL/min — ABNORMAL LOW (ref >60)
Glucose, Bld: 126 mg/dL — ABNORMAL HIGH (ref 70–99)
Potassium: 4.5 mmol/L (ref 3.5–5.1)
Sodium: 138 mmol/L (ref 135–145)
Total Bilirubin: 0.6 mg/dL (ref 0.3–1.2)
Total Protein: 6.5 g/dL (ref 6.5–8.1)

## 2020-12-10 LAB — CBC WITH DIFFERENTIAL/PLATELET
Abs Immature Granulocytes: 0.07 10*3/uL (ref 0.00–0.07)
Basophils Absolute: 0.1 10*3/uL (ref 0.0–0.1)
Basophils Relative: 1 %
Eosinophils Absolute: 0.3 10*3/uL (ref 0.0–0.5)
Eosinophils Relative: 3 %
HCT: 36.7 % (ref 36.0–46.0)
Hemoglobin: 11.7 g/dL — ABNORMAL LOW (ref 12.0–15.0)
Immature Granulocytes: 1 %
Lymphocytes Relative: 21 %
Lymphs Abs: 2 10*3/uL (ref 0.7–4.0)
MCH: 29.2 pg (ref 26.0–34.0)
MCHC: 31.9 g/dL (ref 30.0–36.0)
MCV: 91.5 fL (ref 80.0–100.0)
Monocytes Absolute: 0.8 10*3/uL (ref 0.1–1.0)
Monocytes Relative: 8 %
Neutro Abs: 6.5 10*3/uL (ref 1.7–7.7)
Neutrophils Relative %: 66 %
Platelets: 356 10*3/uL (ref 150–400)
RBC: 4.01 MIL/uL (ref 3.87–5.11)
RDW: 13.1 % (ref 11.5–15.5)
WBC: 9.6 10*3/uL (ref 4.0–10.5)
nRBC: 0 % (ref 0.0–0.2)

## 2020-12-10 MED ORDER — SODIUM CHLORIDE 0.45 % IV SOLN
INTRAVENOUS | Status: DC
  Administered 2020-12-12: 600 mL via INTRAVENOUS

## 2020-12-10 NOTE — Progress Notes (Signed)
[facility] Individual Statement of Services  Patient Name:  Candice Perez  Date:  12/10/2020  Welcome to the [facility].  Our goal is to provide you with an individualized program based on your diagnosis and situation, designed to meet your specific needs.  With this comprehensive rehabilitation program, you will be expected to participate in at least 3 hours of rehabilitation therapies Monday-Friday, with modified therapy programming on the weekends.  Your rehabilitation program will include the following services:  Physical Therapy (PT), Occupational Therapy (OT), Speech Therapy (ST), 24 hour per day rehabilitation nursing, Therapeutic Recreaction (TR), Neuropsychology, Care Coordinator, Rehabilitation Medicine, Nutrition Services, Pharmacy Services, and Other  Weekly team conferences will be held on Tuesday to discuss your progress.  Your Inpatient Rehabilitation Care Coordinator will talk with you frequently to get your input and to update you on team discussions.  Team conferences with you and your family in attendance may also be held.  Expected length of stay: 14-16 Days  Overall anticipated outcome:  Supervision  Depending on your progress and recovery, your program may change. Your Inpatient Rehabilitation Care Coordinator will coordinate services and will keep you informed of any changes. Your Inpatient Rehabilitation Care Coordinator's name and contact numbers are listed  below.  The following services may also be recommended but are not provided by the [facility]:   [place] will be made to provide these services after discharge if needed.  Arrangements include referral to agencies that provide these services.  Your insurance has been verified to be:  [employer]  Your primary doctor is:  Kennith Maes, MD  Pertinent information will  be shared with your doctor and your insurance company.  Inpatient Rehabilitation Care Coordinator:  Erlene Quan, [place] or [phone]  Information discussed with and copy given to patient by: Dyanne Iha, 12/10/2020, 12:18 PM

## 2020-12-10 NOTE — Plan of Care (Signed)
  Problem: RH Balance Goal: LTG Patient will maintain dynamic sitting balance (PT) Description: LTG:  Patient will maintain dynamic sitting balance with assistance during mobility activities (PT) Flowsheets (Taken 12/10/2020 1824) LTG: Pt will maintain dynamic sitting balance during mobility activities with:: Independent Goal: LTG Patient will maintain dynamic standing balance (PT) Description: LTG:  Patient will maintain dynamic standing balance with assistance during mobility activities (PT) Flowsheets (Taken 12/10/2020 1824) LTG: Pt will maintain dynamic standing balance during mobility activities with:: Supervision/Verbal cueing   Problem: Sit to Stand Goal: LTG:  Patient will perform sit to stand with assistance level (PT) Description: LTG:  Patient will perform sit to stand with assistance level (PT) Flowsheets (Taken 12/10/2020 1824) LTG: PT will perform sit to stand in preparation for functional mobility with assistance level: Supervision/Verbal cueing   Problem: RH Bed Mobility Goal: LTG Patient will perform bed mobility with assist (PT) Description: LTG: Patient will perform bed mobility with assistance, with/without cues (PT). Flowsheets (Taken 12/10/2020 1824) LTG: Pt will perform bed mobility with assistance level of: Supervision/Verbal cueing   Problem: RH Bed to Chair Transfers Goal: LTG Patient will perform bed/chair transfers w/assist (PT) Description: LTG: Patient will perform bed to chair transfers with assistance (PT). Flowsheets (Taken 12/10/2020 1824) LTG: Pt will perform Bed to Chair Transfers with assistance level: Supervision/Verbal cueing   Problem: RH Car Transfers Goal: LTG Patient will perform car transfers with assist (PT) Description: LTG: Patient will perform car transfers with assistance (PT). Flowsheets (Taken 12/10/2020 1824) LTG: Pt will perform car transfers with assist:: Supervision/Verbal cueing   Problem: RH Ambulation Goal: LTG Patient will  ambulate in controlled environment (PT) Description: LTG: Patient will ambulate in a controlled environment, # of feet with assistance (PT). Flowsheets (Taken 12/10/2020 1824) LTG: Pt will ambulate in controlled environ  assist needed:: Supervision/Verbal cueing LTG: Ambulation distance in controlled environment: 150 Note: LRAD Goal: LTG Patient will ambulate in home environment (PT) Description: LTG: Patient will ambulate in home environment, # of feet with assistance (PT). Flowsheets (Taken 12/10/2020 1824) LTG: Pt will ambulate in home environ  assist needed:: Supervision/Verbal cueing LTG: Ambulation distance in home environment: 50   Problem: RH Wheelchair Mobility Goal: LTG Patient will propel w/c in controlled environment (PT) Description: LTG: Patient will propel wheelchair in controlled environment, # of feet with assist (PT) Flowsheets (Taken 12/10/2020 1824) LTG: Pt will propel w/c in controlled environ  assist needed:: Supervision/Verbal cueing LTG: Propel w/c distance in controlled environment: 100   Problem: RH Stairs Goal: LTG Patient will ambulate up and down stairs w/assist (PT) Description: LTG: Patient will ambulate up and down # of stairs with assistance (PT) Flowsheets (Taken 12/10/2020 1824) LTG: Pt will ambulate up/down stairs assist needed:: Supervision/Verbal cueing LTG: Pt will  ambulate up and down number of stairs: 4   Problem: RH Awareness Goal: LTG: Patient will demonstrate awareness during functional activites type of (PT) Description: LTG: Patient will demonstrate awareness during functional activites type of (PT) Flowsheets (Taken 12/10/2020 1824) Patient will demonstrate awareness during functional activites type of: (Attention to RUE/RLE due to lack of sensation) Other (comment) LTG: Patient will demonstrate awareness during functional activites type of (PT): Supervision

## 2020-12-10 NOTE — Progress Notes (Signed)
Physical Therapy Session Note  Patient Details  Name: Candice Perez MRN: [id number] Date of Birth: 10-05-1950  Today's Date: 12/10/2020 PT Individual Time: 1400-1427 PT Individual Time Calculation (min): 27 min   Short Term Goals: Week 1:     Skilled Therapeutic Interventions/Progress Updates:     Pt greeted supine in bed to start session. Husband at bedside and pt agreeable to therapy session. Reports no pain, dysarthria and aphasia impacting communication. Supine<>sit completed with modA with HOB fully elevated and assist for primarily trunk management; impacted by body habitus. Sit<>stand in [place] with minA but needs assist as well for L foot placement. Able to stand in [place] with CGA with UE support to bar and can sit in perched position with CGA. Stedy transfer to recliner and then worked on sit<>stands from [employer] to [employer] where she required [employer] and instruction for proper hand placement. Able to stand with minA and RW statically and completed a few pre-gait tasks such as marching with RLE and lateral weight shifts bilaterally. She remained seated in recliner with BLE elevated and safety belt alarm engaged. All needs met.  Therapy Documentation Precautions:  Precautions Precautions: Fall Restrictions Weight Bearing Restrictions: No Other Position/Activity Restrictions: Right Hemiplegia    Therapy/Group: Individual Therapy  Alger Simons 12/10/2020, 2:24 PM

## 2020-12-10 NOTE — Discharge Instructions (Addendum)
Inpatient Rehab Discharge Instructions  Candice Perez Discharge date and time: No discharge date for patient encounter.   Activities/Precautions/ Functional Status: Activity: activity as tolerated Diet: mechanical soft Wound Care: Routine skin checks Functional status:  ___ No restrictions     ___ Walk up steps independently ___ 24/7 supervision/assistance   ___ Walk up steps with assistance ___ Intermittent supervision/assistance  ___ Bathe/dress independently ___ Walk with walker     __x_ Bathe/dress with assistance ___ Walk Independently    ___ Shower independently ___ Walk with assistance    ___ Shower with assistance ___ No alcohol     ___ Return to work/school ________  Special Instructions: No driving smoking or alcohol  COMMUNITY REFERRALS UPON DISCHARGE:    Home Health:   PT, OT, SP                 Agency:[facility] Phone: [phone]   Medical Equipment/Items Ordered:BARIATRIC DROP-ARM BEDSIDE [facility] & [facility]                                                 Agency/Supplier:[facility]   [phone]    STROKE/TIA DISCHARGE INSTRUCTIONS SMOKING Cigarette smoking nearly doubles your risk of having a stroke & is the single most alterable risk factor  If you smoke or have smoked in the last 12 months, you are advised to quit smoking for your health. Most of the excess cardiovascular risk related to smoking disappears within a year of stopping. Ask you doctor about anti-smoking medications [place] Quit Line: 1-800-QUIT NOW Free Smoking Cessation Classes [phone]  CHOLESTEROL Know your levels; limit fat & cholesterol in your diet  Lipid Panel     Component Value Date/Time   CHOL 142 12/03/2020 0330   TRIG 107 12/03/2020 0330   TRIG 100 12/03/2020 0330   HDL 40 (L) 12/03/2020 0330   CHOLHDL 3.6 12/03/2020 0330   VLDL 21 12/03/2020 0330   LDLCALC 81 12/03/2020 0330     Many patients benefit from treatment even if their cholesterol is at  goal. Goal: Total Cholesterol (CHOL) less than 160 Goal:  Triglycerides (TRIG) less than 150 Goal:  HDL greater than 40 Goal:  LDL (LDLCALC) less than 100   BLOOD PRESSURE American Stroke Association blood pressure target is less that 120/80 mm/Hg  Your discharge blood pressure is:  BP: 113/70 Monitor your blood pressure Limit your salt and alcohol intake Many individuals will require more than one medication for high blood pressure  DIABETES (A1c is a blood sugar average for last 3 months) Goal HGBA1c is under 7% (HBGA1c is blood sugar average for last 3 months)  Diabetes:     Lab Results  Component Value Date   HGBA1C 6.2 (H) 12/03/2020    Your HGBA1c can be lowered with medications, healthy diet, and exercise. Check your blood sugar as directed by your physician Call your physician if you experience unexplained or low blood sugars.  PHYSICAL ACTIVITY/REHABILITATION Goal is 30 minutes at least 4 days per week  Activity: Increase activity slowly, Therapies: Physical Therapy: Home Health Return to work:  Activity decreases your risk of heart attack and stroke and makes your heart stronger.  It helps control your weight and blood pressure; helps you relax and can improve your mood. Participate in a regular exercise program. Talk with your doctor about  the best form of exercise for you (dancing, walking, swimming, cycling).  DIET/WEIGHT Goal is to maintain a healthy weight  Your discharge diet is:  Diet Order             DIET DYS 2 Room service appropriate? Yes; Fluid consistency: Thin  Diet effective now                   liquids Your height is:  Height: '5\' 7"'$  (170.2 cm) Your current weight is: Weight: 132.4 kg Your Body Mass Index (BMI) is:  BMI (Calculated): 45.71 Following the type of diet specifically designed for you will help prevent another stroke. Your goal weight range is:   Your goal Body Mass Index (BMI) is 19-24. Healthy food habits can help reduce 3 risk  factors for stroke:  High cholesterol, hypertension, and excess weight.  RESOURCES Stroke/Support Group:  Call [phone]   STROKE EDUCATION PROVIDED/REVIEWED AND GIVEN TO PATIENT Stroke warning signs and symptoms How to activate emergency medical system (call 911). Medications prescribed at discharge. Need for follow-up after discharge. Personal risk factors for stroke. Pneumonia vaccine given: No Flu vaccine given: No My questions have been answered, the writing is legible, and I understand these instructions.  I will adhere to these goals & educational materials that have been provided to me after my discharge from the hospital.       My questions have been answered and I understand these instructions. I will adhere to these goals and the provided educational materials after my discharge from the hospital.  Patient/Caregiver Signature _______________________________ Date __________  Clinician Signature _______________________________________ Date __________  Please bring this form and your medication list with you to all your follow-up doctor's appointments.    ===========================================================  Information on my medicine - ELIQUIS (apixaban)  This medication education was reviewed with me or my healthcare representative as part of my discharge preparation.   Why was Eliquis prescribed for you? Eliquis was prescribed for you to reduce the risk of a blood clot forming that can cause a stroke if you have a medical condition called atrial fibrillation (a type of irregular heartbeat).  What do You need to know about Eliquis ? Take your Eliquis TWICE DAILY - one tablet in the morning and one tablet in the evening with or without food. If you have difficulty swallowing the tablet whole please discuss with your pharmacist how to take the medication safely.  Take Eliquis exactly as prescribed by your doctor and DO NOT stop taking Eliquis without  talking to the doctor who prescribed the medication.  Stopping may increase your risk of developing a stroke.  Refill your prescription before you run out.  After discharge, you should have regular check-up appointments with your healthcare provider that is prescribing your Eliquis.  In the future your dose may need to be changed if your kidney function or weight changes by a significant amount or as you get older.  What do you do if you miss a dose? If you miss a dose, take it as soon as you remember on the same day and resume taking twice daily.  Do not take more than one dose of ELIQUIS at the same time to make up a missed dose.  Important Safety Information A possible side effect of Eliquis is bleeding. You should call your healthcare provider right away if you experience any of the following: Bleeding from an injury or your nose that does not stop. Unusual colored urine (red or dark  brown) or unusual colored stools (red or black). Unusual bruising for unknown reasons. A serious fall or if you hit your head (even if there is no bleeding).  Some medicines may interact with Eliquis and might increase your risk of bleeding or clotting while on Eliquis. To help avoid this, consult your healthcare provider or pharmacist prior to using any new prescription or non-prescription medications, including herbals, vitamins, non-steroidal anti-inflammatory drugs (NSAIDs) and supplements.  This website has more information on Eliquis (apixaban): http://www.eliquis.com/eliquis/home  ================================================  Atrial Fibrillation    Atrial fibrillation is a type of heartbeat that is irregular or fast. If you have this condition, your heart beats without any order. This makes it hard for your heart to pump blood in a normal way. Atrial fibrillation may come and go, or it may become a long-lasting problem. If this condition is not treated, it can put you at higher risk for  stroke, heart failure, and other heart problems.  What are the causes? This condition may be caused by diseases that damage the heart. They include: High blood pressure. Heart failure. Heart valve disease. Heart surgery. Other causes include: Diabetes. Thyroid disease. Being overweight. Kidney disease. Sometimes the cause is not known.  What increases the risk? You are more likely to develop this condition if: You are older. You smoke. You exercise often and very hard. You have a family history of this condition. You are a man. You use drugs. You drink a lot of alcohol. You have lung conditions, such as emphysema, pneumonia, or COPD. You have sleep apnea.  What are the signs or symptoms? Common symptoms of this condition include: A feeling that your heart is beating very fast. Chest pain or discomfort. Feeling short of breath. Suddenly feeling light-headed or weak. Getting tired easily during activity. Fainting. Sweating. In some cases, there are no symptoms.  How is this treated? Treatment for this condition depends on underlying conditions and how you feel when you have atrial fibrillation. They include: Medicines to: Prevent blood clots. Treat heart rate or heart rhythm problems. Using devices, such as a pacemaker, to correct heart rhythm problems. Doing surgery to remove the part of the heart that sends bad signals. Closing an area where clots can form in the heart (left atrial appendage). In some cases, your doctor will treat other underlying conditions.  Follow these instructions at home:  Medicines Take over-the-counter and prescription medicines only as told by your doctor. Do not take any new medicines without first talking to your doctor. If you are taking blood thinners: Talk with your doctor before you take any medicines that have aspirin or NSAIDs, such as ibuprofen, in them. Take your medicine exactly as told by your doctor. Take it at the same  time each day. Avoid activities that could hurt or bruise you. Follow instructions about how to prevent falls. Wear a bracelet that says you are taking blood thinners. Or, carry a card that lists what medicines you take. Lifestyle         Do not use any products that have nicotine or tobacco in them. These include cigarettes, e-cigarettes, and chewing tobacco. If you need help quitting, ask your doctor. Eat heart-healthy foods. Talk with your doctor about the right eating plan for you. Exercise regularly as told by your doctor. Do not drink alcohol. Lose weight if you are overweight. Do not use drugs, including cannabis.  General instructions If you have a condition that causes breathing to stop for a short period  of time (apnea), treat it as told by your doctor. Keep a healthy weight. Do not use diet pills unless your doctor says they are safe for you. Diet pills may make heart problems worse. Keep all follow-up visits as told by your doctor. This is important.  Contact a doctor if: You notice a change in the speed, rhythm, or strength of your heartbeat. You are taking a blood-thinning medicine and you get more bruising. You get tired more easily when you move or exercise. You have a sudden change in weight.  Get help right away if:    You have pain in your chest or your belly (abdomen). You have trouble breathing. You have side effects of blood thinners, such as blood in your vomit, poop (stool), or pee (urine), or bleeding that cannot stop. You have any signs of a stroke. "BE FAST" is an easy way to remember the main warning signs: B - Balance. Signs are dizziness, sudden trouble walking, or loss of balance. E - Eyes. Signs are trouble seeing or a change in how you see. F - Face. Signs are sudden weakness or loss of feeling in the face, or the face or eyelid drooping on one side. A - Arms. Signs are weakness or loss of feeling in an arm. This happens suddenly and usually on  one side of the body. S - Speech. Signs are sudden trouble speaking, slurred speech, or trouble understanding what people say. T - Time. Time to call emergency services. Write down what time symptoms started. You have other signs of a stroke, such as: A sudden, very bad headache with no known cause. Feeling like you may vomit (nausea). Vomiting. A seizure.  These symptoms may be an emergency. Do not wait to see if the symptoms will go away. Get medical help right away. Call your local emergency services (911 in the [place] Do not drive yourself to the hospital. Summary Atrial fibrillation is a type of heartbeat that is irregular or fast. You are at higher risk of this condition if you smoke, are older, have diabetes, or are overweight. Follow your doctor's instructions about medicines, diet, exercise, and follow-up visits. Get help right away if you have signs or symptoms of a stroke. Get help right away if you cannot catch your breath, or you have chest pain or discomfort. This information is not intended to replace advice given to you by your health care provider. Make sure you discuss any questions you have with your health care provider. Document Revised: 10/23/2018 Document Reviewed: 10/23/2018 Elsevier Patient Education  [place].

## 2020-12-10 NOTE — Progress Notes (Signed)
Patient ID: Candice Perez, female   DOB: 03/29/1951, 70 y.o.   MRN: [id number] Met with the patient and spouse to introduce self and the role of the nurse CM. Reviewed secondary stroke risks including new A-fib, HTN, HLD (LDL 81) and prediabetes (A1C 6.2). Patient with exp aphasia and right UE weakness post stroke. Reports she was taking plavix PTA. Spouse is diabetic so they can share information and tips for cooking meals. Reviewed meds and dietary modifications recommended. Continue to follow along to discharge to address educational needs. Margarito Liner

## 2020-12-10 NOTE — Progress Notes (Signed)
Inpatient Rehabilitation Care Coordinator Assessment and Plan Patient Details  Name: Candice Perez MRN: [id number] Date of Birth: [birth year]  Today's Date: 12/10/2020  Hospital Problems: Principal Problem:   Left middle cerebral artery stroke [facility]) Active Problems:   Right hemiplegia ([place])  Past Medical History:  Past Medical History:  Diagnosis Date   Arthritis    DVT (deep venous thrombosis) (HCC)    Edema    GERD (gastroesophageal reflux disease)    OCCASIONAL - PRILOSEC IF NEEDED   Hyperlipidemia    Hypertension    no meds now   Hypothyroidism    Varicose veins    Past Surgical History:  Past Surgical History:  Procedure Laterality Date   CHOLECYSTECTOMY     Laparoscopic   CYSTOCELE REPAIR     HERNIA REPAIR     IR CT HEAD LTD  12/02/2020   IR PERCUTANEOUS ART THROMBECTOMY/INFUSION INTRACRANIAL INC DIAG ANGIO  12/02/2020   RADIOLOGY WITH ANESTHESIA N/A 12/02/2020   Procedure: IR WITH ANESTHESIA;  Surgeon: Luanne Bras, MD;  Location: [place];  Service: Radiology;  Laterality: N/A;   TONSILLECTOMY     TOTAL KNEE ARTHROPLASTY Left 08/26/2013   Procedure: LEFT TOTAL KNEE REVISION;  Surgeon: Mauri Pole, MD;  Location: [facility];  Service: Orthopedics;  Laterality: Left;   VARICOSE VEIN SURGERY     Social History:  reports that she quit smoking about 10 years ago. Her smoking use included cigarettes. She has never used smokeless tobacco. She reports that she does not drink alcohol and does not use drugs.  Family / Support Systems Spouse/Significant Other: [place] Children: [place] Anticipated Caregiver: spouse, sons (2) and DILS Ability/Limitations of Caregiver: sons works during the day. DILs may be able to assist Caregiver Availability: 24/7 Family Dynamics: some support from spouse and son  Social History Preferred language: English Religion: Baptist Read: Yes Write: Yes Employment Status: Unemployed Public relations account executive Issues:  n.a Guardian/Conservator: spouse   Abuse/Neglect Abuse/Neglect Assessment Can Be Completed: Yes Physical Abuse: Denies Verbal Abuse: Denies Sexual Abuse: Denies Exploitation of patient/patient's resources: Denies Self-Neglect: Denies  Emotional Status Pt's affect, behavior and adjustment status: patient very pleasant Recent Psychosocial Issues: depression, coping Psychiatric History: n/a Substance Abuse History: n/a  Patient / Family Perceptions, Expectations & Goals Pt/Family understanding of illness & functional limitations: yes Premorbid pt/family roles/activities: Independent in community and driving Anticipated changes in roles/activities/participation: spouse and son and able to assist with roles and task Pt/family expectations/goals: Energy: None Premorbid Home Care/DME Agencies: None Transportation available at discharge: spouse able to transport Resource referrals recommended: Neuropsychology (hx of depression and coping)  Discharge Planning Living Arrangements: Children, Spouse/significant other Support Systems: Spouse/significant other, Children Type of Residence: Private residence (1 level home, level entry) Administrator, sports: Multimedia programmer (specify) Doctor, general practice) Financial Resources: Fish farm manager, SSD Financial Screen Referred: No Living Expenses: Own Money Management: Patient, Spouse Does the patient have any problems obtaining your medications?: No Home Management: independent Patient/Family Preliminary Plans: spouse able to assist Care Coordinator Barriers to Discharge: Wound Care, Insurance for SNF coverage, Neurogenic Bowel & Bladder, Decreased caregiver support Care Coordinator Anticipated Follow Up Needs: HH/OP Expected length of stay: 14-16 Days  Clinical Impression Covering for primary SW, Becky. Sw met with patient, introduced self, and explained role. Patient lives in a one level, level  entry. Patient spouse will provided care primarily. Patient son and and DIL's able to assist intermittently. No additonal questions or concerns, SW will continue to follow  up.  Lalla Brothers [place] 12/10/2020, 1:26 PM

## 2020-12-10 NOTE — Evaluation (Signed)
Occupational Therapy Assessment and Plan  Patient Details  Name: Candice Perez MRN: [id number] Date of Birth: 12-12-1950  OT Diagnosis: cognitive deficits and hemiplegia affecting dominant side Rehab Potential: Rehab Potential (ACUTE ONLY): Good ELOS: 21-23 days   Today's Date: 12/10/2020 OT Individual Time: 4098-1191 OT Individual Time Calculation (min): 65 min     Hospital Problem: Principal Problem:   Left middle cerebral artery stroke ([place]) Active Problems:   Right hemiplegia ([place])   Past Medical History:  Past Medical History:  Diagnosis Date   Arthritis    DVT (deep venous thrombosis) ([facility])    Edema    GERD (gastroesophageal reflux disease)    OCCASIONAL - PRILOSEC IF NEEDED   Hyperlipidemia    Hypertension    no meds now   Hypothyroidism    Varicose veins    Past Surgical History:  Past Surgical History:  Procedure Laterality Date   CHOLECYSTECTOMY     Laparoscopic   CYSTOCELE REPAIR     HERNIA REPAIR     IR CT HEAD LTD  12/02/2020   IR PERCUTANEOUS ART THROMBECTOMY/INFUSION INTRACRANIAL INC DIAG ANGIO  12/02/2020   RADIOLOGY WITH ANESTHESIA N/A 12/02/2020   Procedure: IR WITH ANESTHESIA;  Surgeon: Luanne Bras, MD;  Location: [facility];  Service: Radiology;  Laterality: N/A;   TONSILLECTOMY     TOTAL KNEE ARTHROPLASTY Left 08/26/2013   Procedure: LEFT TOTAL KNEE REVISION;  Surgeon: Mauri Pole, MD;  Location: [facility];  Service: Orthopedics;  Laterality: Left;   VARICOSE VEIN SURGERY      Assessment & Plan Clinical Impression:  Candice Perez is a 70 year old right-handed female with history of hypertension, hyperlipidemia, prior TIA maintained on Plavix 05/2017, history of DVT, quit smoking approximately 10 years ago.  Per chart review patient lives with spouse.  1 level home with level entry.  Independent prior to admission and active.  Presented to [facility] 12/02/2020 with new onset atrial fibrillation with RVR and placed on Eliquis.  Patient  with acute onset right side weakness and aphasia in the ED code stroke was activated and CTA revealed LVO with occlusion of the superior division of the left middle cerebral artery.  She was transferred to [facility] for emergent mechanical thrombectomy per interventional radiology.  Follow-up MRI of the brain showed small to moderate acute cortical infarct in the left frontal parietal cortex primarily in the postcentral gyrus.  No associated hemorrhage.  Cardiology service follow-up for atrial fibrillation RVR with latest echocardiogram ejection fraction 40 to 47% grade 1 diastolic dysfunction.  The left ventricle demonstrating global hypokinesis.  Patient currently remains on Eliquis for CVA prophylaxis as well as atrial fibrillation.  Amiodarone ongoing for atrial fibrillation as well as Lanoxin with Cardizem.  Upper extremity Dopplers negative for DVT.  Hospital course mild AKI 1.32 and monitored.  Initially with nasogastric tube feeds for nutritional support her diet has been advanced to a dysphagia #2 thin liquid diet.  Therapy evaluations completed due to patient's right side weakness and aphasia was admitted for a comprehensive rehab program.  Patient transferred to CIR on 12/09/2020 .    Patient currently requires max with basic self-care skills secondary to muscle weakness, decreased cardiorespiratoy endurance, unbalanced muscle activation and decreased coordination, decreased attention to right, decreased memory, and decreased sitting balance, decreased standing balance, decreased postural control, hemiplegia, and decreased balance strategies.  Prior to hospitalization, patient was fully independent.   Patient will benefit from skilled intervention to increase independence with basic  self-care skills prior to discharge home with care partner.  Anticipate patient will require intermittent supervision and follow up outpatient.  OT - End of Session Activity Tolerance: Tolerates 10 - 20 min  activity with multiple rests Endurance Deficit: Yes Endurance Deficit Description: Pt did get somewhat short of breath with prolonged standing. OT Assessment Rehab Potential (ACUTE ONLY): Good OT Patient demonstrates impairments in the following area(s): Balance;Cognition;Endurance;Motor;Perception;Sensory OT Basic ADL's Functional Problem(s): Eating;Grooming;Bathing;Dressing;Toileting OT Transfers Functional Problem(s): Toilet;Tub/Shower OT Additional Impairment(s): Fuctional Use of Upper Extremity OT Plan OT Intensity: Minimum of 1-2 x/day, 45 to 90 minutes OT Frequency: 5 out of 7 days OT Duration/Estimated Length of Stay: 21-23 days OT Treatment/Interventions: Balance/vestibular training;Cognitive remediation/compensation;Discharge planning;Functional mobility training;DME/adaptive equipment instruction;Functional electrical stimulation;Neuromuscular re-education;Patient/family education;Psychosocial support;Therapeutic Activities;Self Care/advanced ADL retraining;Therapeutic Exercise;UE/LE Strength taining/ROM;UE/LE Coordination activities;Visual/perceptual remediation/compensation OT Self Feeding Anticipated Outcome(s): independent OT Basic Self-Care Anticipated Outcome(s): CGA OT Toileting Anticipated Outcome(s): CGA OT Bathroom Transfers Anticipated Outcome(s): CGA OT Recommendation Recommendations for Other Services: Therapeutic Recreation consult Therapeutic Recreation Interventions: [facility] Patient destination: Home Follow Up Recommendations: Outpatient OT Equipment Recommended: Tub/shower bench   OT Evaluation Precautions/Restrictions  Precautions Precautions: Fall Restrictions Weight Bearing Restrictions: No Other Position/Activity Restrictions: Right Hemiplegia  Pain Pain Assessment Pain Score: 0-No pain Home Living/Prior Functioning Home Living Family/patient expects to be discharged to:: Private residence Living Arrangements: Children, Spouse/significant  other Available Help at Discharge: Family, Available 24 hours/day Home Access: Level entry Home Layout: One level Bathroom Shower/Tub: Gaffer, Chiropodist: Handicapped height Bathroom Accessibility: Yes (per chart review. Pt reported narrow bathroom)  Lives With: Spouse Prior Function Level of Independence: Independent with basic ADLs, Independent with homemaking with ambulation, Independent with gait, Independent with transfers  Able to Take Stairs?: Yes Driving: Yes Vocation: Retired Comments: retired, enjoys gardening and reading, prescription glassess at baseline Vision Baseline Vision/History: Wears glasses Wears Glasses: Reading only (Pt reports reading only) Patient Visual Report: No change from baseline Vision Assessment?: Yes Eye Alignment: Within Functional Limits Ocular Range of Motion: Within Functional Limits Alignment/Gaze Preference: Within Defined Limits Tracking/Visual Pursuits: Able to track stimulus in all quads without difficulty Convergence: Impaired (comment) (decreased convergence on right eye) Visual Fields: No apparent deficits Perception  Perception: Impaired Inattention/Neglect: Does not attend to right side of body (slight inattention to R side due to impaired sensation/proprioception) Praxis Praxis: Intact Cognition Overall Cognitive Status: Impaired/Different from baseline Arousal/Alertness: Awake/alert Orientation Level: Person;Place;Situation Person: Oriented Place: Oriented Situation: Oriented Year: 2022 Month: July Day of Week: Correct Memory: Impaired Memory Impairment: Storage deficit;Retrieval deficit Immediate Memory Recall: Sock;Blue;Bed Memory Recall Sock: With Cue Memory Recall Blue: Without Cue Memory Recall Bed: Without Cue Safety/Judgment: Impaired Sensation Sensation Light Touch: Impaired by gross assessment Light Touch Impaired Details: Impaired RLE;Absent RUE Hot/Cold: Impaired by gross  assessment Proprioception: Impaired by gross assessment Stereognosis: Impaired by gross assessment Additional Comments: RUE sensation very impaired, pt not able to feel a deep pinch Coordination Gross Motor Movements are Fluid and Coordinated: No Fine Motor Movements are Fluid and Coordinated: No Coordination and Movement Description: RUE able to reach to opposite shoulder but unable to coordinate with holding washcloth and reaching to wash LUE Motor  Motor Motor: Hemiplegia  Trunk/Postural Assessment  Cervical Assessment Cervical Assessment: Within Functional Limits Thoracic Assessment Thoracic Assessment: Within Functional Limits Lumbar Assessment Lumbar Assessment: Within Functional Limits Postural Control Postural Control: Deficits on evaluation (leans slightly to the Right, pt does not weight shift out of her base of support (fear vs weakness?))  Balance Static Sitting Balance Static Sitting - Level of Assistance: 5: Stand by assistance Dynamic Sitting Balance Dynamic Sitting - Level of Assistance: 4: Min assist Static Standing Balance Static Standing - Level of Assistance: 4: Min assist Dynamic Standing Balance Dynamic Standing - Level of Assistance: 3: Mod assist Extremity/Trunk Assessment RUE Assessment RUE Assessment: Exceptions to [facility] Passive Range of Motion (PROM) Comments: WFL Active Range of Motion (AROM) Comments: sh flexion to 140, abduction to 90 General Strength Comments: pt has 2+/5 grasp, 3+/5 elbow flexion, able to push and pull arm with resistance RUE Body System: Neuro Brunstrum levels for arm and hand: Arm;Hand Brunstrum level for arm: Stage IV Movement is deviating from synergy Brunstrum level for hand: Stage IV Movements deviating from synergies LUE Assessment LUE Assessment: Within Functional Limits  Care Tool Care Tool Self Care Eating   Eating Assist Level: Supervision/Verbal cueing    Oral Care    Oral Care Assist Level: Minimal Assistance  - Patient > 75%    Bathing   Body parts bathed by patient: Chest;Abdomen;Left upper leg;Face;Right arm;Right upper leg Body parts bathed by helper: Left arm;Front perineal area;Buttocks;Left lower leg;Right lower leg   Assist Level: Moderate Assistance - Patient 50 - 74%    Upper Body Dressing(including orthotics)   What is the patient wearing?: Bra;Pull over shirt   Assist Level: Maximal Assistance - Patient 25 - 49%    Lower Body Dressing (excluding footwear)   What is the patient wearing?: Incontinence brief;Pants Assist for lower body dressing: Dependent - Patient 0%    Putting on/Taking off footwear   What is the patient wearing?: Non-skid slipper socks Assist for footwear: Dependent - Patient 0%       Care Tool Toileting Toileting activity   Assist for toileting: Dependent - Patient 0%     Care Tool Bed Mobility Roll left and right activity   Roll left and right assist level: Maximal Assistance - Patient 25 - 49%    Sit to lying activity   Sit to lying assist level: Maximal Assistance - Patient 25 - 49%    Lying to sitting edge of bed activity   Lying to sitting edge of bed assist level: Maximal Assistance - Patient 25 - 49%     Care Tool Transfers Sit to stand transfer   Sit to stand assist level: Moderate Assistance - Patient 50 - 74%    Chair/bed transfer   Chair/bed transfer assist level: Moderate Assistance - Patient 50 - 74%     Toilet transfer   Assist Level: Maximal Assistance - Patient 24 - 49%     Care Tool Cognition Expression of Ideas and Wants Expression of Ideas and Wants: Frequent difficulty - frequently exhibits difficulty with expressing needs and ideas   Understanding Verbal and Non-Verbal Content Understanding Verbal and Non-Verbal Content: Usually understands - understands most conversations, but misses some part/intent of message. Requires cues at times to understand   Memory/Recall Ability *first 3 days only Memory/Recall Ability  *first 3 days only: Current season;That he or she is in a hospital/hospital unit    Refer to Care Plan for [place] 1 OT Short Term Goal 1 (Week 1): Pt will don shirt with min A. OT Short Term Goal 2 (Week 1): Pt will sit to stand with min to prep for toilet transfer. OT Short Term Goal 3 (Week 1): Pt will complete toileting with mod A. OT Short Term Goal 4 (Week 1): Pt  will don pants with mod A. OT Short Term Goal 5 (Week 1): Pt will use RUE to wash LUE with guiding A.  Recommendations for other services: Surveyor, mining group   Skilled Therapeutic Intervention ADL ADL Eating: Supervision/safety Grooming: Minimal assistance Upper Body Bathing: Moderate assistance Where Assessed-Upper Body Bathing: Wheelchair Lower Body Bathing: Maximal assistance Where Assessed-Lower Body Bathing: Wheelchair Upper Body Dressing: Maximal assistance Where Assessed-Upper Body Dressing: Wheelchair Lower Body Dressing: Dependent Where Assessed-Lower Body Dressing: Wheelchair Toileting: Dependent Where Assessed-Toileting: Bedside Commode Toilet Transfer: Maximal assistance Toilet Transfer Method: Stand pivot Mobility  Bed Mobility Bed Mobility: Rolling Right;Rolling Left;Sit to Supine Rolling Right: Minimal Assistance - Patient > 75% Rolling Left: Moderate Assistance - Patient 50-74% Sit to Supine: Minimal Assistance - Patient > 75% Transfers Sit to Stand: Minimal Assistance - Patient > 75% Stand to Sit: Minimal Assistance - Patient > 75%  Pt seen for initial eval and ADL training. See ADL documentation for details.  Pt does have dysarthria and word finding difficulties but does fairly well expressing herself.  Explained role of OT, discussed goals and ELOS. Pt resting in wc with all needs met.  Belt alarm on.  Discharge Criteria: Patient will be discharged from OT if patient refuses treatment 3 consecutive times without medical reason, if treatment  goals not met, if there is a change in medical status, if patient makes no progress towards goals or if patient is discharged from hospital.  The above assessment, treatment plan, treatment alternatives and goals were discussed and mutually agreed upon: by patient  [facility] 12/10/2020, 12:35 PM

## 2020-12-10 NOTE — Progress Notes (Signed)
PROGRESS NOTE   Subjective/Complaints: Doing fairly well. Right arm a little sore from IV, healing wound. Able to sleep. Mood is good  ROS: limited sl by language. No sob, cp, n,v,d   Objective:   VAS [place] UPPER EXTREMITY VENOUS DUPLEX  Result Date: 12/08/2020 UPPER VENOUS STUDY  Patient Name:  Candice Perez  Date of Exam:   12/08/2020 Medical Rec #: [id number]         Accession #:    [id number] Date of Birth: 1950-07-21         Patient Gender: F Patient Age:   70Y Exam Location:  [facility] Procedure:      VAS [place] UPPER EXTREMITY VENOUS DUPLEX Referring Phys: [id number] Chrystie Nose PAYNE --------------------------------------------------------------------------------  Indications: "phlebitis" per MD order Limitations: Body habitus, poor ultrasound/tissue interface and immobility. Comparison Study: No previous exams Performing Technologist: Jody Hill RVT, RDMS  Examination Guidelines: A complete evaluation includes B-mode imaging, spectral Doppler, color Doppler, and power Doppler as needed of all accessible portions of each vessel. Bilateral testing is considered an integral part of a complete examination. Limited examinations for reoccurring indications may be performed as noted.  Right Findings: +----------+------------+---------+-----------+----------+---------------------+ RIGHT     CompressiblePhasicitySpontaneousProperties       Summary        +----------+------------+---------+-----------+----------+---------------------+ IJV           Full       Yes       Yes                                    +----------+------------+---------+-----------+----------+---------------------+ Subclavian    Full       Yes       Yes                                    +----------+------------+---------+-----------+----------+---------------------+ Axillary      Full       Yes       Yes                                     +----------+------------+---------+-----------+----------+---------------------+ Brachial                 Yes       Yes                Compression image                                                       lost, patent by color  and doppler      +----------+------------+---------+-----------+----------+---------------------+ Radial        Full                                                        +----------+------------+---------+-----------+----------+---------------------+ Ulnar         Full                                                        +----------+------------+---------+-----------+----------+---------------------+ Cephalic      Full                                                        +----------+------------+---------+-----------+----------+---------------------+ Basilic       Full       Yes       Yes                                    +----------+------------+---------+-----------+----------+---------------------+  Left Findings: +----------+------------+---------+-----------+----------+---------------+ LEFT      CompressiblePhasicitySpontaneousProperties    Summary     +----------+------------+---------+-----------+----------+---------------+ IJV           Full       Yes       Yes                              +----------+------------+---------+-----------+----------+---------------+ Subclavian    Full       Yes       Yes                              +----------+------------+---------+-----------+----------+---------------+ Axillary      Full       Yes       Yes                              +----------+------------+---------+-----------+----------+---------------+ Brachial      Full       Yes       Yes                              +----------+------------+---------+-----------+----------+---------------+ Radial        Full                                                   +----------+------------+---------+-----------+----------+---------------+ Ulnar         Full                                                  +----------+------------+---------+-----------+----------+---------------+  Cephalic      None       No        No               Forearm - Acute +----------+------------+---------+-----------+----------+---------------+ Basilic       Full       Yes       Yes                              +----------+------------+---------+-----------+----------+---------------+  Summary:  Right: No evidence of deep vein thrombosis in the upper extremity. No evidence of superficial vein thrombosis in the upper extremity.  Left: No evidence of deep vein thrombosis in the upper extremity. Findings consistent with acute superficial vein thrombosis involving the left cephalic vein at the level of the forearm.  *See table(s) above for measurements and observations.  Diagnosing physician: Jamelle Haring Electronically signed by Jamelle Haring on 12/08/2020 at 3:56:38 PM.    Final    Recent Labs    12/09/20 0327 12/10/20 0446  WBC 8.0 9.6  HGB 11.3* 11.7*  HCT 36.6 36.7  PLT 327 356   Recent Labs    12/09/20 0327 12/10/20 0446  NA 137 138  K 4.8 4.5  CL 99 102  CO2 31 27  GLUCOSE 118* 126*  BUN 32* 35*  CREATININE 1.32* 1.19*  CALCIUM 9.8 9.5    Intake/Output Summary (Last 24 hours) at 12/10/2020 1202 Last data filed at 12/10/2020 0815 Gross per 24 hour  Intake 240 ml  Output --  Net 240 ml        Physical Exam: Vital Signs Blood pressure 113/70, pulse (!) 56, temperature 98.1 F (36.7 C), temperature source Oral, resp. rate 16, height '5\' 7"'$  (1.702 m), weight 131.1 kg, SpO2 93 %.  General: Alert and oriented x 3, No apparent distress HEENT: Head is normocephalic, atraumatic, PERRLA, EOMI, sclera anicteric, oral mucosa pink and moist, dentition intact, ext ear canals clear,  Neck: Supple without JVD or lymphadenopathy Heart: IRR  IRR. No murmurs rubs or gallops Chest: CTA bilaterally without wheezes, rales, or rhonchi; no distress Abdomen: Soft, non-tender, non-distended, bowel sounds positive. Extremities: No clubbing, cyanosis, or edema. Pulses are 2+ Psych: Pt's affect is appropriate. Pt is cooperative Skin: NSL Right wrist. Healing abrasion/wound right forearm, sl tender Neuro: right central 7, word finding deficits. Speech non-fluent. Pretty accurate with y/n answers.  Reflexes are 2+ in all 4's. Fine motor coordination is intact. No tremors. Motor function is grossly 5/5 LUE and LLE. RUE 3+ to 4- prox to 1-2/5 at wrist and HI. RLE: 3-4/5 prox to distal. Sensation sl decreased to LT/PP RUE and RLE Musculoskeletal: tenderness with ROM RUE.   Assessment/Plan: 1. Functional deficits which require 3+ hours per day of interdisciplinary therapy in a comprehensive inpatient rehab setting. Physiatrist is providing close team supervision and 24 hour management of active medical problems listed below. Physiatrist and rehab team continue to assess barriers to discharge/monitor patient progress toward functional and medical goals  Care Tool:  Bathing              Bathing assist Assist Level: Maximal Assistance - Patient 24 - 49%     Upper Body Dressing/Undressing Upper body dressing   What is the patient wearing?: Hospital gown only    Upper body assist      Lower Body Dressing/Undressing Lower body dressing  Lower body assist       Toileting Toileting    Toileting assist       Transfers Chair/bed transfer  Transfers assist           Locomotion Ambulation   Ambulation assist      Assist level: Maximal Assistance - Patient 25 - 49%       Walk 10 feet activity   Assist           Walk 50 feet activity   Assist           Walk 150 feet activity   Assist           Walk 10 feet on uneven surface  activity   Assist            Wheelchair     Assist               Wheelchair 50 feet with 2 turns activity    Assist            Wheelchair 150 feet activity     Assist          Blood pressure 113/70, pulse (!) 56, temperature 98.1 F (36.7 C), temperature source Oral, resp. rate 16, height '5\' 7"'$  (1.702 m), weight 131.1 kg, SpO2 93 %.  Medical Problem List and Plan: 1.  Right side weakness and aphasia secondary to left MCA infarct due to left MCA occlusion status post thrombectomy with TIC12c etiology likely due to newly diagnosed atrial fibrillation             -patient may  shower             -ELOS/Goals: 14-16 days- supervision  -Patient is beginning CIR therapies today including PT, OT, and SLP  2.  Antithrombotics: -DVT/anticoagulation: Eliquis             -antiplatelet therapy: N/A 3. Pain Management: Tylenol as needed 4. Mood: Provide emotional support             -antipsychotic agents: N/A 5. Neuropsych: This patient is not ? capable of making decisions on her own behalf. 6. Skin/Wound Care: Local measures as needed RUE.  7. Fluids/Electrolytes/Nutrition: encourage appropriate PO  -I personally reviewed the patient's labs today.   8.  New onset atrial fibrillation.  Amiodarone 200 mg daily, Lanoxin 0.125 mg daily, Cardizem 240 mg daily, Lopressor 25 mg twice daily.   -rate controlled. Avoid excessive brady  9.  Hypothyroidism.  Synthroid 10.  Dysphagia.  Dysphagia #2 thin liquids.  Follow-up speech therapy- chewing inside mouth- monitor closely for pocketing? 11.  Hyperlipidemia.  Crestor 12.  Morbid obesity.  BMI 45.75.  Dietary follow-up 13.  AKI/prerenal.  BUN/Cr slightly more elevated today -encourage PO fluids  -will run IVF at HS until she catches up a bit  -check labs again monday       LOS: 1 days A FACE TO [place] 12/10/2020, 12:02 PM

## 2020-12-10 NOTE — Progress Notes (Signed)
Responded to spiritual care consult to assist wit AD.Marland Kitchen  Left AD with patient. Patient did not request. Pt will take document home and get family to assist with completion. Chaplain gave instruction on how to proceed.  Jaclynn Major, [place], [facility], Pager [phone]

## 2020-12-10 NOTE — Evaluation (Signed)
Physical Therapy Assessment and Plan  Patient Details  Name: Candice Perez MRN: [id number] Date of Birth: [birth year]  PT Diagnosis: Abnormal posture, Abnormality of gait, Coordination disorder, Difficulty walking, Hemiplegia dominant, Impaired cognition, Impaired sensation, Muscle weakness, and Pain in bilateral hands. Rehab Potential: Good ELOS: 12-16   Today's Date: 12/10/2020 PT Individual Time: 2951-8841 PT Individual Time Calculation (min): 27 min    Hospital Problem: Principal Problem:   Left middle cerebral artery stroke (HCC) Active Problems:   Right hemiplegia (HCC)   Past Medical History:  Past Medical History:  Diagnosis Date   Arthritis    DVT (deep venous thrombosis) (HCC)    Edema    GERD (gastroesophageal reflux disease)    OCCASIONAL - PRILOSEC IF NEEDED   Hyperlipidemia    Hypertension    no meds now   Hypothyroidism    Varicose veins    Past Surgical History:  Past Surgical History:  Procedure Laterality Date   CHOLECYSTECTOMY     Laparoscopic   CYSTOCELE REPAIR     HERNIA REPAIR     IR CT HEAD LTD  12/02/2020   IR PERCUTANEOUS ART THROMBECTOMY/INFUSION INTRACRANIAL INC DIAG ANGIO  12/02/2020   RADIOLOGY WITH ANESTHESIA N/A 12/02/2020   Procedure: IR WITH ANESTHESIA;  Surgeon: Luanne Bras, MD;  Location: [place];  Service: Radiology;  Laterality: N/A;   TONSILLECTOMY     TOTAL KNEE ARTHROPLASTY Left 08/26/2013   Procedure: LEFT TOTAL KNEE REVISION;  Surgeon: Mauri Pole, MD;  Location: [facility];  Service: Orthopedics;  Laterality: Left;   VARICOSE VEIN SURGERY      Assessment & Plan Clinical Impression: HPI: Candice Perez is a 70 year old right-handed female with history of hypertension, hyperlipidemia, prior TIA maintained on Plavix 05/2017, history of DVT, quit smoking approximately 10 years ago.  Per chart review patient lives with spouse.  1 level home with level entry.  Independent prior to admission and active.  Presented to [facility] 12/02/2020 with new onset atrial fibrillation with RVR and placed on Eliquis.  Patient with acute onset right side weakness and aphasia in the ED code stroke was activated and CTA revealed LVO with occlusion of the superior division of the left middle cerebral artery.  She was transferred to [facility] for emergent mechanical thrombectomy per interventional radiology.  Follow-up MRI of the brain showed small to moderate acute cortical infarct in the left frontal parietal cortex primarily in the postcentral gyrus.  No associated hemorrhage.  Cardiology service follow-up for atrial fibrillation RVR with latest echocardiogram ejection fraction 40 to 66% grade 1 diastolic dysfunction.  The left ventricle demonstrating global hypokinesis.  Patient currently remains on Eliquis for CVA prophylaxis as well as atrial fibrillation.  Amiodarone ongoing for atrial fibrillation as well as Lanoxin with Cardizem.  Upper extremity Dopplers negative for DVT.  Hospital course mild AKI 1.32 and monitored.  Initially with nasogastric tube feeds for nutritional support her diet has been advanced to a dysphagia #2 thin liquid diet.  Therapy evaluations completed due to patient's right side weakness and aphasia was admitted for a comprehensive rehab program. Patient transferred to CIR on 12/09/2020 .   Patient currently requires min with mobility secondary to muscle weakness, decreased cardiorespiratoy endurance, impaired timing and sequencing, motor apraxia, decreased coordination, and decreased motor planning, decreased attention to right, decreased safety awareness and decreased memory, and decreased sitting balance, decreased standing balance, decreased postural control, hemiplegia, and decreased balance strategies.  Prior to hospitalization, patient was  independent  with mobility and lived with Spouse in a House home.  Home access is  Level entry.  Patient will benefit from skilled PT intervention to maximize  safe functional mobility, minimize fall risk, and decrease caregiver burden for planned discharge home with 24 hour supervision.  Anticipate patient will benefit from follow up [place] at discharge.  PT - End of Session Activity Tolerance: Tolerates < 10 min activity, no significant change in vital signs Endurance Deficit: Yes Endurance Deficit Description: Pt short of breath with activities requiring frequent rest breaks PT Assessment Rehab Potential (ACUTE/IP ONLY): Good PT Barriers to Discharge: Home environment access/layout;Decreased caregiver support PT Patient demonstrates impairments in the following area(s): Balance;Behavior;Edema;Endurance;Motor;Pain;Perception;Safety;Sensory;Skin Integrity PT Transfers Functional Problem(s): Bed Mobility;Bed to Chair;Car;Furniture;Floor PT Locomotion Functional Problem(s): Ambulation;Wheelchair Mobility;Stairs PT Plan PT Intensity: Minimum of 1-2 x/day ,45 to 90 minutes PT Frequency: 5 out of 7 days PT Duration Estimated Length of Stay: 12-16 PT Treatment/Interventions: Ambulation/gait training;Discharge planning;Functional mobility training;Psychosocial support;Therapeutic Activities;Visual/perceptual remediation/compensation;Balance/vestibular training;Disease management/prevention;Neuromuscular re-education;Skin care/wound management;Therapeutic Exercise;Wheelchair propulsion/positioning;Cognitive remediation/compensation;DME/adaptive equipment instruction;Pain management;Splinting/orthotics;UE/LE Strength taining/ROM;Community reintegration;Functional electrical stimulation;Patient/family education;Stair training;UE/LE Coordination activities PT Transfers Anticipated Outcome(s): Supervision PT Locomotion Anticipated Outcome(s): Supervision with LRAD PT Recommendation Follow Up Recommendations: Home health PT (depending on transportation) Patient destination: Home Equipment Recommended: To be determined;Rolling walker with 5" wheels   PT  Evaluation Precautions/Restrictions Precautions Precautions: Fall Restrictions Weight Bearing Restrictions: No Other Position/Activity Restrictions: Right Hemiplegia General Chart Reviewed: Yes Additional Pertinent History: hx of afib Family/Caregiver Present: No Pain Pain Assessment Pain Scale: 0-10 Pain Score: 0-No pain Home Living/Prior Functioning Home Living Available Help at Discharge: Family;Available 24 hours/day Type of Home: House Home Access: Level entry Home Layout: One level Bathroom Shower/Tub: Walk-in shower;Tub/shower unit Bathroom Toilet: Handicapped height Bathroom Accessibility: Yes Additional Comments: Per chart review. Pt reported living in apartment and narrow bathroom unaccessible via RW  Lives With: Spouse Prior Function Level of Independence: Independent with basic ADLs;Independent with homemaking with ambulation;Independent with gait;Independent with transfers  Able to Take Stairs?: Yes Driving: Yes Vocation: Retired Vision/Perception  Vision - Risk analyst: Within Functional Limits Ocular Range of Motion: Within Functional Limits Alignment/Gaze Preference: Within Defined Limits Tracking/Visual Pursuits: Able to track stimulus in all quads without difficulty;Decreased smoothness of horizontal tracking Convergence: Impaired (comment) (decreased convergence on right eye) Perception Inattention/Neglect: Does not attend to right side of body (slight inattention to R side due to impaired sensation/proprioception) Praxis Praxis: Impaired Praxis Impairment Details: Motor planning  Cognition Overall Cognitive Status: Impaired/Different from baseline Arousal/Alertness: Awake/alert Orientation Level: Oriented to person;Oriented to place;Oriented to situation Attention: Sustained;Selective Sustained Attention: Appears intact Selective Attention: Impaired Selective Attention Impairment: Verbal basic;Verbal complex Memory: Impaired Memory  Impairment: Retrieval deficit Awareness: Impaired Awareness Impairment: Emergent impairment Problem Solving: Appears intact Safety/Judgment: Impaired Sensation Sensation Light Touch: Impaired by gross assessment (impaired sensation RLE) Light Touch Impaired Details: Impaired RLE;Absent RUE;Absent RLE (heavily impaired sensation RLE, able to sense deep pressure in feet, unable to state specific location) Coordination Gross Motor Movements are Fluid and Coordinated: No Fine Motor Movements are Fluid and Coordinated: No Coordination and Movement Description: decreased coordination and smooth movement patterns with UE during brushing teeth and wiping face Heel Shin Test: impaired - most likely due to severely impaired sensation Motor  Motor Motor: Hemiplegia   Trunk/Postural Assessment  Cervical Assessment Cervical Assessment: Within Functional Limits Thoracic Assessment Thoracic Assessment: Exceptions to [facility] (rounded shoulders) Lumbar Assessment Lumbar Assessment: Exceptions to [facility] (Posterior pelvic tilt)  Balance Standardized Balance Assessment Standardized Balance  Assessment:  (5XSTS 37 seconds) Static Sitting Balance Static Sitting - Level of Assistance: 5: Stand by assistance Static Standing Balance Static Standing - Level of Assistance: 4: Min assist Extremity Assessment  RLE Assessment RLE Assessment: Exceptions to [facility] RLE Strength Right Hip Flexion: 4/5 Right Hip ABduction: 4+/5 Right Hip ADduction: 4+/5 Right Knee Flexion: 4/5 Right Knee Extension: 4/5 Right Ankle Dorsiflexion: 4-/5 LLE Assessment LLE Assessment: Exceptions to [facility] LLE Strength Left Hip Flexion: 4+/5 Left Hip ABduction: 5/5 Left Hip ADduction: 5/5 Left Knee Flexion: 5/5 Left Knee Extension: 5/5 Left Ankle Dorsiflexion: 5/5  Care Tool Care Tool Bed Mobility Roll left and right activity   Roll left and right assist level: Moderate Assistance - Patient 50 - 74%    Sit to lying activity   Sit  to lying assist level: Minimal Assistance - Patient > 75%    Lying to sitting edge of bed activity  N/A       Care Tool Transfers Sit to stand transfer   Sit to stand assist level: Minimal Assistance - Patient > 75%    Chair/bed transfer   Chair/bed transfer assist level: Minimal Assistance - Patient > 75%     Toilet transfer  N/A      Geneticist, molecular transfer assist level: Maximal Assistance - Patient 25 - 49%      Care Tool Locomotion Ambulation   Assist level: Minimal Assistance - Patient > 75% Assistive device: Walker-rolling Max distance: 30  Walk 10 feet activity   Assist level: Minimal Assistance - Patient > 75% Assistive device: Walker-rolling   Walk 50 feet with 2 turns activity Walk 50 feet with 2 turns activity did not occur: Safety/medical concerns      Walk 150 feet activity Walk 150 feet activity did not occur: Safety/medical concerns      Walk 10 feet on uneven surfaces activity Walk 10 feet on uneven surfaces activity did not occur: Safety/medical concerns      Stairs Stair activity did not occur: Safety/medical concerns        Walk up/down 1 step activity Walk up/down 1 step or curb (drop down) activity did not occur: Safety/medical concerns     Walk up/down 4 steps activity did not occuR: Safety/medical concerns  Walk up/down 4 steps activity      Walk up/down 12 steps activity Walk up/down 12 steps activity did not occur: Safety/medical concerns      Pick up small objects from floor Pick up small object from the floor (from standing position) activity did not occur: Safety/medical concerns      Wheelchair Will patient use wheelchair at discharge?: No   Wheelchair activity did not occur: N/A      Wheel 50 feet with 2 turns activity Wheelchair 50 feet with 2 turns activity did not occur: N/A    Wheel 150 feet activity Wheelchair 150 feet activity did not occur: N/A      Refer to Care Plan for Long Term Goals  SHORT TERM GOAL WEEK  1 PT Short Term Goal 1 (Week 1): Pt will complete bed mobility MinA PT Short Term Goal 2 (Week 1): Pt will ambulate 10f CGA PT Short Term Goal 3 (Week 1): Pt will initiate stair training PT Short Term Goal 4 (Week 1): Pt will transition STS CGA  Recommendations for other services: None   Skilled Therapeutic Intervention Pt  received sitting in wc and agreeable to therapy. Evaluation completed (see details above and below) with education  on PT POC and goals and individual treatment initiated with focus on bed mobility, balance, transfers, and ambulation. Gait belt placed prior to initiation of mobility.  Pt completed 5xSTS: 37seconds (A score of 15 seconds or greater indicates patient is at an increased risk for falls. Education provided to patient on interpretation of balance score)   Car transfer [place], mainly for manual facilitation during terminal position.   Gait completed with RW 2x76f minA after skilled verbal and tactile cuing for posture, RW management (will benefit from hand splint on RW), and increased step length bilaterally.  Manual facilitation for weight shifting laterally and anteriorly to promote improved swing through phase for RLE.  Noted difficulty word finding due to expressive aphasia. Dysarthria noted throughout session with slurring words. Pt demonstrated some difficulty with mutli-step, able to complete simple commands. Pt demonstrated difficulty with motor planning, more severely noted with brushing teeth activity during session.    Pt returned to supine in bed with HOB elevated. Bed alarm on and call bell within reach.   Mobility Bed Mobility Bed Mobility: Rolling Right;Rolling Left;Sit to Supine Rolling Right: Minimal Assistance - Patient > 75% Rolling Left: Moderate Assistance - Patient 50-74% Sit to Supine: Minimal Assistance - Patient > 75% Transfers Transfers: Sit to Stand;Stand to Sit;Stand Pivot Transfers Sit to Stand: Minimal Assistance - Patient >  75% Stand to Sit: Minimal Assistance - Patient > 75% Stand Pivot Transfers: Minimal Assistance - Patient > 75% Stand Pivot Transfer Details: Tactile cues for posture;Visual cues/gestures for precautions/safety;Verbal cues for sequencing;Verbal cues for technique;Verbal cues for precautions/safety;Verbal cues for safe use of DME/AE;Manual facilitation for weight shifting Transfer (Assistive device): Rolling walker Locomotion  Gait Ambulation: Yes Gait Assistance: Minimal Assistance - Patient > 75% Gait Distance (Feet): 30 Feet Assistive device: Rolling walker Gait Assistance Details: Tactile cues for weight shifting;Visual cues for safe use of DME/AE;Verbal cues for sequencing;Verbal cues for technique;Verbal cues for precautions/safety;Verbal cues for safe use of DME/AE;Verbal cues for gait pattern Gait Gait: Yes Gait Pattern: Decreased step length - right;Decreased step length - left;Decreased stance time - right;Decreased weight shift to right;Decreased weight shift to left;Shuffle;Decreased trunk rotation;Trunk rotated posteriorly on right Gait velocity: decreased Stairs / Additional Locomotion Stairs: No Wheelchair Mobility Wheelchair Mobility: No   Discharge Criteria: Patient will be discharged from PT if patient refuses treatment 3 consecutive times without medical reason, if treatment goals not met, if there is a change in medical status, if patient makes no progress towards goals or if patient is discharged from hospital.  The above assessment, treatment plan, treatment alternatives and goals were discussed and mutually agreed upon: by patient  KHenrene Pastor SPT 12/10/2020, 6:15 PM

## 2020-12-10 NOTE — IPOC Note (Addendum)
Overall Plan of Care [facility]) Patient Details Name: Candice Perez MRN: [id number] DOB: [birth year]  Admitting Diagnosis: Left middle cerebral artery stroke [facility])  Hospital Problems: Principal Problem:   Left middle cerebral artery stroke [facility]) Active Problems:   Right hemiplegia ([place])     Functional Problem List: Nursing Bowel, Endurance, Medication Management, Nutrition, Pain, Bladder  PT Balance, Behavior, Edema, Endurance, Motor, Pain, Perception, Safety, Sensory, Skin Integrity  OT Balance, Cognition, Endurance, Motor, Perception, Sensory  SLP Cognition, Safety, Linguistic, Sensory, Perception  TR         Basic ADL's: OT Eating, Grooming, Bathing, Dressing, Toileting     Advanced  ADL's: OT       Transfers: PT Bed Mobility, Bed to Chair, Car, Furniture, Futures trader, Metallurgist: PT Ambulation, Emergency planning/management officer, Stairs     Additional Impairments: OT Fuctional Use of Upper Extremity  SLP Swallowing, Communication comprehension, expression    TR      Anticipated Outcomes Item Anticipated Outcome  Self Feeding independent  Swallowing  mod I   Basic self-care  CGA  Toileting  CGA   Bathroom Transfers CGA  Bowel/Bladder  manage bowel with mod I and bladder with cue/reminders  Transfers  Supervision  Locomotion  Supervision with LRAD  Communication  mod I basic expression/comprehension, supervision to min A sentence and conversational level expression  Cognition  supervision to mod I  Pain  at or below level 4  Safety/Judgment  maintain safety with cues/reminders   Therapy Plan: PT Intensity: Minimum of 1-2 x/day ,45 to 90 minutes PT Frequency: 5 out of 7 days PT Duration Estimated Length of Stay: 12-16 OT Intensity: Minimum of 1-2 x/day, 45 to 90 minutes OT Frequency: 5 out of 7 days OT Duration/Estimated Length of Stay: 21-23 days SLP Intensity: Minumum of 1-2 x/day, 30 to 90 minutes SLP Frequency: 3 to 5 out of 7  days SLP Duration/Estimated Length of Stay: 3 weeks   Due to the current state of emergency, patients may not be receiving their 3-hours of Medicare-mandated therapy.   Team Interventions: Nursing Interventions Patient/Family Education, Bowel Management, Pain Management, Dysphagia/Aspiration Precaution Training, Medication Management, Disease Management/Prevention, Bladder Management, Discharge Planning  PT interventions Ambulation/gait training, Discharge planning, Functional mobility training, Psychosocial support, Therapeutic Activities, Visual/perceptual remediation/compensation, Balance/vestibular training, Disease management/prevention, Neuromuscular re-education, Skin care/wound management, Therapeutic Exercise, Wheelchair propulsion/positioning, Cognitive remediation/compensation, DME/adaptive equipment instruction, Pain management, Splinting/orthotics, UE/LE Strength taining/ROM, Community reintegration, Technical sales engineer stimulation, Patient/family education, IT trainer, UE/LE Coordination activities  OT Interventions Training and development officer, Cognitive remediation/compensation, Discharge planning, Functional mobility training, DME/adaptive equipment instruction, Functional electrical stimulation, Neuromuscular re-education, Patient/family education, Psychosocial support, Therapeutic Activities, Self Care/advanced ADL retraining, Therapeutic Exercise, UE/LE Strength taining/ROM, UE/LE Coordination activities, Visual/perceptual remediation/compensation  SLP Interventions Cognitive remediation/compensation, Dysphagia/aspiration precaution training, Speech/Language facilitation, Environmental controls, Patient/family education, Functional tasks  TR Interventions    SW/CM Interventions Discharge Planning, Psychosocial Support, Patient/Family Education, Disease Management/Prevention   Barriers to Discharge MD  Medical stability  Nursing Decreased caregiver support, Incontinence 1  level; level entry w spoue  PT Home environment access/layout, Decreased caregiver support    OT      SLP      SW Wound Care, Insurance for SNF coverage, Neurogenic Bowel & Bladder, Decreased caregiver support     Team Discharge Planning: Destination: PT-Home ,OT- Home , SLP-Home Projected Follow-up: PT-Home health PT (depending on transportation), OT-  Outpatient OT, SLP-Home Health SLP, Outpatient SLP Projected Equipment Needs: PT-To be determined, Rolling  walker with 5" wheels, OT- Tub/shower bench, SLP-None recommended by SLP Equipment Details: PT- , OT-  Patient/family involved in discharge planning: PT- Patient,  OT-Patient, SLP-Patient  MD ELOS: 21-23 days Medical Rehab Prognosis:  Excellent Assessment: The patient has been admitted for CIR therapies with the diagnosis of left MCA infarct. The team will be addressing functional mobility, strength, stamina, balance, safety, adaptive techniques and equipment, self-care, bowel and bladder mgt, patient and caregiver education, NMR, language, cognition, community reentry. Goals have been set at supervision with mobility and self-care and supervision to min assist with language and communication.   Due to the current state of emergency, patients may not be receiving their 3 hours per day of Medicare-mandated therapy.    Meredith Staggers, MD, FAAPMR     See Team Conference Notes for weekly updates to the plan of care

## 2020-12-10 NOTE — Evaluation (Signed)
Speech Language Pathology Assessment and Plan  Patient Details  Name: Candice Perez MRN: [id number] Date of Birth: [birth year]  SLP Diagnosis: Aphasia;Dysarthria;Dysphagia;Cognitive Impairments;Apraxia  Rehab Potential: Good ELOS: 3 weeks    Today's Date: 12/10/2020 SLP Individual Time: 1157-2620 SLP Individual Time Calculation (min): 55 min   Hospital Problem: Principal Problem:   Left middle cerebral artery stroke (HCC) Active Problems:   Right hemiplegia (HCC)  Past Medical History:  Past Medical History:  Diagnosis Date   Arthritis    DVT (deep venous thrombosis) (HCC)    Edema    GERD (gastroesophageal reflux disease)    OCCASIONAL - PRILOSEC IF NEEDED   Hyperlipidemia    Hypertension    no meds now   Hypothyroidism    Varicose veins    Past Surgical History:  Past Surgical History:  Procedure Laterality Date   CHOLECYSTECTOMY     Laparoscopic   CYSTOCELE REPAIR     HERNIA REPAIR     IR CT HEAD LTD  12/02/2020   IR PERCUTANEOUS ART THROMBECTOMY/INFUSION INTRACRANIAL INC DIAG ANGIO  12/02/2020   RADIOLOGY WITH ANESTHESIA N/A 12/02/2020   Procedure: IR WITH ANESTHESIA;  Surgeon: Luanne Bras, MD;  Location: [facility];  Service: Radiology;  Laterality: N/A;   TONSILLECTOMY     TOTAL KNEE ARTHROPLASTY Left 08/26/2013   Procedure: LEFT TOTAL KNEE REVISION;  Surgeon: Mauri Pole, MD;  Location: [facility];  Service: Orthopedics;  Laterality: Left;   VARICOSE VEIN SURGERY      Assessment / Plan / Recommendation  :  Candice Perez is a 70 year old right-handed female with history of hypertension, hyperlipidemia, prior TIA maintained on Plavix 05/2017, history of DVT, quit smoking approximately 10 years ago.  Per chart review patient lives with spouse.  1 level home with level entry.  Independent prior to admission and active.  Presented to [facility] 12/02/2020 with new onset atrial fibrillation with RVR and placed on Eliquis.  Patient with acute onset right  side weakness and aphasia in the ED code stroke was activated and CTA revealed LVO with occlusion of the superior division of the left middle cerebral artery.  She was transferred to [facility] for emergent mechanical thrombectomy per interventional radiology.  Follow-up MRI of the brain showed small to moderate acute cortical infarct in the left frontal parietal cortex primarily in the postcentral gyrus.  No associated hemorrhage.  Cardiology service follow-up for atrial fibrillation RVR with latest echocardiogram ejection fraction 40 to 35% grade 1 diastolic dysfunction.  The left ventricle demonstrating global hypokinesis.  Patient currently remains on Eliquis for CVA prophylaxis as well as atrial fibrillation.  Amiodarone ongoing for atrial fibrillation as well as Lanoxin with Cardizem.  Upper extremity Dopplers negative for DVT.  Hospital course mild AKI 1.32 and monitored.  Initially with nasogastric tube feeds for nutritional support her diet has been advanced to a dysphagia #2 thin liquid diet.  Therapy evaluations completed due to patient's right side weakness and aphasia was admitted for a comprehensive rehab program.  Patient transferred to CIR on 12/09/2020 .  Clinical Impression Patient presents with a moderate expressive language disorder, moderate dysarthria and motor speech disorder and mild oropharyngeal dysphagia. Comprehension >expression with patient able to follow one-step commands with 100% accuracy, answer yes/no basic level questions with 85% accuracy, but with increasing difficulty with two-step commands. She demonstrates adequate confrontational naming but divergent naming is impaired. Speech production consists of phonemic errors without adequate awareness, as well as motor speech errors  which significantly impact her ability to produce fricatives (/s, z, f, th/), /l/ and will benefit from speech articulation treatment as well as ongoing assessment of speech production. She was  able to approximate /s/ with SLP modeling. Patient did demonstrate awareness to when having word finding errors at sentence and conversational level, telling SLP, "I know it" and "hold on". Patient was oriented to place, season, year but not month. Further assessment of her higher level cognitive function may be beneficial. During assessment of her swallow funciton, she did not exhibit any overt s/s aspiration or penetration with thin liquids intake, but with regular solids (graham cracker), she exhibited prolonged mastication and trace to mild right side buccal cavity residuals. Patient will benefit from skilled SLP intervention to improve her speech, language, cognitive and swallow function goals prior to discharge.  Skilled Therapeutic Interventions          BSE, SLE  SLP Assessment  Patient will need skilled Speech Lanaguage Pathology Services during CIR admission    Recommendations  SLP Diet Recommendations: Thin;Dysphagia 2 (Fine chop) Liquid Administration via: Cup;Straw Medication Administration: Whole meds with puree Supervision: Intermittent supervision to cue for compensatory strategies;Patient able to self feed Compensations: Slow rate;Small sips/bites;Lingual sweep for clearance of pocketing;Monitor for anterior loss Postural Changes and/or Swallow Maneuvers: Seated upright 90 degrees Oral Care Recommendations: Oral care BID Recommendations for Other Services: Neuropsych consult Patient destination: Home Follow up Recommendations: Home Health SLP;Outpatient SLP Equipment Recommended: None recommended by SLP    SLP Frequency 3 to 5 out of 7 days   SLP Duration  SLP Intensity  SLP Treatment/Interventions 3 weeks  Minumum of 1-2 x/day, 30 to 90 minutes  Cognitive remediation/compensation;Dysphagia/aspiration precaution training;Speech/Language facilitation;Environmental controls;Patient/family education;Functional tasks    Pain Pain Assessment Pain Scale: 0-10 Pain Score:  0-No pain Faces Pain Scale: No hurt  Prior Functioning Cognitive/Linguistic Baseline: Within functional limits Type of Home: House  Lives With: Spouse Available Help at Discharge: Family;Available 24 hours/day Vocation: Retired  Programmer, systems Overall Cognitive Status: Impaired/Different from baseline Arousal/Alertness: Awake/alert Orientation Level: Oriented to person;Oriented to place;Oriented to situation Attention: Sustained;Selective Sustained Attention: Appears intact Selective Attention: Impaired Selective Attention Impairment: Verbal basic;Verbal complex Memory: Impaired Memory Impairment: Retrieval deficit Awareness: Impaired Awareness Impairment: Emergent impairment Problem Solving: Appears intact Safety/Judgment: Impaired  Comprehension Auditory Comprehension Overall Auditory Comprehension: Impaired Yes/No Questions: Impaired Basic Biographical Questions: 76-100% accurate Basic Immediate Environment Questions: 75-100% accurate Commands: Impaired One Step Basic Commands: 75-100% accurate Two Step Basic Commands: 50-74% accurate Multistep Basic Commands: Not tested Conversation: Complex Visual Recognition/Discrimination Discrimination: Within Function Limits Reading Comprehension Reading Status: Impaired Word level: Within functional limits Sentence Level: Impaired Paragraph Level: Not tested Functional Environmental (signs, name badge): Not tested Interfering Components: Processing time Expression Expression Primary Mode of Expression: Verbal Verbal Expression Overall Verbal Expression: Impaired Initiation: No impairment Automatic Speech: Name;Social Response;Counting;Day of week Level of Generative/Spontaneous Verbalization: Conversation;Sentence Repetition: No impairment Naming: Impairment Confrontation: Impaired Convergent: Not tested Divergent: 50-74% accurate Verbal Errors: Phonemic paraphasias;Not aware of errors Pragmatics: No  impairment Effective Techniques: Semantic cues;Open ended questions;Sentence completion Non-Verbal Means of Communication: Not applicable Written Expression Dominant Hand: Right Written Expression: Not tested Oral Motor Oral Motor/Sensory Function Overall Oral Motor/Sensory Function: Moderate impairment Facial ROM: Reduced right;Suspected CN VII (facial) dysfunction Facial Symmetry: Abnormal symmetry right;Suspected CN VII (facial) dysfunction Facial Strength: Reduced right;Suspected CN VII (facial) dysfunction Facial Sensation: Reduced right;Suspected CN V (Trigeminal) dysfunction Lingual ROM: Reduced right;Suspected CN XII (hypoglossal) dysfunction Lingual Symmetry: Within Functional  Limits Lingual Strength: Within Functional Limits Mandible: Within Functional Limits Motor Speech Overall Motor Speech: Impaired Respiration: Within functional limits Phonation: Normal Resonance: Within functional limits Articulation: Impaired Level of Impairment: Phrase Intelligibility: Intelligibility reduced Word: 75-100% accurate Phrase: 50-74% accurate Sentence: 25-49% accurate Conversation: 25-49% accurate Motor Planning: Impaired Level of Impairment: Systems developer Errors: Unaware Effective Techniques: Slow rate;Over-articulate  Care Tool Care Tool Cognition Expression of Ideas and Wants Expression of Ideas and Wants: Frequent difficulty - frequently exhibits difficulty with expressing needs and ideas   Understanding Verbal and Non-Verbal Content Understanding Verbal and Non-Verbal Content: Usually understands - understands most conversations, but misses some part/intent of message. Requires cues at times to understand   Memory/Recall Ability *first 3 days only Memory/Recall Ability *first 3 days only: Current season;That he or she is in a hospital/hospital unit      Intelligibility: Intelligibility reduced Word: 75-100% accurate Phrase: 50-74% accurate Sentence: 25-49%  accurate Conversation: 25-49% accurate  Bedside Swallowing Assessment General Date of Onset: 12/02/20 Previous Swallow Assessment: BSE 7/25 Diet Prior to this Study: Dysphagia 2 (chopped);Thin liquids Temperature Spikes Noted: No Respiratory Status: Room air History of Recent Intubation: Yes Length of Intubations (days):  (surgery only) Date extubated: 12/02/20 Behavior/Cognition: Alert;Cooperative;Pleasant mood Oral Cavity - Dentition: Dentures, bottom;Dentures, top Self-Feeding Abilities: Needs set up Vision: Functional for self-feeding Patient Positioning: Upright in chair/Tumbleform Baseline Vocal Quality: Normal Volitional Cough: Strong Volitional Swallow: Able to elicit  Oral Care Assessment   Ice Chips   Thin Liquid Thin Liquid: Within functional limits Presentation: Cup Nectar Thick   Honey Thick   Puree Puree: Not tested Solid Solid: Impaired Oral Phase Impairments: Impaired mastication Oral Phase Functional Implications: Oral residue;Prolonged oral transit;Right lateral sulci pocketing BSE Assessment Risk for Aspiration Impact on safety and function: Mild aspiration risk  Short Term Goals: Week 1: SLP Short Term Goal 1 (Week 1): Patient will tolerate trials of Dys 3 solids without significant oral residuals, with minA cues. SLP Short Term Goal 2 (Week 1): Patient will imitate to produce phonemes in initial position ofwords (/s, z, f, "th", l/) with modA cues. SLP Short Term Goal 3 (Week 1): Patient will perform divergent naming task and name at least 10 items corresponding to a given category, with minA. SLP Short Term Goal 4 (Week 1): Patient will demonstrate awareness to phonemic errors during speech production at structured level (word, short phrase) with modA cues. SLP Short Term Goal 5 (Week 1): Patient will participate in further assessment of higher level cognitive function and reading comprehension. SLP Short Term Goal 6 (Week 1): Patient will follow  two step verbal directions with 80% accuracy and minA cues.  Refer to Care Plan for Long Term Goals  Recommendations for other services: Neuropsych  Discharge Criteria: Patient will be discharged from SLP if patient refuses treatment 3 consecutive times without medical reason, if treatment goals not met, if there is a change in medical status, if patient makes no progress towards goals or if patient is discharged from hospital.  The above assessment, treatment plan, treatment alternatives and goals were discussed and mutually agreed upon: by patient  Sonia Baller, MA, CCC-SLP Speech Therapy

## 2020-12-11 DIAGNOSIS — I63512 Cerebral infarction due to unspecified occlusion or stenosis of left middle cerebral artery: Secondary | ICD-10-CM | POA: Diagnosis not present

## 2020-12-11 LAB — GLUCOSE, CAPILLARY
Glucose-Capillary: 153 mg/dL — ABNORMAL HIGH (ref 70–99)
Glucose-Capillary: 122 mg/dL — ABNORMAL HIGH (ref 70–99)
Glucose-Capillary: 112 mg/dL — ABNORMAL HIGH (ref 70–99)

## 2020-12-11 MED ORDER — METOPROLOL TARTRATE 25 MG PO TABS
37.5000 mg | ORAL_TABLET | Freq: Three times a day (TID) | ORAL | Status: DC
  Administered 2020-12-11 – 2020-12-14 (×9): 37.5 mg via ORAL
  Filled 2020-12-11 (×9): qty 1

## 2020-12-11 MED ORDER — DICLOFENAC SODIUM 1 % EX GEL
2.0000 g | Freq: Four times a day (QID) | CUTANEOUS | Status: DC
  Administered 2020-12-11 – 2020-12-30 (×72): 2 g via TOPICAL
  Filled 2020-12-11 (×2): qty 100

## 2020-12-11 NOTE — Progress Notes (Signed)
Occupational Therapy Session Note  Patient Details  Name: Candice Perez MRN: [id number] Date of Birth: 1950-05-31  Today's Date: 12/11/2020 OT Individual Time: [id number] OT Individual Time Calculation (min): 40 min    Short Term Goals: Week 1:  OT Short Term Goal 1 (Week 1): Pt will don shirt with min A. OT Short Term Goal 2 (Week 1): Pt will sit to stand with min to prep for toilet transfer. OT Short Term Goal 3 (Week 1): Pt will complete toileting with mod A. OT Short Term Goal 4 (Week 1): Pt will don pants with mod A. OT Short Term Goal 5 (Week 1): Pt will use RUE to wash LUE with guiding A.  Skilled Therapeutic Interventions/Progress Updates:    Pt semi upright in bed, with best friend at bedside. Pts friend spraying dry shampoo on her hair and looking for a brush.  OT found pts personal brush and encouraged pt in using RUE to complete grooming.  Pt required hand over hand only to facilitate succsessful gross grasp around hairbrush handle; pt able to use all other musculature of RUE independently to brush hair. Pt reporting that she had a rough night due to aching in right digits.  She reports she used to "crochet a lot". Digits of bilateral hands appearing with possible arthritic changes.  Educated pt on benefits of regular AROM of right digits and instructed pt in composite flexion and extension 3 x 15 reps.  Also provided pt with HEP of same exercise to be completed 4 times daily at regular intervals to reduce right hand stiffness and promote increased motor control of right dominant hand.  Noted right hand flexor tone Modified [place] 1+/4. Also notified MD that pt may benefit from right resting hand orthosis for night time to prevent joint contractures. Provided w/c with cushion at end of session due to previous one missing after pt transported to different room.  Call bell in reach, bed alarm on.  Therapy Documentation Precautions:  Precautions Precautions:  Fall Restrictions Weight Bearing Restrictions: No Other Position/Activity Restrictions: Right Hemiplegia   Therapy/Group: Individual Therapy  Ezekiel Slocumb 12/11/2020, 5:19 PM

## 2020-12-11 NOTE — Progress Notes (Signed)
Orthopedic Tech Progress Note Patient Details:  [name] January 31, 1951 [id number] Resting Hand Splint has been ordered from [place].  Patient ID: Candice Perez, female   DOB: 07/27/1950, 70 y.o.   MRN: [id number]  Jearld Lesch 12/11/2020, 4:59 PM

## 2020-12-11 NOTE — Progress Notes (Signed)
PROGRESS NOTE   Subjective/Complaints: "I'm good" Persists in communication until she is able to get her message across Has some pain in arthritic hand joints- agreeable to trying voltaren gel  ROS: +pain in arthritic hand joints. No sob, cp, n,v,d   Objective:   No results found. Recent Labs    12/09/20 0327 12/10/20 0446  WBC 8.0 9.6  HGB 11.3* 11.7*  HCT 36.6 36.7  PLT 327 356   Recent Labs    12/09/20 0327 12/10/20 0446  NA 137 138  K 4.8 4.5  CL 99 102  CO2 31 27  GLUCOSE 118* 126*  BUN 32* 35*  CREATININE 1.32* 1.19*  CALCIUM 9.8 9.5    Intake/Output Summary (Last 24 hours) at 12/11/2020 1222 Last data filed at 12/11/2020 1034 Gross per 24 hour  Intake 852.46 ml  Output --  Net 852.46 ml        Physical Exam: Vital Signs Blood pressure (!) 137/99, pulse 83, temperature 97.8 F (36.6 C), temperature source Oral, resp. rate 17, height '5\' 7"'$  (1.702 m), weight 126.1 kg, SpO2 93 %.  Gen: no distress, normal appearing HEENT: oral mucosa pink and moist, NCAT Cardio: Reg rate Chest: normal effort, normal rate of breathing Abd: soft, non-distended Extremities: No clubbing, cyanosis, or edema. Pulses are 2+ Psych: Pt's affect is appropriate. Pt is cooperative Skin: NSL Right wrist. Healing abrasion/wound right forearm, sl tender Neuro: right central 7, word finding deficits. Speech non-fluent. Pretty accurate with y/n answers.  Reflexes are 2+ in all 4's. Fine motor coordination is intact. No tremors. Motor function is grossly 5/5 LUE and LLE. RUE 3+ to 4- prox to 1-2/5 at wrist and HI. RLE: 3-4/5 prox to distal. Sensation sl decreased to LT/PP RUE and RLE Musculoskeletal: tenderness with ROM RUE.   Assessment/Plan: 1. Functional deficits which require 3+ hours per day of interdisciplinary therapy in a comprehensive inpatient rehab setting. Physiatrist is providing close team supervision and 24 hour  management of active medical problems listed below. Physiatrist and rehab team continue to assess barriers to discharge/monitor patient progress toward functional and medical goals  Care Tool:  Bathing    Body parts bathed by patient: Chest, Abdomen, Left upper leg, Face, Right arm, Right upper leg   Body parts bathed by helper: Left arm, Front perineal area, Buttocks, Left lower leg, Right lower leg     Bathing assist Assist Level: Moderate Assistance - Patient 50 - 74%     Upper Body Dressing/Undressing Upper body dressing   What is the patient wearing?: Bra, Pull over shirt    Upper body assist Assist Level: Maximal Assistance - Patient 25 - 49%    Lower Body Dressing/Undressing Lower body dressing      What is the patient wearing?: Incontinence brief, Pants     Lower body assist Assist for lower body dressing: Dependent - Patient 0%     Toileting Toileting    Toileting assist Assist for toileting: Dependent - Patient 0%     Transfers Chair/bed transfer  Transfers assist     Chair/bed transfer assist level: Minimal Assistance - Patient > 75%     Locomotion Ambulation   Ambulation  assist      Assist level: Minimal Assistance - Patient > 75% Assistive device: Walker-rolling Max distance: 30   Walk 10 feet activity   Assist     Assist level: Minimal Assistance - Patient > 75% Assistive device: Walker-rolling   Walk 50 feet activity   Assist Walk 50 feet with 2 turns activity did not occur: Safety/medical concerns         Walk 150 feet activity   Assist Walk 150 feet activity did not occur: Safety/medical concerns         Walk 10 feet on uneven surface  activity   Assist Walk 10 feet on uneven surfaces activity did not occur: Safety/medical concerns         Wheelchair     Assist Will patient use wheelchair at discharge?: No   Wheelchair activity did not occur: N/A         Wheelchair 50 feet with 2 turns  activity    Assist    Wheelchair 50 feet with 2 turns activity did not occur: N/A       Wheelchair 150 feet activity     Assist  Wheelchair 150 feet activity did not occur: N/A       Blood pressure (!) 137/99, pulse 83, temperature 97.8 F (36.6 C), temperature source Oral, resp. rate 17, height '5\' 7"'$  (1.702 m), weight 126.1 kg, SpO2 93 %.  Medical Problem List and Plan: 1.  Right side weakness and aphasia secondary to left MCA infarct due to left MCA occlusion status post thrombectomy with TIC12c etiology likely due to newly diagnosed atrial fibrillation             -patient may  shower             -ELOS/Goals: 14-16 days- supervision  -Continue CIR therapies today including PT, OT, and SLP  2.  Antithrombotics: -DVT/anticoagulation: Eliquis             -antiplatelet therapy: N/A 3. Pain Management: Tylenol as needed 4. Mood: Provide emotional support             -antipsychotic agents: N/A 5. Neuropsych: This patient is not ? capable of making decisions on her own behalf. 6. Skin/Wound Care: Local measures as needed RUE.  7. Fluids/Electrolytes/Nutrition: encourage appropriate PO  -I personally reviewed the patient's labs   8.  New onset atrial fibrillation.  Amiodarone 200 mg daily, Lanoxin 0.125 mg daily, Cardizem 240 mg daily,  -tachycardic. Increase lopressor to 37.5 TID 9.  Hypothyroidism.  Synthroid 10.  Dysphagia.  Dysphagia #2 thin liquids.  Follow-up speech therapy- chewing inside mouth- monitor closely for pocketing? 11.  Hyperlipidemia.  Crestor 12.  Morbid obesity.  BMI 45.75.  Dietary follow-up 13.  AKI/prerenal.  BUN/Cr slightly more elevated today -encourage PO fluids  -will run IVF at HS until she catches up a bit  -check labs again Monday 14. HTN: increase lopressor to 37.'5mg'$  TID 15. Prediabetes HgbA1c 6.2- d/c ensure       LOS: 2 days A FACE TO FACE EVALUATION WAS PERFORMED  Martha Clan P Matheu Ploeger 12/11/2020, 12:22 PM

## 2020-12-12 DIAGNOSIS — R69 Illness, unspecified: Secondary | ICD-10-CM | POA: Diagnosis not present

## 2020-12-12 DIAGNOSIS — I10 Essential (primary) hypertension: Secondary | ICD-10-CM | POA: Diagnosis not present

## 2020-12-12 DIAGNOSIS — E039 Hypothyroidism, unspecified: Secondary | ICD-10-CM | POA: Diagnosis not present

## 2020-12-12 DIAGNOSIS — I63512 Cerebral infarction due to unspecified occlusion or stenosis of left middle cerebral artery: Secondary | ICD-10-CM | POA: Diagnosis not present

## 2020-12-12 LAB — GLUCOSE, CAPILLARY
Glucose-Capillary: 117 mg/dL — ABNORMAL HIGH (ref 70–99)
Glucose-Capillary: 106 mg/dL — ABNORMAL HIGH (ref 70–99)

## 2020-12-12 NOTE — Progress Notes (Signed)
Speech Language Pathology Daily Session Note  Patient Details  Name: Candice Perez MRN: [id number] Date of Birth: 1950/07/20  Today's Date: 12/12/2020 SLP Individual Time: [id number] SLP Individual Time Calculation (min): 40 min  Short Term Goals: Week 1: SLP Short Term Goal 1 (Week 1): Patient will tolerate trials of Dys 3 solids without significant oral residuals, with minA cues. SLP Short Term Goal 2 (Week 1): Patient will imitate to produce phonemes in initial position ofwords (/s, z, f, "th", l/) with modA cues. SLP Short Term Goal 3 (Week 1): Patient will perform divergent naming task and name at least 10 items corresponding to a given category, with minA. SLP Short Term Goal 4 (Week 1): Patient will demonstrate awareness to phonemic errors during speech production at structured level (word, short phrase) with modA cues. SLP Short Term Goal 5 (Week 1): Patient will participate in further assessment of higher level cognitive function and reading comprehension. SLP Short Term Goal 6 (Week 1): Patient will follow two step verbal directions with 80% accuracy and minA cues.  Skilled Therapeutic Interventions:  Pt was seen for skilled ST targeting goals for communication.  Pt was awake, alert, and agreeable to participating in treatment.  SLP facilitated the session with sentence completion tasks to address naming and error awareness goals.  Pt was able to name objects in pictures with min assist verbal cues.  She could at times self correct phonemic errors but other times she needed up to mod assist multimodal cues to correct.  SLP increased challenge with a sentence production task to further address pt's ability to recognize and correct errors which pt was able to complete with min assist.  Pt was left in bed with bed alarm set and call bell within reach.  Continue per current plan of care.    Pain Pain Assessment Pain Score: 0-No pain  Therapy/Group: Individual Therapy  Sulema Braid, Selinda Orion 12/12/2020, 1:39 PM

## 2020-12-12 NOTE — Progress Notes (Signed)
Occupational Therapy Session Note  Patient Details  Name: Candice Perez MRN: [id number] Date of Birth: [birth year]  Today's Date: 12/12/2020 OT Individual Time: 0900-1000 and 1500-1530 OT Individual Time Calculation (min): 60 min and 30 min    Short Term Goals: Week 1:  OT Short Term Goal 1 (Week 1): Pt will don shirt with min A. OT Short Term Goal 2 (Week 1): Pt will sit to stand with min to prep for toilet transfer. OT Short Term Goal 3 (Week 1): Pt will complete toileting with mod A. OT Short Term Goal 4 (Week 1): Pt will don pants with mod A. OT Short Term Goal 5 (Week 1): Pt will use RUE to wash LUE with guiding A.  Skilled Therapeutic Interventions/Progress Updates:    Visit 1: Pain: no c/o pain  Pt received in bed, sat to EOB with mod A.  Focused this session on transfers with RW to wc and to toilet and to recliner.  Mod cues to push up from bed, and to focus on placement of RLE due to limited sensation with stepping around for transfers. Due to body habitus, pt does need significant help with cleansing and clothing management. She c/o pain in R hand and needed cues to try to use R hand as much as possible. Unable to void on toilet.  At sink, pt completed oral care with set up.   Pt sat in recliner at end of session with alarm on and all needs met.    Visit 2: Pain: no c/o pain  Pt received in bed, she had just been cleaned up by nurse tech. Pt seen this session to focus on RUE AROM, strength and functional use. She has a good amount of AROM, able to lift arm towards ceiling with sitting up in bed but very impaired sensation. Pt states she can not feel her arm at all. Worked with towel slides, dowel exercises, foam block, and functional task of holding hair brush and brushing hair with hand over hand guidance to help facilitate sensory awareness of arm. Pt participated well and put in good effort.  Pt resting in bed with all needs met and alarm set.   Therapy  Documentation Precautions:  Precautions Precautions: Fall Restrictions Weight Bearing Restrictions: No Other Position/Activity Restrictions: Right Hemiplegia    Vital Signs: Therapy Vitals Temp: 98.1 F (36.7 C) Pulse Rate: 74 Resp: 14 BP: (!) 127/98 Patient Position (if appropriate): Lying Oxygen Therapy SpO2: 93 % O2 Device: Room Air   ADL: ADL Eating: Supervision/safety Grooming: Minimal assistance Upper Body Bathing: Moderate assistance Where Assessed-Upper Body Bathing: Wheelchair Lower Body Bathing: Maximal assistance Where Assessed-Lower Body Bathing: Wheelchair Upper Body Dressing: Maximal assistance Where Assessed-Upper Body Dressing: Wheelchair Lower Body Dressing: Dependent Where Assessed-Lower Body Dressing: Wheelchair Toileting: Dependent Where Assessed-Toileting: Bedside Commode Toilet Transfer: Maximal assistance Toilet Transfer Method: Stand pivot      Therapy/Group: Individual Therapy  [place] 12/12/2020, 7:58 AM

## 2020-12-12 NOTE — Progress Notes (Signed)
Physical Therapy Session Note  Patient Details  Name: Candice Perez MRN: [id number] Date of Birth: 04-21-1951  Today's Date: 12/12/2020 PT Individual Time: 1030-1128 PT Individual Time Calculation (min): 58 min   Short Term Goals: Week 1:  PT Short Term Goal 1 (Week 1): Pt will complete bed mobility MinA PT Short Term Goal 2 (Week 1): Pt will ambulate 17f CGA PT Short Term Goal 3 (Week 1): Pt will initiate stair training PT Short Term Goal 4 (Week 1): Pt will transition STS CGA  Skilled Therapeutic Interventions/Progress Updates:    Pt presents in recliner. Requires mod assist for initial sit > stand and stand pivot transfer to w/c without AD and cues for technique. Took to gym downstairs for therapy session. Trial of gait with RW x 15' with min assist, min assist for sit > stand this time but mod verbal cues needed for hand placement and facilitation for anterior weightshift. Noted to have difficulty with grip on R with RW so added hand splint (only large size available but it appears to work ok for now). Pt reporting incontinent episode (thought to be BM but turned out to be urine only). Returned back to room total assist for transport. Min assist for gait into bathroom with RW with cues for safe set-up and to fully line up with toilet before attempting to sit. Pt requires assistance with clothing management due to body habitus but made great attempts with LUE. Total assist for hygiene. Min assist for gait back out of bathroom with RW with cues for safe walker placement during turning and transitions. Multiple sit > stands to fix alignment of clothing and brief. NMR for blocked practice sit <> stand retraining and focus on sequence with hand orthosis (put hand into it once in standing and remove before sitting down) with focus on dynamic standing balance to perform transition, as well as forward and retro gait x 5 feet each trial progressing to min /CGA overall with cues for sequence. Short  distance gait back to bed with min assist and cues for sequence with orthosis and RW and returned to supine with CGA. Pt repositioned independently. PT propped RUE on pillow for support. Education on importance of awareness of RUE (and RLE) due to impaired sensation. Pt verbalized understanding.   Therapy Documentation Precautions:  Precautions Precautions: Fall Restrictions Weight Bearing Restrictions: No Other Position/Activity Restrictions: Right Hemiplegia   Pain: Denies pain currently. Reports her RUE was bothering her earlier but had medication.    Therapy/Group: Individual Therapy  GCanary BrimBIvory Broad PT, DPT, CBIS  12/12/2020, 12:17 PM

## 2020-12-12 NOTE — Progress Notes (Signed)
PROGRESS NOTE   Subjective/Complaints: Voltaren gel helped with hand OA Discussed wrist splint at night and she is agreeable She has no other complaints  ROS: +pain in arthritic hand joints. Denies sob, cp, n,v,d   Objective:   No results found. Recent Labs    12/10/20 0446  WBC 9.6  HGB 11.7*  HCT 36.7  PLT 356   Recent Labs    12/10/20 0446  NA 138  K 4.5  CL 102  CO2 27  GLUCOSE 126*  BUN 35*  CREATININE 1.19*  CALCIUM 9.5    Intake/Output Summary (Last 24 hours) at 12/12/2020 1737 Last data filed at 12/12/2020 1305 Gross per 24 hour  Intake 754 ml  Output --  Net 754 ml        Physical Exam: Vital Signs Blood pressure 113/78, pulse 67, temperature 97.6 F (36.4 C), temperature source Oral, resp. rate 18, height '5\' 7"'$  (1.702 m), weight 128.8 kg, SpO2 95 %.  Gen: no distress, normal appearing HEENT: oral mucosa pink and moist, NCAT Cardio: Reg rate Chest: normal effort, normal rate of breathing Abd: soft, non-distended Ext: no edema Psych: pleasant, normal affect Skin: intact Extremities: No clubbing, cyanosis, or edema. Pulses are 2+ Psych: Pt's affect is appropriate. Pt is cooperative Skin: NSL Right wrist. Healing abrasion/wound right forearm, sl tender Neuro: right central 7, word finding deficits. Speech non-fluent. Pretty accurate with y/n answers.  Reflexes are 2+ in all 4's. Fine motor coordination is intact. No tremors. Motor function is grossly 5/5 LUE and LLE. RUE 3+ to 4- prox to 1-2/5 at wrist and HI. RLE: 3-4/5 prox to distal. Sensation sl decreased to LT/PP RUE and RLE Musculoskeletal: tenderness with ROM RUE.   Assessment/Plan: 1. Functional deficits which require 3+ hours per day of interdisciplinary therapy in a comprehensive inpatient rehab setting. Physiatrist is providing close team supervision and 24 hour management of active medical problems listed below. Physiatrist and  rehab team continue to assess barriers to discharge/monitor patient progress toward functional and medical goals  Care Tool:  Bathing    Body parts bathed by patient: Chest, Abdomen, Left upper leg, Face, Right arm, Right upper leg   Body parts bathed by helper: Left arm, Front perineal area, Buttocks, Left lower leg, Right lower leg     Bathing assist Assist Level: Moderate Assistance - Patient 50 - 74%     Upper Body Dressing/Undressing Upper body dressing   What is the patient wearing?: Bra, Pull over shirt    Upper body assist Assist Level: Maximal Assistance - Patient 25 - 49%    Lower Body Dressing/Undressing Lower body dressing      What is the patient wearing?: Incontinence brief, Pants     Lower body assist Assist for lower body dressing: Maximal Assistance - Patient 25 - 49%     Toileting Toileting    Toileting assist Assist for toileting: Dependent - Patient 0%     Transfers Chair/bed transfer  Transfers assist     Chair/bed transfer assist level: Minimal Assistance - Patient > 75%     Locomotion Ambulation   Ambulation assist      Assist level: Minimal Assistance -  Patient > 75% Assistive device: Walker-rolling (hand orthosis) Max distance: 15'   Walk 10 feet activity   Assist     Assist level: Minimal Assistance - Patient > 75% Assistive device: Walker-rolling, Orthosis   Walk 50 feet activity   Assist Walk 50 feet with 2 turns activity did not occur: Safety/medical concerns         Walk 150 feet activity   Assist Walk 150 feet activity did not occur: Safety/medical concerns         Walk 10 feet on uneven surface  activity   Assist Walk 10 feet on uneven surfaces activity did not occur: Safety/medical concerns         Wheelchair     Assist Will patient use wheelchair at discharge?: No   Wheelchair activity did not occur: N/A         Wheelchair 50 feet with 2 turns activity    Assist     Wheelchair 50 feet with 2 turns activity did not occur: N/A       Wheelchair 150 feet activity     Assist  Wheelchair 150 feet activity did not occur: N/A       Blood pressure 113/78, pulse 67, temperature 97.6 F (36.4 C), temperature source Oral, resp. rate 18, height '5\' 7"'$  (1.702 m), weight 128.8 kg, SpO2 95 %.  Medical Problem List and Plan: 1.  Right side weakness and aphasia secondary to left MCA infarct due to left MCA occlusion status post thrombectomy with TIC12c etiology likely due to newly diagnosed atrial fibrillation             -patient may  shower             -ELOS/Goals: 14-16 days- supervision  -Continue CIR therapies today including PT, OT, and SLP  2.  Antithrombotics: -DVT/anticoagulation: Eliquis             -antiplatelet therapy: N/A 3. Pain Management: Tylenol as needed 4. Mood: Provide emotional support             -antipsychotic agents: N/A 5. Neuropsych: This patient is not ? capable of making decisions on her own behalf. 6. Skin/Wound Care: Local measures as needed RUE.  7. Fluids/Electrolytes/Nutrition: encourage appropriate PO  -I personally reviewed the patient's labs   8.  New onset atrial fibrillation.  Amiodarone 200 mg daily, Lanoxin 0.125 mg daily, Cardizem 240 mg daily,  -tachycardic. Increase lopressor to 37.5 TID 9.  Hypothyroidism.  Synthroid 10.  Dysphagia.  Dysphagia #2 thin liquids.  Follow-up speech therapy- chewing inside mouth- monitor closely for pocketing? 11.  Hyperlipidemia.  Crestor 12.  Morbid obesity.  BMI 45.75.  Dietary follow-up 13.  AKI/prerenal.  BUN/Cr slightly more elevated today -encourage PO fluids  -will run IVF at HS until she catches up a bit  -check labs again Monday 14. HTN: better controlled, continue lopressor to 37.'5mg'$  TID 15. Prediabetes HgbA1c 6.2- d/c ensure 16. Hand joint OA: voltaren gel ordered 17. Wrist drop: resting hand splint ordered.        LOS: 3 days A FACE TO FACE EVALUATION WAS  PERFORMED  Izora Ribas 12/12/2020, 5:37 PM

## 2020-12-13 DIAGNOSIS — I4891 Unspecified atrial fibrillation: Secondary | ICD-10-CM | POA: Diagnosis not present

## 2020-12-13 DIAGNOSIS — I63512 Cerebral infarction due to unspecified occlusion or stenosis of left middle cerebral artery: Secondary | ICD-10-CM | POA: Diagnosis not present

## 2020-12-13 DIAGNOSIS — I69391 Dysphagia following cerebral infarction: Secondary | ICD-10-CM | POA: Diagnosis not present

## 2020-12-13 DIAGNOSIS — R7989 Other specified abnormal findings of blood chemistry: Secondary | ICD-10-CM | POA: Diagnosis not present

## 2020-12-13 LAB — CBC
HCT: 37.9 % (ref 36.0–46.0)
Hemoglobin: 12.4 g/dL (ref 12.0–15.0)
MCH: 29.7 pg (ref 26.0–34.0)
MCHC: 32.7 g/dL (ref 30.0–36.0)
MCV: 90.7 fL (ref 80.0–100.0)
Platelets: 398 10*3/uL (ref 150–400)
RBC: 4.18 MIL/uL (ref 3.87–5.11)
RDW: 13.2 % (ref 11.5–15.5)
WBC: 9.2 10*3/uL (ref 4.0–10.5)
nRBC: 0 % (ref 0.0–0.2)

## 2020-12-13 LAB — DIGOXIN LEVEL: Digoxin Level: 0.7 ng/mL — ABNORMAL LOW (ref 0.8–2.0)

## 2020-12-13 LAB — BASIC METABOLIC PANEL
Anion gap: 8 (ref 5–15)
BUN: 26 mg/dL — ABNORMAL HIGH (ref 8–23)
CO2: 26 mmol/L (ref 22–32)
Calcium: 9.3 mg/dL (ref 8.9–10.3)
Chloride: 100 mmol/L (ref 98–111)
Creatinine, Ser: 1.16 mg/dL — ABNORMAL HIGH (ref 0.44–1.00)
GFR, Estimated: 50 mL/min — ABNORMAL LOW (ref >60)
Glucose, Bld: 113 mg/dL — ABNORMAL HIGH (ref 70–99)
Potassium: 4.3 mmol/L (ref 3.5–5.1)
Sodium: 134 mmol/L — ABNORMAL LOW (ref 135–145)

## 2020-12-13 NOTE — Progress Notes (Signed)
Physical Therapy Session Note  Patient Details  Name: Candice Perez MRN: [id number] Date of Birth: [birth year]  Pt pleasantly refusing due to fatigue from poor nights rest and busy day of therapies. Agreeable to participate tomorrow. Missed 45 minutes of skilled therapy due to fatigue and refusal.  Therapy Documentation Precautions:  Precautions Precautions: Fall Restrictions Weight Bearing Restrictions: No Other Position/Activity Restrictions: Right Hemiplegia General: PT Amount of Missed Time (min): 45 Minutes PT Missed Treatment Reason: Patient fatigue;Patient unwilling to participate   Alger Simons 12/13/2020, 3:23 PM

## 2020-12-13 NOTE — Progress Notes (Signed)
PROGRESS NOTE   Subjective/Complaints: C/o pain in her right wrist. Says that voltaren is helping. Otherwise doing alright  ROS: limited due to language/communication    Objective:   No results found. Recent Labs    12/13/20 0627  WBC 9.2  HGB 12.4  HCT 37.9  PLT 398   Recent Labs    12/13/20 0627  NA 134*  K 4.3  CL 100  CO2 26  GLUCOSE 113*  BUN 26*  CREATININE 1.16*  CALCIUM 9.3    Intake/Output Summary (Last 24 hours) at 12/13/2020 1301 Last data filed at 12/13/2020 0500 Gross per 24 hour  Intake 419 ml  Output 600 ml  Net -181 ml        Physical Exam: Vital Signs Blood pressure 116/63, pulse 98, temperature (!) 97.4 F (36.3 C), temperature source Oral, resp. rate 20, height '5\' 7"'$  (1.702 m), weight 130.2 kg, SpO2 94 %.  Constitutional: No distress . Vital signs reviewed. HEENT: EOMI, oral membranes moist Neck: supple Cardiovascular: RRR without murmur. No JVD    Respiratory/Chest: CTA Bilaterally without wheezes or rales. Normal effort    GI/Abdomen: BS +, non-tender, non-distended Ext: no clubbing, cyanosis, or edema Psych: pleasant and cooperative  Skin: intact Extremities: No clubbing, cyanosis, or edema. Pulses are 2+ Psych: Pt's affect is appropriate. Pt is cooperative Skin: NSL Right wrist. Healing abrasion/wound right forearm, sl tender Neuro: right central 7, ongoing word finding deficits. Speech non-fluent but able to speak in phrases and short sentences with extra time.  Reflexes are 2+ in all 4's. Fine motor coordination is intact. No tremors. Motor function is grossly 5/5 LUE and LLE. RUE 3+ to 4- prox to 1-2/5 at wrist and HI. RLE: 3-4/5 prox to distal. Sensation remains sl decreased to LT/PP RUE and RLE Musculoskeletal: tenderness with ROM of right wrist. No edema   Assessment/Plan: 1. Functional deficits which require 3+ hours per day of interdisciplinary therapy in a  comprehensive inpatient rehab setting. Physiatrist is providing close team supervision and 24 hour management of active medical problems listed below. Physiatrist and rehab team continue to assess barriers to discharge/monitor patient progress toward functional and medical goals  Care Tool:  Bathing    Body parts bathed by patient: Right arm, Left arm, Chest, Abdomen, Front perineal area, Right upper leg, Left upper leg, Face   Body parts bathed by helper: Left lower leg, Right lower leg Body parts n/a: Buttocks   Bathing assist Assist Level: Minimal Assistance - Patient > 75%     Upper Body Dressing/Undressing Upper body dressing   What is the patient wearing?: Pull over shirt    Upper body assist Assist Level: Supervision/Verbal cueing    Lower Body Dressing/Undressing Lower body dressing      What is the patient wearing?: Incontinence brief, Pants     Lower body assist Assist for lower body dressing: Maximal Assistance - Patient 25 - 49%     Toileting Toileting    Toileting assist Assist for toileting: Dependent - Patient 0%     Transfers Chair/bed transfer  Transfers assist     Chair/bed transfer assist level: Minimal Assistance - Patient > 75%  Locomotion Ambulation   Ambulation assist      Assist level: Minimal Assistance - Patient > 75% Assistive device: Walker-rolling (hand orthosis) Max distance: 15'   Walk 10 feet activity   Assist     Assist level: Minimal Assistance - Patient > 75% Assistive device: Walker-rolling, Orthosis   Walk 50 feet activity   Assist Walk 50 feet with 2 turns activity did not occur: Safety/medical concerns         Walk 150 feet activity   Assist Walk 150 feet activity did not occur: Safety/medical concerns         Walk 10 feet on uneven surface  activity   Assist Walk 10 feet on uneven surfaces activity did not occur: Safety/medical concerns         Wheelchair     Assist Will  patient use wheelchair at discharge?: No   Wheelchair activity did not occur: N/A         Wheelchair 50 feet with 2 turns activity    Assist    Wheelchair 50 feet with 2 turns activity did not occur: N/A       Wheelchair 150 feet activity     Assist  Wheelchair 150 feet activity did not occur: N/A       Blood pressure 116/63, pulse 98, temperature (!) 97.4 F (36.3 C), temperature source Oral, resp. rate 20, height '5\' 7"'$  (1.702 m), weight 130.2 kg, SpO2 94 %.  Medical Problem List and Plan: 1.  Right side weakness and aphasia secondary to left MCA infarct due to left MCA occlusion status post thrombectomy with TIC12c etiology likely due to newly diagnosed atrial fibrillation             -patient may  shower             -ELOS/Goals: 14-16 days- supervision  --Continue CIR therapies including PT, OT, and SLP   2.  Antithrombotics: -DVT/anticoagulation: Eliquis             -antiplatelet therapy: N/A 3. Pain Management: Tylenol as needed 4. Mood: Provide emotional support             -antipsychotic agents: N/A 5. Neuropsych: This patient is not ? capable of making decisions on her own behalf. 6. Skin/Wound Care: Local measures as needed RUE.  7. Fluids/Electrolytes/Nutrition: encourage appropriate PO  I personally reviewed the patient's labs today.   8.  New onset atrial fibrillation.  Amiodarone 200 mg daily, Lanoxin 0.125 mg daily, Cardizem 240 mg daily,  -tachycardic. Increase lopressor to 37.5 TID 9.  Hypothyroidism.  Synthroid 10.  Dysphagia.  Dysphagia #2 thin liquids.  adv per SLP, watch for pocketing 11.  Hyperlipidemia.  Crestor 12.  Morbid obesity.  BMI 45.75.  Dietary follow-up 13.  AKI/prerenal.  BUN/Cr slightly more elevated today -encourage PO fluids  -labs look better today 8/1---dc IVF at HS 14. HTN: better controlled, continue lopressor to 37.'5mg'$  TID 15. Prediabetes HgbA1c 6.2- d/c ensure 16. Hand joint OA: voltaren gel ordered with  improvement 17. Wrist drop: resting hand splint ordered.        LOS: 4 days A FACE TO FACE EVALUATION WAS PERFORMED  Meredith Staggers 12/13/2020, 1:01 PM

## 2020-12-13 NOTE — Progress Notes (Signed)
Speech Language Pathology Daily Session Note  Patient Details  Name: Candice Perez MRN: [id number] Date of Birth: 1951-05-08  Today's Date: 12/13/2020 SLP Individual Time: 1020-1115 SLP Individual Time Calculation (min): 55 min  Short Term Goals: Week 1: SLP Short Term Goal 1 (Week 1): Patient will tolerate trials of Dys 3 solids without significant oral residuals, with minA cues. SLP Short Term Goal 2 (Week 1): Patient will imitate to produce phonemes in initial position ofwords (/s, z, f, "th", l/) with modA cues. SLP Short Term Goal 3 (Week 1): Patient will perform divergent naming task and name at least 10 items corresponding to a given category, with minA. SLP Short Term Goal 4 (Week 1): Patient will demonstrate awareness to phonemic errors during speech production at structured level (word, short phrase) with modA cues. SLP Short Term Goal 5 (Week 1): Patient will participate in further assessment of higher level cognitive function and reading comprehension. SLP Short Term Goal 6 (Week 1): Patient will follow two step verbal directions with 80% accuracy and minA cues.  Skilled Therapeutic Interventions: Patient agreeable to skilled ST intervention with focus on speech and language goals. SLP facilitated motor speech production and articulatory placement of the following phonemes in the initial position in single syllable words: /s, f, th/. SLP utilized clear mask and placed mirror in front of patient to promote visual reinforcement. Patient produced /s/ with ~60% accuracy with max A verbal/visual cues and repetition, /f/ with ~25% accuracy with max verbal/visual/tactile cues, and /th/ with ~75% accuracy with mod A verbal/visual/tactile cues for adequate production. Patient demonstrated good carry over of skill with use of mirror which helped her self monitor articulatory placement. She exhibited some awareness of phonemic errors throughout treatment although still limited in her ability to  repair breakdowns. Patient was left in bed with alarm activated and immediate needs within reach at end of session. Continue per current plan of care.      Pain Pain Assessment Pain Scale: 0-10 Pain Score: 0-No pain  Therapy/Group: Individual Therapy  Patty Sermons 12/13/2020, 12:38 PM

## 2020-12-13 NOTE — Progress Notes (Signed)
Occupational Therapy Session Note  Patient Details  Name: Candice Perez MRN: [id number] Date of Birth: 1950/08/18  Today's Date: 12/13/2020 OT Individual Time: 0902-1000 OT Individual Time Calculation (min): 58 min    Short Term Goals: Week 1:  OT Short Term Goal 1 (Week 1): Pt will don shirt with min A. OT Short Term Goal 2 (Week 1): Pt will sit to stand with min to prep for toilet transfer. OT Short Term Goal 3 (Week 1): Pt will complete toileting with mod A. OT Short Term Goal 4 (Week 1): Pt will don pants with mod A. OT Short Term Goal 5 (Week 1): Pt will use RUE to wash LUE with guiding A.  Skilled Therapeutic Interventions/Progress Updates:    Pt supine in bed, periwick on, she reports she didn't sleep well last night due to periwick and right hand achiness.  OT assisted pt in doffing periwick. Pt also reports she cant remember the last time she had a bowel movement.  Nurse notified.  Pt requesting to shower and use the toilet.  Supine to sit with CGA.  Stand pivot using RW to w/c with CGA.  Pt transported to bathroom and used grab bar then hand held assist LUE during stand pivot to 3 in 1 commode with min assist.  Pt attempted but unable to void of bowel despite trying. Pt ambulated a few steps using hand held assist and CGA then step over shower threshold with use of grab bar, step by step Vcs and CGA.  Pt washed UB/LB in seated position with min assist (not including buttocks or hair washing).  Discontinued shower due to pt feeling light headed and "tired". After drying off, pt completed stand pivot to w/c using grab bars.  Transported back to room and stand pivot to EOB.  Assessed vitals and pulse, O2, BP were all WNL. Pt donned shirt with setup.  Pants and brief donned with max assist.  Sit to supine with CGA.  Call bell in reach, bed alarm on.  Therapy Documentation Precautions:  Precautions Precautions: Fall Restrictions Weight Bearing Restrictions: No Other Position/Activity  Restrictions: Right Hemiplegia    Therapy/Group: Individual Therapy  Ezekiel Slocumb 12/13/2020, 12:38 PM

## 2020-12-14 MED ORDER — METOPROLOL TARTRATE 50 MG PO TABS
50.0000 mg | ORAL_TABLET | Freq: Three times a day (TID) | ORAL | Status: DC
  Administered 2020-12-14 – 2020-12-20 (×18): 50 mg via ORAL
  Filled 2020-12-14 (×18): qty 1

## 2020-12-14 NOTE — Progress Notes (Signed)
PROGRESS NOTE   Subjective/Complaints: C/o numbness in both feet- says was present prior to admission as well. Reviewed HgbA1c and it is 6.3- discussed with her that this is considered prediabetes. Discussed Qutenza and she would like to try this outpatient.   ROS: +numbness both feet.    Objective:   No results found. Recent Labs    12/13/20 0627  WBC 9.2  HGB 12.4  HCT 37.9  PLT 398   Recent Labs    12/13/20 0627  NA 134*  K 4.3  CL 100  CO2 26  GLUCOSE 113*  BUN 26*  CREATININE 1.16*  CALCIUM 9.3    Intake/Output Summary (Last 24 hours) at 12/14/2020 1209 Last data filed at 12/14/2020 0700 Gross per 24 hour  Intake 358 ml  Output --  Net 358 ml        Physical Exam: Vital Signs Blood pressure (!) 125/93, pulse 98, temperature 97.9 F (36.6 C), temperature source Oral, resp. rate 18, height '5\' 7"'$  (1.702 m), weight 130.2 kg, SpO2 97 %. Gen: no distress, normal appearing HEENT: oral mucosa pink and moist, NCAT Cardio: Reg rate Chest: normal effort, normal rate of breathing Abd: soft, non-distended Ext: no edema Psych: Pt's affect is appropriate. Pt is cooperative Skin: NSL Right wrist. Healing abrasion/wound right forearm, sl tender Neuro: right central 7, ongoing word finding deficits. Speech non-fluent but able to speak in phrases and short sentences with extra time.  Reflexes are 2+ in all 4's. Fine motor coordination is intact. No tremors. Motor function is grossly 5/5 LUE and LLE. RUE 3+ to 4- prox to 1-2/5 at wrist and HI. RLE: 3-4/5 prox to distal. Sensation remains sl decreased to LT/PP RUE and RLE Musculoskeletal: tenderness with ROM of right wrist. No edema   Assessment/Plan: 1. Functional deficits which require 3+ hours per day of interdisciplinary therapy in a comprehensive inpatient rehab setting. Physiatrist is providing close team supervision and 24 hour management of active medical  problems listed below. Physiatrist and rehab team continue to assess barriers to discharge/monitor patient progress toward functional and medical goals  Care Tool:  Bathing    Body parts bathed by patient: Right arm, Left arm, Chest, Abdomen, Front perineal area, Right upper leg, Left upper leg, Face   Body parts bathed by helper: Buttocks, Right lower leg, Left lower leg Body parts n/a: Buttocks   Bathing assist Assist Level: Minimal Assistance - Patient > 75%     Upper Body Dressing/Undressing Upper body dressing   What is the patient wearing?: Pull over shirt, Bra    Upper body assist Assist Level: Moderate Assistance - Patient 50 - 74% (S with shirt, mod with bra)    Lower Body Dressing/Undressing Lower body dressing      What is the patient wearing?: Incontinence brief, Pants     Lower body assist Assist for lower body dressing: Maximal Assistance - Patient 25 - 49%     Toileting Toileting    Toileting assist Assist for toileting: Maximal Assistance - Patient 25 - 49%     Transfers Chair/bed transfer  Transfers assist     Chair/bed transfer assist level: Minimal Assistance - Patient >  75%     Locomotion Ambulation   Ambulation assist      Assist level: Minimal Assistance - Patient > 75% Assistive device: Walker-rolling (hand orthosis) Max distance: 15'   Walk 10 feet activity   Assist     Assist level: Minimal Assistance - Patient > 75% Assistive device: Walker-rolling, Orthosis   Walk 50 feet activity   Assist Walk 50 feet with 2 turns activity did not occur: Safety/medical concerns         Walk 150 feet activity   Assist Walk 150 feet activity did not occur: Safety/medical concerns         Walk 10 feet on uneven surface  activity   Assist Walk 10 feet on uneven surfaces activity did not occur: Safety/medical concerns         Wheelchair     Assist Will patient use wheelchair at discharge?: No   Wheelchair  activity did not occur: N/A         Wheelchair 50 feet with 2 turns activity    Assist    Wheelchair 50 feet with 2 turns activity did not occur: N/A       Wheelchair 150 feet activity     Assist  Wheelchair 150 feet activity did not occur: N/A       Blood pressure (!) 125/93, pulse 98, temperature 97.9 F (36.6 C), temperature source Oral, resp. rate 18, height '5\' 7"'$  (1.702 m), weight 130.2 kg, SpO2 97 %.  Medical Problem List and Plan: 1.  Right side weakness and aphasia secondary to left MCA infarct due to left MCA occlusion status post thrombectomy with TIC12c etiology likely due to newly diagnosed atrial fibrillation             -patient may  shower             -ELOS/Goals: 14-16 days- supervision  --Continue CIR therapies including PT, OT, and SLP    -Interdisciplinary Team Conference today   2.  Antithrombotics: -DVT/anticoagulation: Eliquis             -antiplatelet therapy: N/A 3. Pain Management: Tylenol as needed 4. Mood: Provide emotional support             -antipsychotic agents: N/A 5. Neuropsych: This patient is not ? capable of making decisions on her own behalf. 6. Skin/Wound Care: Local measures as needed RUE.  7. Fluids/Electrolytes/Nutrition: encourage appropriate PO  I personally reviewed the patient's labs today.   8.  New onset atrial fibrillation.  Amiodarone 200 mg daily, Lanoxin 0.125 mg daily, Cardizem 240 mg daily,  -tachycardic. Increase Lopressor to '50mg'$  TID 9.  Hypothyroidism.  Synthroid 10.  Dysphagia.  Dysphagia #2 thin liquids.  adv per SLP, watch for pocketing 11.  Hyperlipidemia.  Crestor 12.  Morbid obesity.  BMI 45.75.  Dietary follow-up 13.  AKI/prerenal.  BUN/Cr slightly more elevated today -encourage PO fluids  -labs look better today 8/1---dc IVF at HS 14. HTN: increase lopressor to '50mg'$  TID. 15. Prediabetes HgbA1c 6.2- d/c ensure. Schedule for outpatient Qutenza for diabetic peripheral neuropathy.  16. Hand joint OA:  voltaren gel ordered with improvement 17. Wrist drop: resting hand splint ordered.        LOS: 5 days A FACE TO FACE EVALUATION WAS PERFORMED  Clide Deutscher Fawne Hughley 12/14/2020, 12:09 PM

## 2020-12-14 NOTE — Progress Notes (Signed)
Patient ID: Candice Perez, female   DOB: 03-02-1951, 70 y.o.   MRN: [id number]  Met with pt to discuss team conference and goals of CGA level of assist and target discharge date of 8/16. She reports her husband has to be careful due to he has diabetes, but their two son's and daughter in-laws may help also, but all work. Will work on discharge needs and closer to date will have husband come in for hands on training. Husband has been coming to visit in the evenings.

## 2020-12-14 NOTE — Patient Care Conference (Signed)
Inpatient RehabilitationTeam Conference and Plan of Care Update Date: 12/14/2020   Time: 13:22 PM    Patient Name: Candice Perez      Medical Record Number: [id number]  Date of Birth: [birth year] Sex: Female         Room/Bed: [id number] Payor Info: Payor: [employer] MEDICARE / Plan: [employer] MEDICARE HMO/PPO / Product Type: *No Product type* /    Admit Date/Time:  12/09/2020  4:01 PM  Primary Diagnosis:  Left middle cerebral artery stroke [facility])  Hospital Problems: Principal Problem:   Left middle cerebral artery stroke [facility]) Active Problems:   Right hemiplegia [facility])    Expected Discharge Date: Expected Discharge Date: 12/28/20  Team Members Present: Physician leading conference: Dr. Leeroy Cha Social Worker Present: Ovidio Kin, LCSW Nurse Present: Dorien Chihuahua, RN PT Present: Ginnie Smart, PT OT Present: Leretha Pol, OT PPS Coordinator present : Gunnar Fusi, SLP     Current Status/Progress Goal Weekly Team Focus  Bowel/Bladder             Swallow/Nutrition/ Hydration   Dys 2 diet, thin liquids  Mod I  Dys 3 PO trials   ADL's   min assist functional transfers, mod- max assist LB self care, setup-supervision UB self care  CGA  ADL training, neuro re-ed RUE, transfers, endurance   Mobility   Bed mobility modA, Gait 65f minA with RW, stand<>pivot transfers minA. Limited by body habitus and RLE weakness/impaired sensation  supervision  Pt education and safety awareness, functional transfers, gait training with LRAD, RLE NMR   Communication   mod A  Supervision-to-min A  Speech production, awareness and repair of phonemic errors   Safety/Cognition/ Behavioral Observations  Min A  Mod I basic, supervision for mildly complex  further assessment recommended   Pain             Skin               Discharge Planning:  HOme with husband who can provide assist at discharge, here daily to provide support   Team Discussion: Patient with OA in right hand and  pain in bilateral feet. Progress limited by poor endurance, decreased right sensation, pain and body habitus. Patient on target to meet rehab goals: yes, currently  mod - max assist for lower body self care and supervision - set up for upper body care. Patient requires min assist for transfers, and is able to ambulate 15 ' with a RW and min assist. She requires mod assist for communication.Discharge goals set at supervision level.  *See Care Plan and progress notes for long and short-term goals.   Revisions to Treatment Plan:  Working on speech awareness, error awareness and correction, comprehension and decoding and pronunciation of words.  Trial D3 diet Working on endurance and neuro-re-education  Teaching Needs: Secondary stroke risk management, medications, transfers, toileting, safety, etc  Current Barriers to Discharge: Decreased caregiver support and Home enviroment access/layout  Possible Resolutions to Barriers: Family education     Medical Summary Current Status: excoriation on buttocks, fatigue, shortness of breath, obesity (BMI 44.96), right hand OA, atrial fibrillation, HTN, dysphagia  Barriers to Discharge: Medical stability;Weight;New diabetic;Wound care  Barriers to Discharge Comments: excoriation on buttocks, fatigue, shortness of breath, obesity (BMI 44.96), right hand OA, atrial fibrillation, HTN, dysphagia Possible Resolutions to [employer] continue Gerrhardt's butt cream, provide dietary education, continue voltaren gel, increase lopressor to '50mg'$  TID, continue dysphagia diet   Continued Need for Acute Rehabilitation Level of Care: The patient  requires daily medical management by a physician with specialized training in physical medicine and rehabilitation for the following reasons: Direction of a multidisciplinary physical rehabilitation program to maximize functional independence : Yes Medical management of patient stability for increased activity  during participation in an intensive rehabilitation regime.: Yes Analysis of laboratory values and/or radiology reports with any subsequent need for medication adjustment and/or medical intervention. : Yes   I attest that I was present, lead the team conference, and concur with the assessment and plan of the team.   Dorien Chihuahua B 12/14/2020, 3:56 PM

## 2020-12-14 NOTE — Progress Notes (Signed)
Physical Therapy Session Note  Patient Details  Name: Candice Perez MRN: [id number] Date of Birth: 10/13/1950  Today's Date: 12/14/2020 PT Individual Time: 0930-1000 + 8984-2103 PT Individual Time Calculation (min): 30 min + 54 min  Short Term Goals: Week 1:  PT Short Term Goal 1 (Week 1): Pt will complete bed mobility MinA PT Short Term Goal 2 (Week 1): Pt will ambulate 2f CGA PT Short Term Goal 3 (Week 1): Pt will initiate stair training PT Short Term Goal 4 (Week 1): Pt will transition STS CGA  Skilled Therapeutic Interventions/Progress Updates:      1st session: Pt seen for make-up time from missed session yesterday. Pt sitting in recliner to start session and agreeable to therapy. Reports no pain. Completes stand<>pivot with CGA/minA from recliner to w/c with cues for safety approach and hand placement. Wheeled for time from her room to downstairs day room rehab gym. Able to sit<>stand to RW with CGA and able to place her R hand in RW splint without assist. Worked on functional gait training where she ambulated 2x264fwith minA and RW. She has difficulty with RW management due to RUE weakness and struggles with maintaining straight path with RW. She also demonstrates R lean in standing that requires minA for balance/stability during gait. Completed 4 minutes of seated Kinetron with workload at 40cm/sec resistance. During seated rest breaks, discussed DC planning, fall prevention, and pt education on safety awareness. Handoff of care to SLP in speech room at end of session with all needs met.  2nd session: Pt in bed at start of session with RN at bedside adminstering medications. Pt denies any pain and is agreeable to therapy. Completes supine<>sit with supervision with use of bed features. Completes stand<>pivot transfer with minA for balance from EOB to w/c with cues for hand placement to armrests for safety approach. Transported downstairs to 55M rehab gym for time and wheeled in //  bars. Completed the following standing there-ex with BUE support to bar and used large mirror for visual feedback: -1x10 alternating hip abduction -1x15 alternating high knees -1x15 hamstring curls -side stepping L<>R along length // bars with CGA for safety   Wheeled to 6inch stairs to initiate stair training. She negotiated up/down 4, 6inch steps with minA and 2 hand rails - needed assist for guiding RUE along hand rail and cues for ascending with L foot leading and descending with R foot leading. Pt reports she has no stairs at home and doesn't typically do stairs unless absolutely needed.   Transported back upstairs (via elevator) to her room in her w/c. Placed w/c outside of room and she ambulated ~2571fo her EOB with minA and RW - cues for keeping body within walker frame and assist needed for RW management to maintain straight path. She was able to complete sit>supine with supervision and scooted herself up in bed with supervision with HOB flat. Bed alarm set and all needs in reach at end of session.    Therapy Documentation Precautions:  Precautions Precautions: Fall Restrictions Weight Bearing Restrictions: No Other Position/Activity Restrictions: Right Hemiplegia General:    Therapy/Group: Individual Therapy  ChrAlger Simons2/2022, 7:32 AM

## 2020-12-14 NOTE — Progress Notes (Signed)
Occupational Therapy Session Note  Patient Details  Name: Candice Perez MRN: [id number] Date of Birth: March 06, 1951  Today's Date: 12/14/2020 OT Individual Time: 0800-0900 OT Individual Time Calculation (min): 60 min    Short Term Goals: Week 1:  OT Short Term Goal 1 (Week 1): Pt will don shirt with min A. OT Short Term Goal 2 (Week 1): Pt will sit to stand with min to prep for toilet transfer. OT Short Term Goal 3 (Week 1): Pt will complete toileting with mod A. OT Short Term Goal 4 (Week 1): Pt will don pants with mod A. OT Short Term Goal 5 (Week 1): Pt will use RUE to wash LUE with guiding A.  Skilled Therapeutic Interventions/Progress Updates:    Pt received in bed and stated she was wet. Pt able to sit to EOB with S using rail.  This session, had pt do numerous sit to stands by pushing up with L hand from bed (then toilet rail, then wc arm rest, etc) to stand and THEN place R hand on walker handle spling then L hand.  Reversed procedure for sitting.  This enabled pt to focus on BLE stance and stability and she consistently completed all sit to stands with CGA.   Used RW to ambulate to toilet and then to wc at sink then to recliner all with min A and cues to ensure she was stepping R foot out to side first before shifting hips to R to sit.   Pt had very wet brief on with some BM. Needed max A to cleanse (total A for back side as pt had BM between buttocks and pt did front side). Completed sponge bath sitting on BSC with min A.  Max for pants due to having tight leggings, mod with bra and set up shirt.    Ambulated to wc at sink and then had her stand to do oral care.  With focusing on teeth, she frequently would "sink" to her R as her R knee would flex. Tried tapping leg to give her input to straighten leg but decreased sensation in leg inhibits her awareness.   Pt sat in recliner and had her focus on R hand with squeezing soft foam block.   Belt alarm on and all needs met.     Therapy Documentation Precautions:  Precautions Precautions: Fall Restrictions Weight Bearing Restrictions: No Other Position/Activity Restrictions: Right Hemiplegia    Vital Signs: Therapy Vitals Temp: 97.9 F (36.6 C) Temp Source: Oral Pulse Rate: 98 Resp: 18 BP: (!) 125/93 Patient Position (if appropriate): Lying Oxygen Therapy SpO2: 97 % O2 Device: Room Air Pain:  R hand pain, RN applied voltaren gel ADL: ADL Eating: Supervision/safety Grooming: Minimal assistance Where Assessed-Grooming: Standing at sink Upper Body Bathing: Minimal assistance Where Assessed-Upper Body Bathing: Wheelchair Lower Body Bathing: Moderate assistance Where Assessed-Lower Body Bathing: Wheelchair Upper Body Dressing: Moderate assistance Where Assessed-Upper Body Dressing: Wheelchair Lower Body Dressing: Maximal assistance Where Assessed-Lower Body Dressing: Wheelchair Toileting: Maximal assistance Where Assessed-Toileting: Glass blower/designer: Psychiatric nurse Method: Counselling psychologist: Grab bars   Therapy/Group: Individual Therapy  [place] 12/14/2020, 9:01 AM

## 2020-12-14 NOTE — Progress Notes (Signed)
Speech Language Pathology Daily Session Note  Patient Details  Name: Candice Perez MRN: [id number] Date of Birth: [birth year]  Today's Date: 12/14/2020 SLP Individual Time: 1000-1059 SLP Individual Time Calculation (min): 59 min  Short Term Goals: Week 1: SLP Short Term Goal 1 (Week 1): Patient will tolerate trials of Dys 3 solids without significant oral residuals, with minA cues. SLP Short Term Goal 2 (Week 1): Patient will imitate to produce phonemes in initial position ofwords (/s, z, f, "th", l/) with modA cues. SLP Short Term Goal 3 (Week 1): Patient will perform divergent naming task and name at least 10 items corresponding to a given category, with minA. SLP Short Term Goal 4 (Week 1): Patient will demonstrate awareness to phonemic errors during speech production at structured level (word, short phrase) with modA cues. SLP Short Term Goal 5 (Week 1): Patient will participate in further assessment of higher level cognitive function and reading comprehension. SLP Short Term Goal 6 (Week 1): Patient will follow two step verbal directions with 80% accuracy and minA cues.  Skilled Therapeutic Interventions:Skilled ST services focused on language skills. SLP facilitated assessment of reading comprehension skills administering subsections of the Reading Comprehension Battery for Aphasia. The scores are listed as following: 3/3 word level with visual confusion (matching word to picture) 5/5 word level with auditory confusion (matching word to picture) 5/5 word level with semantic confusion matching word to picture) 6/10 word level synonyms (matching word to word synonyms) 10/10 simple sentence comprehension 5-6 words (match sentence to picture) Pt was unable to decode nor comprehend at long sentence/ simple paragraph level.  Pt required verbal and visual cues (SLP demonstrated articulation placement utilizing clear mask) for decoding/producing at word level with min A fade to supervision A  verbal cues and ability to imitate correct articulation placement with 70-80% accuracy. Pt demonstrated increased difficulty producing initial /r/ and /r/ blend words. SLP returned pt to room and transferred from [facility] to bed with RW and min A. Pt was left in room with call bell within reach and bed alarm set. SLP recommends to continue skilled services.     Pain Pain Assessment Pain Score: 0-No pain  Therapy/Group: Individual Therapy  Sandralee Tarkington  [facility] 12/14/2020, 11:21 AM

## 2020-12-15 NOTE — Progress Notes (Signed)
Occupational Therapy Session Note  Patient Details  Name: Candice Perez MRN: [id number] Date of Birth: 10/16/1950  Today's Date: 12/15/2020 OT Individual Time: 0810-0910      Short Term Goals: Week 1:  OT Short Term Goal 1 (Week 1): Pt will don shirt with min A. OT Short Term Goal 2 (Week 1): Pt will sit to stand with min to prep for toilet transfer. OT Short Term Goal 3 (Week 1): Pt will complete toileting with mod A. OT Short Term Goal 4 (Week 1): Pt will don pants with mod A. OT Short Term Goal 5 (Week 1): Pt will use RUE to wash LUE with guiding A.  Skilled Therapeutic Interventions/Progress Updates:    Pt semi upright in bed, wincing and breathing heavily, appearing in pain.  Reports "my hand" when asked where pain was.  States not sleeping due to right hand pain.  Difficulty providing number level due to expressive aphasia.  She does state "took tart cherry three times a day for hand".  Notified MD of pts request to resume taking this supplement to address pain and sleep during hospital stay. Pt requesting to use toilet this AM.    Applied moist hot pack to right hand x 5 minutes to reduce right hand pain while setting up ADL items for self care.  Supine to sit with heavy bed rail usage and HOB slightly elevated with CGA.  Stand pivot to w/c using RW and walker splint on right with CGA.  Transported to bathroom and completed stand pivot to 3 in 1 commode using grab bar with CGA.  Pt pulled brief down using LUE with CGA.  Distant supervision while attempting continence.  Pt straining and unable to have bowel movement despite feeling like she needed to.  Nurse made aware.  Stand pivot to w/c with CGA.  Pt bathed and dressed UB with distant supervision seated at sink.  Max assist to donn clean brief.  Stand pivot to EOB using RW with CGA.  Total assist to donn clean grip socks sitting EOB due to body habitus and unable to reach bilateral feet.  Supervision sit to supine.  Call bell in reach,  bed alarm on at end of session.    Therapy Documentation Precautions:  Precautions Precautions: Fall Restrictions Weight Bearing Restrictions: No Other Position/Activity Restrictions: Right Hemiplegia    Therapy/Group: Individual Therapy  Ezekiel Slocumb 12/15/2020, 8:43 AM

## 2020-12-15 NOTE — Progress Notes (Signed)
PROGRESS NOTE   Subjective/Complaints: Continues to have numbness in bilateral feet- stable- interested in trying [place] [facility]- messaged Lattie Haw and we will try to get auth. Discussed lifestyle management of her prediabetes  ROS: +numbness both feet, +right hand joint pain    Objective:   No results found. Recent Labs    12/13/20 0627  WBC 9.2  HGB 12.4  HCT 37.9  PLT 398   Recent Labs    12/13/20 0627  NA 134*  K 4.3  CL 100  CO2 26  GLUCOSE 113*  BUN 26*  CREATININE 1.16*  CALCIUM 9.3    Intake/Output Summary (Last 24 hours) at 12/15/2020 1252 Last data filed at 12/15/2020 0700 Gross per 24 hour  Intake 360 ml  Output --  Net 360 ml        Physical Exam: Vital Signs Blood pressure (!) 141/71, pulse 67, temperature 97.7 F (36.5 C), temperature source Oral, resp. rate 17, height '5\' 7"'$  (1.702 m), weight 127.9 kg, SpO2 97 %. Gen: no distress, normal appearing HEENT: oral mucosa pink and moist, NCAT Cardio: Reg rate Chest: normal effort, normal rate of breathing Abd: soft, non-distended Ext: no edema Psych: Pt's affect is appropriate. Pt is cooperative Skin: NSL Right wrist. Healing abrasion/wound right forearm, sl tender Neuro: right central 7, ongoing word finding deficits. Speech non-fluent but able to speak in phrases and short sentences with extra time.  Reflexes are 2+ in all 4's. Fine motor coordination is intact. No tremors. Motor function is grossly 5/5 LUE and LLE. RUE 3+ to 4- prox to 1-2/5 at wrist and HI. RLE: 3-4/5 prox to distal. Sensation remains sl decreased to LT/PP RUE and RLE Musculoskeletal: tenderness with ROM of right wrist. No edema   Assessment/Plan: 1. Functional deficits which require 3+ hours per day of interdisciplinary therapy in a comprehensive inpatient rehab setting. Physiatrist is providing close team supervision and 24 hour management of active medical problems listed  below. Physiatrist and rehab team continue to assess barriers to discharge/monitor patient progress toward functional and medical goals  Care Tool:  Bathing    Body parts bathed by patient: Right arm, Left arm, Chest, Abdomen, Front perineal area, Right upper leg, Left upper leg, Face   Body parts bathed by helper: Buttocks, Right lower leg, Left lower leg Body parts n/a: Buttocks   Bathing assist Assist Level: Minimal Assistance - Patient > 75%     Upper Body Dressing/Undressing Upper body dressing   What is the patient wearing?: Pull over shirt, Bra    Upper body assist Assist Level: Moderate Assistance - Patient 50 - 74% (S with shirt, mod with bra)    Lower Body Dressing/Undressing Lower body dressing      What is the patient wearing?: Incontinence brief, Pants     Lower body assist Assist for lower body dressing: Maximal Assistance - Patient 25 - 49%     Toileting Toileting    Toileting assist Assist for toileting: Maximal Assistance - Patient 25 - 49%     Transfers Chair/bed transfer  Transfers assist     Chair/bed transfer assist level: Minimal Assistance - Patient > 75%     Locomotion  Ambulation   Ambulation assist      Assist level: Minimal Assistance - Patient > 75% Assistive device: Walker-rolling (hand orthossi) Max distance: 25'   Walk 10 feet activity   Assist     Assist level: Minimal Assistance - Patient > 75% Assistive device: Walker-rolling, Orthosis   Walk 50 feet activity   Assist Walk 50 feet with 2 turns activity did not occur: Safety/medical concerns         Walk 150 feet activity   Assist Walk 150 feet activity did not occur: Safety/medical concerns         Walk 10 feet on uneven surface  activity   Assist Walk 10 feet on uneven surfaces activity did not occur: Safety/medical concerns         Wheelchair     Assist Will patient use wheelchair at discharge?: No   Wheelchair activity did not  occur: N/A         Wheelchair 50 feet with 2 turns activity    Assist    Wheelchair 50 feet with 2 turns activity did not occur: N/A       Wheelchair 150 feet activity     Assist  Wheelchair 150 feet activity did not occur: N/A       Blood pressure (!) 141/71, pulse 67, temperature 97.7 F (36.5 C), temperature source Oral, resp. rate 17, height '5\' 7"'$  (1.702 m), weight 127.9 kg, SpO2 97 %.  Medical Problem List and Plan: 1.  Right side weakness and aphasia secondary to left MCA infarct due to left MCA occlusion status post thrombectomy with TIC12c etiology likely due to newly diagnosed atrial fibrillation             -patient may  shower             -ELOS/Goals: 14-16 days- supervision  --Continue CIR therapies including PT, OT, and SLP   2.  New onset atrial fibrillation: continue Eliquis, Amiodarone 200 mg daily, Lanoxin 0.125 mg daily, Cardizem 240 mg daily, increase Lopressor to '50mg'$  TID 3. Hand joint OA: voltaren gel ordered with improvement. Placed nursing order to indicate that patient may use her home tart cherry supplement three times per day.  4. Mood: Provide emotional support             -antipsychotic agents: N/A 5. Neuropsych: This patient is not ? capable of making decisions on her own behalf. 6. Skin/Wound Care: Local measures as needed RUE.  7. Fluids/Electrolytes/Nutrition: encourage appropriate PO  I personally reviewed the patient's labs today.   8.  New onset atrial fibrillation.   9.  Hypothyroidism.  Synthroid 10.  Dysphagia.  Dysphagia #2 thin liquids.  adv per SLP, watch for pocketing 11.  Hyperlipidemia.  Crestor 12.  Morbid obesity.  BMI 45.75.  Dietary follow-up 13.  AKI/prerenal.  BUN/Cr slightly more elevated today -encourage PO fluids  -labs look better today 8/1---dc IVF at HS 14. HTN: increase lopressor to '50mg'$  TID, continue to monitor for effects of yesterday's increase 15. Prediabetes HgbA1c 6.2- d/c ensure. Schedule for  outpatient Qutenza for diabetic peripheral neuropathy.  17. Wrist drop: resting hand splint ordered.        LOS: 6 days A FACE TO FACE EVALUATION WAS PERFORMED  Candice Perez 12/15/2020, 12:52 PM

## 2020-12-15 NOTE — Progress Notes (Signed)
Speech Language Pathology Daily Session Note  Patient Details  Name: Candice Perez MRN: [id number] Date of Birth: [birth year]  Today's Date: 12/15/2020 SLP Individual Time: 1105-1200 SLP Individual Time Calculation (min): 55 min  Short Term Goals: Week 1: SLP Short Term Goal 1 (Week 1): Patient will tolerate trials of Dys 3 solids without significant oral residuals, with minA cues. SLP Short Term Goal 2 (Week 1): Patient will imitate to produce phonemes in initial position ofwords (/s, z, f, "th", l/) with modA cues. SLP Short Term Goal 3 (Week 1): Patient will perform divergent naming task and name at least 10 items corresponding to a given category, with minA. SLP Short Term Goal 4 (Week 1): Patient will demonstrate awareness to phonemic errors during speech production at structured level (word, short phrase) with modA cues. SLP Short Term Goal 5 (Week 1): Patient will participate in further assessment of higher level cognitive function and reading comprehension. SLP Short Term Goal 6 (Week 1): Patient will follow two step verbal directions with 80% accuracy and minA cues.  Skilled Therapeutic Interventions:   Patient seen for skilled ST session focusing on aphasia goals and speech goals. She demonstrated 100% accuracy with task of reading sentences and matching to corresponding picture scene in field of 3. She demonstrated improved awareness and attempts to self-correct speech production errors as compared to initial evaluation. She was able to communicate verbally at phrase-conversational level with SLP as well as her church pastor who was present for portion of session. She was able to slow down and correct errors with SLP providing min-modA cues. She named 5 items in category during divergent naming task with no help for first 3 items and then semantic cues for other 2. She was able to describe object function and use with mod-maxA semantic cues. Patient continues to benefit from skilled SLP  intervention to maximize speech-language abilities prior to discharge.  Pain Pain Assessment Pain Scale: 0-10 Pain Score: 0-No pain Pain Type: Chronic pain Pain Location: Hand Pain Orientation: Right Pain Descriptors / Indicators: Aching Pain Frequency: Intermittent Pain Intervention(s): Medication (See eMAR)  Therapy/Group: Individual Therapy  Sonia Baller, MA, CCC-SLP Speech Therapy

## 2020-12-15 NOTE — Progress Notes (Signed)
Physical Therapy Session Note  Patient Details  Name: Candice Perez MRN: [id number] Date of Birth: July 31, 1950  Today's Date: 12/15/2020 PT Individual Time: 1300-1410 PT Individual Time Calculation (min): 70 min   Short Term Goals: Week 1:  PT Short Term Goal 1 (Week 1): Pt will complete bed mobility MinA PT Short Term Goal 2 (Week 1): Pt will ambulate 56f CGA PT Short Term Goal 3 (Week 1): Pt will initiate stair training PT Short Term Goal 4 (Week 1): Pt will transition STS CGA  Skilled Therapeutic Interventions/Progress Updates:     Pt supine in bed to start session. She's agreeable to therapy but reports mild R hand pain/discomfort, unrated. Reports she received Tylenol from Nursing prior to PT arrival. Pt completed supine<>sit with supervision with heavy reliance of bed rails. Requires minA for donning yoga pants while seated EOB and assist in pulling them up in standing. Completes stand<>pivot transfer with CGA from EOB to w/c with cues for safety awareness and hand placement. Pt wheeled downstairs in w/c for time to 83M rehab gym. Stand<>pivot transfer with CGA from w/c to mat table. Introduced wide based quad cane ([facility] to work on functional gait with LRAD. Deferred RW due to RUE numbness and difficulty with RW management. She ambulated 544f+ 6824f 65f75fth minA and WBQC on level ground - cues needed for cane sequencing and keeping cane closer to body as she tends to place it too far in front of her to step to it - sequencing improved with reps and distance. Next, worked on dynamic standing balance with alternating toe taps to 4inch block and using [facility] balance for LUE support - requiring minA for balance while completing this and pt expressing fearfulness of falling - balance and technique improved with 2nd set. Stand<>pivot transfer with CGA to w/c from mat table and wheeled to day room gym for time. Completed 4 minutes of Kinetron with resistance set to 40 cm/sec  - completed while  seated in w/c - verbal cues for technique and maximizing strokes by completing full ROM. Whe[facility] assisted back to bed via stand<>pivot with CGA. She was able to complete sit>supine with supervision and HOB raised. She was also able to scoot herself up in bed with supervision with HOB flat and use of bed rails. She remained supine in bed with HOB raised ~20deg at end of session, bed alarm on.  Therapy Documentation Precautions:  Precautions Precautions: Fall Restrictions Weight Bearing Restrictions: No Other Position/Activity Restrictions: Right Hemiplegia General:     Therapy/Group: Individual Therapy  ChriAlger Simons/2022, 7:41 AM

## 2020-12-16 MED ORDER — POLYETHYLENE GLYCOL 3350 17 G PO PACK
17.0000 g | PACK | Freq: Every day | ORAL | Status: DC
  Administered 2020-12-16 – 2020-12-30 (×14): 17 g via ORAL
  Filled 2020-12-16 (×15): qty 1

## 2020-12-16 MED ORDER — MAGNESIUM HYDROXIDE 400 MG/5ML PO SUSP: 15.0000 mL | Freq: Every day | ORAL | Status: DC | PRN

## 2020-12-16 MED ORDER — GABAPENTIN 300 MG PO CAPS
300.0000 mg | ORAL_CAPSULE | Freq: Every day | ORAL | Status: DC
  Administered 2020-12-16 – 2020-12-21 (×6): 300 mg via ORAL
  Filled 2020-12-16 (×6): qty 1

## 2020-12-16 NOTE — Progress Notes (Signed)
Speech Language Pathology Daily Session Note  Patient Details  Name: Candice Perez MRN: [id number] Date of Birth: December 15, 1950  Today's Date: 12/16/2020 SLP Individual Time: 1450-1535 SLP Individual Time Calculation (min): 45 min  Short Term Goals: Week 1: SLP Short Term Goal 1 (Week 1): Patient will tolerate trials of Dys 3 solids without significant oral residuals, with minA cues. SLP Short Term Goal 2 (Week 1): Patient will imitate to produce phonemes in initial position ofwords (/s, z, f, "th", l/) with modA cues. SLP Short Term Goal 3 (Week 1): Patient will perform divergent naming task and name at least 10 items corresponding to a given category, with minA. SLP Short Term Goal 4 (Week 1): Patient will demonstrate awareness to phonemic errors during speech production at structured level (word, short phrase) with modA cues. SLP Short Term Goal 5 (Week 1): Patient will participate in further assessment of higher level cognitive function and reading comprehension. SLP Short Term Goal 6 (Week 1): Patient will follow two step verbal directions with 80% accuracy and minA cues.  Skilled Therapeutic Interventions: Patient agreeable to skilled ST intervention with focus on cognitive-communication goals with further cognitive assessment. [employer] Mental Status (SLUMS) administered: 11/30 (score of <27 considered impairment based on patient's educational history). Score suggestive of significant cognitive deficits however difficult to interpret considering language deficits. Patient required additional processing time for most subtests. She recalled 2/5 novel words following short delay, and achieved 4/8 points on paragraph recall without cues. She recalled 100% of words and information when given semantic cues. She exhibited difficulty with tasks involving numbers including basic addition, subtraction, and digit reversal. She exhibited difficulty locating shapes on assessment paper for  visuospatial reasoning task suggestive of possible visual deficits which were not observed by this documenting therapist during previous encounter. Accuracy of clock drawing was impacting by impaired writing, however she also displayed difficulty with planning and organization of numbers, and decreased ability to self correct errors. Patient appears aware of deficits and remains motivated to participate in speech therapy for further improvement. Please see results below:  Orientation: 3/3 Word list recall: 2/5 (5/5 with semantic cues) Calculations: 0/3 Generative Naming: 1/3 Mental manipulation/digit reversal: 0/2 Clock Drawing/Executive Function: 0/4 Visuospatial: 1/2 Paragraph Recall: 4/8  Results reviewed with patient. Patient was left in bed with alarm activated and immediate needs within reach at end of session. Continue per current plan of care.      Pain Pain Assessment Pain Scale: 0-10 Pain Score: 0-No pain  Therapy/Group: Individual Therapy  Patty Sermons 12/16/2020, 3:55 PM

## 2020-12-16 NOTE — Progress Notes (Signed)
PROGRESS NOTE   Subjective/Complaints: Candice Perez is performing self care at sink She still has not received her tart cherry supplements- appreciate Victoria's help in getting these for her! Pain is keeping her up at night- will try some gabapentin tonight  ROS: +numbness both feet, +right hand joint pain, +insomnia   Objective:   No results found. No results for input(s): WBC, HGB, HCT, PLT in the last 72 hours.  No results for input(s): NA, K, CL, CO2, GLUCOSE, BUN, CREATININE, CALCIUM in the last 72 hours.   Intake/Output Summary (Last 24 hours) at 12/16/2020 1329 Last data filed at 12/16/2020 0726 Gross per 24 hour  Intake 238 ml  Output --  Net 238 ml        Physical Exam: Vital Signs Blood pressure 119/83, pulse 62, temperature 97.9 F (36.6 C), temperature source Oral, resp. rate 14, height '5\' 7"'$  (1.702 m), weight 124.6 kg, SpO2 95 %. Gen: no distress, normal appearing HEENT: oral mucosa pink and moist, NCAT Cardio: Reg rate Chest: normal effort, normal rate of breathing Abd: soft, non-distended Ext: no edema Psych: Pt's affect is appropriate. Pt is cooperative Skin: NSL Right wrist. Healing abrasion/wound right forearm, sl tender Neuro: right central 7, ongoing word finding deficits. Speech non-fluent but able to speak in phrases and short sentences with extra time.  Reflexes are 2+ in all 4's. Fine motor coordination is intact. No tremors. Motor function is grossly 5/5 LUE and LLE. RUE 3+ to 4- prox to 1-2/5 at wrist and HI. RLE: 3-4/5 prox to distal. Sensation remains sl decreased to LT/PP RUE and RLE Musculoskeletal: tenderness with ROM of right wrist. No edema   Assessment/Plan: 1. Functional deficits which require 3+ hours per day of interdisciplinary therapy in a comprehensive inpatient rehab setting. Physiatrist is providing close team supervision and 24 hour management of active medical problems  listed below. Physiatrist and rehab team continue to assess barriers to discharge/monitor patient progress toward functional and medical goals  Care Tool:  Bathing    Body parts bathed by patient: Right arm, Left arm, Chest, Abdomen, Front perineal area, Right upper leg, Left upper leg, Face   Body parts bathed by helper: Buttocks, Right lower leg, Left lower leg Body parts n/a: Buttocks   Bathing assist Assist Level: Minimal Assistance - Patient > 75%     Upper Body Dressing/Undressing Upper body dressing   What is the patient wearing?: Pull over shirt, Bra    Upper body assist Assist Level: Moderate Assistance - Patient 50 - 74% (S with shirt, mod with bra)    Lower Body Dressing/Undressing Lower body dressing      What is the patient wearing?: Incontinence brief, Pants     Lower body assist Assist for lower body dressing: Maximal Assistance - Patient 25 - 49%     Toileting Toileting    Toileting assist Assist for toileting: Maximal Assistance - Patient 25 - 49%     Transfers Chair/bed transfer  Transfers assist     Chair/bed transfer assist level: Minimal Assistance - Patient > 75%     Locomotion Ambulation   Ambulation assist      Assist level: Minimal Assistance -  Patient > 75% Assistive device: Cane-quad Max distance: 60   Walk 10 feet activity   Assist     Assist level: Minimal Assistance - Patient > 75% Assistive device: Cane-quad   Walk 50 feet activity   Assist Walk 50 feet with 2 turns activity did not occur: Safety/medical concerns  Assist level: Minimal Assistance - Patient > 75% Assistive device: Cane-quad    Walk 150 feet activity   Assist Walk 150 feet activity did not occur: Safety/medical concerns (fatigue)         Walk 10 feet on uneven surface  activity   Assist Walk 10 feet on uneven surfaces activity did not occur: Safety/medical concerns         Wheelchair     Assist Will patient use wheelchair  at discharge?: No   Wheelchair activity did not occur: N/A         Wheelchair 50 feet with 2 turns activity    Assist    Wheelchair 50 feet with 2 turns activity did not occur: N/A       Wheelchair 150 feet activity     Assist  Wheelchair 150 feet activity did not occur: N/A       Blood pressure 119/83, pulse 62, temperature 97.9 F (36.6 C), temperature source Oral, resp. rate 14, height '5\' 7"'$  (1.702 m), weight 124.6 kg, SpO2 95 %.  Medical Problem List and Plan: 1.  Right side weakness and aphasia secondary to left MCA infarct due to left MCA occlusion status post thrombectomy with TIC12c etiology likely due to newly diagnosed atrial fibrillation             -patient may  shower             -ELOS/Goals: 14-16 days- supervision  --Continue CIR therapies including PT, OT, and SLP   2.  New onset atrial fibrillation: continue Eliquis, Amiodarone 200 mg daily, Lanoxin 0.125 mg daily, Cardizem 240 mg daily, increase Lopressor to '50mg'$  TID 3. Hand joint OA: voltaren gel ordered with improvement. Placed nursing order to indicate that patient may use her home tart cherry supplement three times per day- we are trying to get this for her from pharmacy 4. Mood: Provide emotional support             -antipsychotic agents: N/A 5. Neuropsych: This patient is not ? capable of making decisions on her own behalf. 6. Skin/Wound Care: Local measures as needed RUE.  7. Fluids/Electrolytes/Nutrition: encourage appropriate PO  I personally reviewed the patient's labs today.   8.  New onset atrial fibrillation.   9.  Hypothyroidism.  Synthroid 10.  Dysphagia.  Dysphagia #2 thin liquids.  adv per SLP, watch for pocketing 11.  Hyperlipidemia.  Crestor 12.  Morbid obesity.  BMI 45.75.  Dietary follow-up 13.  AKI/prerenal.  BUN/Cr slightly more elevated today -encourage PO fluids  -labs look better today 8/1---dc IVF at HS 14. HTN: increase lopressor to '50mg'$  TID, continue to monitor for  effects of yesterday's increase 15. Prediabetes HgbA1c 6.2- d/c ensure. Schedule for outpatient Qutenza for diabetic peripheral neuropathy. Provided dietary education 17. Wrist drop: resting hand splint ordered.  18. Insomnia: related to right hand OA- will try '300mg'$  Gabapentin HS       LOS: 7 days A FACE TO FACE EVALUATION WAS PERFORMED  Candice Perez Candice Perez 12/16/2020, 1:29 PM

## 2020-12-16 NOTE — Progress Notes (Signed)
Physical Therapy Session Note  Patient Details  Name: Candice Perez MRN: [id number] Date of Birth: 1950-10-19  Today's Date: 12/16/2020 PT Individual Time: 0175-1025 + 1130-1155 PT Individual Time Calculation (min): 55 min  + 25 min  Short Term Goals: Week 1:  PT Short Term Goal 1 (Week 1): Pt will complete bed mobility MinA PT Short Term Goal 2 (Week 1): Pt will ambulate 66f CGA PT Short Term Goal 3 (Week 1): Pt will initiate stair training PT Short Term Goal 4 (Week 1): Pt will transition STS CGA  Skilled Therapeutic Interventions/Progress Updates:     1st session: Pt supine in bed to start - agreeable to PT session and reports mild L hand discomfort that she received Tylenol from her RN prior to PT arrival. Supine<>sit completed with supervision with use of bed features. Donned regular socks with setupA while seated EOB - she places her leg in figure-4 technique resting on the bed to assist, limited by body habitus. RUE sensory deficits impacting as she dropped the sock 3 times without noticing. Donned slip-on shoes with minA for time. Completed stand<>pivot transfer with minA for balance to w/c and then wheeled downstairs to dayroom rehab gym. Sit<>stand to wide based quad cane ([facility] with minA. Ambulated 670f+ 3021fith CGA and intermittent minA (due to R lean) on level surfaces - step-to gait pattern and decreased R foot clearance - verbal cues throughout for normalizing gait pattern and correcting R lean. Worked on blocked practice sit<>stands from w/c height - progressing for LUE assist to no UE assist, 2x5 reps each. Attempted to incorporate holding large therapy ball into sti<>stands to initiate forward weight shift and RUE proprioceptive feedback but pt unable to complete stand. Next, worked on dynamic standing balance and gait training by working on stepping forward/backwards over [place] with LUE support to [facility] modA for standing balance due to R lean. She's able to fully  clear the hurdle but lacks dynamic balance to complete without assist. Stand<>pivot transfer completed with supervision (!) and [facility] mat table to w/c. She was wheeled back upstairs for time management and remained seated in w/c with safety belt alarm on and all needs met, waiting for upcoming OT session.   2nd session: Pt seated in w/c to start session - agreeable to therapy but reports moderate fatigue from a busy morning of therapies. She requires some convincing to participate in mobility training and also requests to return to bed at end of session. Due to short session and fatigue, did not transport down to rehab gym. Sit<>stand to [facility] minA with cues needed for proper hand  placement. She ambulated a total of ~55f78f~55ft46fh minA and WBQC. Continues to demonstrate mild R lean and step-to gait pattern - assist needed primarily for balance and steadying during gait. She's also mildly short of breath after gait trials and requires a brief seated rest break for recovery. She was able to complete bed mobility with supervision and remained supine in bed with all needs in reach at end of session. Pt made comfortable.   Therapy Documentation Precautions:  Precautions Precautions: Fall Restrictions Weight Bearing Restrictions: No Other Position/Activity Restrictions: Right Hemiplegia General:    Therapy/Group: Individual Therapy  ChrisAlger Simons2022, 7:44 AM

## 2020-12-16 NOTE — Progress Notes (Signed)
Occupational Therapy Session Note  Patient Details  Name: Candice Perez MRN: [id number] Date of Birth: Jul 24, 1950  Today's Date: 12/16/2020 OT Individual Time: 1000-1100 OT Individual Time Calculation (min): 60 min    Short Term Goals: Week 1:  OT Short Term Goal 1 (Week 1): Pt will don shirt with min A. OT Short Term Goal 2 (Week 1): Pt will sit to stand with min to prep for toilet transfer. OT Short Term Goal 3 (Week 1): Pt will complete toileting with mod A. OT Short Term Goal 4 (Week 1): Pt will don pants with mod A. OT Short Term Goal 5 (Week 1): Pt will use RUE to wash LUE with guiding A.  Skilled Therapeutic Interventions/Progress Updates:    Pt sitting up in w/c, requesting to wash up a bit.  Reports had difficulty sleeping last night due to right hand pain described as achey.  Requesting to wash up a little at the sink.    Sit to stand needing multiple attempts after education on hand placement and rocking weight shifting technique to achieve full standing stance.  Pt ambulated to sink and brushed teeth, washed face in standing all with CGA and intermittent hand over hand right to grasp adl items due to absent sensation in RUE.  Pt then requesting to use bathroom urgently.  Transported pt to bathroom via w/c for safety. All functional mobility completed with CGA.  Stand pivot and 3 in 1 commode transfer.  Pt attempting to grasp grab bar with RUE but hand slipping and arm falling between wall and bar.  Educated pt to refrain from using grab bar unless someone is providing hand over hand to stabilize grasp.  Pt sat on commode with distant supervision for continent episode of bowel and bladder.  Pt opted to take pants and brief off completely in prep for LB bathing.  Pericare completed in standing with setup and CGA.  Stand pivot to w/c and setup at sink.  Pt doffed shirt and bathed UB with supervision and intermitent hand over hand to grasp soap, deoderant, washcloth in right hand.   If sensation does not return, pt may benefit from wash mitt to increase independence with bathing.    Pt donned brief and pants with max assist and socks and shoes with total assist.  Pt would benefit from training in reacher and sock aide for LB dressing.    Call bell in reach, seat alarm on at end of session.  Therapy Documentation Precautions:  Precautions Precautions: Fall Restrictions Weight Bearing Restrictions: No Other Position/Activity Restrictions: Right Hemiplegia    Therapy/Group: Individual Therapy  Ezekiel Slocumb 12/16/2020, 10:49 AM

## 2020-12-17 NOTE — Progress Notes (Signed)
Speech Language Pathology Daily Session Note  Patient Details  Name: Candice Perez MRN: [id number] Date of Birth: 1951/03/03  Today's Date: 12/17/2020 SLP Individual Time: 1300-1330 SLP Individual Time Calculation (min): 30 min  Short Term Goals: Week 1: SLP Short Term Goal 1 (Week 1): Patient will tolerate trials of Dys 3 solids without significant oral residuals, with minA cues. SLP Short Term Goal 2 (Week 1): Patient will imitate to produce phonemes in initial position ofwords (/s, z, f, "th", l/) with modA cues. SLP Short Term Goal 3 (Week 1): Patient will perform divergent naming task and name at least 10 items corresponding to a given category, with minA. SLP Short Term Goal 4 (Week 1): Patient will demonstrate awareness to phonemic errors during speech production at structured level (word, short phrase) with modA cues. SLP Short Term Goal 5 (Week 1): Patient will participate in further assessment of higher level cognitive function and reading comprehension. SLP Short Term Goal 6 (Week 1): Patient will follow two step verbal directions with 80% accuracy and minA cues.  Skilled Therapeutic Interventions: Pt seen for skilled ST with focus on speech and swallowing goals. Pt tolerating trials of Dys 3 textures with independent use of small bites, min cues to utilize liquid was to clear trace oral stasis after initial swallow. Pt with no overt s/s aspiration across any trials Dys 3/thin via straw, recommend trial Dys 3 tray before upgrading diet to determine fatigue factor and impact of oral stasis over whole meal. Pt continues to require min-mod A cues to ID phonemic errors in structured and unstructured language tasks. Initial /s/, /f/ and /l/ with improved accuracy provided model, /z/ and /th/ requiring mod-max cues for correct production in initial portion of word. Pt provided re-educated re: intelligibility strategies including slow rate and over articulate. Pt left in bed with alarm set and  all needs within reach. Cont ST POC.   Pain Pain Assessment Pain Scale: 0-10 Pain Score: 2  Pain Location: Hand  Therapy/Group: Individual Therapy  Dewaine Conger 12/17/2020, 1:23 PM

## 2020-12-17 NOTE — Progress Notes (Signed)
Speech Language Pathology Weekly Progress and Session Note  Patient Details  Name: Candice Perez MRN: [id number] Date of Birth: [birth year]  Beginning of progress report period: 12/10/2020 End of progress report period:  12/17/2020  Today's Date: 12/17/2020 SLP Individual Time: 1500-1530 SLP Individual Time Calculation (min): 30 min  Short Term Goals: Week 1: SLP Short Term Goal 1 (Week 1): Patient will tolerate trials of Dys 3 solids without significant oral residuals, with minA cues. SLP Short Term Goal 1 - Progress (Week 1): Progressing toward goal SLP Short Term Goal 2 (Week 1): Patient will imitate to produce phonemes in initial position ofwords (/s, z, f, "th", l/) with modA cues. SLP Short Term Goal 2 - Progress (Week 1): Progressing toward goal SLP Short Term Goal 3 (Week 1): Patient will perform divergent naming task and name at least 10 items corresponding to a given category, with minA. SLP Short Term Goal 3 - Progress (Week 1): Progressing toward goal SLP Short Term Goal 4 (Week 1): Patient will demonstrate awareness to phonemic errors during speech production at structured level (word, short phrase) with modA cues. SLP Short Term Goal 4 - Progress (Week 1): Met SLP Short Term Goal 5 (Week 1): Patient will participate in further assessment of higher level cognitive function and reading comprehension. SLP Short Term Goal 5 - Progress (Week 1): Met SLP Short Term Goal 6 (Week 1): Patient will follow two step verbal directions with 80% accuracy and minA cues. SLP Short Term Goal 6 - Progress (Week 1): Met    New Short Term Goals: Week 2: SLP Short Term Goal 1 (Week 2): Patient will perform divergent naming task and name at least 10 items corresponding to a given category, with minA. SLP Short Term Goal 2 (Week 2): Patient will tolerate trials of Dys 3 solids without significant oral residuals, with minA cues. SLP Short Term Goal 3 (Week 2): Patient will describe object  function/features with minA cues. SLP Short Term Goal 4 (Week 2): Patient will participate in more in depth assessment of her phoneme production errors. SLP Short Term Goal 5 (Week 2): Patient will imitate to produce errored phonemes (/s, z, f, "th", l/) with modA cues. SLP Short Term Goal 6 (Week 2): Patient will answer comprehension questions after reading 2-4 sentence paragraph/text with 80% accuracy and minA.  Weekly Progress Updates:  Patient has made good progress, meeting 3/6 STG's and making good progress towards goals she did not meet. She continues on Dys 2 solids diet but plan for likely upgrade in next couple days. She is demonstrating improved awareness to speech errors with attempts to self-correct. She continues to exhibit speech articulation errors/phonological processes which SLP plans to assess further.   Intensity: Minumum of 1-2 x/day, 30 to 90 minutes Frequency: 3 to 5 out of 7 days Duration/Length of Stay: 8/16 Treatment/Interventions: Cognitive remediation/compensation;Dysphagia/aspiration precaution training;Speech/Language facilitation;Environmental controls;Patient/family education;Functional tasks   Daily Session  Skilled Therapeutic Interventions: Patient seen for skilled ST session focusing on expressive aphasia. Patient improved with performance on convergent naming task after repeated trials and mod A fading to min-mod A cues from SLP. She independently recalled and reminded SLP that he had told her he would observe her with upgraded solids lunch tray today and SLP did apologize for forgetting to do that. (Plans for SLP to do tomorrow). Patient was able to communicate at phrase and conversational level with extra time and intermittent instances where she could not think of word she wanted to say. She  continues to benefit from skilled SLP intervention to maximize cognitive-linguistic, speech and swallow function prior to discharge.     General    Pain Pain  Assessment Pain Scale: 0-10 Pain Score: 0-No pain  Therapy/Group: Individual Therapy  Sonia Baller, MA, CCC-SLP Speech Therapy

## 2020-12-17 NOTE — Progress Notes (Signed)
PROGRESS NOTE   Subjective/Complaints: Candice Perez continues to complain of numbness in both feet and right hand pain, though latter has improved. RN was not able to find chart terry supplements from pharmacy- her family will bring again today and we will keep at her bedside for pain/insomnia  ROS: +numbness both feet, +right hand joint pain, +insomnia- improved   Objective:   No results found. No results for input(s): WBC, HGB, HCT, PLT in the last 72 hours.  No results for input(s): NA, K, CL, CO2, GLUCOSE, BUN, CREATININE, CALCIUM in the last 72 hours.   Intake/Output Summary (Last 24 hours) at 12/17/2020 1201 Last data filed at 12/17/2020 0805 Gross per 24 hour  Intake 495 ml  Output --  Net 495 ml        Physical Exam: Vital Signs Blood pressure 117/71, pulse (!) 57, temperature 98.3 F (36.8 C), temperature source Oral, resp. rate 16, height '5\' 7"'$  (1.702 m), weight 125.1 kg, SpO2 94 %. Gen: no distress, normal appearing HEENT: oral mucosa pink and moist, NCAT Cardio: Bradycardia Chest: normal effort, normal rate of breathing Abd: soft, non-distended Ext: no edema Psych: Pt's affect is appropriate. Pt is cooperative Skin: NSL Right wrist. Healing abrasion/wound right forearm, sl tender Neuro: right central 7, ongoing word finding deficits. Speech non-fluent but able to speak in phrases and short sentences with extra time.  Reflexes are 2+ in all 4's. Fine motor coordination is intact. No tremors. Motor function is grossly 5/5 LUE and LLE. RUE 3+ to 4- prox to 1-2/5 at wrist and HI. RLE: 3-4/5 prox to distal. Sensation remains sl decreased to LT/PP RUE and RLE Musculoskeletal: tenderness with ROM of right wrist. No edema   Assessment/Plan: 1. Functional deficits which require 3+ hours per day of interdisciplinary therapy in a comprehensive inpatient rehab setting. Physiatrist is providing close team supervision and  24 hour management of active medical problems listed below. Physiatrist and rehab team continue to assess barriers to discharge/monitor patient progress toward functional and medical goals  Care Tool:  Bathing    Body parts bathed by patient: Right arm, Left arm, Chest, Abdomen, Front perineal area, Right upper leg, Left upper leg, Face   Body parts bathed by helper: Buttocks, Right lower leg, Left lower leg Body parts n/a: Buttocks   Bathing assist Assist Level: Minimal Assistance - Patient > 75%     Upper Body Dressing/Undressing Upper body dressing   What is the patient wearing?: Pull over shirt, Bra    Upper body assist Assist Level: Moderate Assistance - Patient 50 - 74% (S with shirt, mod with bra)    Lower Body Dressing/Undressing Lower body dressing      What is the patient wearing?: Incontinence brief, Pants     Lower body assist Assist for lower body dressing: Maximal Assistance - Patient 25 - 49%     Toileting Toileting    Toileting assist Assist for toileting: Maximal Assistance - Patient 25 - 49%     Transfers Chair/bed transfer  Transfers assist     Chair/bed transfer assist level: Minimal Assistance - Patient > 75%     Locomotion Ambulation   Ambulation assist  Assist level: Minimal Assistance - Patient > 75% Assistive device: Cane-quad Max distance: 60   Walk 10 feet activity   Assist     Assist level: Minimal Assistance - Patient > 75% Assistive device: Cane-quad   Walk 50 feet activity   Assist Walk 50 feet with 2 turns activity did not occur: Safety/medical concerns  Assist level: Minimal Assistance - Patient > 75% Assistive device: Cane-quad    Walk 150 feet activity   Assist Walk 150 feet activity did not occur: Safety/medical concerns (fatigue)         Walk 10 feet on uneven surface  activity   Assist Walk 10 feet on uneven surfaces activity did not occur: Safety/medical concerns          Wheelchair     Assist Will patient use wheelchair at discharge?: No   Wheelchair activity did not occur: N/A         Wheelchair 50 feet with 2 turns activity    Assist    Wheelchair 50 feet with 2 turns activity did not occur: N/A       Wheelchair 150 feet activity     Assist  Wheelchair 150 feet activity did not occur: N/A       Blood pressure 117/71, pulse (!) 57, temperature 98.3 F (36.8 C), temperature source Oral, resp. rate 16, height '5\' 7"'$  (1.702 m), weight 125.1 kg, SpO2 94 %.  Medical Problem List and Plan: 1.  Right side weakness and aphasia secondary to left MCA infarct due to left MCA occlusion status post thrombectomy with TIC12c etiology likely due to newly diagnosed atrial fibrillation             -patient may  shower             -ELOS/Goals: 14-16 days- supervision  --Continue CIR therapies including PT, OT, and SLP   2.  New onset atrial fibrillation: continue Eliquis, Amiodarone 200 mg daily, Lanoxin 0.125 mg daily, Cardizem 240 mg daily, increase Lopressor to '50mg'$  TID 3. Hand joint OA: voltaren gel ordered with improvement. Placed nursing order to indicate that patient may use her home tart cherry supplement three times per day- we were unable to get this back from pharmacy- family will bring in new bottle and discussed with nursing to leave this at her bedside 4. Mood: Provide emotional support             -antipsychotic agents: N/A 5. Neuropsych: This patient is not ? capable of making decisions on her own behalf. 6. Skin/Wound Care: Local measures as needed RUE.  7. Fluids/Electrolytes/Nutrition: encourage appropriate PO  I personally reviewed the patient's labs today.   8.  New onset atrial fibrillation.   9.  Hypothyroidism.  Continue Synthroid 10.  Dysphagia.  Dysphagia #2 thin liquids.  adv per SLP, watch for pocketing 11.  Hyperlipidemia.  Continue Crestor 12.  Morbid obesity.  BMI 45.75.  Dietary follow-up 13.  AKI/prerenal.   BUN/Cr slightly more elevated today -encourage PO fluids  -labs look better today 8/1---dc IVF at HS 14. HTN: increase lopressor to '50mg'$  TID, continue to monitor for effects of yesterday's increase 15. Prediabetes HgbA1c 6.2- d/c ensure. Schedule for outpatient Qutenza for diabetic peripheral neuropathy. Provided dietary education 17. Wrist drop: resting hand splint ordered.  18. Insomnia: related to right hand OA- will try '300mg'$  Gabapentin HS- helped       LOS: 8 days A FACE TO FACE EVALUATION WAS PERFORMED  Drew Lips P Billye Pickerel 12/17/2020, 12:01  PM

## 2020-12-17 NOTE — Progress Notes (Signed)
Physical Therapy Weekly Progress Note  Patient Details  Name: Candice Perez MRN: [id number] Date of Birth: [birth year]  Beginning of progress report period: December 10, 2020 End of progress report period: December 17, 2020  Today's Date: 12/17/2020 PT Individual Time: 0800-0910 PT Individual Time Calculation (min): 70 min   Patient has met 2 of 4 short term goals.  Pt is making steady progress towards her goals. She is able to complete bed mobility with supervision (relies heavily on bed rails to assist), is able to complete sit<>stand transfers that fluctuate b/w CGA and minA, completes bed<>chair transfers with minA and no AD, and ambulates ~79f with minA and wide-based quad cane ([facility]. She is primarily limited by absent sensation on R hemibody, standing balance, global deconditioning (likely premorbid), body habitus, and R hemibody proprioception deficits.   Patient continues to demonstrate the following deficits muscle weakness, decreased cardiorespiratoy endurance, unbalanced muscle activation, decreased attention to right, decreased attention, decreased awareness, decreased problem solving, and decreased safety awareness, and decreased standing balance, hemiplegia, and decreased balance strategies and therefore will continue to benefit from skilled PT intervention to increase functional independence with mobility.  Patient progressing toward long term goals..  Continue plan of care.  PT Short Term Goals Week 1:  PT Short Term Goal 1 (Week 1): Pt will complete bed mobility MinA PT Short Term Goal 1 - Progress (Week 1): Met PT Short Term Goal 2 (Week 1): Pt will ambulate [id number] PT Short Term Goal 2 - Progress (Week 1): Not met (requires miNA) PT Short Term Goal 3 (Week 1): Pt will initiate stair training PT Short Term Goal 3 - Progress (Week 1): Met PT Short Term Goal 4 (Week 1): Pt will transition STS CGA PT Short Term Goal 4 - Progress (Week 1): Partly met (inconsistent. Fluctuates b/w  CGA and minA) Week 2:  PT Short Term Goal 1 (Week 2): Pt will complete bed mobility mod I PT Short Term Goal 2 (Week 2): Pt will complete bed<>chair transfers consistently with CGA and LRAD PT Short Term Goal 3 (Week 2): Pt will ambulate 1003fith CGA and LRAD  Skilled Therapeutic Interventions/Progress Updates:     Pt supine in bed to start session - agreeable to therapy. Denies any pain and reports decent night's sleep. HOB lowered to flat and instructed pt to complete supine<>sit without bed features. She was able to complete supine<>sit with supervision but very effortful and limited by body habitus and R sided weakness. Donned socks and shoes while seated EOB with setupA only. Completed stand<>pivot transfer with CGA from EOB to w/c and then wheeled downstairs to 48M ortho rehab gym. Completed ambulatory car transfer with CGA and wide based quad cane ([facility] height set to simulate her Rav4. Increased difficulty getting her LLE into and out of the car, again limited by body habitus. Next, completed standing interval training with 3#ankle weights attached bilaterally with 1:52m50mf standing toe taps to 4inch platform with BUE support rail and 2min22mst break - completed 3 sets of this. Next, assisted onto the Nustep (required assist for BLE placement) and she completed a total of 5 minutes (rest break at 2min:40msec) with workload set to 3, using BLE's only, working on global endurance and strengthening. She ambulated a total of ~25ft +34ft + 63f + [id number] session with CGA and WBQC. She was returned upstairs and was agreeable to remain seated in w/c for upcoming OT session. Safety belt alarm on and  needs in reach.   Therapy Documentation Precautions:  Precautions Precautions: Fall Restrictions Weight Bearing Restrictions: No Other Position/Activity Restrictions: Right Hemiplegia General:    Therapy/Group: Individual Therapy  Alger Simons 12/17/2020, 7:44 AM

## 2020-12-17 NOTE — Progress Notes (Signed)
Occupational Therapy Weekly Progress Note  Patient Details  Name: Candice Perez MRN: [id number] Date of Birth: 11/03/1950  Beginning of progress report period: December 10, 2020 End of progress report period: December 17, 2020  Today's Date: 12/17/2020 OT Individual Time: 5456-2563 OT Individual Time Calculation (min): 55 min    Patient has met 5 of 5 short term goals.  Pt has been making excellent progress with her balance, R side coordination. Her main challenge is the total loss of sensation in R hand so she really can not feel anything she is holding.  She gets very fatigued and short of breath easily.  HR and O2 sats WNL.    Patient continues to demonstrate the following deficits: decreased cardiorespiratoy endurance, unbalanced muscle activation and decreased coordination, and decreased standing balance, decreased postural control, and hemiplegia and therefore will continue to benefit from skilled OT intervention to enhance overall performance with BADL.  Patient progressing toward long term goals. of CGA.   Continue plan of care.  OT Short Term Goals Week 1:  OT Short Term Goal 1 (Week 1): Pt will don shirt with min A. OT Short Term Goal 1 - Progress (Week 1): Met OT Short Term Goal 2 (Week 1): Pt will sit to stand with min to prep for toilet transfer. OT Short Term Goal 2 - Progress (Week 1): Met OT Short Term Goal 3 (Week 1): Pt will complete toileting with mod A. OT Short Term Goal 3 - Progress (Week 1): Met OT Short Term Goal 4 (Week 1): Pt will don pants with mod A. OT Short Term Goal 4 - Progress (Week 1): Met OT Short Term Goal 5 (Week 1): Pt will use RUE to wash LUE with guiding A. OT Short Term Goal 5 - Progress (Week 1): Met Week 2:  OT Short Term Goal 1 (Week 2): Pt will complete toilet transfers with Supervision. OT Short Term Goal 2 (Week 2): Pt will complete toileting with min A. OT Short Term Goal 3 (Week 2): Pt will don pants with min A.  Skilled Therapeutic  Interventions/Progress Updates:    Pt received in wc ready for therapy. Pt taken to gym to focus on coordination of R UE with Bimanual task of holding hula hoop and rotating hoop to work on sh flexion, overhead reaching with hoop for elbow extension all while concentrating on maintaining her grasp. Sit to stands with pushing up and reaching back and then holding static stand without UE support. Pt did very well with this at a light CGA to close S level. Practiced short distance ambulation.   Worked on standing balance with trunk rotation and partial squats to increase pt's L arm ROM to be able to reach and self cleanse.  She is R handed but her lack of sensation makes self cleansing extremely difficult.  Simulated this action.    Returned to room to work on ambulating in and out of bathroom with quad cane with CGA to toilet. Pt does better just sitting on regular toilet due to leg length. Stood with CGA (using L side rail of BSC to push up from) and ambulated to sit EOB.  Sit to Supine with S.   Pt very tired and resting in bed with all needs met and RN in room with her.    Therapy Documentation Precautions:  Precautions Precautions: Fall Restrictions Weight Bearing Restrictions: No Other Position/Activity Restrictions: Right Hemiplegia  Pain: Pain Assessment Pain Score: 0-No pain ADL: ADL Eating: Supervision/safety Grooming:  Setup Where Assessed-Grooming: Standing at sink Upper Body Bathing: Minimal assistance Where Assessed-Upper Body Bathing: Wheelchair Lower Body Bathing: Minimal assistance Where Assessed-Lower Body Bathing: Wheelchair Upper Body Dressing: Minimal assistance Where Assessed-Upper Body Dressing: Wheelchair Lower Body Dressing: Moderate assistance Where Assessed-Lower Body Dressing: Wheelchair Toileting: Moderate assistance Where Assessed-Toileting: Glass blower/designer: Minimal assistance, Therapist, music Method: Information systems manager: Grab bars   Therapy/Group: Individual Therapy  Sch[place] 12/17/2020, 12:56 PM

## 2020-12-18 LAB — GLUCOSE, CAPILLARY: Glucose-Capillary: 233 mg/dL — ABNORMAL HIGH (ref 70–99)

## 2020-12-18 NOTE — Progress Notes (Signed)
Physical Therapy Session Note  Patient Details  Name: Candice Perez MRN: [id number] Date of Birth: Jul 03, 1950  Today's Date: 12/18/2020 PT Individual Time: 1100-1158 PT Individual Time Calculation (min): 58 min   Short Term Goals: Week 2:  PT Short Term Goal 1 (Week 2): Pt will complete bed mobility mod I PT Short Term Goal 2 (Week 2): Pt will complete bed<>chair transfers consistently with CGA and LRAD PT Short Term Goal 3 (Week 2): Pt will ambulate 19f with CGA and LRAD  Skilled Therapeutic Interventions/Progress Updates:  Session focused on Week 2 goals. Pt was received supine in bed and agreeable to PT. At start of session, pt transferred to EOB with supervision,and then [facility] Pt transferred to therapy apartment for time conservation. In therapy apartment, pt practiced multiple furniture transfers including transfer to/from recliner, couch, and kitchen chair with CGA + LBQC. Of note, pt required seated rest break between couch and kitchen chair. Pt then ambulated 767fwith CGA + LBQC x 3 to continue progressing towards independent ambulation and increase activity tolerance. Pt then stood at counter and practice reaching for objects with RUE. Pt noted to have difficulty with picking up heavier objects. Pt then returned to room where she practice dynamic standing balance completing tandem stance with MinA for 30secs x 3 rounds. At end of session, pt was left seated in recliner with call bell and all needs in reach.  Therapy Documentation Precautions:  Precautions Precautions: Fall Restrictions Weight Bearing Restrictions: No Other Position/Activity Restrictions: Right Hemiplegia  Pain: Pain Assessment Pain Scale: 0-10 Pain Score: 0-No pain   Therapy/Group: Individual Therapy  KiMarquette SaaPT, DPT 12/18/2020, 4:12 PM

## 2020-12-18 NOTE — Progress Notes (Signed)
Speech Language Pathology Daily Session Note  Patient Details  Name: Candice Perez MRN: [id number] Date of Birth: January 06, 1951  Today's Date: 12/18/2020 SLP Individual Time: 1230-1300 SLP Individual Time Calculation (min): 30 min  Short Term Goals: Week 2: SLP Short Term Goal 1 (Week 2): Patient will perform divergent naming task and name at least 10 items corresponding to a given category, with minA. SLP Short Term Goal 2 (Week 2): Patient will tolerate trials of Dys 3 solids without significant oral residuals, with minA cues. SLP Short Term Goal 3 (Week 2): Patient will describe object function/features with minA cues. SLP Short Term Goal 4 (Week 2): Patient will participate in more in depth assessment of her phoneme production errors. SLP Short Term Goal 5 (Week 2): Patient will imitate to produce errored phonemes (/s, z, f, "th", l/) with modA cues. SLP Short Term Goal 6 (Week 2): Patient will answer comprehension questions after reading 2-4 sentence paragraph/text with 80% accuracy and minA.  Skilled Therapeutic Interventions: Pt seen for skilled ST with focus on swallowing goals, pt presented trial Dys 3 tray to determine tolerance for upgrade. Pt utilizing swallow strategies as trained (small bites, slow rate, alternate solids/liquids and lingual sweep to R cheek) with supervision level cues throughout meal. Pt with trace oral stasis effectively cleared with lingual sweep and/or liquid wash. Pt able to regulate bolus size on spoon despite utilizing non-dominant hand for self feeding. Pt with no overt s/s aspiration across any trials, pt appropriate for Dys 3 diet with thin liquids. Pt left upright in recliner with all needs within reach and alarm belt activated. Cont ST POC.   Pain Pain Assessment Pain Scale: 0-10 Pain Score: 0-No pain  Therapy/Group: Individual Therapy  Dewaine Conger 12/18/2020, 12:43 PM

## 2020-12-18 NOTE — Progress Notes (Signed)
PROGRESS NOTE   Subjective/Complaints: Family brought in tart cherry caps from outside. She's happy so that she can use for hands!  ROS: Patient denies fever, rash, sore throat, blurred vision, nausea, vomiting, diarrhea, cough, shortness of breath or chest pain,   headache, or mood change.    Objective:   No results found. No results for input(s): WBC, HGB, HCT, PLT in the last 72 hours.  No results for input(s): NA, K, CL, CO2, GLUCOSE, BUN, CREATININE, CALCIUM in the last 72 hours.   Intake/Output Summary (Last 24 hours) at 12/18/2020 1332 Last data filed at 12/18/2020 [id number] Gross per 24 hour  Intake 712 ml  Output --  Net 712 ml        Physical Exam: Vital Signs Blood pressure 122/63, pulse (!) 54, temperature 98 F (36.7 C), temperature source Oral, resp. rate 17, height '5\' 7"'$  (1.702 m), weight 124 kg, SpO2 93 %. Constitutional: No distress . Vital signs reviewed. HEENT: NCAT, EOMI, oral membranes moist Neck: supple Cardiovascular: RRR without murmur. No JVD    Respiratory/Chest: CTA Bilaterally without wheezes or rales. Normal effort    GI/Abdomen: BS +, non-tender, non-distended Ext: no clubbing, cyanosis, or edema Psych: pleasant and cooperative  Skin: NSL Right wrist. Healing abrasion/wound right forearm, sl tender Neuro: right central 7, ongoing word finding deficits. Speech non-fluent but able to speak in phrases and short sentences with extra time.  Reflexes are 2+ in all 4's. Fine motor coordination is intact. No tremors. Motor function is grossly 5/5 LUE and LLE. RUE 3+ to 4- prox to 1-2/5 at wrist and HI. RLE: 3-4/5 prox to distal. Sensation remains sl decreased to LT/PP RUE and RLE Musculoskeletal: tenderness with ROM of right wrist. No edema   Assessment/Plan: 1. Functional deficits which require 3+ hours per day of interdisciplinary therapy in a comprehensive inpatient rehab setting. Physiatrist is  providing close team supervision and 24 hour management of active medical problems listed below. Physiatrist and rehab team continue to assess barriers to discharge/monitor patient progress toward functional and medical goals  Care Tool:  Bathing    Body parts bathed by patient: Right arm, Left arm, Chest, Abdomen, Front perineal area, Right upper leg, Left upper leg, Face   Body parts bathed by helper: Buttocks, Right lower leg, Left lower leg Body parts n/a: Buttocks   Bathing assist Assist Level: Minimal Assistance - Patient > 75%     Upper Body Dressing/Undressing Upper body dressing   What is the patient wearing?: Pull over shirt, Bra    Upper body assist Assist Level: Moderate Assistance - Patient 50 - 74% (S with shirt, mod with bra)    Lower Body Dressing/Undressing Lower body dressing      What is the patient wearing?: Incontinence brief, Pants     Lower body assist Assist for lower body dressing: Maximal Assistance - Patient 25 - 49%     Toileting Toileting    Toileting assist Assist for toileting: Moderate Assistance - Patient 50 - 74%     Transfers Chair/bed transfer  Transfers assist     Chair/bed transfer assist level: Contact Guard/Touching assist     Locomotion Ambulation  Ambulation assist      Assist level: Minimal Assistance - Patient > 75% Assistive device: Cane-quad Max distance: 60   Walk 10 feet activity   Assist     Assist level: Minimal Assistance - Patient > 75% Assistive device: Cane-quad   Walk 50 feet activity   Assist Walk 50 feet with 2 turns activity did not occur: Safety/medical concerns  Assist level: Minimal Assistance - Patient > 75% Assistive device: Cane-quad    Walk 150 feet activity   Assist Walk 150 feet activity did not occur: Safety/medical concerns (fatigue)         Walk 10 feet on uneven surface  activity   Assist Walk 10 feet on uneven surfaces activity did not occur: Safety/medical  concerns         Wheelchair     Assist Will patient use wheelchair at discharge?: No   Wheelchair activity did not occur: N/A         Wheelchair 50 feet with 2 turns activity    Assist    Wheelchair 50 feet with 2 turns activity did not occur: N/A       Wheelchair 150 feet activity     Assist  Wheelchair 150 feet activity did not occur: N/A       Blood pressure 122/63, pulse (!) 54, temperature 98 F (36.7 C), temperature source Oral, resp. rate 17, height '5\' 7"'$  (1.702 m), weight 124 kg, SpO2 93 %.  Medical Problem List and Plan: 1.  Right side weakness and aphasia secondary to left MCA infarct due to left MCA occlusion status post thrombectomy with TIC12c etiology likely due to newly diagnosed atrial fibrillation             -patient may  shower             -ELOS/Goals: 14-16 days- supervision  --Continue CIR therapies including PT, OT, and SLP   2.  New onset atrial fibrillation: continue Eliquis, Amiodarone 200 mg daily, Lanoxin 0.125 mg daily, Cardizem 240 mg daily, increase Lopressor to '50mg'$  TID 3. Hand joint OA: voltaren gel ordered with improvement. Placed nursing order to indicate that patient may use her home tart cherry supplement three times per day- we were unable to get this back from pharmacy- family will bring in new bottle and discussed with nursing to leave this at her bedside  -Also reviewed the following additional supplement which can be helpful for her OA: OMEGA 3 FATTY ACIDS, TURMERIC, GINGER,  CELERY SEED, GLUCOSAMINE WITH CHONDROITIN   4. Mood: Provide emotional support             -antipsychotic agents: N/A 5. Neuropsych: This patient is not ? capable of making decisions on her own behalf. 6. Skin/Wound Care: Local measures as needed RUE.  7. Fluids/Electrolytes/Nutrition: encourage appropriate PO   .   8.  New onset atrial fibrillation.   9.  Hypothyroidism.  Continue Synthroid 10.  Dysphagia.  Dysphagia #2 thin liquids.  adv per  SLP, watch for pocketing 11.  Hyperlipidemia.  Continue Crestor 12.  Morbid obesity.  BMI 45.75.  Dietary follow-up 13.  AKI/prerenal.  BUN/Cr slightly more elevated today -encourage PO fluids  -labs look better today 8/1---dc IVF at HS 14. HTN: increase lopressor to '50mg'$  TID, continue to monitor for effects of yesterday's increase 15. Prediabetes HgbA1c 6.2- d/c ensure. Schedule for outpatient Qutenza for diabetic peripheral neuropathy. Provided dietary education 17. Wrist drop: resting hand splint ordered.  18. Insomnia: related to right  hand OA-   '300mg'$  Gabapentin HS- helped       LOS: 9 days A FACE TO FACE EVALUATION WAS PERFORMED  Meredith Staggers 12/18/2020, 1:32 PM

## 2020-12-19 NOTE — Progress Notes (Signed)
Speech Language Pathology Daily Session Note  Patient Details  Name: Candice Perez MRN: [id number] Date of Birth: [birth year]  Today's Date: 12/19/2020 SLP Individual Time: 1450-1520 SLP Individual Time Calculation (min): 30 min  Short Term Goals: Week 2: SLP Short Term Goal 1 (Week 2): Patient will perform divergent naming task and name at least 10 items corresponding to a given category, with minA. SLP Short Term Goal 2 (Week 2): Patient will tolerate trials of Dys 3 solids without significant oral residuals, with minA cues. SLP Short Term Goal 3 (Week 2): Patient will describe object function/features with minA cues. SLP Short Term Goal 4 (Week 2): Patient will participate in more in depth assessment of her phoneme production errors. SLP Short Term Goal 5 (Week 2): Patient will imitate to produce errored phonemes (/s, z, f, "th", l/) with modA cues. SLP Short Term Goal 6 (Week 2): Patient will answer comprehension questions after reading 2-4 sentence paragraph/text with 80% accuracy and minA.  Skilled Therapeutic Interventions:  Pt was seen for skilled ST targeting communication goals.  SLP facilitated the session with structured word association tasks to address error awareness and divergent naming goals.  Pt was able to name targeted category items with min assist verbal cues but needed mod-max assist multimodal cues to correct phonemic paraphasic errors.  Pt was left in bed with bed alarm set and call bell within reach.  Continue per current plan of care.    Pain Pain Assessment Pain Scale: 0-10 Pain Score: 0-No pain  Therapy/Group: Individual Therapy  Racheal Mathurin, Selinda Orion 12/19/2020, 3:35 PM

## 2020-12-19 NOTE — Progress Notes (Signed)
Physical Therapy Session Note  Patient Details  Name: Candice Perez MRN: [id number] Date of Birth: May 28, 1950  Today's Date: 12/19/2020 PT Individual Time: 1000-1057 PT Individual Time Calculation (min): 57 min   Short Term Goals: Week 2:  PT Short Term Goal 1 (Week 2): Pt will complete bed mobility mod I PT Short Term Goal 2 (Week 2): Pt will complete bed<>chair transfers consistently with CGA and LRAD PT Short Term Goal 3 (Week 2): Pt will ambulate 119f with CGA and LRAD  Skilled Therapeutic Interventions/Progress Updates:  Session focused on Week 2 goals with the addition of ambulation on outdoor surfaces to promote community reintegration. Pt was received supine in bed completing dressing with NT, and agreeable to PT. At start of session, pt transferred to EOB with supervision,and then donned shoes [facility]. Pt transferred outside for time conservation. Outside, pt practiced multiple rounds of ambulation and transfers around ourdoor seating. Pt ambulated 511f [id number] seated rest breaks between. Pt consistently required CGA + LBQC during ambulation around flat and uneven concrete surfaces. During seated rest breaks, pt engaged in RUE and R hand exercises including shoulder flexion, shoulder adduction, finger taps, finger ext/flex, and supination/pronation for 2 sets of 10 during breaks.  At end of session, pt was left seated in recliner with call bell and all needs in reach.   Therapy Documentation Precautions:  Precautions Precautions: Fall Restrictions Weight Bearing Restrictions: No Other Position/Activity Restrictions: Right Hemiplegia  Pain: 0/10    Therapy/Group: Individual Therapy  Orly Quimby Marquette Saa DPT 12/19/2020, 12:46 PM

## 2020-12-20 MED ORDER — METOPROLOL TARTRATE 50 MG PO TABS: 75.0000 mg | ORAL_TABLET | Freq: Three times a day (TID) | ORAL | Status: DC

## 2020-12-20 MED ORDER — HYDROCORTISONE 1 % EX CREA
TOPICAL_CREAM | Freq: Two times a day (BID) | CUTANEOUS | Status: DC
  Filled 2020-12-20: qty 28

## 2020-12-20 MED ORDER — METOPROLOL TARTRATE 50 MG PO TABS
62.5000 mg | ORAL_TABLET | Freq: Three times a day (TID) | ORAL | Status: DC
  Administered 2020-12-20 – 2020-12-27 (×20): 62.5 mg via ORAL
  Filled 2020-12-20 (×21): qty 1

## 2020-12-20 NOTE — Progress Notes (Signed)
Physical Therapy Session Note  Patient Details  Name: Candice Perez MRN: [id number] Date of Birth: August 19, 1950  Today's Date: 12/20/2020 PT Individual Time: 1300-1313 PT Individual Time Calculation (min): 13 min   Short Term Goals: Week 2:  PT Short Term Goal 1 (Week 2): Pt will complete bed mobility mod I PT Short Term Goal 2 (Week 2): Pt will complete bed<>chair transfers consistently with CGA and LRAD PT Short Term Goal 3 (Week 2): Pt will ambulate 126f with CGA and LRAD  Skilled Therapeutic Interventions/Progress Updates:    Pt supine in bed with HOB raised at start of session - eating her meal. Pt pleasantly deferring therapy. Reports she's too tired and wants rest. Pt noted to be sliding down in the bed and poor positioned for eating her meal. Assisted her with bed mobility with minA for repositioning higher in the bed and placing bed in chair position for her meal. Pt appreciative but continued to defer therapy. Pt missed 33 minutes of skilled therapy.  Therapy Documentation Precautions:  Precautions Precautions: Fall Restrictions Weight Bearing Restrictions: No Other Position/Activity Restrictions: Right Hemiplegia General: PT Amount of Missed Time (min): 33 Minutes PT Missed Treatment Reason: Patient fatigue;Patient unwilling to participate  Therapy/Group: Individual Therapy  CAlger Simons8/12/2020, 7:38 AM

## 2020-12-20 NOTE — Progress Notes (Signed)
PROGRESS NOTE   Subjective/Complaints: Not feeling well today. Has rash on bilateral arms and back- discussed hypoallergenic sheets with nursing and ordered hydrocortisone cream   ROS: Patient denies fever, rash, sore throat, blurred vision, nausea, vomiting, diarrhea, cough, shortness of breath or chest pain,   headache, or mood change.    Objective:   No results found. No results for input(s): WBC, HGB, HCT, PLT in the last 72 hours.  No results for input(s): NA, K, CL, CO2, GLUCOSE, BUN, CREATININE, CALCIUM in the last 72 hours.   Intake/Output Summary (Last 24 hours) at 12/20/2020 1342 Last data filed at 12/20/2020 1257 Gross per 24 hour  Intake 480 ml  Output --  Net 480 ml        Physical Exam: Vital Signs Blood pressure 129/73, pulse (!) 106, temperature 98.4 F (36.9 C), temperature source Oral, resp. rate 18, height '5\' 7"'$  (1.702 m), weight 124.2 kg, SpO2 94 %. Constitutional: No distress . Vital signs reviewed. HEENT: NCAT, EOMI, oral membranes moist Neck: supple Cardiovascular: Tachycardia Respiratory/Chest: CTA Bilaterally without wheezes or rales. Normal effort    GI/Abdomen: BS +, non-tender, non-distended Ext: no clubbing, cyanosis, or edema Psych: pleasant and cooperative  Skin: NSL Right wrist. Healing abrasion/wound right forearm, sl tender. Rash on bilateral arms and bacl Neuro: right central 7, ongoing word finding deficits. Speech non-fluent but able to speak in phrases and short sentences with extra time.  Reflexes are 2+ in all 4's. Fine motor coordination is intact. No tremors. Motor function is grossly 5/5 LUE and LLE. RUE 3+ to 4- prox to 1-2/5 at wrist and HI. RLE: 3-4/5 prox to distal. Sensation remains sl decreased to LT/PP RUE and RLE Musculoskeletal: tenderness with ROM of right wrist. No edema   Assessment/Plan: 1. Functional deficits which require 3+ hours per day of interdisciplinary  therapy in a comprehensive inpatient rehab setting. Physiatrist is providing close team supervision and 24 hour management of active medical problems listed below. Physiatrist and rehab team continue to assess barriers to discharge/monitor patient progress toward functional and medical goals  Care Tool:  Bathing    Body parts bathed by patient: Right arm, Left arm, Chest, Abdomen, Front perineal area, Right upper leg, Left upper leg, Face   Body parts bathed by helper: Buttocks, Right lower leg, Left lower leg Body parts n/a: Buttocks   Bathing assist Assist Level: Minimal Assistance - Patient > 75%     Upper Body Dressing/Undressing Upper body dressing   What is the patient wearing?: Pull over shirt, Bra    Upper body assist Assist Level: Moderate Assistance - Patient 50 - 74% (S with shirt, mod with bra)    Lower Body Dressing/Undressing Lower body dressing      What is the patient wearing?: Incontinence brief, Pants     Lower body assist Assist for lower body dressing: Maximal Assistance - Patient 25 - 49%     Toileting Toileting    Toileting assist Assist for toileting: Moderate Assistance - Patient 50 - 74%     Transfers Chair/bed transfer  Transfers assist     Chair/bed transfer assist level: Contact Guard/Touching assist     Locomotion  Ambulation   Ambulation assist      Assist level: Minimal Assistance - Patient > 75% Assistive device: Cane-quad Max distance: 60   Walk 10 feet activity   Assist     Assist level: Minimal Assistance - Patient > 75% Assistive device: Cane-quad   Walk 50 feet activity   Assist Walk 50 feet with 2 turns activity did not occur: Safety/medical concerns  Assist level: Contact Guard/Touching assist Assistive device: Cane-quad    Walk 150 feet activity   Assist Walk 150 feet activity did not occur: Safety/medical concerns (fatigue)         Walk 10 feet on uneven surface  activity   Assist Walk 10  feet on uneven surfaces activity did not occur: Safety/medical concerns         Wheelchair     Assist Will patient use wheelchair at discharge?: No   Wheelchair activity did not occur: N/A         Wheelchair 50 feet with 2 turns activity    Assist    Wheelchair 50 feet with 2 turns activity did not occur: N/A       Wheelchair 150 feet activity     Assist  Wheelchair 150 feet activity did not occur: N/A       Blood pressure 129/73, pulse (!) 106, temperature 98.4 F (36.9 C), temperature source Oral, resp. rate 18, height '5\' 7"'$  (1.702 m), weight 124.2 kg, SpO2 94 %.  Medical Problem List and Plan: 1.  Right side weakness and aphasia secondary to left MCA infarct due to left MCA occlusion status post thrombectomy with TIC12c etiology likely due to newly diagnosed atrial fibrillation             -patient may  shower             -ELOS/Goals: 14-16 days- supervision  -Continue CIR therapies including PT, OT, and SLP   2.  New onset atrial fibrillation: continue Eliquis, Amiodarone 200 mg daily, Lanoxin 0.125 mg daily, Cardizem 240 mg daily, increase Lopressor to '50mg'$  TID 3. Hand joint OA: voltaren gel ordered with improvement. Placed nursing order to indicate that patient may use her home tart cherry supplement three times per day- we were unable to get this back from pharmacy- family will bring in new bottle and discussed with nursing to leave this at her bedside  -Also reviewed the following additional supplement which can be helpful for her OA: OMEGA 3 FATTY ACIDS, TURMERIC, GINGER,  CELERY SEED, GLUCOSAMINE WITH CHONDROITIN   4. Mood: Provide emotional support             -antipsychotic agents: N/A 5. Neuropsych: This patient is not ? capable of making decisions on her own behalf. 6. Skin/Wound Care: Local measures as needed RUE.  7. Fluids/Electrolytes/Nutrition: encourage appropriate PO   .   8.  New onset atrial fibrillation.   9.  Hypothyroidism.   Continue Synthroid 10.  Dysphagia.  Dysphagia #2 thin liquids.  adv per SLP, watch for pocketing 11.  Hyperlipidemia.  Continue Crestor 12.  Morbid obesity.  BMI 45.75.  Dietary follow-up. Discontinue Ensure 13.  AKI/prerenal.  BUN/Cr slightly more elevated today -encourage PO fluids  -labs look better today 8/1---dc IVF at HS 14. HTN: increase lopressor to '50mg'$  TID, continue to monitor for effects of yesterday's increase 15. Prediabetes HgbA1c 6.2- d/c ensure. Schedule for outpatient Qutenza for diabetic peripheral neuropathy. Provided dietary education 17. Wrist drop: resting hand splint ordered.  18. Insomnia: related  to right hand OA-   '300mg'$  Gabapentin HS- helped 19. Tachycardia: increase lopressor to 62.5 TID 20. Rash bilateral arms and back: discussed using hypoallergenic sheets with nursing, ordered hydrocortisone cream. Medications reviewed- none new 21. Polypharamcy: all medications reviewed 8/8.        LOS: 11 days A FACE TO FACE EVALUATION WAS PERFORMED  Martha Clan P Drisana Schweickert 12/20/2020, 1:42 PM

## 2020-12-20 NOTE — Progress Notes (Signed)
Speech Language Pathology Daily Session Note  Patient Details  Name: Candice Perez MRN: [id number] Date of Birth: 07-04-1950  Today's Date: 12/20/2020 SLP Individual Time: [id number] SLP Individual Time Calculation (min): 57 min  Short Term Goals: Week 2: SLP Short Term Goal 1 (Week 2): Patient will perform divergent naming task and name at least 10 items corresponding to a given category, with minA. SLP Short Term Goal 2 (Week 2): Patient will tolerate trials of Dys 3 solids without significant oral residuals, with minA cues. SLP Short Term Goal 3 (Week 2): Patient will describe object function/features with minA cues. SLP Short Term Goal 4 (Week 2): Patient will participate in more in depth assessment of her phoneme production errors. SLP Short Term Goal 5 (Week 2): Patient will imitate to produce errored phonemes (/s, z, f, "th", l/) with modA cues. SLP Short Term Goal 6 (Week 2): Patient will answer comprehension questions after reading 2-4 sentence paragraph/text with 80% accuracy and minA.  Skilled Therapeutic Interventions: Skilled ST services focused on speech skills. SLP facilitated articulation placement of initial 1-2 syllable /s/, /z/, /f/, /th/ and /l/ words with difficulty producing /s/ blends and /z/ speech sounds. Pt demonstrated reading comprehension at 2 sentence level, min A verbal cues for error awareness, mod A verbal cues for decoding and min A verbal cues for reading comprehension given f/u questions. Pt was left in room with call bell within reach and bed alarm set. SLP recommends to continue skilled services.      Pain Pain Assessment Pain Score: 0-No pain  Therapy/Group: Individual Therapy  Shamyah Stantz  [facility] 12/20/2020, 3:52 PM

## 2020-12-20 NOTE — Progress Notes (Signed)
Occupational Therapy Session Note  Patient Details  Name: TAMMATHA PRIMAVERA MRN: [id number] Date of Birth: [birth year]  Today's Date: 12/20/2020 OT Individual Time: 0950-1020 OT Individual Time Calculation (min): 30 min    Short Term Goals: Week 2:  OT Short Term Goal 1 (Week 2): Pt will complete toilet transfers with Supervision. OT Short Term Goal 2 (Week 2): Pt will complete toileting with min A. OT Short Term Goal 3 (Week 2): Pt will don pants with min A.  Skilled Therapeutic Interventions/Progress Updates:    Pt semi upright in bed, reports feeling drowsy.  Therapist noting increased redness over right forearm around scabs and also antecubital fossa, back of neck and lower back.  Pt reports no associated pain/additional symptoms.  Nurse made aware and visually assessed during session.  Pt wanting to brush her teeth and wash her face.  Supine to sit using bed rails with CGA.  Sit to stand attempted using quad cane however upon standing up with CGA, pt reports feeling light headed and needing to sit back down.  Vitals assessed: BP 141/90 pulse 99 seated; BP 94/64 standing; returned to sitting 139/67. Nurse made aware of symptomatic orthostatic hypotension.  Pt requesting to lay back down due to light headedness and lethargy. Sit to supine with min assist.  Call bell in reach, bed alarm on. 15 minutes of missed treatment.  Therapy Documentation Precautions:  Precautions Precautions: Fall Restrictions Weight Bearing Restrictions: No Other Position/Activity Restrictions: Right Hemiplegia    Therapy/Group: Individual Therapy  Ezekiel Slocumb 12/20/2020, 8:50 AM

## 2020-12-21 MED ORDER — DM-GUAIFENESIN ER 30-600 MG PO TB12
1.0000 | ORAL_TABLET | Freq: Two times a day (BID) | ORAL | Status: DC
  Administered 2020-12-21 – 2020-12-30 (×18): 1 via ORAL
  Filled 2020-12-21 (×18): qty 1

## 2020-12-21 NOTE — Progress Notes (Signed)
Patient ID: Candice Perez, female   DOB: 08/07/1950, 70 y.o.   MRN: [id number] Met with pt to update regarding team conference goals of CGA with some min assist. Discussed having husband come in for education at then end of the week. Pt will talk with husband and get back with worker tomorrow. Aware target discharge date is 8/16. Will work on discharge needs and follow up. 

## 2020-12-21 NOTE — Progress Notes (Signed)
Physical Therapy Session Note  Patient Details  Name: Candice Perez MRN: [id number] Date of Birth: Mar 16, 1951  Today's Date: 12/21/2020 PT Individual Time: 1440-1525 PT Individual Time Calculation (min): 45 min   Short Term Goals: Week 2:  PT Short Term Goal 1 (Week 2): Pt will complete bed mobility mod I PT Short Term Goal 2 (Week 2): Pt will complete bed<>chair transfers consistently with CGA and LRAD PT Short Term Goal 3 (Week 2): Pt will ambulate 190f with CGA and LRAD  Skilled Therapeutic Interventions/Progress Updates:     Pt supine in bed to start session and agreeable to therapy without reports of pain. Reports feeling better today compared to yesterday but continues to have a rash on her neck and arms (team aware). Pt completed supine<>sit with supervision with use of bed features. Donned tennis shoes with totalA for time. Completed stand<>pivot transfer with CGA and no AD to w/c and then wheeled downstairs to main rehab gym. Completed gait training where she ambulated ~868f+ ~8515f ~68f82f~68ft95fh CGA and WBQC on level surfaces - cues for increased forward gaze and attempting to progress from step-to pattern to step through pattern. Pt needed extended seated rest break b/w sets due to generalized fatigue. Next, worked on stairIT trainere she navigated up/down 8 + 8 smaller 3inch steps with CGA and 2 hand rails - self selected step-to pattern with appropriate understanding of safety with sequencing. She only needed cues for monitoring R hand along hand rail due to sensory deficits. Pt returned upstairs to her room and requested assistance to use the bathroom. NT notified and handoff of care at end of session. All needs met.   Therapy Documentation Precautions:  Precautions Precautions: Fall Restrictions Weight Bearing Restrictions: No Other Position/Activity Restrictions: Right Hemiplegia General:    Therapy/Group: Individual Therapy  ChrisAlger Simons2022,  7:42 AM

## 2020-12-21 NOTE — Progress Notes (Signed)
Occupational Therapy Session Note  Patient Details  Name: Candice Perez MRN: [id number] Date of Birth: 11-22-1950  Today's Date: 12/21/2020 OT Individual Time: [id number] OT Individual Time Calculation (min): 60 min    Short Term Goals: Week 2:  OT Short Term Goal 1 (Week 2): Pt will complete toilet transfers with Supervision. OT Short Term Goal 2 (Week 2): Pt will complete toileting with min A. OT Short Term Goal 3 (Week 2): Pt will don pants with min A.  Skilled Therapeutic Interventions/Progress Updates:    Pt received in bed stating she felt much better today and was not dizzy. Pt agreeable to a shower. ADL Retraining: ADL Eating: Supervision/safety Grooming: Setup Where Assessed-Grooming: Standing at sink Upper Body Bathing: Supervision/safety Where Assessed-Upper Body Bathing: Shower Lower Body Bathing: Minimal assistance Where Assessed-Lower Body Bathing: Shower Upper Body Dressing: Minimal assistance Where Assessed-Upper Body Dressing: Chair Lower Body Dressing: Moderate assistance Where Assessed-Lower Body Dressing: Chair Toileting: Moderate assistance Where Assessed-Toileting: Toilet   Transfers: Toilet Transfer: Minimal assistance, Therapist, music Method: Counselling psychologist: Energy manager: Environmental education officer Method: Heritage manager: Grab bars, Transfer tub bench Balance: CGA in standing with cues to wt shift to L at times when pt began to shift too far to R with adjusting pants over hips Neuromuscular Re-Education:  Pt did well with attempts to use R hand - she focused on gripping washcloth to wash L arm but would probably have a much easier time with a wash mit.  Focused on safe R foot placement with sit to stands and standing balance. Did well with stepping over wide shower stall ledge with min A focusing on spacing of feet to allow for enough room for feet to step  next to each other.   Informed tech, that we did not have time to do oral care.pt resting in recliner with belt alarm on.    Therapy Documentation Precautions:  Precautions Precautions: Fall Restrictions Weight Bearing Restrictions: No Other Position/Activity Restrictions: Right Hemiplegia   Pain: Pain Assessment Pain Scale: 0-10 Pain Score: 2  Pain Type: Acute pain Pain Location: Hand Pain Orientation: Right Pain Descriptors / Indicators: Aching Pain Frequency: Intermittent Pain Intervention(s): Medication (See eMAR)   Therapy/Group: Individual Therapy  Kenton Vale 12/21/2020, 9:30 AM

## 2020-12-21 NOTE — Patient Care Conference (Signed)
Inpatient RehabilitationTeam Conference and Plan of Care Update Date: 12/21/2020   Time: 13:08 PM    Patient Name: Candice Perez      Medical Record Number: [id number]  Date of Birth: [birth year] Sex: Female         Room/Bed: 63C09C/5C09C-01 Payor Info: Payor: [employer] MEDICARE / Plan: [place] MEDICARE HMO/PPO / Product Type: *No Product type* /    Admit Date/Time:  12/09/2020  4:01 PM  Primary Diagnosis:  Left middle cerebral artery stroke [facility])  Hospital Problems: Principal Problem:   Left middle cerebral artery stroke [facility]) Active Problems:   Right hemiplegia [facility])    Expected Discharge Date: Expected Discharge Date: 12/28/20  Team Members Present: Physician leading conference: Dr. Leeroy Cha Social Worker Present: Ovidio Kin, LCSW Nurse Present: Dorien Chihuahua, RN PT Present: Ginnie Smart, PT OT Present: Leretha Pol, OT SLP Present: Charolett Bumpers, SLP PPS Coordinator present : Gunnar Fusi, SLP     Current Status/Progress Goal Weekly Team Focus  Bowel/Bladder   Incontinent of Bowel and Bladder. LBM 12/20/20  Regain Continence.      Swallow/Nutrition/ Hydration   Diet advanced to Dys 3 and thin liquids, sup A  Mod I  Tolerance of diet advancement   ADL's   CGA functional transfers, min-mod assist LB self care, setup-supervision UB self care  CGA  ADL training, neuro re-ed RUE, transfer training   Mobility   minA bed mobility, gait ~175f with WBQC with CGA, stand<>pivot transfers with CGA.  supervision  Gait training, global strengthening and endurance, Functional transfers, DC planning   Communication   mod A  sup-to-min A  Speech production, awareness and repair of phonemic errors   Safety/Cognition/ Behavioral Observations  Min A  Sup A  short-term recall, emergent awareness   Pain   Denies Pain  Remain Pain free      Skin   Rashes @ Back and Forearm. Hydrocortisone cream prn  Promote skin integrity. No New breakdowns.        Discharge Planning:   Husband here daily to provide support will be her caregiver at discharge. ordered wide drop-arm commode-delivering to home   Team Discussion: Rash to arm and back improved with use of hypoallergenic sheets and cream. Right hand arthritic pain improved with medication. Addressed prediabetes and dietary modification recommendations.   Patient on target to meet rehab goals: yes, currently requires supervision for upper body care and min - mod assist for lower body care. Requires CGA - min assist for transfers but balance has improved and now able to ambulate 50-100' with CGA using a LBQC.  *See Care Plan and progress notes for long and short-term goals.   Revisions to Treatment Plan:  Regular consistency diet trials Working on reading comprehension, and sentence level comprehension with recall and error awareness  Teaching Needs: Medications, secondary stroke risk management, safety, etc.  Current Barriers to Discharge: Decreased caregiver support, Home enviroment access/layout, and New diabetic  Possible Resolutions to Barriers: Family education     Medical Summary Current Status: Rash on her arms and her back, numbness in bilateral feet, right hand arthritic pain, insomnia, prediabetes, phlegm  Barriers to Discharge: Medical stability  Barriers to Discharge Comments: Rash on her arms and her back, numbness in bilateral feet, right hand arthritic pain, insomnia, prediabetes, phlegm Possible Resolutions to BCelanese CorporationFocus: hydrocortisone cream ordered, discussed outpatient Qutenza application, continue voltaren gel, continue to monitor CBGs and provided education regarding lifetsyle modification   Continued Need for Acute Rehabilitation Level of  Care: The patient requires daily medical management by a physician with specialized training in physical medicine and rehabilitation for the following reasons: Direction of a multidisciplinary physical rehabilitation program to maximize  functional independence : Yes Medical management of patient stability for increased activity during participation in an intensive rehabilitation regime.: Yes Analysis of laboratory values and/or radiology reports with any subsequent need for medication adjustment and/or medical intervention. : Yes   I attest that I was present, lead the team conference, and concur with the assessment and plan of the team.   Dorien Chihuahua B 12/21/2020, 3:00 PM

## 2020-12-21 NOTE — Progress Notes (Signed)
PROGRESS NOTE   Subjective/Complaints: Feeling much better today Rash has improved Continues to have numbness in bilateral feet Continues to have right hand arthritic pain  ROS: Patient denies fever, rash, sore throat, blurred vision, nausea, vomiting, diarrhea, cough, shortness of breath or chest pain, headache, or mood change.    Objective:   No results found. No results for input(s): WBC, HGB, HCT, PLT in the last 72 hours.  No results for input(s): NA, K, CL, CO2, GLUCOSE, BUN, CREATININE, CALCIUM in the last 72 hours.   Intake/Output Summary (Last 24 hours) at 12/21/2020 1313 Last data filed at 12/20/2020 1700 Gross per 24 hour  Intake 480 ml  Output --  Net 480 ml        Physical Exam: Vital Signs Blood pressure 110/68, pulse 61, temperature 97.7 F (36.5 C), temperature source Oral, resp. rate 18, height '5\' 7"'$  (1.702 m), weight 123.9 kg, SpO2 92 %. Gen: no distress, normal appearing HEENT: oral mucosa pink and moist, NCAT Cardio: Reg rate Chest: normal effort, normal rate of breathing Abd: soft, non-distended Ext: no edema Psych: pleasant, normal affect  Skin: NSL Right wrist. Healing abrasion/wound right forearm, sl tender. Rash on bilateral arms and bacl Neuro: right central 7, ongoing word finding deficits. Speech non-fluent but able to speak in phrases and short sentences with extra time.  Reflexes are 2+ in all 4's. Fine motor coordination is intact. No tremors. Motor function is grossly 5/5 LUE and LLE. RUE 3+ to 4- prox to 1-2/5 at wrist and HI. RLE: 3-4/5 prox to distal. Sensation remains sl decreased to LT/PP RUE and RLE Musculoskeletal: tenderness with ROM of right wrist. No edema   Assessment/Plan: 1. Functional deficits which require 3+ hours per day of interdisciplinary therapy in a comprehensive inpatient rehab setting. Physiatrist is providing close team supervision and 24 hour management of  active medical problems listed below. Physiatrist and rehab team continue to assess barriers to discharge/monitor patient progress toward functional and medical goals  Care Tool:  Bathing    Body parts bathed by patient: Right arm, Left arm, Chest, Abdomen, Front perineal area, Right upper leg, Left upper leg, Face, Buttocks   Body parts bathed by helper: Right lower leg, Left lower leg Body parts n/a: Buttocks   Bathing assist Assist Level: Minimal Assistance - Patient > 75%     Upper Body Dressing/Undressing Upper body dressing   What is the patient wearing?: Pull over shirt, Bra    Upper body assist Assist Level: Moderate Assistance - Patient 50 - 74% (min shirt, mod bra)    Lower Body Dressing/Undressing Lower body dressing      What is the patient wearing?: Incontinence brief, Pants     Lower body assist Assist for lower body dressing: Moderate Assistance - Patient 50 - 74%     Toileting Toileting    Toileting assist Assist for toileting: Moderate Assistance - Patient 50 - 74%     Transfers Chair/bed transfer  Transfers assist     Chair/bed transfer assist level: Contact Guard/Touching assist     Locomotion Ambulation   Ambulation assist      Assist level: Minimal Assistance - Patient >  75% Assistive device: Cane-quad Max distance: 60   Walk 10 feet activity   Assist     Assist level: Minimal Assistance - Patient > 75% Assistive device: Cane-quad   Walk 50 feet activity   Assist Walk 50 feet with 2 turns activity did not occur: Safety/medical concerns  Assist level: Contact Guard/Touching assist Assistive device: Cane-quad    Walk 150 feet activity   Assist Walk 150 feet activity did not occur: Safety/medical concerns (fatigue)         Walk 10 feet on uneven surface  activity   Assist Walk 10 feet on uneven surfaces activity did not occur: Safety/medical concerns         Wheelchair     Assist Will patient use  wheelchair at discharge?: No   Wheelchair activity did not occur: N/A         Wheelchair 50 feet with 2 turns activity    Assist    Wheelchair 50 feet with 2 turns activity did not occur: N/A       Wheelchair 150 feet activity     Assist  Wheelchair 150 feet activity did not occur: N/A       Blood pressure 110/68, pulse 61, temperature 97.7 F (36.5 C), temperature source Oral, resp. rate 18, height '5\' 7"'$  (1.702 m), weight 123.9 kg, SpO2 92 %.  Medical Problem List and Plan: 1.  Right side weakness and aphasia secondary to left MCA infarct due to left MCA occlusion status post thrombectomy with TIC12c etiology likely due to newly diagnosed atrial fibrillation             -patient may  shower             -ELOS/Goals: 14-16 days- supervision  -Continue CIR therapies including PT, OT, and SLP    -Interdisciplinary Team Conference today   2.  New onset atrial fibrillation: continue Eliquis, Amiodarone 200 mg daily, Lanoxin 0.125 mg daily, Cardizem 240 mg daily, increase Lopressor to '50mg'$  TID 3. Hand joint OA: voltaren gel ordered with improvement. Placed nursing order to indicate that patient may use her home tart cherry supplement three times per day- we were unable to get this back from pharmacy- family will bring in new bottle and discussed with nursing to leave this at her bedside  -Also reviewed the following additional supplement which can be helpful for her OA: OMEGA 3 FATTY ACIDS, TURMERIC, GINGER,  CELERY SEED, GLUCOSAMINE WITH CHONDROITIN   4. Mood: Provide emotional support             -antipsychotic agents: N/A 5. Neuropsych: This patient is not ? capable of making decisions on her own behalf. 6. Skin/Wound Care: Local measures as needed RUE.  7. Fluids/Electrolytes/Nutrition: encourage appropriate PO   .   8.  New onset atrial fibrillation.   9.  Hypothyroidism.  Continue Synthroid 10.  Dysphagia.  Dysphagia #2 thin liquids.  adv per SLP, watch for  pocketing 11.  Hyperlipidemia.  Continue Crestor 12.  Morbid obesity.  BMI 45.75.  Dietary follow-up. Discontinue Ensure 13.  AKI/prerenal.   -encourage PO fluids  -labs look better  14. HTN: increase lopressor to '50mg'$  TID, continue to monitor for effects of yesterday's increase 15. Prediabetes HgbA1c 6.2- d/c ensure. Schedule for outpatient Qutenza for diabetic peripheral neuropathy. Provided dietary education 17. Wrist drop: resting hand splint ordered.  18. Insomnia: related to right hand OA-   '300mg'$  Gabapentin HS- helped 19. Tachycardia: continue lopressor to 62.5 TID, improved 20.  Rash bilateral arms and back: discussed using hypoallergenic sheets with nursing, ordered hydrocortisone cream. Medications reviewed- none new 21. Polypharamcy: all medications reviewed 8/8.  22. Aphasia: continue working on reading comprehension, expressive aphasia with SLP 23. Phelgm production: mucinex started.        LOS: 12 days A FACE TO FACE EVALUATION WAS PERFORMED  Vaiden Adames P Malavika Lira 12/21/2020, 1:13 PM

## 2020-12-21 NOTE — Progress Notes (Signed)
Speech Language Pathology Daily Session Note  Patient Details  Name: Candice Perez MRN: [id number] Date of Birth: [birth year]  Today's Date: 12/21/2020 SLP Individual Time: 1020-1100 SLP Individual Time Calculation (min): 40 min  Short Term Goals: Week 2: SLP Short Term Goal 1 (Week 2): Patient will perform divergent naming task and name at least 10 items corresponding to a given category, with minA. SLP Short Term Goal 2 (Week 2): Patient will tolerate trials of Dys 3 solids without significant oral residuals, with minA cues. SLP Short Term Goal 3 (Week 2): Patient will describe object function/features with minA cues. SLP Short Term Goal 4 (Week 2): Patient will participate in more in depth assessment of her phoneme production errors. SLP Short Term Goal 5 (Week 2): Patient will imitate to produce errored phonemes (/s, z, f, "th", l/) with modA cues. SLP Short Term Goal 6 (Week 2): Patient will answer comprehension questions after reading 2-4 sentence paragraph/text with 80% accuracy and minA.  Skilled Therapeutic Interventions:Skilled ST services focused on language skills. SLP facilitated reading fluency and comprehension utilizing RCBA. Pt initially required mod A verbal cues to correct decoding errors during reading impacting her reading comprehension in selecting appropriate picture, however pt was able to demonstrate good carryover of correcting decoding errors and summarizing thoughts within a sentence prior to advancement in the sentences, fading to min A verbal cues for 100% accuracy. SLP provided education how decoding/fluency impacts reading comprehension and pt was able to restate in own words how to aid in reading comprehension, with error awareness, re-reading and summarization/visualization.  Pt was left in room with call bell within reach and chair alarm set. SLP recommends to continue skilled services.     Pain Pain Assessment Pain Scale: 0-10 Pain Score: 0-No pain Pain  Type: Acute pain Pain Location: Hand Pain Orientation: Right Pain Descriptors / Indicators: Aching Pain Frequency: Intermittent Pain Intervention(s): Medication (See eMAR)  Therapy/Group: Individual Therapy  Jennifermarie Franzen  [facility] 12/21/2020, 11:06 AM

## 2020-12-22 MED ORDER — GABAPENTIN 400 MG PO CAPS
400.0000 mg | ORAL_CAPSULE | Freq: Every day | ORAL | Status: DC
  Administered 2020-12-22 – 2020-12-29 (×8): 400 mg via ORAL
  Filled 2020-12-22 (×8): qty 1

## 2020-12-22 NOTE — Progress Notes (Signed)
PROGRESS NOTE   Subjective/Complaints: Feeling very tired this morning. Did not sleep well last night. Discussed increasing her gabapentin dose to '400mg'$   ROS: Patient denies fever, rash, sore throat, blurred vision, nausea, vomiting, diarrhea, cough, shortness of breath or chest pain, headache, or mood change.    Objective:   No results found. No results for input(s): WBC, HGB, HCT, PLT in the last 72 hours.  No results for input(s): NA, K, CL, CO2, GLUCOSE, BUN, CREATININE, CALCIUM in the last 72 hours.   Intake/Output Summary (Last 24 hours) at 12/22/2020 1239 Last data filed at 12/22/2020 0800 Gross per 24 hour  Intake 414 ml  Output --  Net 414 ml        Physical Exam: Vital Signs Blood pressure 112/60, pulse 70, temperature 97.6 F (36.4 C), temperature source Oral, resp. rate 17, height '5\' 7"'$  (1.702 m), weight 123.7 kg, SpO2 92 %. Gen: no distress, normal appearing HEENT: oral mucosa pink and moist, NCAT Cardio: Reg rate Chest: normal effort, normal rate of breathing Abd: soft, non-distended Ext: no edema Psych: pleasant, normal affect   Skin: NSL Right wrist. Healing abrasion/wound right forearm, sl tender. Rash on bilateral arms and bacl Neuro: right central 7, ongoing word finding deficits. Speech non-fluent but able to speak in phrases and short sentences with extra time.  Reflexes are 2+ in all 4's. Fine motor coordination is intact. No tremors. Motor function is grossly 5/5 LUE and LLE. RUE 3+ to 4- prox to 1-2/5 at wrist and HI. RLE: 3-4/5 prox to distal. Sensation remains sl decreased to LT/PP RUE and RLE Musculoskeletal: tenderness with ROM of right wrist. No edema   Assessment/Plan: 1. Functional deficits which require 3+ hours per day of interdisciplinary therapy in a comprehensive inpatient rehab setting. Physiatrist is providing close team supervision and 24 hour management of active medical  problems listed below. Physiatrist and rehab team continue to assess barriers to discharge/monitor patient progress toward functional and medical goals  Care Tool:  Bathing    Body parts bathed by patient: Right arm, Left arm, Chest, Abdomen, Front perineal area, Right upper leg, Left upper leg, Face, Buttocks   Body parts bathed by helper: Right lower leg, Left lower leg Body parts n/a: Buttocks   Bathing assist Assist Level: Minimal Assistance - Patient > 75%     Upper Body Dressing/Undressing Upper body dressing   What is the patient wearing?: Pull over shirt, Bra    Upper body assist Assist Level: Moderate Assistance - Patient 50 - 74% (min shirt, mod bra)    Lower Body Dressing/Undressing Lower body dressing      What is the patient wearing?: Incontinence brief, Pants     Lower body assist Assist for lower body dressing: Moderate Assistance - Patient 50 - 74%     Toileting Toileting    Toileting assist Assist for toileting: Moderate Assistance - Patient 50 - 74%     Transfers Chair/bed transfer  Transfers assist     Chair/bed transfer assist level: Contact Guard/Touching assist     Locomotion Ambulation   Ambulation assist      Assist level: Contact Guard/Touching assist Assistive device: Cane-quad Max  distance: 58'   Walk 10 feet activity   Assist     Assist level: Contact Guard/Touching assist Assistive device: Cane-quad   Walk 50 feet activity   Assist Walk 50 feet with 2 turns activity did not occur: Safety/medical concerns  Assist level: Contact Guard/Touching assist Assistive device: Cane-quad    Walk 150 feet activity   Assist Walk 150 feet activity did not occur: Safety/medical concerns         Walk 10 feet on uneven surface  activity   Assist Walk 10 feet on uneven surfaces activity did not occur: Safety/medical concerns         Wheelchair     Assist Will patient use wheelchair at discharge?: No    Wheelchair activity did not occur: N/A         Wheelchair 50 feet with 2 turns activity    Assist    Wheelchair 50 feet with 2 turns activity did not occur: N/A       Wheelchair 150 feet activity     Assist  Wheelchair 150 feet activity did not occur: N/A       Blood pressure 112/60, pulse 70, temperature 97.6 F (36.4 C), temperature source Oral, resp. rate 17, height '5\' 7"'$  (1.702 m), weight 123.7 kg, SpO2 92 %.  Medical Problem List and Plan: 1.  Right side weakness and aphasia secondary to left MCA infarct due to left MCA occlusion status post thrombectomy with TIC12c etiology likely due to newly diagnosed atrial fibrillation             -patient may  shower             -ELOS/Goals: 14-16 days- supervision  -Continue CIR therapies including PT, OT, and SLP    2.  New onset atrial fibrillation: continue Eliquis, Amiodarone 200 mg daily, Lanoxin 0.125 mg daily, Cardizem 240 mg daily, increase Lopressor to '50mg'$  TID 3. Hand joint OA: voltaren gel ordered with improvement. Placed nursing order to indicate that patient may use her home tart cherry supplement three times per day- we were unable to get this back from pharmacy- family will bring in new bottle and discussed with nursing to leave this at her bedside  -Also reviewed the following additional supplement which can be helpful for her OA: OMEGA 3 FATTY ACIDS, TURMERIC, GINGER,  CELERY SEED, GLUCOSAMINE WITH CHONDROITIN   4. Mood: Provide emotional support             -antipsychotic agents: N/A 5. Neuropsych: This patient is not ? capable of making decisions on her own behalf. 6. Skin/Wound Care: Local measures as needed RUE.  7. Fluids/Electrolytes/Nutrition: encourage appropriate PO   .   8.  New onset atrial fibrillation.   9.  Hypothyroidism.  Continue Synthroid 10.  Dysphagia.  Dysphagia #2 thin liquids.  adv per SLP, watch for pocketing 11.  Hyperlipidemia.  Continue Crestor 12.  Morbid obesity.  BMI 45.75.   Dietary follow-up. Discontinue Ensure 13.  AKI/prerenal.   -encourage PO fluids  -labs look better  14. HTN: increase lopressor to '50mg'$  TID, continue to monitor for effects of yesterday's increase 15. Prediabetes HgbA1c 6.2- d/c ensure. Schedule for outpatient Qutenza for diabetic peripheral neuropathy. Provided dietary education 17. Wrist drop: resting hand splint ordered.  18. Insomnia: related to right hand OA-   '300mg'$  Gabapentin HS- helped 19. Tachycardia: continue lopressor to 62.5 TID, improved 20. Rash bilateral arms and back: discussed using hypoallergenic sheets with nursing, ordered hydrocortisone cream. Medications  reviewed- none new 21. Polypharamcy: all medications reviewed 8/8.  22. Aphasia: continue working on reading comprehension, expressive aphasia with SLP 23. Phelgm production: mucinex started.        LOS: 13 days A FACE TO FACE EVALUATION WAS PERFORMED  Candice Perez 12/22/2020, 12:39 PM

## 2020-12-22 NOTE — Progress Notes (Signed)
Physical Therapy Session Note  Patient Details  Name: Candice Perez MRN: [id number] Date of Birth: 1951/03/16   Pt refusing this morning's PT session. Reports a poor nights rest and R hand pain that she's waiting on medicine from her RN. Attempted to encourage pt to mobilize OOB with light activity but she continued to politely defer. She missed 60 minutes of skilled therapy. Will re-attempt as schedule and pt availability permits.   Teresa Nicodemus P Cait Locust PT 12/22/2020, 7:38 AM

## 2020-12-22 NOTE — Progress Notes (Signed)
Physical Therapy Session Note  Patient Details  Name: Candice Perez MRN: [id number] Date of Birth: July 18, 1950  Today's Date: 12/22/2020 PT Missed Time: 45 Minutes Missed Time Reason: Patient fatigue;Patient unwilling to participate  Pt received supported in bed eating lunch. Pt reports she is not having a good day, stating she is "sleepy" due to not getting any rest last night. Pt states MD aware and has adjusted medications accordingly. Therapist offered to return in 39mnutes to allow time for pt to complete her meal then participate in therapy; however, pt continues to decline stating she wants to rest. Missed 45 minutes of skilled physical therapy.  CTawana Scale, PT, DPT, NCS, CSRS 12/22/2020, 12:23 PM

## 2020-12-22 NOTE — Progress Notes (Signed)
Patient ID: Candice Perez, female   DOB: 1950-09-14, 70 y.o.   MRN: [id number] Met with pt who is not feeling well today did not sleep last night. MD aware and has prescribed her something for sleep. Her husband can come Friday at 1;00-4:00. Have scheduled him for this time. Hopefully pt will feel better tomorrow. Continue to work on discharge needs.

## 2020-12-22 NOTE — Progress Notes (Addendum)
Occupational Therapy Session Note  Patient Details  Name: Candice Perez MRN: [id number] Date of Birth: 02-Mar-1951  Today's Date: 12/22/2020 OT Missed Time: 44 Minutes Missed Time Reason: Patient fatigue (Pt did not sleep last night and wants to rest)   Pt supine in bed with lights off, reporting she did not sleep at all last night and wants to rest in bed.  Encouraged pt to participate and educated pt on benefits of therapy as well as suggesting to complete session outside to facilitate normal circadian rhythm however pt politely refusing and re-stating she just wants to stay in bed.  Call bell in reach, bed alarm on at OT departure.     Therapy Documentation Precautions:  Precautions Precautions: Fall Restrictions Weight Bearing Restrictions: No Other Position/Activity Restrictions: Right Hemiplegia    Therapy/Group: Individual Therapy  Ezekiel Slocumb 12/22/2020, 3:11 PM

## 2020-12-22 NOTE — Plan of Care (Signed)
Incontinent

## 2020-12-22 NOTE — Progress Notes (Signed)
Speech Language Pathology Daily Session Note  Patient Details  Name: Candice Perez MRN: [id number] Date of Birth: 1951-01-01  Today's Date: 12/22/2020 SLP Individual Time: 1017-1050 SLP Individual Time Calculation (min): 33 min  Short Term Goals: Week 2: SLP Short Term Goal 1 (Week 2): Patient will perform divergent naming task and name at least 10 items corresponding to a given category, with minA. SLP Short Term Goal 2 (Week 2): Patient will tolerate trials of Dys 3 solids without significant oral residuals, with minA cues. SLP Short Term Goal 3 (Week 2): Patient will describe object function/features with minA cues. SLP Short Term Goal 4 (Week 2): Patient will participate in more in depth assessment of her phoneme production errors. SLP Short Term Goal 5 (Week 2): Patient will imitate to produce errored phonemes (/s, z, f, "th", l/) with modA cues. SLP Short Term Goal 6 (Week 2): Patient will answer comprehension questions after reading 2-4 sentence paragraph/text with 80% accuracy and minA.  Skilled Therapeutic Interventions:Skilled ST services focused on language skills. Pt expressed fatigue due to lack of sleep in which medical staff has already been notified. SLP facilitated object function and features photographs, pt was able to name object function in 7 out 7 opportunities, however required mod A verbal cues to list object features as well as mod A verbal cues to correct phonetic errors. Pt was able to name 6 animals within 1 minute time frame, given subcategories and mod A verbal cues to move to subcategories when word finding deficits occurred, pt was only able to name 4 within 1 minute. Pt requested to end services due to fatigue and missed 10 minutes of treatment. Pt was left in room with call bell within reach and bed alarm set. SLP recommends to continue skilled services.     Pain Pain Assessment Pain Scale: 0-10 Pain Score: 3  Pain Type: Acute pain Pain Location:  Hand Pain Orientation: Right Pain Descriptors / Indicators: Aching Pain Frequency: Intermittent Pain Onset: Gradual Patients Stated Pain Goal: 0 Pain Intervention(s): Medication (See eMAR)  Therapy/Group: Individual Therapy  Ovide Dusek  [facility] 12/22/2020, 7:51 AM

## 2020-12-23 MED ORDER — HYDROCORTISONE 1 % EX CREA
TOPICAL_CREAM | Freq: Every day | CUTANEOUS | Status: DC
  Filled 2020-12-23: qty 28

## 2020-12-23 NOTE — Progress Notes (Signed)
Physical Therapy Session Note  Patient Details  Name: Candice Perez MRN: [id number] Date of Birth: February 20, 1951  Today's Date: 12/23/2020 PT Individual Time: 0900-1000 PT Individual Time Calculation (min): 60 min   Short Term Goals: Week 2:  PT Short Term Goal 1 (Week 2): Pt will complete bed mobility mod I PT Short Term Goal 2 (Week 2): Pt will complete bed<>chair transfers consistently with CGA and LRAD PT Short Term Goal 3 (Week 2): Pt will ambulate 1108f with CGA and LRAD  Skilled Therapeutic Interventions/Progress Updates:    Pt on toilet at start of session with NT assisting with posterior pericare. Once completed, hand off of care to PT as pt agreeable to therapy session. Ambulated short distance in room with CGA and [facility] w/c. Wheeled in w/c to sink per pt request to wash hands and brush teeth. She required assist for managing toothpaste (removing cap and applying to toothbrush) but otherwise was able to complete without assist at w/c level. Pt transported downstairs to 38M rehab gym for time. Stand<>pivot transfer to mat table with close supervision and [facility] Sit<>stand with supervision to [facility] ambulated ~876fwith CGA (progressing to supervision for last ~3519fand WBQ[facility] level surfaces. Gait speed grossly decreased with 3-point gait and step-through pattern. Next, completed TUG with WBQ[facility] CGA for safety: Trial 1) 46 seconds Trial 2) 56 seconds Trial 3) 49 seconds AVG = 50.3 seconds  *TUG score >13.5 seconds indicates increased falls risk.  Next, worked on repeated goblet squats from low mat table surface with 1kg med ball where she completed 2x5 reps with setaed rest break b/w sets - stands completed at supervision level.   Stand<>pivot transfer with supervision and WBQC back to her w/c and returned upstairs to her room. Agreeable to remain in her w/c for upcoming SLP session. Safety belt alarm on and all needs met.  Therapy Documentation Precautions:   Precautions Precautions: Fall Restrictions Weight Bearing Restrictions: No Other Position/Activity Restrictions: Right Hemiplegia General:     Therapy/Group: Individual Therapy  ChrAlger Simons11/2022, 7:34 AM

## 2020-12-23 NOTE — Progress Notes (Signed)
Occupational Therapy Session Note  Patient Details  Name: Candice Perez MRN: [id number] Date of Birth: 1950-10-16  Today's Date: 12/23/2020 OT Individual Time: 1110-1205 OT Individual Time Calculation (min): 55 min    Short Term Goals: Week 2:  OT Short Term Goal 1 (Week 2): Pt will complete toilet transfers with Supervision. OT Short Term Goal 2 (Week 2): Pt will complete toileting with min A. OT Short Term Goal 3 (Week 2): Pt will don pants with min A.      Skilled Therapeutic Interventions/Progress Updates:    Pt sitting up in w/c, flat affect, no c/o pain; reports feeling tired.  Also reports she needs cleaning due to incontinence episode of urine.  Pt ambulated to sink using quad cane with close supervision.  Clothing management with min assist to pull pants brief up/down over hips (pt able to manage tabs with step by step instruction).  Pt completed pericare with CGA in standing.  Pt then transported to outside area to increase alertness and mood.  Pt participated in sit<>stand and ambulation task to increase activity tolerance/endurance and standing balance.  Required close supervision to ambulate approximately 50 feet using quad cane.  Transported back to room, call bell in reach,  seat alarm on at end of session.  Therapy Documentation Precautions:  Precautions Precautions: Fall Restrictions Weight Bearing Restrictions: No Other Position/Activity Restrictions: Right Hemiplegia   Therapy/Group: Individual Therapy  Ezekiel Slocumb 12/23/2020, 3:15 PM

## 2020-12-23 NOTE — Progress Notes (Signed)
PROGRESS NOTE   Subjective/Complaints: Feeling much better today Slept better last night Did well with Christian today Continues to have phlegm- incentive spirometer ordered and encouraged OOB to chair  ROS: Patient denies fever, rash, sore throat, blurred vision, nausea, vomiting, diarrhea, cough, shortness of breath or chest pain, headache, or mood change. +phlegm   Objective:   No results found. No results for input(s): WBC, HGB, HCT, PLT in the last 72 hours.  No results for input(s): NA, K, CL, CO2, GLUCOSE, BUN, CREATININE, CALCIUM in the last 72 hours.   Intake/Output Summary (Last 24 hours) at 12/23/2020 1200 Last data filed at 12/23/2020 0720 Gross per 24 hour  Intake 956 ml  Output --  Net 956 ml        Physical Exam: Vital Signs Blood pressure 121/83, pulse 60, temperature 97.6 F (36.4 C), temperature source Oral, resp. rate 17, height '5\' 7"'$  (1.702 m), weight 123.3 kg, SpO2 94 %. Gen: no distress, normal appearing HEENT: oral mucosa pink and moist, NCAT Cardio: Reg rate Chest: normal effort, normal rate of breathing Abd: soft, non-distended Ext: no edema Psych: pleasant, normal affect   Skin: NSL Right wrist. Healing abrasion/wound right forearm, sl tender. Rash on bilateral arms and bacl Neuro: right central 7, ongoing word finding deficits. Speech non-fluent but able to speak in phrases and short sentences with extra time.  Reflexes are 2+ in all 4's. Fine motor coordination is intact. No tremors. Motor function is grossly 5/5 LUE and LLE. RUE 3+ to 4- prox to 1-2/5 at wrist and HI. RLE: 3-4/5 prox to distal. Sensation remains sl decreased to LT/PP RUE and RLE Musculoskeletal: tenderness with ROM of right wrist. No edema   Assessment/Plan: 1. Functional deficits which require 3+ hours per day of interdisciplinary therapy in a comprehensive inpatient rehab setting. Physiatrist is providing close team  supervision and 24 hour management of active medical problems listed below. Physiatrist and rehab team continue to assess barriers to discharge/monitor patient progress toward functional and medical goals  Care Tool:  Bathing    Body parts bathed by patient: Right arm, Left arm, Chest, Abdomen, Front perineal area, Right upper leg, Left upper leg, Face, Buttocks   Body parts bathed by helper: Right lower leg, Left lower leg Body parts n/a: Buttocks   Bathing assist Assist Level: Minimal Assistance - Patient > 75%     Upper Body Dressing/Undressing Upper body dressing   What is the patient wearing?: Pull over shirt, Bra    Upper body assist Assist Level: Moderate Assistance - Patient 50 - 74% (min shirt, mod bra)    Lower Body Dressing/Undressing Lower body dressing      What is the patient wearing?: Incontinence brief, Pants     Lower body assist Assist for lower body dressing: Moderate Assistance - Patient 50 - 74%     Toileting Toileting    Toileting assist Assist for toileting: Moderate Assistance - Patient 50 - 74%     Transfers Chair/bed transfer  Transfers assist     Chair/bed transfer assist level: Contact Guard/Touching assist     Locomotion Ambulation   Ambulation assist      Assist level:  Contact Guard/Touching assist Assistive device: Cane-quad Max distance: 49'   Walk 10 feet activity   Assist     Assist level: Contact Guard/Touching assist Assistive device: Cane-quad   Walk 50 feet activity   Assist Walk 50 feet with 2 turns activity did not occur: Safety/medical concerns  Assist level: Contact Guard/Touching assist Assistive device: Cane-quad    Walk 150 feet activity   Assist Walk 150 feet activity did not occur: Safety/medical concerns         Walk 10 feet on uneven surface  activity   Assist Walk 10 feet on uneven surfaces activity did not occur: Safety/medical concerns         Wheelchair     Assist  Will patient use wheelchair at discharge?: No   Wheelchair activity did not occur: N/A         Wheelchair 50 feet with 2 turns activity    Assist    Wheelchair 50 feet with 2 turns activity did not occur: N/A       Wheelchair 150 feet activity     Assist  Wheelchair 150 feet activity did not occur: N/A       Blood pressure 121/83, pulse 60, temperature 97.6 F (36.4 C), temperature source Oral, resp. rate 17, height '5\' 7"'$  (1.702 m), weight 123.3 kg, SpO2 94 %.  Medical Problem List and Plan: 1.  Right side weakness and aphasia secondary to left MCA infarct due to left MCA occlusion status post thrombectomy with TIC12c etiology likely due to newly diagnosed atrial fibrillation             -patient may  shower             -ELOS/Goals: 14-16 days- supervision  -Continue CIR therapies including PT, OT, and SLP     Check Vitamin D tomorrow morning.  2.  New onset atrial fibrillation: continue Eliquis, Amiodarone 200 mg daily, Lanoxin 0.125 mg daily, Cardizem 240 mg daily, increase Lopressor to '50mg'$  TID. Magnesium level reviewed and stable.  3. Hand joint OA: voltaren gel ordered with improvement. Placed nursing order to indicate that patient may use her home tart cherry supplement three times per day- we were unable to get this back from pharmacy- family will bring in new bottle and discussed with nursing to leave this at her bedside  -Also reviewed the following additional supplement which can be helpful for her OA: OMEGA 3 FATTY ACIDS, TURMERIC, GINGER,  CELERY SEED, GLUCOSAMINE WITH CHONDROITIN   4. Mood: Provide emotional support             -antipsychotic agents: N/A 5. Neuropsych: This patient is not ? capable of making decisions on her own behalf. 6. Skin/Wound Care: Local measures as needed RUE.  7. Fluids/Electrolytes/Nutrition: encourage appropriate PO   .   8.  New onset atrial fibrillation.   9.  Hypothyroidism.  Continue Synthroid 10.  Dysphagia.  Dysphagia  #2 thin liquids.  adv per SLP, watch for pocketing 11.  Hyperlipidemia.  Continue Crestor 12.  Morbid obesity.  BMI 45.75.  Dietary follow-up. Discontinue Ensure 13.  AKI/prerenal.   -encourage PO fluids  -labs look better  14. HTN: increase lopressor to '50mg'$  TID, continue to monitor for effects of yesterday's increase 15. Prediabetes HgbA1c 6.2- d/c ensure. Schedule for outpatient Qutenza for diabetic peripheral neuropathy. Provided dietary education 17. Wrist drop: resting hand splint ordered.  18. Insomnia: related to right hand OA-   '300mg'$  Gabapentin HS- helped 19. Tachycardia: continue lopressor  to 62.5 TID, improved 20. Rash bilateral arms and back: discussed using hypoallergenic sheets with nursing, improved- decrease hydrocortisone to daily. Medications reviewed- none new 21. Polypharamcy: all medications reviewed 8/8.  22. Aphasia: continue working on reading comprehension, expressive aphasia with SLP 23. Phelgm production: mucinex started. Ordered incentive spirometer and out of bed for meals and educated patient regarding benefits of these to prevent atelectasis and pneumonia.        LOS: 14 days A FACE TO FACE EVALUATION WAS PERFORMED  Joice Nazario P Seth Higginbotham 12/23/2020, 12:00 PM

## 2020-12-23 NOTE — Progress Notes (Signed)
Speech Language Pathology Daily Session Note  Patient Details  Name: Candice Perez MRN: [id number] Date of Birth: 1951/05/14  Today's Date: 12/23/2020 SLP Individual Time: 0800-0825 SLP Individual Time Calculation (min): 25 min  Short Term Goals: Week 2: SLP Short Term Goal 1 (Week 2): Patient will perform divergent naming task and name at least 10 items corresponding to a given category, with minA. SLP Short Term Goal 2 (Week 2): Patient will tolerate trials of Dys 3 solids without significant oral residuals, with minA cues. SLP Short Term Goal 3 (Week 2): Patient will describe object function/features with minA cues. SLP Short Term Goal 4 (Week 2): Patient will participate in more in depth assessment of her phoneme production errors. SLP Short Term Goal 5 (Week 2): Patient will imitate to produce errored phonemes (/s, z, f, "th", l/) with modA cues. SLP Short Term Goal 6 (Week 2): Patient will answer comprehension questions after reading 2-4 sentence paragraph/text with 80% accuracy and minA.  Skilled Therapeutic Interventions:   Patient seen for skilled ST session focusing on speech-language goals. She as able to tell SLP that previous date she did not feel well as she had gotten poor sleep but MD has now put her on a different medication which seems to have helped. She was able to name 5 items in a given category without assistance and then benefited from semantic cues for naming more. She continues to exhibit phonological errors however at times she is able to correct. In addition, errors do not appear to be completely consistent. Patient continues to benefit from skilled SLP intervention to maximize speech-language goals prior to discharge.  Pain Pain Assessment Pain Scale: 0-10 Pain Score: 0-No pain  Therapy/Group: Individual Therapy  Sonia Baller, MA, CCC-SLP Speech Therapy

## 2020-12-23 NOTE — Progress Notes (Signed)
Speech Language Pathology Daily Session Note  Patient Details  Name: Candice Perez MRN: [id number] Date of Birth: 1950/07/17  Today's Date: 12/23/2020 SLP Individual Time: 1000-1040 SLP Individual Time Calculation (min): 40 min  Short Term Goals: Week 2: SLP Short Term Goal 1 (Week 2): Patient will perform divergent naming task and name at least 10 items corresponding to a given category, with minA. SLP Short Term Goal 2 (Week 2): Patient will tolerate trials of Dys 3 solids without significant oral residuals, with minA cues. SLP Short Term Goal 3 (Week 2): Patient will describe object function/features with minA cues. SLP Short Term Goal 4 (Week 2): Patient will participate in more in depth assessment of her phoneme production errors. SLP Short Term Goal 5 (Week 2): Patient will imitate to produce errored phonemes (/s, z, f, "th", l/) with modA cues. SLP Short Term Goal 6 (Week 2): Patient will answer comprehension questions after reading 2-4 sentence paragraph/text with 80% accuracy and minA.  Skilled Therapeutic Interventions:   Patient seen for second skilled ST session. She was sitting up in recliner and had just finished PT, currently appearing a little fatigued. She was able to maintain attention without difficulty but did exhibit some minor response delays at times. She corrected speech errors during unstructured conversation when SLP cued her/indicated inability to understand what she meant. She named items in a given category with minA for naming 6-7 and min-modA for 10. Patient continues to benefit from skilled SLP intervention to maximize cognitive-linguistic, speech and swallow function prior to discharge.  Pain Pain Assessment Pain Scale: 0-10 Pain Score: 0-No pain  Therapy/Group: Individual Therapy  Sonia Baller, MA, CCC-SLP Speech Therapy

## 2020-12-24 LAB — VITAMIN D 25 HYDROXY (VIT D DEFICIENCY, FRACTURES): Vit D, 25-Hydroxy: 60.28 ng/mL (ref 30–100)

## 2020-12-24 NOTE — Progress Notes (Signed)
Speech Language Pathology Weekly Progress and Session Note  Patient Details  Name: Candice Perez MRN: [id number] Date of Birth: Oct 07, 1950  Beginning of progress report period:  12/16/2020 End of progress report period:  12/24/2020  Today's Date: 12/24/2020 SLP Individual Time: 1345-1415 SLP Individual Time Calculation (min): 30 min  Short Term Goals: Week 2: SLP Short Term Goal 1 (Week 2): Patient will perform divergent naming task and name at least 10 items corresponding to a given category, with minA. SLP Short Term Goal 1 - Progress (Week 2): Met SLP Short Term Goal 2 (Week 2): Patient will tolerate trials of Dys 3 solids without significant oral residuals, with minA cues. SLP Short Term Goal 2 - Progress (Week 2): Met SLP Short Term Goal 3 (Week 2): Patient will describe object function/features with minA cues. SLP Short Term Goal 3 - Progress (Week 2): Met SLP Short Term Goal 4 (Week 2): Patient will participate in more in depth assessment of her phoneme production errors. SLP Short Term Goal 4 - Progress (Week 2): Other (comment) (will defer to next venue of care) SLP Short Term Goal 5 (Week 2): Patient will imitate to produce errored phonemes (/s, z, f, "th", l/) with modA cues. SLP Short Term Goal 5 - Progress (Week 2): Progressing toward goal SLP Short Term Goal 6 (Week 2): Patient will answer comprehension questions after reading 2-4 sentence paragraph/text with 80% accuracy and minA. SLP Short Term Goal 6 - Progress (Week 2): Met    New Short Term Goals: Week 3: SLP Short Term Goal 1 (Week 3): STG=LTG  Weekly Progress Updates:  Patient made good progress and met 4/6 goals with one of the goals being deferred to next venue of care (assessment of phonological processes) secondary to focus on language and dysphagia. Patient able to communicate at phrase and conversational level with continued improvements in verbal fluency as well as improved word finding. She has been  upgraded to Dys 3 solids from Dys 2 solids and is tolerating without difficulty.   Intensity: Minumum of 1-2 x/day, 30 to 90 minutes Frequency: 3 to 5 out of 7 days Duration/Length of Stay: 8/16 Treatment/Interventions: Cognitive remediation/compensation;Dysphagia/aspiration precaution training;Speech/Language facilitation;Environmental controls;Patient/family education;Functional tasks   Daily Session  Skilled Therapeutic Interventions: Patient seen with husband present for skilled ST session focusing on speech-language goals and family and patient education. Patient completed reading comprehension (short paragraph) and although she was able to answer basic level recall and comprehension questions, when reading, she required significant amount of extra time. When asked about this she did confirm that it was harder for her and took longer to read and understand what she was reading. SLP educated patient and husband on diet consistencies (anticipating she will be able to trial regular textures at home with supervision), speech and language errors that remain but also the significant amount of progress she has made. Patient and spouse both in agreement with SLP that [facility] or OP SLP will be beneficial upon discharge from CIR next week (8/16).     General    Pain Pain Assessment Pain Scale: 0-10 Pain Score: 1  Faces Pain Scale: No hurt PAINAD (Pain Assessment in Advanced Dementia) Breathing: normal Negative Vocalization: none  Therapy/Group: Individual Therapy  Sonia Baller, MA, CCC-SLP Speech Therapy

## 2020-12-24 NOTE — Progress Notes (Signed)
Physical Therapy Weekly Progress Note  Patient Details  Name: Candice Perez MRN: [id number] Date of Birth: 1950-12-19  Beginning of progress report period: December 17, 2020 End of progress report period: December 24, 2020  Today's Date: 12/24/2020 PT Individual Time: 1000-1055 + 1415-1500 PT Individual Time Calculation (min): 55 min  + 45 min   Patient has met 2 of 3 short term goals.  Pt making appropriate progress towards goals and is on track to meet LTG. She has missed a few days of therapy due to refusal related to fatigue and difficulty achieving a full nights rest. She completes bed mobility with supervision, sit<>stand transfers with supervision, and stand<>pivot transfers with CGA and wide based quad cane [facility]). She's ambulating 80-116f with CGA and WBQC and has shown ability to navigate smaller 3inch 16 steps with CGA and 2 hand rails. She continues to be primarily limited by premorbid deconditioning, body habitus, decreased dynamic standing balance, RUE sensory deficits, RUE and RLE weakness, and gait impairments.   Patient continues to demonstrate the following deficits muscle weakness, decreased cardiorespiratoy endurance, unbalanced muscle activation and decreased coordination, decreased motor planning, decreased problem solving, and decreased standing balance, decreased postural control, hemiplegia, and decreased balance strategies and therefore will continue to benefit from skilled PT intervention to increase functional independence with mobility.  Patient progressing toward long term goals..  Continue plan of care.  PT Short Term Goals Week 2:  PT Short Term Goal 1 (Week 2): Pt will complete bed mobility mod I PT Short Term Goal 1 - Progress (Week 2): Progressing toward goal PT Short Term Goal 2 (Week 2): Pt will complete bed<>chair transfers consistently with CGA and LRAD PT Short Term Goal 2 - Progress (Week 2): Met PT Short Term Goal 3 (Week 2): Pt will ambulate 1056fwith  CGA and LRAD PT Short Term Goal 3 - Progress (Week 2): Progressing toward goal Week 3:  PT Short Term Goal 1 (Week 3): STG = LTG due to ELOS  Skilled Therapeutic Interventions/Progress Updates:     1st session: Pt seated in w/c to start session, ready to begin PT. No reports of pain and appears to be improved spiritis compared to the past few days and reports good nights rest. W/c transport downstairs to 61M ortho rehab gym. Completed ambulatory car transfer with CGA and [facility] able to manage LE's without assist. Worked on ambulating up/down inclined ramp and over unlevel mulch pt with CGA and WBQC. Practiced curb transfers with WBChildren[facility] Clinics Minnehere she required minA for balance. Worked on endurance training and balance training with alternating toe taps to 4inch platform with WBCory[facility] Hospitalupport and CGA for safety - 3x15 bouts with seated rest. Also worked on cuWater engineerith 4iSealed Air Corporationith repeated step up/downs, 3x15 with minA for balance. She was able to perform short distance gait throughout session, ranging from 20-5037fith close supervision and WBQ[facility] The [facility]landsait speed decreased overall and has a step-to gait pattern but not LOB or knee buckling present. She was returned upstairs at end of session and remained seated in w/c with all needs met. MD at bedside for morning rounding.   2nd session: Pt greeted seated in w/c to start session. Husband at bedside and pt agreeable to therapy. Focus of session to review family education and training with husband. Dicussed DC planning, home safety, DME rec's, f/u therapies (OPPT vs HHPT), R hemi weakness, RUE sensory deficits, fall prevention and energy conservation strategies, etc. Husband and pt voiced understanding of all  and appreciative of education.   Pt transported downstairs to 91M rehab gym. Ambulatory transfer to car simulator with car height set to replicate [place] - pt complete at close supervision level with [facility] with husband actively observing. Pt ambulated  from ortho rehab gym to main rehab gym, ~176f, with close supervision and [facility]- demo's varied step-to vs step-through gait pattern with gait speed grossly decreased (yesterday's TUG was 53 seconds).   No knee buckling or LOB present but reminded husband of PT supervision goals and to always be in arms-length of pt when ambulating or transferring - he voiced understanding. Pt returned upstairs to room in w/c and remained seated with chair alarm on and all needs met. No further questions from pt or family, they feel prepared for upcoming DC.  Therapy Documentation Precautions:  Precautions Precautions: Fall Restrictions Weight Bearing Restrictions: No Other Position/Activity Restrictions: Right Hemiplegia General:    Therapy/Group: Individual Therapy  CAlger Simons8/04/2021, 7:38 AM

## 2020-12-24 NOTE — Progress Notes (Signed)
Patient ID: Candice Perez, female   DOB: 04/01/1951, 70 y.o.   MRN: [id number] Met with pt and husband who is here for education in preparation for discharge next week. Pt is feeling better today and doing better. Discussed follow up therapies and equipment. Pt and husband have no pref regarding either. Continue to work on discharge needs.

## 2020-12-24 NOTE — Progress Notes (Signed)
Occupational Therapy Session Note  Patient Details  Name: Candice Perez MRN: [id number] Date of Birth: Feb 27, 1951  Today's Date: 12/24/2020 OT Individual Time: 1310-1355 OT Individual Time Calculation (min): 45 min    Short Term Goals: Week 2:  OT Short Term Goal 1 (Week 2): Pt will complete toilet transfers with Supervision. OT Short Term Goal 2 (Week 2): Pt will complete toileting with min A. OT Short Term Goal 3 (Week 2): Pt will don pants with min A.    Skilled Therapeutic Interventions/Progress Updates:  Pt sitting up in w/c with husband present for family education.  Pt agreeable to take a shower.  Ambulated to shower and step over threshold (which husband reports she has at home as well) with CGA.  Pt completed UB dressing with setup and LB dressing with min assist.  UB/LB bathing with min assist sit<>stand at shower bench using grab bars.  Pts husband reports they do not have grab bars in shower at home.  Discussed using lateral leans to wash buttocks to increase safety.  Pt stepped out of shower and sat at 3 in 1 commode with CGA.  Educated husband on body mechanics when assisting pt with donning footwear due to he reported he gets light headed if he bends over.  Visual demo provided donning socks in seated position with pts foot in therapists lap.  Pt ambulated 10 feet from bathroom to w/c with husband providing CGA.  Call bell in reach at end of session.    Therapy Documentation Precautions:  Precautions Precautions: Fall Restrictions Weight Bearing Restrictions: No Other Position/Activity Restrictions: Right Hemiplegia    Therapy/Group: Individual Therapy  Ezekiel Slocumb 12/24/2020, 3:40 PM

## 2020-12-24 NOTE — Progress Notes (Signed)
PROGRESS NOTE   Subjective/Complaints: C/o dysuria: UA/UC ordered C/o chronic left lower extremity swelling- advised elevating extremity and placed nursing order Rash in arms resolved- d/c hydrocortisone  ROS: Patient denies fever, rash, sore throat, blurred vision, nausea, vomiting, diarrhea, cough, shortness of breath or chest pain, headache, or mood change. +phlegm, +dysuria   Objective:   No results found. No results for input(s): WBC, HGB, HCT, PLT in the last 72 hours.  No results for input(s): NA, K, CL, CO2, GLUCOSE, BUN, CREATININE, CALCIUM in the last 72 hours.   Intake/Output Summary (Last 24 hours) at 12/24/2020 1111 Last data filed at 12/24/2020 0814 Gross per 24 hour  Intake 718 ml  Output --  Net 718 ml        Physical Exam: Vital Signs Blood pressure 109/60, pulse 61, temperature 98.3 F (36.8 C), resp. rate 14, height '5\' 7"'$  (1.702 m), weight 124.4 kg, SpO2 97 %. Gen: no distress, normal appearing HEENT: oral mucosa pink and moist, NCAT Cardio: Reg rate Chest: normal effort, normal rate of breathing Abd: soft, non-distended Ext: Left lower extremity 1+ edema Psych: pleasant, normal affect   Skin: NSL Right wrist. Healing abrasion/wound right forearm, sl tender. Rash on bilateral arms and bacl Neuro: right central 7, ongoing word finding deficits. Speech non-fluent but able to speak in phrases and short sentences with extra time.  Reflexes are 2+ in all 4's. Fine motor coordination is intact. No tremors. Motor function is grossly 5/5 LUE and LLE. RUE 3+ to 4- prox to 1-2/5 at wrist and HI. RLE: 3-4/5 prox to distal. Sensation remains sl decreased to LT/PP RUE and RLE Musculoskeletal: tenderness with ROM of right wrist. No edema   Assessment/Plan: 1. Functional deficits which require 3+ hours per day of interdisciplinary therapy in a comprehensive inpatient rehab setting. Physiatrist is providing  close team supervision and 24 hour management of active medical problems listed below. Physiatrist and rehab team continue to assess barriers to discharge/monitor patient progress toward functional and medical goals  Care Tool:  Bathing    Body parts bathed by patient: Right arm, Left arm, Chest, Abdomen, Front perineal area, Right upper leg, Left upper leg, Face, Buttocks   Body parts bathed by helper: Right lower leg, Left lower leg Body parts n/a: Buttocks   Bathing assist Assist Level: Minimal Assistance - Patient > 75%     Upper Body Dressing/Undressing Upper body dressing   What is the patient wearing?: Pull over shirt, Bra    Upper body assist Assist Level: Moderate Assistance - Patient 50 - 74% (min shirt, mod bra)    Lower Body Dressing/Undressing Lower body dressing      What is the patient wearing?: Incontinence brief, Pants     Lower body assist Assist for lower body dressing: Moderate Assistance - Patient 50 - 74%     Toileting Toileting    Toileting assist Assist for toileting: Moderate Assistance - Patient 50 - 74%     Transfers Chair/bed transfer  Transfers assist     Chair/bed transfer assist level: Contact Guard/Touching assist     Locomotion Ambulation   Ambulation assist      Assist level: Contact  Guard/Touching assist Assistive device: Cane-quad Max distance: 14'   Walk 10 feet activity   Assist     Assist level: Contact Guard/Touching assist Assistive device: Cane-quad   Walk 50 feet activity   Assist Walk 50 feet with 2 turns activity did not occur: Safety/medical concerns  Assist level: Contact Guard/Touching assist Assistive device: Cane-quad    Walk 150 feet activity   Assist Walk 150 feet activity did not occur: Safety/medical concerns (fatigue)         Walk 10 feet on uneven surface  activity   Assist Walk 10 feet on uneven surfaces activity did not occur: Safety/medical concerns          Wheelchair     Assist Will patient use wheelchair at discharge?: No   Wheelchair activity did not occur: N/A         Wheelchair 50 feet with 2 turns activity    Assist    Wheelchair 50 feet with 2 turns activity did not occur: N/A       Wheelchair 150 feet activity     Assist  Wheelchair 150 feet activity did not occur: N/A       Blood pressure 109/60, pulse 61, temperature 98.3 F (36.8 C), resp. rate 14, height '5\' 7"'$  (1.702 m), weight 124.4 kg, SpO2 97 %.  Medical Problem List and Plan: 1.  Right side weakness and aphasia secondary to left MCA infarct due to left MCA occlusion status post thrombectomy with TIC12c etiology likely due to newly diagnosed atrial fibrillation             -patient may  shower             -ELOS/Goals: 14-16 days- supervision  -Continue CIR therapies including PT, OT, and SLP     Vitamin D level reviewed and normal 2.  New onset atrial fibrillation: continue Eliquis, Amiodarone 200 mg daily, Lanoxin 0.125 mg daily, Cardizem 240 mg daily, increase Lopressor to '50mg'$  TID. Magnesium level reviewed and stable.  3. Hand joint OA: voltaren gel ordered with improvement. Placed nursing order to indicate that patient may use her home tart cherry supplement three times per day- we were unable to get this back from pharmacy- family will bring in new bottle and discussed with nursing to leave this at her bedside  -Also reviewed the following additional supplement which can be helpful for her OA: OMEGA 3 FATTY ACIDS, TURMERIC, GINGER,  CELERY SEED, GLUCOSAMINE WITH CHONDROITIN   4. Mood: Provide emotional support             -antipsychotic agents: N/A 5. Neuropsych: This patient is not ? capable of making decisions on her own behalf. 6. Skin/Wound Care: Local measures as needed RUE.  7. Fluids/Electrolytes/Nutrition: encourage appropriate PO   .   8.  New onset atrial fibrillation.   9.  Hypothyroidism.  Continue Synthroid 10.  Dysphagia.   Dysphagia #2 thin liquids.  adv per SLP, watch for pocketing 11.  Hyperlipidemia.  Continue Crestor 12.  Morbid obesity.  BMI 45.75.  Dietary follow-up. Discontinue Ensure 13.  AKI/prerenal.   -encourage PO fluids  -labs look better  14. HTN: increase lopressor to '50mg'$  TID, continue to monitor for effects of yesterday's increase 15. Prediabetes HgbA1c 6.2- d/c ensure. Schedule for outpatient Qutenza for diabetic peripheral neuropathy. Provided dietary education 17. Wrist drop: resting hand splint ordered.  18. Insomnia: related to right hand OA-   '300mg'$  Gabapentin HS- helped 19. Tachycardia: continue lopressor to 62.5 TID,  improved 20. Rash bilateral arms and back: discussed using hypoallergenic sheets with nursing, improved- discontinue hydrocortisone. Medications reviewed- none new 21. Polypharamcy: all medications reviewed 8/8.  22. Aphasia: continue working on reading comprehension, expressive aphasia with SLP 23. Phelgm production: mucinex started. Ordered incentive spirometer and out of bed for meals and educated patient regarding benefits of these to prevent atelectasis and pneumonia.  24. Dysuria: UA/UC ordered 25. Left lower extremity edema: chronic, orders placed ro elevate       LOS: 15 days A FACE TO FACE EVALUATION WAS PERFORMED  Candice Perez P Candice Perez 12/24/2020, 11:11 AM

## 2020-12-25 LAB — URINALYSIS, ROUTINE W REFLEX MICROSCOPIC
Bilirubin Urine: NEGATIVE
Glucose, UA: NEGATIVE mg/dL
Ketones, ur: NEGATIVE mg/dL
Nitrite: POSITIVE — AB
Protein, ur: 100 mg/dL — AB
RBC / HPF: >50 RBC/hpf — ABNORMAL HIGH (ref 0–5)
Specific Gravity, Urine: 1.015 (ref 1.005–1.030)
WBC, UA: >50 WBC/hpf — ABNORMAL HIGH (ref 0–5)
pH: 6 (ref 5.0–8.0)

## 2020-12-25 MED ORDER — NITROFURANTOIN MONOHYD MACRO 100 MG PO CAPS
100.0000 mg | ORAL_CAPSULE | Freq: Two times a day (BID) | ORAL | Status: DC
  Administered 2020-12-25 – 2020-12-30 (×10): 100 mg via ORAL
  Filled 2020-12-25 (×11): qty 1

## 2020-12-25 NOTE — Progress Notes (Signed)
Patient informed staff that husband visited yesterday for family education, then became ill and was transported to the ED where he tested positive for Covid. Infectious disease contacted and recommenced putting patient "under investigation" and following up with Covid test in 3-5 days (Monday or Tuesday). Charge nurse notified and patient placed on airborne precautions for the time being.

## 2020-12-25 NOTE — Progress Notes (Signed)
Speech Language Pathology Daily Session Note  Patient Details  Name: Candice Perez MRN: [id number] Date of Birth: [birth year]  Today's Date: 12/25/2020 SLP Individual Time: [id number] SLP Individual Time Calculation (min): 43 min  Short Term Goals: Week 3: SLP Short Term Goal 1 (Week 3): STG=LTG  Skilled Therapeutic Interventions:Skilled ST services focused on swallow and speech skills. SLP facilitated regular texture snack trial, pt reported increase in difficulty clearing mild residue on right side but was able to utilize strategies mod I for lingual sweeps with extra time. SLP provided education that pt can advance to regular textures at home with use of lingual sweeps and finger sweeps if needed. SLP continued to assess auditory comprehension skills, pt was able to follow 1-2 step directions mod I, but 3 step directions requested continuous repetition likely further impacted by impaired short term recall along with auditory comprehension skills. Pt was able to name 7 animals in divergent naming task increasing to 10 animals within a minute with subcategory cues and then carrier over strategy to name 10 vegetables within a minute time frame. Pt demonstrated 80-85% intelligibility in conversation/sentence level due to articulation and word finding errors with ability to correct articulation errors given min A verbal/visual cues and supervision A verbal cues for word finding. Pt was left in room with call bell within reach and chair alarm set. SLP recommends to continue skilled services.     Pain Pain Assessment Pain Score: 0-No pain  Therapy/Group: Individual Therapy  Chanese Hartsough  [facility] 12/25/2020, 4:19 PM

## 2020-12-26 DIAGNOSIS — I10 Essential (primary) hypertension: Secondary | ICD-10-CM

## 2020-12-26 DIAGNOSIS — N390 Urinary tract infection, site not specified: Secondary | ICD-10-CM

## 2020-12-26 DIAGNOSIS — G8191 Hemiplegia, unspecified affecting right dominant side: Secondary | ICD-10-CM

## 2020-12-26 DIAGNOSIS — R000 Tachycardia, unspecified: Secondary | ICD-10-CM

## 2020-12-26 NOTE — Progress Notes (Signed)
PROGRESS NOTE   Subjective/Complaints: Patient seen sitting up in bed this morning.  She states she slept well overnight.  Yesterday, patient placed on airborne precautions due to husband who recently visited testing positive for COVID due to symptoms.  Patient denies symptoms at present.  ROS: Denies CP, SOB, N/V/D  Objective:   No results found. No results for input(s): WBC, HGB, HCT, PLT in the last 72 hours.  No results for input(s): NA, K, CL, CO2, GLUCOSE, BUN, CREATININE, CALCIUM in the last 72 hours.   Intake/Output Summary (Last 24 hours) at 12/26/2020 2308 Last data filed at 12/26/2020 1837 Gross per 24 hour  Intake 840 ml  Output --  Net 840 ml         Physical Exam: Vital Signs Blood pressure (!) 106/55, pulse 73, temperature 98.6 F (37 C), temperature source Oral, resp. rate 16, height '5\' 7"'$  (1.702 m), weight 120.3 kg, SpO2 94 %. Constitutional: No distress . Vital signs reviewed. HENT: Normocephalic.  Atraumatic. Eyes: EOMI. No discharge. Cardiovascular: No JVD.  RRR. Respiratory: Normal effort.  No stridor.  Bilateral clear to auscultation. GI: Non-distended.  BS +. Skin: Warm and dry.  Intact. Psych: Normal mood.  Normal behavior. Musc: Lower extremity edema..  No tenderness in extremities. Neuro: Alert Neuro:.:  Shoulder abduction, elbow flexion/extension 4 -/5, handgrip 1+/5  Assessment/Plan: 1. Functional deficits which require 3+ hours per day of interdisciplinary therapy in a comprehensive inpatient rehab setting. Physiatrist is providing close team supervision and 24 hour management of active medical problems listed below. Physiatrist and rehab team continue to assess barriers to discharge/monitor patient progress toward functional and medical goals  Care Tool:  Bathing    Body parts bathed by patient: Right arm, Left arm, Chest, Abdomen, Front perineal area, Right upper leg, Left upper  leg, Face, Buttocks   Body parts bathed by helper: Right lower leg, Left lower leg Body parts n/a: Buttocks   Bathing assist Assist Level: Minimal Assistance - Patient > 75%     Upper Body Dressing/Undressing Upper body dressing   What is the patient wearing?: Pull over shirt    Upper body assist Assist Level: Set up assist    Lower Body Dressing/Undressing Lower body dressing      What is the patient wearing?: Incontinence brief, Pants     Lower body assist Assist for lower body dressing: Minimal Assistance - Patient > 75%     Toileting Toileting    Toileting assist Assist for toileting: Moderate Assistance - Patient 50 - 74%     Transfers Chair/bed transfer  Transfers assist     Chair/bed transfer assist level: Contact Guard/Touching assist     Locomotion Ambulation   Ambulation assist      Assist level: Contact Guard/Touching assist Assistive device: Cane-quad Max distance: 85'   Walk 10 feet activity   Assist     Assist level: Contact Guard/Touching assist Assistive device: Cane-quad   Walk 50 feet activity   Assist Walk 50 feet with 2 turns activity did not occur: Safety/medical concerns  Assist level: Contact Guard/Touching assist Assistive device: Cane-quad    Walk 150 feet activity   Assist Walk 150  feet activity did not occur: Safety/medical concerns (fatigue)         Walk 10 feet on uneven surface  activity   Assist Walk 10 feet on uneven surfaces activity did not occur: Safety/medical concerns         Wheelchair     Assist Will patient use wheelchair at discharge?: No   Wheelchair activity did not occur: N/A         Wheelchair 50 feet with 2 turns activity    Assist    Wheelchair 50 feet with 2 turns activity did not occur: N/A       Wheelchair 150 feet activity     Assist  Wheelchair 150 feet activity did not occur: N/A       Blood pressure (!) 106/55, pulse 73, temperature 98.6 F  (37 C), temperature source Oral, resp. rate 16, height '5\' 7"'$  (1.702 m), weight 120.3 kg, SpO2 94 %.  Medical Problem List and Plan: 1.  Right side hemiparesis and aphasia secondary to left MCA infarct due to left MCA occlusion status post thrombectomy with TIC12c etiology likely due to newly diagnosed atrial fibrillation  Continue CIR 2.  New onset atrial fibrillation: continue Eliquis, Amiodarone 200 mg daily, Lanoxin 0.125 mg daily, Cardizem 240 mg daily, increase Lopressor to '50mg'$  TID. Magnesium level reviewed and stable.  3. Hand joint OA: voltaren gel ordered with improvement. Placed nursing order to indicate that patient may use her home tart cherry supplement three times per day- we were unable to get this back from pharmacy- family will bring in new bottle and discussed with nursing to leave this at her bedside  -Also reviewed the following additional supplement which can be helpful for her OA: OMEGA 3 FATTY ACIDS, TURMERIC, GINGER,  CELERY SEED, GLUCOSAMINE WITH CHONDROITIN   4. Mood: Provide emotional support             -antipsychotic agents: N/A 5. Neuropsych: This patient is not ? capable of making decisions on her own behalf. 6. Skin/Wound Care: Local measures as needed RUE.  7. Fluids/Electrolytes/Nutrition: encourage appropriate PO 8.  New onset atrial fibrillation.   9.  Hypothyroidism.  Continue Synthroid 10.  Dysphagia.  Dysphagia #2 thin liquids.  adv per SLP, watch for pocketing  Advance diet as tolerated 11.  Hyperlipidemia.  Continue Crestor 12.  Morbid obesity.  BMI 45.75.  Dietary follow-up. Discontinue Ensure 13.  AKI/prerenal.   -encourage PO fluids  -labs look better  14. HTN: increase lopressor to '50mg'$  TID, continue to monitor for effects of   Controlled on 8/14, monitor for hypotension 15. Prediabetes HgbA1c 6.2- d/c ensure. Schedule for outpatient Qutenza for diabetic peripheral neuropathy. Provided dietary education 17. Wrist drop: resting hand splint  ordered.  18. Insomnia: related to right hand OA-   '300mg'$  Gabapentin HS- helped 19. Tachycardia: continue lopressor to 62.5 TID Improving  20. Rash bilateral arms and back: discussed using hypoallergenic sheets with nursing, improved- discontinue hydrocortisone. Medications reviewed- none new 21. Polypharamcy: all medications previously reviewed.  22. Aphasia: continue working on reading comprehension, expressive aphasia with SLP 23. Phelgm production: mucinex started. Ordered incentive spirometer and out of bed for meals and educated patient regarding benefits of these to prevent atelectasis and pneumonia.   Plan for COVID test in 1-3 days. 24.  Acute lower UTI  E. coli, sensitivities pending  Empiric Macrobid started on 8/13 25. Left lower extremity edema: chronic, orders placed ro elevate    LOS: 17 days A FACE TO  FACE EVALUATION WAS PERFORMED  Sidhant Helderman Lorie Phenix 12/26/2020, 11:08 PM

## 2020-12-26 NOTE — Progress Notes (Signed)
Physical Therapy Session Note  Patient Details  Name: Candice Perez MRN: [id number] Date of Birth: 12-05-1950  Today's Date: 12/26/2020 PT Individual Time: 0940-1006  PT Individual Time Calculation (min): 26 min    Short Term Goals: Week 2:  PT Short Term Goal 1 (Week 2): Pt will complete bed mobility mod I PT Short Term Goal 1 - Progress (Week 2): Progressing toward goal PT Short Term Goal 2 (Week 2): Pt will complete bed<>chair transfers consistently with CGA and LRAD PT Short Term Goal 2 - Progress (Week 2): Met PT Short Term Goal 3 (Week 2): Pt will ambulate 154f with CGA and LRAD PT Short Term Goal 3 - Progress (Week 2): Progressing toward goal Week 3:  PT Short Term Goal 1 (Week 3): STG = LTG due to ELOS  Skilled Therapeutic Interventions/Progress Updates:  Prior to entrance to room, RN and NT informed this PT that pt's husband visited the day before for family education therapy sessions. At end of sessions, husband related not feeling well and son recommended husband visit this hospital's ED as he believed this the best course of action. Husband transported and was since found to be (+) for COVID-19. Pt is now believed to have been exposed and staff is awaiting charge nurse arrival for current instructions re: precautions, PPE and treatment. On charge nurse arrival, Infection Control dept contacted and determined that pt can be seen in room for therapy session or must be last pt in gym prior to gym cleaning. N95 mask to be worn for airborne precautions in room. Pt will need to be COVID tests on day 3 post exposure - determined to be Monday. Until testing occurs, no outside visitors allowed in room. PT allowed to treat in room and cleared by RN to relate this info to pt.   Patient seated EOB on entrance to room. Patient alert and unaware of PT session but agreeable. No pain complaint during session. Info related to pt and pt demos understanding. Pt also relates that husband was not  wearing mask and is not vaccinated. She is also unvaccinated.   Therapeutic Activity: Transfers: Patient performed STS and ambulatory SPVT transfer to recliner with close supervision. Provided verbal cues for reaching back for armrests of recliner prior to descending to sit. Pt also performs toilet transfer with CGA/ supervision. Is able to doff pants with CGA for balance, but requires Min A to don d/t reduced pinch grasp in R hand and pants slipping from grasp. Good compensatory strategy noted with assist from L hand to help complete pull up of front of pants with Min A to complete in back.   Gait Training:  Patient ambulated 35 feet total using LBQC with light CGA. Demonstrated decreased step height/ length overall. Provided vc/ tc for increasing step height while leading swing through with knee.  Patient seated  in recliner at end of session with brakes locked, belt alarm set, and all needs within reach.    Therapy Documentation Precautions:  Precautions Precautions: Fall Restrictions Weight Bearing Restrictions: No Other Position/Activity Restrictions: Right Hemiplegia  Therapy/Group: Individual Therapy  JAlger SimonsPT, DPT 12/26/2020, 10:57 PM

## 2020-12-26 NOTE — Progress Notes (Signed)
Occupational Therapy Session Note  Patient Details  Name: Candice Perez MRN: [id number] Date of Birth: [birth year]  Today's Date: 12/26/2020 OT Missed Time: 71 Minutes Missed Time Reason: Patient fatigue;Patient ill (comment)  Pt supine upon arrival reporting she is experiencing a mild sore throat and general malaise. Pt requesting to not participate in 30 min session. Pt left supine with all needs met.   Curtis Sites 12/26/2020, 7:28 AM

## 2020-12-26 NOTE — Discharge Summary (Signed)
Physician Discharge Summary  Patient ID: Candice Perez MRN: [id number] DOB/AGE: 1951-03-05 70 y.o.  Admit date: 12/09/2020 Discharge date: 12/30/2020  Discharge Diagnoses:  Principal Problem:   Left middle cerebral artery stroke [facility]) Active Problems:   Right hemiplegia (HCC)   Right hemiparesis ([place])   Essential hypertension   Sinus tachycardia   Acute lower UTI New onset atrial fibrillation Hypothyroidism Dysphagia Hyperlipidemia Morbid obesity AKI/prerenal Hypertension Prediabetes Insomnia E. coli UTI COVID  Discharged Condition: Stable  Significant Diagnostic Studies: MR BRAIN WO CONTRAST  Result Date: 12/03/2020 CLINICAL DATA:  Stroke follow-up EXAM: MRI HEAD WITHOUT CONTRAST TECHNIQUE: Multiplanar, multiecho pulse sequences of the brain and surrounding structures were obtained without intravenous contrast. COMPARISON:  CT angio head neck 12/02/2020 FINDINGS: Brain: Restricted diffusion compatible with acute infarct in the left frontal parietal cortex. This appears to be the postcentral gyrus. Mild-to-moderate area of cortical infarction. No associated hemorrhage. No other areas of acute infarct. Mild white matter changes with patchy white matter hyperintensities bilaterally. Small hyperintensity central pons. No hydrocephalus or mass. Vascular: Normal arterial flow voids. Skull and upper cervical spine: No focal skeletal lesion Sinuses/Orbits: Mild mucosal edema paranasal sinuses. Negative orbit Other: None IMPRESSION: Small to moderate acute cortical infarct in the left frontal parietal cortex primarily in the postcentral gyrus. No associated hemorrhage. Electronically Signed   By: Franchot Gallo M.D.   On: 12/03/2020 16:05   IR CT Head Ltd  Result Date: 12/04/2020 INDICATION: New onset right-sided weakness with global aphasia. Occluded superior division of the left middle cerebral artery on CT angiogram of the head and neck. EXAM: 1. EMERGENT LARGE VESSEL OCCLUSION  THROMBOLYSIS (anterior CIRCULATION) COMPARISON:  CT angiogram of the head and neck of December 02, 2020. MEDICATIONS: Ancef 2 g IV antibiotic was administered within 1 hour of the procedure. ANESTHESIA/SEDATION: General anesthesia. CONTRAST:  Omnipaque 300 approximately 70 mL. FLUOROSCOPY TIME:  Fluoroscopy Time: 53 minutes 18 seconds (1538 mGy). COMPLICATIONS: None immediate. TECHNIQUE: Following a full explanation of the procedure along with the potential associated complications, an informed witnessed consent was obtained. The risks of intracranial hemorrhage of 10%, worsening neurological deficit, ventilator dependency, death and inability to revascularize were all reviewed in detail with the patient's spouse. The patient was then put under general anesthesia by the Department of Anesthesiology at [facility]. The right groin was prepped and draped in the usual sterile fashion. Thereafter using modified Seldinger technique, transfemoral access into the right common femoral artery was obtained without difficulty. Over a 0.035 inch guidewire an8 [place] Pinnacle 25 cm sheath was inserted. Through this, and also over a 0.035 inch guidewire a 5 [place] JB 1 catheter was advanced to the aortic arch region and selectively positioned left common carotid artery. FINDINGS: The left common carotid arteriogram demonstrates the left external carotid artery and its major branches to be widely patent. The left internal carotid artery at the bulb to the cranial skull base is widely patent. There is a small focal ulceration seen of the left internal carotid artery just distal to the bulb associated with mild narrowing. More distally the left internal carotid artery is seen to opacify to the cranial skull base. The petrous, the cavernous and the supraclinoid segments are widely patent. The left anterior cerebral artery opacifies into the capillary and venous phases. The left middle cerebral artery M1 segment is widely patent.  Patency is seen of the left middle cerebral artery inferior division, and the anterior temporal branch. A stump of an occluded superior division  is noted proximally. PROCEDURE: Over a 0.035 inch 300 cm Rosen exchange guidewire, an 087 balloon guide catheter which had been prepped with 50% contrast and 50% heparinized saline infusion was advanced and positioned just proximal to the left common carotid bifurcation. The guidewire was removed. Good aspiration obtained from the hub of the balloon guide catheter. A control arteriogram performed through this demonstrated no evidence of focal vasospasm, or of dissection or of filling defects. Over a 0.014 inch standard Synchro micro guidewire with a J-tip configuration, a combination of an 021 160 cm Trevo Trak microcatheter inside of an 071 Zoom 132 cm aspiration catheter was advanced to the supraclinoid left ICA. The micro guidewire was then gently advanced through the occluded superior division into the M2 M3 region followed by the microcatheter. The Zoom aspiration catheter was now advanced into the proximal left M1 segment. The guidewire was removed. Good aspiration obtained from the hub of the microcatheter. A gentle control arteriogram performed through the microcatheter demonstrated safe position of the tip of the microcatheter with antegrade flow. This was then connected to continuous heparinized saline infusion. A 4 mm x 40 mm Solitaire X retrieval device was then advanced to the distal end of the microcatheter. With slight forward gentle traction with the right hand on the delivery micro guidewire, with the left hand the delivery microcatheter was retrieved unsheathing the retrieval device the proximal end of which was just proximal to the occluded superior division. The Zoom aspiration catheter was now advanced into the orifice of the superior division. Thereafter, with proximal flow arrest in the left internal carotid artery, and constant aspiration applied  with a 20 mL syringe at the hub of the balloon guide catheter, hub of the Zoom aspiration catheter for approximately 2-1/2 minutes, the combination of the retrieval device, the microcatheter, and the Zoom aspiration catheter were retrieved and removed. A few specks were seen in the struts of the retrieval device. A control arteriogram performed through the left internal carotid artery following flow reversal of flow arrest demonstrated improved flow through the superior division. A TICI 2C revascularization was achieved. However, there continued be an appreciable area of hypoperfusion involving the superior division. Over a 0.014 inch standard Synchro micro guidewire with a J configuration, a 132 cm 6 [place] Catalyst guide catheter inside of which was an 021 160 cm Trevo Trak microcatheter was advanced again into the supraclinoid left ICA. The micro guidewire was then gently advanced using a torque device through the superior division into the more anterior branch followed by the microcatheter. The guidewire was removed. Good aspiration obtained from the hub of the microcatheter which was now in the distal M2 segment. A gentle arteriogram performed through the microcatheter demonstrates safe position of the tip of the microcatheter with good antegrade flow. This was then connected to continuous heparinized saline infusion. A 3 mm x 32 mm Trevo ProVue retrieval device was then advanced to the distal end of the microcatheter. The retrieval device was deployed as mentioned earlier. The Catalyst guide catheter was advanced out of the orifice of the superior division. With proximal flow arrest in the left internal carotid artery, and constant aspiration applied at the hub of the balloon guide catheter, and at the hub of the 6 [place] Catalyst guide catheter with an aspiration pump for 2-1/2 minutes, the combination of the retrieval device, the microcatheter, and the aspiration catheter were retrieved and removed.  Following reversal of flow arrest, control arteriogram performed through the balloon guide catheter in  the left internal carotid artery demonstrated near complete revascularization of the left middle cerebral artery distribution achieving a TICI 2C plus revascularization. The left anterior cerebral artery remained widely patent. The balloon guide was then retrieved more proximally into the left common carotid artery. A control arteriogram performed at this time demonstrated wide patency of the left internal carotid artery extra cranially and intracranially. The previously noted ulceration at the proximal aspect of the left internal carotid remains stable. Patency was seen of the left middle cerebral artery with unchanged rapid perfusion of TICI 2C plus. The balloon guide was removed. The 8 [place] Pinnacle sheath was removed with successful hemostasis at the right groin with a 6 French Angio-Seal closure device. Distal pulses remained present in both feet unchanged. CT of the brain performed demonstrated no evidence of intracranial hemorrhage or mass effect. The patient was left intubated due to pre procedural global aphasia and difficulty with communication. Manual pressure was held at the right groin puncture site for hemostasis due to failure of the Angio-Seal closure device. Distal pulses remained present in both feet unchanged. IMPRESSION: Status post endovascular revascularization of occluded superior division of the left middle cerebral artery with 1 pass with the 4 mm x 40 mm Solitaire X retrieval device and contact aspiration, and 1 pass with a 3 mm x 32 mm Trevo ProVue retriever and contact aspiration achieving a TICI 2C plus revascularization. PLAN: As per referring MD. Electronically Signed   By: Luanne Bras M.D.   On: 12/03/2020 17:46   DG Chest Port 1 View  Result Date: 12/07/2020 CLINICAL DATA:  Acute respiratory failure.  Hypoxia. EXAM: PORTABLE CHEST 1 VIEW COMPARISON:  12/03/2020.  FINDINGS: Feeding tube noted with tip below left hemidiaphragm. Stable cardiomegaly. Low lung volumes with mild bibasilar atelectasis. Small left pleural effusion no pneumothorax. IMPRESSION: Feeding tube noted with tip below left hemidiaphragm. 2.  Stable cardiomegaly. 3. Low lung volumes with mild bibasilar atelectasis. Small left pleural effusion. Electronically Signed   By: Marcello Moores  Register   On: 12/07/2020 07:30   DG CHEST PORT 1 VIEW  Result Date: 12/03/2020 CLINICAL DATA:  ETT placement EXAM: PORTABLE CHEST 1 VIEW COMPARISON:  12/02/2020 FINDINGS: Endotracheal tube with the tip 4.5 cm above the carina. Bilateral mild interstitial thickening. No pleural effusion or pneumothorax. Stable cardiomegaly. No acute osseous abnormality. IMPRESSION: Cardiomegaly with pulmonary vascular congestion. Endotracheal tube with the tip 4.5 cm above the carina. Electronically Signed   By: Kathreen Devoid   On: 12/03/2020 11:43   Portable Chest x-ray  Result Date: 12/02/2020 CLINICAL DATA:  Endotracheal tube position EXAM: PORTABLE CHEST 1 VIEW COMPARISON:  11/30/2020 FINDINGS: Endotracheal tube tip is at the level of the clavicular heads. There is moderate cardiomegaly. This is likely exaggerated by supine positioning. Mild left-greater-than-right interstitial opacities, slightly worsened. IMPRESSION: Endotracheal tube tip at the level of the clavicular heads. Worsening of left-greater-than-right interstitial opacities favored to indicate pulmonary edema, though pneumonia remains difficult to exclude. Electronically Signed   By: Ulyses Jarred M.D.   On: 12/02/2020 19:27   DG Abd Portable 1V  Result Date: 12/03/2020 CLINICAL DATA:  NG tube placement EXAM: PORTABLE ABDOMEN - 1 VIEW COMPARISON:  None. FINDINGS: Nasogastric tube with the tip projecting over the antrum of the stomach. No bowel dilatation to suggest obstruction. No evidence of pneumoperitoneum, portal venous gas or pneumatosis. No pathologic calcifications  along the expected course of the ureters. No acute osseous abnormality. IMPRESSION: Nasogastric tube with the tip projecting over the antrum  of the stomach. Electronically Signed   By: Kathreen Devoid   On: 12/03/2020 11:35   ECHOCARDIOGRAM COMPLETE  Result Date: 12/03/2020    ECHOCARDIOGRAM REPORT   Patient Name:   Candice Perez Date of Exam: 12/03/2020 Medical Rec #:  [id number]        Height:       67.0 in Accession #:    [id number]       Weight:       295.0 lb Date of Birth:  [birth year]        BSA:          2.385 m Patient Age:    33 years         BP:           96/70 mmHg Patient Gender: F                HR:           101 bpm. Exam Location:  Inpatient Procedure: 2D Echo, Cardiac Doppler, Color Doppler and Intracardiac            Opacification Agent Indications:    Stroke  History:        Patient has prior history of Echocardiogram examinations, most                 recent 06/20/2017. Risk Factors:Hypertension, Dyslipidemia and                 Former Smoker.  Sonographer:    Vickie Epley RDCS Referring Phys: [id number] [place]  Sonographer Comments: Echo performed with patient supine and on artificial respirator. IMPRESSIONS  1. Technically difficult; definity used; LV function difficult to quantitate due to poor images and tachycardia (pt in atrial fibrillation with HR 120-160); suggest FU imaging when HR better controlled.  2. Left ventricular ejection fraction, by estimation, is 45 to 50%. The left ventricle has mildly decreased function. The left ventricle demonstrates global hypokinesis. Left ventricular diastolic function could not be evaluated.  3. Right ventricular systolic function is normal. The right ventricular size is normal.  4. The mitral valve is normal in structure. No evidence of mitral valve regurgitation. No evidence of mitral stenosis.  5. The aortic valve is tricuspid. Aortic valve regurgitation is not visualized. Mild aortic valve sclerosis is present, with no evidence of aortic valve  stenosis.  6. The inferior vena cava is normal in size with greater than 50% respiratory variability, suggesting right atrial pressure of 3 mmHg. FINDINGS  Left Ventricle: Left ventricular ejection fraction, by estimation, is 45 to 50%. The left ventricle has mildly decreased function. The left ventricle demonstrates global hypokinesis. Definity contrast agent was given IV to delineate the left ventricular  endocardial borders. The left ventricular internal cavity size was normal in size. There is no left ventricular hypertrophy. Left ventricular diastolic function could not be evaluated due to atrial fibrillation. Left ventricular diastolic function could  not be evaluated. Right Ventricle: The right ventricular size is normal. Right ventricular systolic function is normal. Left Atrium: Left atrial size was normal in size. Right Atrium: Right atrial size was normal in size. Pericardium: There is no evidence of pericardial effusion. Mitral Valve: The mitral valve is normal in structure. Mild mitral annular calcification. No evidence of mitral valve regurgitation. No evidence of mitral valve stenosis. Tricuspid Valve: The tricuspid valve is normal in structure. Tricuspid valve regurgitation is trivial. No evidence of tricuspid stenosis. Aortic Valve: The aortic valve is tricuspid. Aortic  valve regurgitation is not visualized. Mild aortic valve sclerosis is present, with no evidence of aortic valve stenosis. Pulmonic Valve: The pulmonic valve was normal in structure. Pulmonic valve regurgitation is trivial. No evidence of pulmonic stenosis. Aorta: The aortic root is normal in size and structure. Venous: The inferior vena cava is normal in size with greater than 50% respiratory variability, suggesting right atrial pressure of 3 mmHg. IAS/Shunts: The interatrial septum was not well visualized. Additional Comments: Technically difficult; definity used; LV function difficult to quantitate due to poor images and  tachycardia (pt in atrial fibrillation with HR 120-160); suggest FU imaging when HR better controlled.  LEFT VENTRICLE PLAX 2D LVOT diam:     2.20 cm LV SV:         55 LV SV Index:   23 LVOT Area:     3.80 cm  RIGHT VENTRICLE TAPSE (M-mode): 1.5 cm LEFT ATRIUM             Index       RIGHT ATRIUM           Index LA Vol (A2C):   63.7 ml 26.70 ml/m RA Area:     21.70 cm LA Vol (A4C):   72.9 ml 30.56 ml/m RA Volume:   65.60 ml  27.50 ml/m LA Biplane Vol: 68.6 ml 28.76 ml/m  AORTIC VALVE LVOT Vmax:   92.87 cm/s LVOT Vmean:  67.600 cm/s LVOT VTI:    0.145 m  AORTA Ao Root diam: 3.50 cm Ao Asc diam:  3.70 cm  SHUNTS Systemic VTI:  0.15 m Systemic Diam: 2.20 cm Kirk Ruths MD Electronically signed by Kirk Ruths MD Signature Date/Time: 12/03/2020/12:52:35 PM    Final    ECHOCARDIOGRAM LIMITED BUBBLE STUDY  Result Date: 12/06/2020    ECHOCARDIOGRAM LIMITED REPORT   Patient Name:   Candice Perez Date of Exam: 12/06/2020 Medical Rec #:  [id number]        Height:       67.0 in Accession #:    [id number]       Weight:       293.0 lb Date of Birth:  Apr 24, 1951        BSA:          2.379 m Patient Age:    68 years         BP:           107/75 mmHg Patient Gender: F                HR:           115 bpm. Exam Location:  Inpatient Procedure: 2D Echo, Cardiac Doppler, Color Doppler, Intracardiac Opacification            Agent and Saline Contrast Bubble Study Indications:     Stroke 434.91 / I63.9  History:         Patient has prior history of Echocardiogram examinations.                  Stroke and TIA, Arrythmias:Atrial Fibrillation; Risk                  Factors:Hypertension and Dyslipidemia. History of DVT. GERD.                  Edema.  Sonographer:     Darlina Sicilian RDCS Referring Phys:  [id number] [place] Diagnosing Phys: Dixie Dials MD IMPRESSIONS  1. Left ventricular ejection fraction, by estimation, is 40  to 45%. The left ventricle has mildly decreased function. The left ventricle demonstrates global  hypokinesis. Left ventricular diastolic parameters are consistent with Grade I diastolic dysfunction (impaired relaxation).  2. Right ventricular systolic function is mildly reduced. The right ventricular size is normal.  3. Left atrial size was moderately dilated.  4. The mitral valve is normal in structure. Mild mitral valve regurgitation.  5. Tricuspid valve regurgitation is mild to moderate.  6. The aortic valve is normal in structure. Aortic valve regurgitation is not visualized. FINDINGS  Left Ventricle: Left ventricular ejection fraction, by estimation, is 40 to 45%. The left ventricle has mildly decreased function. The left ventricle demonstrates global hypokinesis. Definity contrast agent was given IV to delineate the left ventricular  endocardial borders. The left ventricular internal cavity size was normal in size. There is no left ventricular hypertrophy. Left ventricular diastolic parameters are consistent with Grade I diastolic dysfunction (impaired relaxation). Right Ventricle: The right ventricular size is normal. No increase in right ventricular wall thickness. Right ventricular systolic function is mildly reduced. Left Atrium: Left atrial size was moderately dilated. Pericardium: There is no evidence of pericardial effusion. Mitral Valve: The mitral valve is normal in structure. Mild mitral valve regurgitation. Tricuspid Valve: The tricuspid valve is normal in structure. Tricuspid valve regurgitation is mild to moderate. Aortic Valve: The aortic valve is normal in structure. Aortic valve regurgitation is not visualized. Pulmonic Valve: The pulmonic valve was normal in structure. Pulmonic valve regurgitation is not visualized. Aorta: The aortic root is normal in size and structure. Venous: The inferior vena cava was not well visualized. IAS/Shunts: The atrial septum is grossly normal. Agitated saline contrast was given intravenously to evaluate for intracardiac shunting.  LV Volumes (MOD) LV vol d,  MOD A2C: 118.0 ml LV vol d, MOD A4C: 159.0 ml LV vol s, MOD A2C: 65.1 ml LV vol s, MOD A4C: 90.8 ml LV SV MOD A2C:     52.9 ml LV SV MOD A4C:     159.0 ml LV SV MOD BP:      62.1 ml Dixie Dials MD Electronically signed by Dixie Dials MD Signature Date/Time: 12/06/2020/4:08:12 PM    Final    IR PERCUTANEOUS ART THROMBECTOMY/INFUSION INTRACRANIAL INC DIAG ANGIO  Result Date: 12/04/2020 INDICATION: New onset right-sided weakness with global aphasia. Occluded superior division of the left middle cerebral artery on CT angiogram of the head and neck. EXAM: 1. EMERGENT LARGE VESSEL OCCLUSION THROMBOLYSIS (anterior CIRCULATION) COMPARISON:  CT angiogram of the head and neck of December 02, 2020. MEDICATIONS: Ancef 2 g IV antibiotic was administered within 1 hour of the procedure. ANESTHESIA/SEDATION: General anesthesia. CONTRAST:  Omnipaque 300 approximately 70 mL. FLUOROSCOPY TIME:  Fluoroscopy Time: 53 minutes 18 seconds (1538 mGy). COMPLICATIONS: None immediate. TECHNIQUE: Following a full explanation of the procedure along with the potential associated complications, an informed witnessed consent was obtained. The risks of intracranial hemorrhage of 10%, worsening neurological deficit, ventilator dependency, death and inability to revascularize were all reviewed in detail with the patient's spouse. The patient was then put under general anesthesia by the Department of Anesthesiology at [facility]. The right groin was prepped and draped in the usual sterile fashion. Thereafter using modified Seldinger technique, transfemoral access into the right common femoral artery was obtained without difficulty. Over a 0.035 inch guidewire an8 [place] Pinnacle 25 cm sheath was inserted. Through this, and also over a 0.035 inch guidewire a 5 [place] JB 1 catheter was advanced to the aortic arch  region and selectively positioned left common carotid artery. FINDINGS: The left common carotid arteriogram demonstrates the left  external carotid artery and its major branches to be widely patent. The left internal carotid artery at the bulb to the cranial skull base is widely patent. There is a small focal ulceration seen of the left internal carotid artery just distal to the bulb associated with mild narrowing. More distally the left internal carotid artery is seen to opacify to the cranial skull base. The petrous, the cavernous and the supraclinoid segments are widely patent. The left anterior cerebral artery opacifies into the capillary and venous phases. The left middle cerebral artery M1 segment is widely patent. Patency is seen of the left middle cerebral artery inferior division, and the anterior temporal branch. A stump of an occluded superior division is noted proximally. PROCEDURE: Over a 0.035 inch 300 cm Rosen exchange guidewire, an 087 balloon guide catheter which had been prepped with 50% contrast and 50% heparinized saline infusion was advanced and positioned just proximal to the left common carotid bifurcation. The guidewire was removed. Good aspiration obtained from the hub of the balloon guide catheter. A control arteriogram performed through this demonstrated no evidence of focal vasospasm, or of dissection or of filling defects. Over a 0.014 inch standard Synchro micro guidewire with a J-tip configuration, a combination of an 021 160 cm Trevo Trak microcatheter inside of an 071 Zoom 132 cm aspiration catheter was advanced to the supraclinoid left ICA. The micro guidewire was then gently advanced through the occluded superior division into the M2 M3 region followed by the microcatheter. The Zoom aspiration catheter was now advanced into the proximal left M1 segment. The guidewire was removed. Good aspiration obtained from the hub of the microcatheter. A gentle control arteriogram performed through the microcatheter demonstrated safe position of the tip of the microcatheter with antegrade flow. This was then connected to  continuous heparinized saline infusion. A 4 mm x 40 mm Solitaire X retrieval device was then advanced to the distal end of the microcatheter. With slight forward gentle traction with the right hand on the delivery micro guidewire, with the left hand the delivery microcatheter was retrieved unsheathing the retrieval device the proximal end of which was just proximal to the occluded superior division. The Zoom aspiration catheter was now advanced into the orifice of the superior division. Thereafter, with proximal flow arrest in the left internal carotid artery, and constant aspiration applied with a 20 mL syringe at the hub of the balloon guide catheter, hub of the Zoom aspiration catheter for approximately 2-1/2 minutes, the combination of the retrieval device, the microcatheter, and the Zoom aspiration catheter were retrieved and removed. A few specks were seen in the struts of the retrieval device. A control arteriogram performed through the left internal carotid artery following flow reversal of flow arrest demonstrated improved flow through the superior division. A TICI 2C revascularization was achieved. However, there continued be an appreciable area of hypoperfusion involving the superior division. Over a 0.014 inch standard Synchro micro guidewire with a J configuration, a 132 cm 6 [place] Catalyst guide catheter inside of which was an 021 160 cm Trevo Trak microcatheter was advanced again into the supraclinoid left ICA. The micro guidewire was then gently advanced using a torque device through the superior division into the more anterior branch followed by the microcatheter. The guidewire was removed. Good aspiration obtained from the hub of the microcatheter which was now in the distal M2 segment. A gentle arteriogram  performed through the microcatheter demonstrates safe position of the tip of the microcatheter with good antegrade flow. This was then connected to continuous heparinized saline infusion. A 3  mm x 32 mm Trevo ProVue retrieval device was then advanced to the distal end of the microcatheter. The retrieval device was deployed as mentioned earlier. The Catalyst guide catheter was advanced out of the orifice of the superior division. With proximal flow arrest in the left internal carotid artery, and constant aspiration applied at the hub of the balloon guide catheter, and at the hub of the 6 [place] Catalyst guide catheter with an aspiration pump for 2-1/2 minutes, the combination of the retrieval device, the microcatheter, and the aspiration catheter were retrieved and removed. Following reversal of flow arrest, control arteriogram performed through the balloon guide catheter in the left internal carotid artery demonstrated near complete revascularization of the left middle cerebral artery distribution achieving a TICI 2C plus revascularization. The left anterior cerebral artery remained widely patent. The balloon guide was then retrieved more proximally into the left common carotid artery. A control arteriogram performed at this time demonstrated wide patency of the left internal carotid artery extra cranially and intracranially. The previously noted ulceration at the proximal aspect of the left internal carotid remains stable. Patency was seen of the left middle cerebral artery with unchanged rapid perfusion of TICI 2C plus. The balloon guide was removed. The 8 French Pinnacle sheath was removed with successful hemostasis at the right groin with a 6 French Angio-Seal closure device. Distal pulses remained present in both feet unchanged. CT of the brain performed demonstrated no evidence of intracranial hemorrhage or mass effect. The patient was left intubated due to pre procedural global aphasia and difficulty with communication. Manual pressure was held at the right groin puncture site for hemostasis due to failure of the Angio-Seal closure device. Distal pulses remained present in both feet unchanged.  IMPRESSION: Status post endovascular revascularization of occluded superior division of the left middle cerebral artery with 1 pass with the 4 mm x 40 mm Solitaire X retrieval device and contact aspiration, and 1 pass with a 3 mm x 32 mm Trevo ProVue retriever and contact aspiration achieving a TICI 2C plus revascularization. PLAN: As per referring MD. Electronically Signed   By: Luanne Bras M.D.   On: 12/03/2020 17:46   VAS [place] LOWER EXTREMITY VENOUS (DVT)  Result Date: 12/28/2020  Lower Venous DVT Study Patient Name:  Candice Perez  Date of Exam:   12/28/2020 Medical Rec #: [id number]         Accession #:    [id number] Date of Birth: 05-14-1951         Patient Gender: F Patient Age:   33 years Exam Location:  [facility] Procedure:      VAS [place] LOWER EXTREMITY VENOUS (DVT) Referring Phys: Leeroy Cha --------------------------------------------------------------------------------  Indications: Pain.  Comparison Study: no prior Performing Technologist: Archie Patten RVS  Examination Guidelines: A complete evaluation includes B-mode imaging, spectral Doppler, color Doppler, and power Doppler as needed of all accessible portions of each vessel. Bilateral testing is considered an integral part of a complete examination. Limited examinations for reoccurring indications may be performed as noted. The reflux portion of the exam is performed with the patient in reverse Trendelenburg.  +-----+---------------+---------+-----------+----------+--------------+ RIGHTCompressibilityPhasicitySpontaneityPropertiesThrombus Aging +-----+---------------+---------+-----------+----------+--------------+ CFV  Full           Yes      Yes                                 +-----+---------------+---------+-----------+----------+--------------+   +---------+---------------+---------+-----------+----------+--------------+  LEFT     CompressibilityPhasicitySpontaneityPropertiesThrombus Aging  +---------+---------------+---------+-----------+----------+--------------+ CFV      Full           Yes      Yes                                 +---------+---------------+---------+-----------+----------+--------------+ SFJ      Full                                                        +---------+---------------+---------+-----------+----------+--------------+ FV Prox  Full                                                        +---------+---------------+---------+-----------+----------+--------------+ FV Mid   Full                                                        +---------+---------------+---------+-----------+----------+--------------+ FV DistalFull                                                        +---------+---------------+---------+-----------+----------+--------------+ PFV      Full                                                        +---------+---------------+---------+-----------+----------+--------------+ POP      Full           Yes      Yes                                 +---------+---------------+---------+-----------+----------+--------------+ PTV      Full                                                        +---------+---------------+---------+-----------+----------+--------------+ PERO     Full                                                        +---------+---------------+---------+-----------+----------+--------------+     Summary: RIGHT: - No evidence of common femoral vein obstruction.  LEFT: - There is no evidence of deep vein thrombosis in the lower extremity.  - No cystic structure found in the popliteal fossa.  *See table(s) above for measurements and observations.  Electronically signed by Deitra Mayo MD on 12/28/2020 at 4:43:55 PM.    Final    VAS [place] UPPER EXTREMITY VENOUS DUPLEX  Result Date: 12/08/2020 UPPER VENOUS STUDY  Patient Name:  Candice Perez  Date of Exam:   12/08/2020 Medical Rec #:  [id number]         Accession #:    [id number] Date of Birth: 17-Dec-1950         Patient Gender: F Patient Age:   70Y Exam Location[facility]  [facility] Procedure:      VAS [place] UPPER EXTREMITY VENOUS DUPLEX Referring Phys: [id number] Chrystie Nose PAYNE --------------------------------------------------------------------------------  Indications: "phlebitis" per MD order Limitations: Body habitus, poor ultrasound/tissue interface and immobility. Comparison Study: No previous exams Performing Technologist: Jody Hill RVT, RDMS  Examination Guidelines: A complete evaluation includes B-mode imaging, spectral Doppler, color Doppler, and power Doppler as needed of all accessible portions of each vessel. Bilateral testing is considered an integral part of a complete examination. Limited examinations for reoccurring indications may be performed as noted.  Right Findings: +----------+------------+---------+-----------+----------+---------------------+ RIGHT     CompressiblePhasicitySpontaneousProperties       Summary        +----------+------------+---------+-----------+----------+---------------------+ IJV           Full       Yes       Yes                                    +----------+------------+---------+-----------+----------+---------------------+ Subclavian    Full       Yes       Yes                                    +----------+------------+---------+-----------+----------+---------------------+ Axillary      Full       Yes       Yes                                    +----------+------------+---------+-----------+----------+---------------------+ Brachial                 Yes       Yes                Compression image                                                       lost, patent by color                                                          and doppler      +----------+------------+---------+-----------+----------+---------------------+ Radial        Full                                                         +----------+------------+---------+-----------+----------+---------------------+  Ulnar         Full                                                        +----------+------------+---------+-----------+----------+---------------------+ Cephalic      Full                                                        +----------+------------+---------+-----------+----------+---------------------+ Basilic       Full       Yes       Yes                                    +----------+------------+---------+-----------+----------+---------------------+  Left Findings: +----------+------------+---------+-----------+----------+---------------+ LEFT      CompressiblePhasicitySpontaneousProperties    Summary     +----------+------------+---------+-----------+----------+---------------+ IJV           Full       Yes       Yes                              +----------+------------+---------+-----------+----------+---------------+ Subclavian    Full       Yes       Yes                              +----------+------------+---------+-----------+----------+---------------+ Axillary      Full       Yes       Yes                              +----------+------------+---------+-----------+----------+---------------+ Brachial      Full       Yes       Yes                              +----------+------------+---------+-----------+----------+---------------+ Radial        Full                                                  +----------+------------+---------+-----------+----------+---------------+ Ulnar         Full                                                  +----------+------------+---------+-----------+----------+---------------+ Cephalic      None       No        No               Forearm - Acute +----------+------------+---------+-----------+----------+---------------+ Basilic       Full       Yes       Yes                               +----------+------------+---------+-----------+----------+---------------+  Summary:  Right: No evidence of deep vein thrombosis in the upper extremity. No evidence of superficial vein thrombosis in the upper extremity.  Left: No evidence of deep vein thrombosis in the upper extremity. Findings consistent with acute superficial vein thrombosis involving the left cephalic vein at the level of the forearm.  *See table(s) above for measurements and observations.  Diagnosing physician: Jamelle Haring Electronically signed by Jamelle Haring on 12/08/2020 at 3:56:38 PM.    Final     Labs:  Basic Metabolic Panel: No results for input(s): NA, K, CL, CO2, GLUCOSE, BUN, CREATININE, CALCIUM, MG, PHOS in the last 168 hours.  CBC: No results for input(s): WBC, NEUTROABS, HGB, HCT, MCV, PLT in the last 168 hours.  CBG: Recent Labs  Lab 12/29/20 1159  GLUCAP 118*    Family history.  Mother with congestive heart failure as well as tongue cancer.  Denies any colon cancer esophageal cancer or rectal cancer  Brief HPI:   Candice Perez is a 70 y.o. right-handed female with history of hypertension hyperlipidemia prior TIA maintained on Plavix 05/2017 history of DVT quit smoking 10 years ago.  Per chart review lives with spouse.  Presented to [facility] 12[facility]/2020 with new onset of atrial fibrillation with RVR and placed on Eliquis.  Patient with acute onset right side weakness and aphasia in the ED code stroke was activated CTA revealed LVO with occlusion of the superior division of the left middle cerebral artery.  She was transferred to [facility] for emergent mechanical thrombectomy per interventional radiology.  Follow-up MRI of the brain showed small to moderate acute cortical infarct in the left frontal parietal cortex primarily in the postcentral gyrus.  No associated hemorrhage.  Cardiology service follow-up for atrial fibrillation with RVR with latest echocardiogram  ejection fraction of 40 to [id number] grade 1 diastolic dysfunction.  The left ventricle demonstrated global hypokinesis.  Patient remained on Eliquis for CVA prophylaxis as well as atrial fibrillation.  Amiodarone ongoing for atrial fibrillation as well as Lanoxin with Cardizem.  Upper extremity Dopplers negative for DVT.  Hospital course mild AKI 1.32 and monitored.  Admission with nasogastric tube for nutritional support with diet slowly advanced.  Therapy evaluations completed due to patient's right side weakness was admitted for a comprehensive rehab program.   Hospital Course: Candice Perez was admitted to rehab 12/09/2020 for inpatient therapies to consist of PT, ST and OT at least three hours five days a week. Past admission physiatrist, therapy team and rehab RN have worked together to provide customized collaborative inpatient rehab.  Pertaining to patient's left MCA infarction due to left MCA occlusion status post thrombectomy remained on Eliquis therapy and would follow neurology services.  New onset atrial fibrillation cardiac rate controlled follow-up cardiology services amiodarone adjusted accordingly.  Synthroid ongoing for hypothyroidism.  Her diet was slowly advanced to mechanical soft thin liquid.  Crestor for hyperlipidemia.  Morbid obesity with BMI 45.75 dietary follow-up.  AKI/prerenal with latest creatinine stable 1.16.  Prediabetes hemoglobin A1c 6.2 provided dietary education.  Patient was treated for an E. coli UTI Macrodantin 7 days during her hospital course no dysuria or hematuria.  As patient was moving towards discharge her husband and family members were diagnosed COVID-positive thus she was also tested showing to be positive however asymptomatic placed on contact precautions   Blood pressures were monitored on TID basis and soft and monitored  Diabetes has been monitored with ac/hs CBG checks and SSI was use prn for  tighter BS control.    Rehab course: During patient's stay in  rehab weekly team conferences were held to monitor patient's progress, set goals and discuss barriers to discharge. At admission, patient required moderate assist stand pivot transfers modified independent overall bed mobility max is lower body bathing moderate assist grooming  Physical exam.  Blood pressure 135/83 pulse 79 temperature 99 respirations 18 oxygen saturation 97% room air Constitutional.  No acute distress HEENT Head.  Right facial droop. Eyes.  Pupils round and reactive to light no discharge without nystagmus Neck.  Supple nontender no JVD without thyromegaly Cardiac regular rate and rhythm any extra sounds or murmur heard Abdomen.  Soft nontender positive bowel sounds without rebound Respiratory effort normal no respiratory distress without wheeze Musculoskeletal.  Normal range of motion neck supple Comments.  Right upper extremity biceps 4 -/5 triceps 3+/5 wrist extension/grip 2/5 and FA 1/5 Left upper extremity 5/5 in same muscles tested Right lower extremity hip flexion 3+/5 knee extension dorsiflexion knee flexion plantar flexion 4+/5 Left lower extremity 5/5 in same muscles Neurologic.  Alert expressive aphasia follows simple commands with significant paraphasic errors.  She does have some difficulty with spontaneous speech.   He/She  has had improvement in activity tolerance, balance, postural control as well as ability to compensate for deficits. He/She has had improvement in functional use RUE/LUE  and RLE/LLE as well as improvement in awareness.  Patient ambulates to the shower and step over threshold with contact-guard.  Completed upper body dressing and set up lower body dressing with minimal assist upper lower body bathing with minimal assist sit to stand and shower bench using grab bars.  Patient stepped out of shower satin 3 1 commode contact-guard assist.  Educated husband on body mechanics with assisting patient with donning footwear due to reported he gets  lightheaded if he bends over.  She ambulates 80 to 150 feet with contact-guard assist wide-based quad cane.  Speech therapy follow-up for dysphagia and textures as indicated.  Patient demonstrated 80 to 85% intelligibility in conversation sentence level due to articulation and word finding areas with ability to correct articulation given errors given minimal assist verbal visual cues.  Full family teaching completed plan discharged to home       Disposition: Discharged to home    Diet: Mechanical soft  Special Instructions: No driving smoking or alcohol  Medications at discharge 1.  Tylenol as needed 2.  Amiodarone 200 mg p.o. daily 3.  Eliquis 5 mg p.o. twice daily 4.  Voltaren gel 2 g 4 times daily to affected area 5.  Lanoxin 0.125 mg p.o. daily 6.  Cardizem CD 240 mg p.o. daily 7.  Neurontin 400 mg p.o. nightly 8.  Synthroid 100 mcg p.o. daily 9.  Lopressor 50 mg p.o. 3 times daily 10.  MiraLAX daily 11.  Crestor 20 mg p.o. daily 12.  Macrobid 100 mg p.o. every 12 hours x43days and stop 13.  FiberCon daily 14.  Pyridium 200 mg 3 times daily x3 days 15.  Prednisone taper as directed  Discharge Instructions     Ambulatory referral to Neurology   Complete by: As directed    An appointment is requested in approximately: 4 weeks left MCA infarction   Ambulatory referral to Physical Medicine Rehab   Complete by: As directed    Moderate complexity follow-up 1 to 2 weeks left MCA infarction        Follow-up Information     Raulkar, Clide Deutscher, MD Follow up.  Specialty: Physical Medicine and Rehabilitation Why: 01/12/21 please arrive at 9:50am for 10:20am appointment for Qutenza patch application for numbness in her feet Contact information: [address] [place] [address] [phone]         Dixie Dials, MD Follow up.   Specialty: Cardiology Why: Call for appointment Contact information: [place]  [id number] [phone]         Luanne Bras, MD Follow up.   Specialties: Interventional Radiology, Radiology Why: Call for appointment Contact information: [place]  [id number] [phone]                 Signed: Cathlyn Parsons 12/29/2020, 12:09 PM

## 2020-12-27 LAB — URINE CULTURE: Culture: >=100000 — AB

## 2020-12-27 LAB — SARS CORONAVIRUS 2 (TAT 6-24 HRS): SARS Coronavirus 2: POSITIVE — AB

## 2020-12-27 MED ORDER — ROSUVASTATIN CALCIUM 20 MG PO TABS: 20.0000 mg | ORAL_TABLET | Freq: Every day | ORAL | 0 refills | Status: DC

## 2020-12-27 MED ORDER — LEVOTHYROXINE SODIUM 100 MCG PO TABS: 100.0000 ug | ORAL_TABLET | Freq: Every day | ORAL | 0 refills | Status: DC

## 2020-12-27 MED ORDER — DIGOXIN 125 MCG PO TABS: 0.1250 mg | ORAL_TABLET | Freq: Every day | ORAL | 0 refills | Status: DC

## 2020-12-27 MED ORDER — NITROFURANTOIN MONOHYD MACRO 100 MG PO CAPS: 100.0000 mg | ORAL_CAPSULE | Freq: Two times a day (BID) | ORAL | 0 refills | Status: DC

## 2020-12-27 MED ORDER — DILTIAZEM HCL ER COATED BEADS 240 MG PO CP24: 240.0000 mg | ORAL_CAPSULE | Freq: Every day | ORAL | 0 refills | Status: DC

## 2020-12-27 MED ORDER — APIXABAN 5 MG PO TABS: 5.0000 mg | ORAL_TABLET | Freq: Two times a day (BID) | ORAL | 0 refills | Status: AC

## 2020-12-27 MED ORDER — METOPROLOL TARTRATE 75 MG PO TABS: 62.5000 mg | ORAL_TABLET | Freq: Three times a day (TID) | ORAL | 0 refills | Status: DC

## 2020-12-27 MED ORDER — DICLOFENAC SODIUM 1 % EX GEL: 2.0000 g | Freq: Four times a day (QID) | CUTANEOUS | 0 refills | Status: DC

## 2020-12-27 MED ORDER — POLYETHYLENE GLYCOL 3350 17 G PO PACK: 17.0000 g | PACK | Freq: Every day | ORAL | 0 refills | Status: DC

## 2020-12-27 MED ORDER — PHENAZOPYRIDINE HCL 200 MG PO TABS
200.0000 mg | ORAL_TABLET | Freq: Three times a day (TID) | ORAL | Status: DC
  Administered 2020-12-27 – 2020-12-30 (×8): 200 mg via ORAL
  Filled 2020-12-27 (×11): qty 1

## 2020-12-27 MED ORDER — GABAPENTIN 400 MG PO CAPS
400.0000 mg | ORAL_CAPSULE | Freq: Every day | ORAL | 0 refills | Status: DC
Start: 2020-12-27 — End: 2021-03-29

## 2020-12-27 MED ORDER — METOPROLOL TARTRATE 50 MG PO TABS
50.0000 mg | ORAL_TABLET | Freq: Three times a day (TID) | ORAL | Status: DC
  Administered 2020-12-27 – 2020-12-30 (×8): 50 mg via ORAL
  Filled 2020-12-27 (×8): qty 1

## 2020-12-27 MED ORDER — AMIODARONE HCL 200 MG PO TABS: 200.0000 mg | ORAL_TABLET | Freq: Every day | ORAL | 0 refills | Status: DC

## 2020-12-27 NOTE — Progress Notes (Signed)
Patient ID: Candice Perez, female   DOB: November 26, 1950, 70 y.o.   MRN: [id number] Met with pt who informed worker husband is here in the hospital with COVID was admitted Sat when here with her. Their son also tested positive this am. Pt was suppose to go home tomorrow but husband is her caregiver and she has no one else at home. Will extend discharge until husband home and able to care for her. Pt reports not tested yet but feeling fine.

## 2020-12-27 NOTE — Discharge Summary (Signed)
Physical Therapy Discharge Summary  Patient Details  Name: Candice Perez MRN: [id number] Date of Birth: [birth year]  Patient has met 11 of 12 long term goals due to improved activity tolerance, improved balance, improved postural control, increased strength, ability to compensate for deficits, functional use of  right upper extremity and right lower extremity, improved attention, improved awareness, and improved coordination.  Patient to discharge at an ambulatory level Supervision.   Patient's care partner is independent to provide the necessary physical and cognitive assistance at discharge.   Of note, 1 day before original scheduled discharge date (8/16), pt tested positive for COVID (exposed from husband on Friday (8/12) during family ed/training). This resulted in a delayed discharge and a few missed days of therapy and was limited to therapy in the room only to avoid exposing other staff/patients. During this delay, she developed L foot pain of unknown origin that impacted her functional mobility, resulting in antalgic gait and decreased tolerance to weight bearing. Doppler studies were negative and she was being treated with prednisone and voltaren topical ointment.   Reasons goals not met: w/c mobility goal not addressed as pt is a functional ambulator.  Recommendation:  Patient will benefit from ongoing skilled PT services in home health setting to continue to advance safe functional mobility, address ongoing impairments in R hemibody weakness, decreased activity tolerance, global deconditioning, dynamic standing ballance, and gait impairments to minimize fall risk.  Equipment: Large based quad cane  Reasons for discharge: treatment goals met and discharge from hospital  Patient/family agrees with progress made and goals achieved: Yes. Family education/training performed prior to DC with husband.  PT Discharge Precautions/Restrictions Precautions Precautions: Fall Precaution  Comments: R Hemi with sensory deficits (RUE > RLE) Restrictions Weight Bearing Restrictions: No Vital Signs Therapy Vitals Temp: 97.7 F (36.5 C) Temp Source: Oral Pulse Rate: (!) 55 Resp: 18 BP: 112/70 Patient Position (if appropriate): Lying Oxygen Therapy SpO2: 96 % O2 Device: Room Air Vision/Perception  Vision - Assessment Eye Alignment: Within Functional Limits Ocular Range of Motion: Within Functional Limits Alignment/Gaze Preference: Within Defined Limits Perception Perception: Within Functional Limits Praxis Praxis: Intact  Cognition Overall Cognitive Status: Within Functional Limits for tasks assessed Arousal/Alertness: Awake/alert Orientation Level: Oriented X4 Attention: Sustained;Selective;Focused Focused Attention: Appears intact Sustained Attention: Appears intact Selective Attention: Appears intact Selective Attention Impairment: Verbal basic;Functional basic Problem Solving: Appears intact Safety/Judgment: Appears intact Sensation Coordination Gross Motor Movements are Fluid and Coordinated: No Fine Motor Movements are Fluid and Coordinated: No Coordination and Movement Description: Impacted by hemiplegia and body habitus. Sensory deficits on RUE impacting as well. Very effortful movement patterns Heel Shin Test: Limited by hip weakness and body habitus Motor  Motor Motor: Hemiplegia Motor - Discharge Observations: Improved functional strength since date of evaluation  Mobility Bed Mobility Bed Mobility: Rolling Right;Rolling Left;Sit to Supine;Supine to Sit Rolling Right: Independent Rolling Left: Independent Supine to Sit: Independent Sit to Supine: Independent Transfers Transfers: Sit to Stand;Stand to Sit;Stand Pivot Transfers Sit to Stand: Supervision/Verbal cueing Stand to Sit: Supervision/Verbal cueing Stand Pivot Transfers: Supervision/Verbal cueing Stand Pivot Transfer Details: Verbal cues for safe use of DME/AE;Verbal cues for  precautions/safety;Verbal cues for technique Transfer (Assistive device): Large base quad cane Locomotion  Gait Ambulation: Yes Gait Assistance: Supervision/Verbal cueing Gait Distance (Feet): 165 Feet Assistive device: Large base quad cane Gait Assistance Details: Verbal cues for gait pattern;Verbal cues for safe use of DME/AE;Verbal cues for technique;Verbal cues for precautions/safety Gait Gait: Yes Gait Pattern: Impaired Gait Pattern: Step-to  pattern;Decreased step length - right;Decreased stance time - right;Decreased weight shift to left;Trunk flexed;Wide base of support;Poor foot clearance - right;Lateral trunk lean to left;Right hip hike Gait velocity: decreased Stairs / Additional Locomotion Stairs: Yes Stairs Assistance: Contact Guard/Touching assist Stair Management Technique: Two rails Number of Stairs: 16 Height of Stairs: 3 (inches) Ramp: Contact Guard/touching assist Curb: Contact Guard/Touching assist Wheelchair Mobility Wheelchair Mobility: No  Trunk/Postural Assessment  Cervical Assessment Cervical Assessment: Within Functional Limits Thoracic Assessment Thoracic Assessment: Exceptions to [facility] (rounded shoulders and some development of kyphosis) Lumbar Assessment Lumbar Assessment: Exceptions to [facility] (pposterior pelvic tilt) Postural Control Postural Control: Within Functional Limits  Balance Balance Balance Assessed: Yes Static Sitting Balance Static Sitting - Level of Assistance: 7: Independent Dynamic Sitting Balance Dynamic Sitting - Level of Assistance: 5: Stand by assistance Static Standing Balance Static Standing - Level of Assistance: 5: Stand by assistance Dynamic Standing Balance Dynamic Standing - Level of Assistance: 4: Min assist  Extremity Assessment  RLE Assessment RLE Assessment: Exceptions to [facility] General Strength Comments: Grossly 4/5 LLE Assessment LLE Assessment: Within Functional Limits    Kurt Hoffmeier P Jan Walters PT, DPT 12/27/2020,  7:40 AM

## 2020-12-27 NOTE — Progress Notes (Signed)
PROGRESS NOTE   Subjective/Complaints: Husband admitted to the hospital for [place] with expected discharge tomorrow. Son also positive for COVID. Testing patient for COVID today though she denies respiratory symptoms  ROS: Denies CP, SOB, N/V/D, +dysuria  Objective:   No results found. No results for input(s): WBC, HGB, HCT, PLT in the last 72 hours.  No results for input(s): NA, K, CL, CO2, GLUCOSE, BUN, CREATININE, CALCIUM in the last 72 hours.   Intake/Output Summary (Last 24 hours) at 12/27/2020 1442 Last data filed at 12/27/2020 [id number] Gross per 24 hour  Intake 720 ml  Output 500 ml  Net 220 ml        Physical Exam: Vital Signs Blood pressure 109/60, pulse (!) 45, temperature 98.2 F (36.8 C), resp. rate 19, height '5\' 7"'$  (1.702 m), weight 120.6 kg, SpO2 97 %. Constitutional: No distress . Vital signs reviewed. HENT: Normocephalic.  Atraumatic. Eyes: EOMI. No discharge. Cardiovascular: Bradycardia Respiratory: Normal effort.  No stridor.  Bilateral clear to auscultation. GI: Non-distended.  BS +. Skin: Warm and dry.  Intact. Psych: Normal mood.  Normal behavior. Musc: Lower extremity edema..  No tenderness in extremities. Neuro: Alert Neuro:.:  Shoulder abduction, elbow flexion/extension 4 -/5, handgrip 1+/5  Assessment/Plan: 1. Functional deficits which require 3+ hours per day of interdisciplinary therapy in a comprehensive inpatient rehab setting. Physiatrist is providing close team supervision and 24 hour management of active medical problems listed below. Physiatrist and rehab team continue to assess barriers to discharge/monitor patient progress toward functional and medical goals  Care Tool:  Bathing    Body parts bathed by patient: Right arm, Left arm, Chest, Abdomen, Front perineal area, Right upper leg, Left upper leg, Face, Buttocks   Body parts bathed by helper: Right lower leg, Left lower  leg Body parts n/a: Buttocks   Bathing assist Assist Level: Minimal Assistance - Patient > 75%     Upper Body Dressing/Undressing Upper body dressing   What is the patient wearing?: Pull over shirt    Upper body assist Assist Level: Set up assist    Lower Body Dressing/Undressing Lower body dressing      What is the patient wearing?: Incontinence brief, Pants     Lower body assist Assist for lower body dressing: Minimal Assistance - Patient > 75%     Toileting Toileting    Toileting assist Assist for toileting: Moderate Assistance - Patient 50 - 74%     Transfers Chair/bed transfer  Transfers assist     Chair/bed transfer assist level: Contact Guard/Touching assist     Locomotion Ambulation   Ambulation assist      Assist level: Contact Guard/Touching assist Assistive device: Cane-quad Max distance: 85'   Walk 10 feet activity   Assist     Assist level: Contact Guard/Touching assist Assistive device: Cane-quad   Walk 50 feet activity   Assist Walk 50 feet with 2 turns activity did not occur: Safety/medical concerns  Assist level: Contact Guard/Touching assist Assistive device: Cane-quad    Walk 150 feet activity   Assist Walk 150 feet activity did not occur: Safety/medical concerns (fatigue)         Walk 10 feet on  uneven surface  activity   Assist Walk 10 feet on uneven surfaces activity did not occur: Safety/medical concerns         Wheelchair     Assist Will patient use wheelchair at discharge?: No   Wheelchair activity did not occur: N/A         Wheelchair 50 feet with 2 turns activity    Assist    Wheelchair 50 feet with 2 turns activity did not occur: N/A       Wheelchair 150 feet activity     Assist  Wheelchair 150 feet activity did not occur: N/A       Blood pressure 109/60, pulse (!) 45, temperature 98.2 F (36.8 C), resp. rate 19, height '5\' 7"'$  (1.702 m), weight 120.6 kg, SpO2 97  %.  Medical Problem List and Plan: 1.  Right side hemiparesis and aphasia secondary to left MCA infarct due to left MCA occlusion status post thrombectomy with TIC12c etiology likely due to newly diagnosed atrial fibrillation  Continue CIR 2.  New onset atrial fibrillation: continue Eliquis, Amiodarone 200 mg daily, Lanoxin 0.125 mg daily, Cardizem 240 mg daily, decrease lopressor to 50 mg TID. Magnesium level reviewed and stable.  3. Hand joint OA: voltaren gel ordered with improvement. Placed nursing order to indicate that patient may use her home tart cherry supplement three times per day- we were unable to get this back from pharmacy- family will bring in new bottle and discussed with nursing to leave this at her bedside  -Also reviewed the following additional supplement which can be helpful for her OA: OMEGA 3 FATTY ACIDS, TURMERIC, GINGER,  CELERY SEED, GLUCOSAMINE WITH CHONDROITIN   4. Mood: Provide emotional support             -antipsychotic agents: N/A 5. Neuropsych: This patient is not ? capable of making decisions on her own behalf. 6. Skin/Wound Care: Local measures as needed RUE.  7. Fluids/Electrolytes/Nutrition: encourage appropriate PO 8.  New onset atrial fibrillation.   9.  Hypothyroidism.  Continue Synthroid 10.  Dysphagia.  Dysphagia #2 thin liquids.  adv per SLP, watch for pocketing  Advance diet as tolerated 11.  Hyperlipidemia.  Continue Crestor 12.  Morbid obesity.  BMI 45.75.  Dietary follow-up. Discontinue Ensure 13.  AKI/prerenal.   -encourage PO fluids  -labs look better  14. HTN: increase lopressor to '50mg'$  TID, continue to monitor for effects of   Controlled on 8/14, monitor for hypotension 15. Prediabetes HgbA1c 6.2- d/c ensure. Schedule for outpatient Qutenza for diabetic peripheral neuropathy. Provided dietary education 17. Wrist drop: resting hand splint ordered.  18. Insomnia: related to right hand OA-   '300mg'$  Gabapentin HS- helped 19. Tachycardia:  decrease lopressor to '50mg'$  TID Improving  20. Rash bilateral arms and back: discussed using hypoallergenic sheets with nursing, improved- discontinue hydrocortisone. Medications reviewed- none new 21. Polypharamcy: all medications previously reviewed.  22. Aphasia: continue working on reading comprehension, expressive aphasia with SLP 23. Phelgm production: mucinex started. Ordered incentive spirometer and out of bed for meals and educated patient regarding benefits of these to prevent atelectasis and pneumonia.   COVID test ordered 24.  Acute lower UTI  E. coli, sensitivities pending  Empiric Macrobid started on 8/13 25. Left lower extremity edema: chronic, orders placed ro elevate    LOS: 18 days A FACE TO FACE EVALUATION WAS PERFORMED  Martha Clan P Nikiyah Fackler 12/27/2020, 2:42 PM

## 2020-12-27 NOTE — Progress Notes (Signed)
SLP Cancellation Note  Patient Details Name: Candice Perez MRN: [id number] DOB: [birth year]   Cancelled treatment:        Pt missed 45 minutes of skilled ST services. Pt is currently under investigation for COVID-19 after being exposed from her husband over the weekend and waiting results. This therapist is unable to wear N-95 and unable to locate CAPER. Will re-attempt as schedule permits.                                                                                                 Xachary Hambly  [facility] 12/27/2020, 3:02 PM

## 2020-12-27 NOTE — Progress Notes (Signed)
Occupational Therapy Session Note  Patient Details  Name: Candice Perez MRN: [id number] Date of Birth: [birth year]  Today's Date: 12/27/2020 OT Individual Time: 2111-5520 OT Individual Time Calculation (min): 35 min    Short Term Goals: Week 1:  OT Short Term Goal 1 (Week 1): Pt will don shirt with min A. OT Short Term Goal 1 - Progress (Week 1): Met OT Short Term Goal 2 (Week 1): Pt will sit to stand with min to prep for toilet transfer. OT Short Term Goal 2 - Progress (Week 1): Met OT Short Term Goal 3 (Week 1): Pt will complete toileting with mod A. OT Short Term Goal 3 - Progress (Week 1): Met OT Short Term Goal 4 (Week 1): Pt will don pants with mod A. OT Short Term Goal 4 - Progress (Week 1): Met OT Short Term Goal 5 (Week 1): Pt will use RUE to wash LUE with guiding A. OT Short Term Goal 5 - Progress (Week 1): Met Week 2:  OT Short Term Goal 1 (Week 2): Pt will complete toilet transfers with Supervision. OT Short Term Goal 2 (Week 2): Pt will complete toileting with min A. OT Short Term Goal 3 (Week 2): Pt will don pants with min A.   Skilled Therapeutic Interventions/Progress Updates:    Pt greeted at time of session semireclined in bed, therapist donning PPE prior to session. Pt stating she had on Purewick but did not like it, wanting to use bathroom. Supine > sit Supervision and noted soiled brief with BM and urine, disposed of soiled Purewick. Ambulated bed > bathroom with increased lean with Min A heavy HHA with LUE using quad cane. Transferred to toilet same manner. Extended time for cleaning buttocks and periarea d/t loose BM, pt both in sitting and standing. Seated oral hygiene and hand washing wheelchair level at sink before stand pivot to bed same manner as above with quad cane. Alarm on call bell in reach.   Therapy Documentation Precautions:  Precautions Precautions: Fall Precaution Comments: R Hemi with sensory deficits (RUE > RLE) Restrictions Weight Bearing  Restrictions: No Other Position/Activity Restrictions: Right Hemiplegia     Therapy/Group: Individual Therapy  Viona Gilmore 12/27/2020, 12:55 PM

## 2020-12-27 NOTE — Progress Notes (Signed)
Physical Therapy Session Note  Patient Details  Name: TAIRA NEMER MRN: [id number] Date of Birth: Nov 24, 1950  Pt missed 60 minutes of skilled therapy. Currently a person under investigation for COVID-19 after being exposed from her husband over the weekend. Awaiting test results as pt is requiring to stay in room until results are known. This therapist is unable to wear N-95 and unable to locate CAPER. Will re-attempt as schedule permits. Notified CSW regarding DC planning as pt's target DC was 8/16.  Therapy Documentation Precautions:  Precautions Precautions: Fall Restrictions Weight Bearing Restrictions: No Other Position/Activity Restrictions: Right Hemiplegia General:    Therapy/Group: Individual Therapy  Alger Simons 12/27/2020, 7:33 AM

## 2020-12-28 ENCOUNTER — Inpatient Hospital Stay (HOSPITAL_COMMUNITY): Payer: Medicare HMO

## 2020-12-28 DIAGNOSIS — M79605 Pain in left leg: Secondary | ICD-10-CM

## 2020-12-28 MED ORDER — CALCIUM POLYCARBOPHIL 625 MG PO TABS: 625.0000 mg | ORAL_TABLET | Freq: Every day | ORAL | 0 refills | Status: DC

## 2020-12-28 MED ORDER — CALCIUM POLYCARBOPHIL 625 MG PO TABS
625.0000 mg | ORAL_TABLET | Freq: Every day | ORAL | Status: DC
  Administered 2020-12-28 – 2020-12-30 (×3): 625 mg via ORAL
  Filled 2020-12-28 (×3): qty 1

## 2020-12-28 MED ORDER — METOPROLOL TARTRATE 50 MG PO TABS: 50.0000 mg | ORAL_TABLET | Freq: Three times a day (TID) | ORAL | 0 refills | Status: DC

## 2020-12-28 MED ORDER — PHENAZOPYRIDINE HCL 200 MG PO TABS: 200.0000 mg | ORAL_TABLET | Freq: Three times a day (TID) | ORAL | 0 refills | Status: DC

## 2020-12-28 NOTE — Patient Care Conference (Signed)
Inpatient RehabilitationTeam Conference and Plan of Care Update Date: 12/28/2020   Time: 13:16 PM    Patient Name: Candice Perez      Medical Record Number: [id number]  Date of Birth: [birth year] Sex: Female         Room/Bed: [id number] Payor Info: Payor: [employer] MEDICARE / Plan: [employer] MEDICARE HMO/PPO / Product Type: *No Product type* /    Admit Date/Time:  12/09/2020  4:01 PM  Primary Diagnosis:  Left middle cerebral artery stroke [facility])  Hospital Problems: Principal Problem:   Left middle cerebral artery stroke ([place]) Active Problems:   Right hemiplegia ([place])   Right hemiparesis ([place])   Essential hypertension   Sinus tachycardia   Acute lower UTI    Expected Discharge Date: Expected Discharge Date:  (Pending)  Team Members Present: Physician leading conference: Dr. Leeroy Cha Social Worker Present: Ovidio Kin, LCSW Nurse Present: Dorien Chihuahua, RN PT Present: Ginnie Smart, PT OT Present: Willeen Cass, OT SLP Present: Charolett Bumpers, SLP PPS Coordinator present : Ileana Ladd, PT     Current Status/Progress Goal Weekly Team Focus  Bowel/Bladder             Swallow/Nutrition/ Hydration   Dys 3 textures and thin liquids, Sup A - mod I  Mod I  trials of regular textures   ADL's   on target wtih goals; fam educ completed; pt now COVID positive delaying d/c - qd services  CGA  continue with services ADL traininig   Mobility   supervision bed mobility, gait ~163f with close supervision to CGA and large based quad cane, transfers supervision with quad cane.  supervision  At goal level - supervision. DC planning now that pt is COVID + and so is her Husband (caretaker). Continue global strengthening, gait training, and endurance training   Communication   Min A  sup-to-min A  articulation placement and repairs   Safety/Cognition/ Behavioral Observations  min-supervision A, difficulity with 3 step directions  Sup A  recall and higher level awareness    Pain             Skin               Discharge Planning:  Husband has been admitted to the hospital with CVOID son and pt also are positive. HUsband is pt's caregiver will need him to be home prior to discharging pt home   Team Discussion: Patient with COVID; spouse hospitalized and son also with COVID and unable to provide assistance for discharge.  BP stable, UTI treated Loose stools also treated. Edema left LE and left foot pain addressed.  Patient on target to meet rehab goals: yes, currently supervision for bed mobility and CGA for transfers using a quad cane and can ambulate up to 40-50'. At goal level for OT. Requires supervision for communications and reading.  *See Care Plan and progress notes for long and short-term goals.   Revisions to Treatment Plan:  Transition to q day therapy schedule Working on error awareness, basic problem solving  Teaching Needs: Family education completed 12/24/20  Current Barriers to Discharge: Decreased caregiver support and Home enviroment access/layout  Possible Resolutions to Barriers: OP follow up services recommended     Medical Summary Current Status: left foot pain and swelling, right hand pain and weakness, cough, dysuria  Barriers to Discharge: Medical stability;Wound care;Decreased family/caregiver support  Barriers to Discharge Comments: left foot pain and swelling, right hand pain and weakness, cough, dysuria Possible Resolutions to [employer]: vascular  ultrasound left foot, continue macrobid, continue COVID precautions, decrease Lopressor dose, decrease therapy to QD   Continued Need for Acute Rehabilitation Level of Care: The patient requires daily medical management by a physician with specialized training in physical medicine and rehabilitation for the following reasons: Direction of a multidisciplinary physical rehabilitation program to maximize functional independence : Yes Medical management of patient  stability for increased activity during participation in an intensive rehabilitation regime.: Yes Analysis of laboratory values and/or radiology reports with any subsequent need for medication adjustment and/or medical intervention. : Yes   I attest that I was present, lead the team conference, and concur with the assessment and plan of the team.   Dorien Chihuahua B 12/28/2020, 3:51 PM

## 2020-12-28 NOTE — Progress Notes (Signed)
PROGRESS NOTE   Subjective/Complaints: C/o of left foot pain with walking that is new for her She denies any respiratory symptoms She says her husband is doing well, will be discharged from the hospital today  ROS: Denies CP, SOB, N/V/D, +dysuria, +left foot pain  Objective:   No results found. No results for input(s): WBC, HGB, HCT, PLT in the last 72 hours.  No results for input(s): NA, K, CL, CO2, GLUCOSE, BUN, CREATININE, CALCIUM in the last 72 hours.   Intake/Output Summary (Last 24 hours) at 12/28/2020 1030 Last data filed at 12/28/2020 0857 Gross per 24 hour  Intake 480 ml  Output --  Net 480 ml        Physical Exam: Vital Signs Blood pressure 136/88, pulse 73, temperature 98.6 F (37 C), temperature source Oral, resp. rate 18, height '5\' 7"'$  (1.702 m), weight 120.6 kg, SpO2 96 %. Constitutional: No distress . Vital signs reviewed. HENT: Normocephalic.  Atraumatic. Eyes: EOMI. No discharge. Cardiovascular: Bradycardia Respiratory: Normal effort.  No stridor.  Bilateral clear to auscultation. GI: Non-distended.  BS +. Skin: Warm and dry.  Intact. Psych: Normal mood.  Normal behavior. Musc: Lower extremity edema.. TTP over her left foot Neuro: Alert Neuro:.:  Shoulder abduction, elbow flexion/extension 4 -/5, handgrip 1+/5  Assessment/Plan: 1. Functional deficits which require 3+ hours per day of interdisciplinary therapy in a comprehensive inpatient rehab setting. Physiatrist is providing close team supervision and 24 hour management of active medical problems listed below. Physiatrist and rehab team continue to assess barriers to discharge/monitor patient progress toward functional and medical goals  Care Tool:  Bathing    Body parts bathed by patient: Right arm, Left arm, Chest, Abdomen, Front perineal area, Right upper leg, Left upper leg, Face, Buttocks   Body parts bathed by helper: Right lower leg,  Left lower leg Body parts n/a: Buttocks   Bathing assist Assist Level: Minimal Assistance - Patient > 75%     Upper Body Dressing/Undressing Upper body dressing   What is the patient wearing?: Pull over shirt    Upper body assist Assist Level: Set up assist    Lower Body Dressing/Undressing Lower body dressing      What is the patient wearing?: Incontinence brief, Pants     Lower body assist Assist for lower body dressing: Minimal Assistance - Patient > 75%     Toileting Toileting    Toileting assist Assist for toileting: Maximal Assistance - Patient 25 - 49%     Transfers Chair/bed transfer  Transfers assist     Chair/bed transfer assist level: Contact Guard/Touching assist     Locomotion Ambulation   Ambulation assist      Assist level: Contact Guard/Touching assist Assistive device: Cane-quad Max distance: 23f   Walk 10 feet activity   Assist     Assist level: Contact Guard/Touching assist Assistive device: Cane-quad   Walk 50 feet activity   Assist Walk 50 feet with 2 turns activity did not occur: Safety/medical concerns  Assist level: Contact Guard/Touching assist Assistive device: Cane-quad    Walk 150 feet activity   Assist Walk 150 feet activity did not occur: Safety/medical concerns (fatigue)  Walk 10 feet on uneven surface  activity   Assist Walk 10 feet on uneven surfaces activity did not occur: Safety/medical concerns         Wheelchair     Assist Will patient use wheelchair at discharge?: No   Wheelchair activity did not occur: N/A         Wheelchair 50 feet with 2 turns activity    Assist    Wheelchair 50 feet with 2 turns activity did not occur: N/A       Wheelchair 150 feet activity     Assist  Wheelchair 150 feet activity did not occur: N/A       Blood pressure 136/88, pulse 73, temperature 98.6 F (37 C), temperature source Oral, resp. rate 18, height '5\' 7"'$  (1.702 m),  weight 120.6 kg, SpO2 96 %.  Medical Problem List and Plan: 1.  Right side hemiparesis and aphasia secondary to left MCA infarct due to left MCA occlusion status post thrombectomy with TIC12c etiology likely due to newly diagnosed atrial fibrillation  Continue CIR  -Interdisciplinary Team Conference today   2.  New onset atrial fibrillation: continue Eliquis, Amiodarone 200 mg daily, Lanoxin 0.125 mg daily, Cardizem 240 mg daily, decrease lopressor to 50 mg TID. Magnesium level reviewed and stable.  3. Hand joint OA: voltaren gel ordered with improvement. Placed nursing order to indicate that patient may use her home tart cherry supplement three times per day- we were unable to get this back from pharmacy- family will bring in new bottle and discussed with nursing to leave this at her bedside  -Also reviewed the following additional supplement which can be helpful for her OA: OMEGA 3 FATTY ACIDS, TURMERIC, GINGER,  CELERY SEED, GLUCOSAMINE WITH CHONDROITIN   4. Mood: Provide emotional support             -antipsychotic agents: N/A 5. Neuropsych: This patient is not ? capable of making decisions on her own behalf. 6. Skin/Wound Care: Local measures as needed RUE.  7. Fluids/Electrolytes/Nutrition: encourage appropriate PO 8.  New onset atrial fibrillation.   9.  Hypothyroidism.  Continue Synthroid 10.  Dysphagia.  Dysphagia #2 thin liquids.  adv per SLP, watch for pocketing  Advance diet as tolerated 11.  Hyperlipidemia.  Continue Crestor 12.  Morbid obesity.  BMI 45.75.  Dietary follow-up. Discontinue Ensure 13.  AKI/prerenal.   -encourage PO fluids  -labs look better  14. HTN: increase lopressor to '50mg'$  TID, continue to monitor for effects of   Controlled on 8/16, monitor for hypotension 15. Prediabetes HgbA1c 6.2- d/c ensure. Schedule for outpatient Qutenza for diabetic peripheral neuropathy. Provided dietary education 17. Wrist drop: resting hand splint ordered.  18. Insomnia: related  to right hand OA-   '300mg'$  Gabapentin HS- helped 19. Tachycardia: decrease lopressor to '50mg'$  TID Improving  20. Rash bilateral arms and back: discussed using hypoallergenic sheets with nursing, improved- discontinue hydrocortisone. Medications reviewed- none new 21. Polypharamcy: all medications previously reviewed.  22. Aphasia: continue working on reading comprehension, expressive aphasia with SLP 23. Phelgm production: mucinex started. Ordered incentive spirometer and out of bed for meals and educated patient regarding benefits of these to prevent atelectasis and pneumonia.   COVID positive- precautions in place.  24.  Acute lower UTI  E. coli  Empiric Macrobid started on 8/13, sensitivities positive for Macrobid, continue 25. Left lower extremity edema: chronic, orders placed ro elevate 26. Left lower extremity new pain: vascular US ordered.     LOS: 19 days  A FACE TO FACE EVALUATION WAS PERFORMED  Jlynn Langille P Vikki Gains 12/28/2020, 10:30 AM

## 2020-12-28 NOTE — Progress Notes (Signed)
Therapy Plan of Care  Patient Details  Name: Candice Perez MRN: [id number] Date of Birth: 1951/05/05 Today's Date: 12/28/2020  Pt's plan of care adjusted to QD after speaking with care team and discussing with MD based on pt's medical status and tolerance to therapy.    Tawana Scale , PT, DPT, NCS, CSRS  12/28/2020, 10:02 AM

## 2020-12-28 NOTE — Progress Notes (Signed)
Patient ID: Candice Perez, female   DOB: 1950/09/16, 70 y.o.   MRN: [id number] Pt updated regarding team conference, she reports husband being discharged from the hospital today and is feeling better. She will let MD know when able to go home hopefully Thursday may be tomorrow. Home health set up and equipment delivered. Work toward discharge this week.

## 2020-12-28 NOTE — Progress Notes (Signed)
Physical Therapy Session Note  Patient Details  Name: Candice Perez MRN: [id number] Date of Birth: 10-14-1950  Today's Date: 12/28/2020 PT Individual Time: 0915-0945 PT Individual Time Calculation (min): 30 min   and  Today's Date: 12/28/2020 PT Missed Time: 30 Minutes Missed Time Reason: Pain  Short Term Goals: Week 3:  PT Short Term Goal 1 (Week 3): STG = LTG due to ELOS  Skilled Therapeutic Interventions/Progress Updates:    Pt received sitting in recliner and agreeable to therapy session. Pt reporting 8/10 pain in L foot with tenderness to palpation - noticed some increased redness compared to R foot and slight increased swelling - nurse and MD notified. Despite this, pt agreeable to therapy session and performing in-room ambulation. Sit>stand recliner>large based quad cane [facility]) with 2x attempts and min assist for lifting to stand due to increased L foot pain with WBing. Gait training ~7f [facility] and pt demonstrating significant antalgic gait pattern with step-to pattern leading with L LE and using [facility]QC to attempt to off weight L LE- cuing for increased hip extensor activation to support weight when stepping on L LE as pt flexing forward due to the pain. Pt requesting to rest at this time due to L foot pain. Pt left seated in recliner with needs in reach, seat belt alarm on, and B LEs elevated. Missed 30 minutes of skilled physical therapy.  Therapy Documentation Precautions:  Precautions Precautions: Fall Precaution Comments: R Hemi with sensory deficits (RUE > RLE) Restrictions Weight Bearing Restrictions: No Other Position/Activity Restrictions: Right Hemiplegia   Pain:  8/10 in L foot - details above.  Therapy/Group: Individual Therapy  CTawana Scale, PT, DPT, NCS, CSRS 12/28/2020, 7:47 AM

## 2020-12-28 NOTE — Progress Notes (Addendum)
Speech Language Pathology Daily Session Note  Patient Details  Name: Candice Perez MRN: [id number] Date of Birth: 03-17-1951  Today's Date: 12/28/2020 SLP Individual Time: 0820-0855 SLP Individual Time Calculation (min): 35 min and Today's Date: 12/28/2020 SLP Missed Time: 15 Minutes Missed Time Reason: Other (Comment) (Scheduling conflict)  Short Term Goals: Week 3: SLP Short Term Goal 1 (Week 3): STG=LTG  Skilled Therapeutic Interventions: Patient agreeable to skilled ST intervention with focus on communication goals. Missed 15 minutes of treatment due to obtaining appropriate PPE precautions due to positive Covid-19 results. SLP facilitated verbal reading at sentence and short paragraph level using greeting cards that were sent to patient. Patient read with approximately 90% accuracy with good self monitoring of pronunciation errors, and significantly extended time. Patient displayed comprehension with sup  A cues for accuracy. Patient was left in recliner with alarm activated and immediate needs within reach at end of session. Continue per current plan of care.      Pain Pain Assessment Pain Scale: 0-10 Pain Score: 6  Pain Type: Acute pain Pain Location: Leg Pain Orientation: Left Pain Descriptors / Indicators: Aching Pain Intervention(s): Medication (See eMAR)  Therapy/Group: Individual Therapy  Patty Sermons 12/28/2020, 12:11 PM

## 2020-12-28 NOTE — Progress Notes (Signed)
At shift change, was informed that patient was COVID positive. Patient is on precautions. On call provider was informed. No symptoms noted. Patient was incontinent & continent. No c/o pain or discomfort. She is sitting in her chair having breakfast. No acute distress noted. Will continue to monitor

## 2020-12-28 NOTE — Progress Notes (Signed)
Lower extremity venous has been completed.   Preliminary results in CV Proc.   Abram Sander 12/28/2020 4:04 PM

## 2020-12-29 LAB — GLUCOSE, CAPILLARY: Glucose-Capillary: 118 mg/dL — ABNORMAL HIGH (ref 70–99)

## 2020-12-29 MED ORDER — PREDNISONE 20 MG PO TABS
60.0000 mg | ORAL_TABLET | Freq: Once | ORAL | Status: AC
  Administered 2020-12-29: 60 mg via ORAL
  Filled 2020-12-29: qty 3

## 2020-12-29 NOTE — Progress Notes (Signed)
PROGRESS NOTE   Subjective/Complaints: C/o some headache Has not felt left foot pain since has been sleeping- discussed that venous doppler is negative for clot thankfully Denies respiratory symptoms.   ROS: Denies CP, SOB, N/V/D, +dysuria, +left foot pain, +headache  Objective:   VAS [place] LOWER EXTREMITY VENOUS (DVT)  Result Date: 12/28/2020  Lower Venous DVT Study Patient Name:  Candice Perez  Date of Exam:   12/28/2020 Medical Rec #: [id number]         Accession #:    [id number] Date of Birth: 1950-12-04         Patient Gender: F Patient Age:   70 years Exam Location:  [facility] Procedure:      VAS [place] LOWER EXTREMITY VENOUS (DVT) Referring Phys: Leeroy Cha --------------------------------------------------------------------------------  Indications: Pain.  Comparison Study: no prior Performing Technologist: Archie Patten RVS  Examination Guidelines: A complete evaluation includes B-mode imaging, spectral Doppler, color Doppler, and power Doppler as needed of all accessible portions of each vessel. Bilateral testing is considered an integral part of a complete examination. Limited examinations for reoccurring indications may be performed as noted. The reflux portion of the exam is performed with the patient in reverse Trendelenburg.  +-----+---------------+---------+-----------+----------+--------------+ RIGHTCompressibilityPhasicitySpontaneityPropertiesThrombus Aging +-----+---------------+---------+-----------+----------+--------------+ CFV  Full           Yes      Yes                                 +-----+---------------+---------+-----------+----------+--------------+   +---------+---------------+---------+-----------+----------+--------------+ LEFT     CompressibilityPhasicitySpontaneityPropertiesThrombus Aging +---------+---------------+---------+-----------+----------+--------------+ CFV      Full            Yes      Yes                                 +---------+---------------+---------+-----------+----------+--------------+ SFJ      Full                                                        +---------+---------------+---------+-----------+----------+--------------+ FV Prox  Full                                                        +---------+---------------+---------+-----------+----------+--------------+ FV Mid   Full                                                        +---------+---------------+---------+-----------+----------+--------------+ FV DistalFull                                                        +---------+---------------+---------+-----------+----------+--------------+  PFV      Full                                                        +---------+---------------+---------+-----------+----------+--------------+ POP      Full           Yes      Yes                                 +---------+---------------+---------+-----------+----------+--------------+ PTV      Full                                                        +---------+---------------+---------+-----------+----------+--------------+ PERO     Full                                                        +---------+---------------+---------+-----------+----------+--------------+     Summary: RIGHT: - No evidence of common femoral vein obstruction.  LEFT: - There is no evidence of deep vein thrombosis in the lower extremity.  - No cystic structure found in the popliteal fossa.  *See table(s) above for measurements and observations. Electronically signed by Deitra Mayo MD on 12/28/2020 at 4:43:55 PM.    Final    No results for input(s): WBC, HGB, HCT, PLT in the last 72 hours.  No results for input(s): NA, K, CL, CO2, GLUCOSE, BUN, CREATININE, CALCIUM in the last 72 hours.   Intake/Output Summary (Last 24 hours) at 12/29/2020 1031 Last data filed at  12/28/2020 1811 Gross per 24 hour  Intake 480 ml  Output --  Net 480 ml        Physical Exam: Vital Signs Blood pressure 140/68, pulse 66, temperature 97.7 F (36.5 C), temperature source Oral, resp. rate 18, height '5\' 7"'$  (1.702 m), weight 126.1 kg, SpO2 92 %. Gen: no distress, normal appearing HEENT: oral mucosa pink and moist, NCAT Cardio: Reg rate Chest: normal effort, normal rate of breathing  GI: Non-distended.  BS +. Skin: Warm and dry.  Intact. Psych: Normal mood.  Normal behavior. Musc: Lower extremity edema.. TTP over her left foot Neuro: Alert Neuro:.:  Shoulder abduction, elbow flexion/extension 4 -/5, handgrip 1+/5  Assessment/Plan: 1. Functional deficits which require 3+ hours per day of interdisciplinary therapy in a comprehensive inpatient rehab setting. Physiatrist is providing close team supervision and 24 hour management of active medical problems listed below. Physiatrist and rehab team continue to assess barriers to discharge/monitor patient progress toward functional and medical goals  Care Tool:  Bathing    Body parts bathed by patient: Right arm, Left arm, Chest, Abdomen, Front perineal area, Right upper leg, Left upper leg, Face, Buttocks   Body parts bathed by helper: Right lower leg, Left lower leg Body parts n/a: Buttocks   Bathing assist Assist Level: Minimal Assistance - Patient > 75%     Upper Body Dressing/Undressing Upper body dressing   What is the patient  wearing?: Pull over shirt    Upper body assist Assist Level: Set up assist    Lower Body Dressing/Undressing Lower body dressing      What is the patient wearing?: Incontinence brief, Pants     Lower body assist Assist for lower body dressing: Minimal Assistance - Patient > 75%     Toileting Toileting    Toileting assist Assist for toileting: Maximal Assistance - Patient 25 - 49%     Transfers Chair/bed transfer  Transfers assist     Chair/bed transfer assist  level: Contact Guard/Touching assist     Locomotion Ambulation   Ambulation assist      Assist level: Contact Guard/Touching assist Assistive device: Cane-quad Max distance: 13f   Walk 10 feet activity   Assist     Assist level: Contact Guard/Touching assist Assistive device: Cane-quad   Walk 50 feet activity   Assist Walk 50 feet with 2 turns activity did not occur: Safety/medical concerns  Assist level: Contact Guard/Touching assist Assistive device: Cane-quad    Walk 150 feet activity   Assist Walk 150 feet activity did not occur: Safety/medical concerns (fatigue)         Walk 10 feet on uneven surface  activity   Assist Walk 10 feet on uneven surfaces activity did not occur: Safety/medical concerns         Wheelchair     Assist Will patient use wheelchair at discharge?: No   Wheelchair activity did not occur: N/A         Wheelchair 50 feet with 2 turns activity    Assist    Wheelchair 50 feet with 2 turns activity did not occur: N/A       Wheelchair 150 feet activity     Assist  Wheelchair 150 feet activity did not occur: N/A       Blood pressure 140/68, pulse 66, temperature 97.7 F (36.5 C), temperature source Oral, resp. rate 18, height '5\' 7"'$  (1.702 m), weight 126.1 kg, SpO2 92 %.  Medical Problem List and Plan: 1.  Right side hemiparesis and aphasia secondary to left MCA infarct due to left MCA occlusion status post thrombectomy with TIC12c etiology likely due to newly diagnosed atrial fibrillation  Continue CIR 2.  New onset atrial fibrillation: continue Eliquis, Amiodarone 200 mg daily, Lanoxin 0.125 mg daily, Cardizem 240 mg daily, decrease lopressor to 50 mg TID. Magnesium level reviewed and stable.  3. Hand joint OA: voltaren gel ordered with improvement. Placed nursing order to indicate that patient may use her home tart cherry supplement three times per day- we were unable to get this back from pharmacy-  family will bring in new bottle and discussed with nursing to leave this at her bedside  -Also reviewed the following additional supplement which can be helpful for her OA: OMEGA 3 FATTY ACIDS, TURMERIC, GINGER,  CELERY SEED, GLUCOSAMINE WITH CHONDROITIN   4. Mood: Provide emotional support             -antipsychotic agents: N/A 5. Neuropsych: This patient is not ? capable of making decisions on her own behalf. 6. Skin/Wound Care: Local measures as needed RUE.  7. Fluids/Electrolytes/Nutrition: encourage appropriate PO 8.  New onset atrial fibrillation.   9.  Hypothyroidism.  Continue Synthroid 10.  Dysphagia.  Dysphagia #2 thin liquids.  adv per SLP, watch for pocketing  Advance diet as tolerated 11.  Hyperlipidemia.  Continue Crestor 12.  Morbid obesity.  BMI 45.75.  Dietary follow-up. Discontinue Ensure 13.  AKI/prerenal.   -encourage PO fluids  -labs look better  14. HTN: increase lopressor to '50mg'$  TID, continue to monitor for effects of   Controlled on 8/16, monitor for hypotension 15. Prediabetes HgbA1c 6.2- d/c ensure. Schedule for outpatient Qutenza for diabetic peripheral neuropathy. Provided dietary education 17. Wrist drop: resting hand splint ordered.  18. Insomnia: related to right hand OA-   '300mg'$  Gabapentin HS- helped 19. Tachycardia: decrease lopressor to '50mg'$  TID Improving  20. Rash bilateral arms and back: discussed using hypoallergenic sheets with nursing, improved- discontinue hydrocortisone. Medications reviewed- none new 21. Polypharamcy: all medications previously reviewed.  22. Aphasia: continue working on reading comprehension, expressive aphasia with SLP 23. Phelgm production: mucinex started. Ordered incentive spirometer and out of bed for meals and educated patient regarding benefits of these to prevent atelectasis and pneumonia.   COVID positive- precautions in place.  24.  Acute lower UTI  E. coli  Empiric Macrobid started on 8/13, sensitivities positive  for Macrobid, continue 25. Left lower extremity edema: chronic, orders placed ro elevate, please continue to do this regularly 26. Left lower extremity new pain: vascular US reviewed and negative for clot    LOS: 20 days A FACE TO FACE EVALUATION WAS PERFORMED  Martha Clan P Meredyth Hornung 12/29/2020, 10:31 AM

## 2020-12-29 NOTE — Progress Notes (Signed)
Occupational Therapy Session Note  Patient Details  Name: Candice Perez MRN: [id number] Date of Birth: 06-03-1950  Today's Date: 12/29/2020 OT Individual Time: 0915-1000 OT Individual Time Calculation (min): 45 min    Short Term Goals: Week 2:  OT Short Term Goal 1 (Week 2): Pt will complete toilet transfers with Supervision. OT Short Term Goal 2 (Week 2): Pt will complete toileting with min A. OT Short Term Goal 3 (Week 2): Pt will don pants with min A.  Skilled Therapeutic Interventions/Progress Updates:    Pt supine in bed, reports she had incontinence of bowel and needs changing.  Pt c/o headache and left foot 5/10 pain and states she got medication recently for it.  Supine to sit with CGA.  Min assist with bed elevated sit to stand using quad cane.  Pt ambulated slowly needing CGA and pt having difficulty bearing weight through left foot secondary to pain.  Min assist toilet transfer due to foot pain.  Pt required mod assist for toileting due to limited reach and thoroughness during pericare with loose stool present.  Pt ambulated back to chair using QC with CGA and increased time.  Stand to sit at w/c with CGA. Call bell in reach, seat alarm on. Pt would benefit from periwand training to increase independence during toileting.   Therapy Documentation Precautions:  Precautions Precautions: Fall Precaution Comments: R Hemi with sensory deficits (RUE > RLE) Restrictions Weight Bearing Restrictions: No Other Position/Activity Restrictions: Right Hemiplegia    Therapy/Group: Individual Therapy  Ezekiel Slocumb 12/29/2020, 10:43 AM

## 2020-12-29 NOTE — Progress Notes (Signed)
Inpatient Rehabilitation Care Coordinator Discharge Note  The overall goal for the admission was met for:   Discharge location: YES-HOME WITH HUSBAND WHO CAN PROVIDE ASSIST  Length of Stay: NO-DUE TO [place]- 21 DAYS  Discharge activity level: Yes-SUPERVISION-CGA LEVEL  Home/community participation: Yes  Services provided included: MD, RD, PT, OT, SLP, RN, CM, Pharmacy, and SW  Financial Services: Private Insurance: [place] offered to/list presented to:PT AND HUSBAND  Follow-up services arranged: Home Health: [facility]-PT OT, SP, DME: [facility] AND [facility], and Patient/Family has no preference for HH/DME agencies  Comments (or additional information):HUSBAND WAS HERE PRIOR TO COVID AND WAS TRAINED IN [facility] CARE  Patient/Family verbalized understanding of follow-up arrangements: Yes  Individual responsible for coordination of the follow-up plan: HOWARD-HUSBAND [phone]  Confirmed correct DME delivered: Elease Hashimoto 12/29/2020    Tylon Kemmerling, Gardiner Rhyme

## 2020-12-29 NOTE — Progress Notes (Signed)
Patient ID: Candice Perez, female   DOB: 1951/02/13, 70 y.o.   MRN: [id number]  Spoke with husband via telephone to ask how he is feeling. He feels he will be ready tomorrow to take wife home but is concerned about her foot pain today. He wants MD to call him in the morning to let him know she is medically ready and to answer his questions. He may send his son to pick her up. Will ask PA to go over instructions over the phone in case husband is not coming to get her. Continue to work on discharge and see in am.

## 2020-12-29 NOTE — Progress Notes (Signed)
Physical Therapy Session Note  Patient Details  Name: Candice Perez MRN: [id number] Date of Birth: 16-Mar-1951  Today's Date: 12/29/2020 PT Individual Time: [id number] PT Individual Time Calculation (min): 39 min   Short Term Goals: Week 3:  PT Short Term Goal 1 (Week 3): STG = LTG due to ELOS  Skilled Therapeutic Interventions/Progress Updates:    Pt received sitting in recliner and agreeable to therapy session. Reports need to use bathroom. Sit>stand recliner>large based quad cane (LBQC) in L UE with 2x attempts with pt demoing improved power-up when therapist counts to 3 - cuing and facilitation for pt to push up with R UE from armrest for NMR (pt reports some R hand pain but tolerates this positioning with light WBing). Pt continues to report L foot pain that increase with WBing (pt reports she believes wearing shoes will increase pain and declines donning them). Gait training ~24f 2x to/from toilet using LBQC with min assist and therapist supporting R UE forearm to allow pt to off-weight L LE more during stance (if L foot pain continues pt may benefit from R platform RW) - pt has significant antalgic step-to gait pattern with leading with L LE and even smaller step lengths compared to yesterday with even slower speed of movement. MD notified. Able to manage LB clothing with min assist for R side and CGA/min assist for steadying balance. Continent of bladder - anterior peri-care without assist and total assist for posterior peri-care for cleanliness per pt request. Standing hand hygiene at sink with CGA. Pt requesting to return to bed. Sit>supine supervision. Pt left supine in bed with needs in reach and bed alarm on.  Therapy Documentation Precautions:  Precautions Precautions: Fall Precaution Comments: R Hemi with sensory deficits (RUE > RLE) Restrictions Weight Bearing Restrictions: No Other Position/Activity Restrictions: Right Hemiplegia   Pain: Continues to report 8-9/10 pain in  L foot that increases with WBing - notified MD and provided increased UE support during mobility. Assisted pt to return to supine with B LEs elevated for pain management at end of session.    Therapy/Group: Individual Therapy  CTawana Scale, PT, DPT, NCS, CSRS 12/29/2020, 12:22 PM

## 2020-12-29 NOTE — Progress Notes (Signed)
Speech Language Pathology Discharge Summary  Patient Details  Name: Candice Perez MRN: [id number] Date of Birth: [birth year]  Today's Date: 12/30/2020 SLP Individual Time: 0850-0915 SLP Individual Time Calculation (min): 25 min  Skilled Therapeutic Interventions: Patient agreeable to skilled ST intervention with focus on communication and swallow education. SLP reinforced education on communication and safe swallow strategies and precautions, diet recommendations, and recommendations for home health SLP services to further maximize speech, language, and swallow function and for further speech and cognitive-linguistic evaluation. Pt in agreement and all questions were addressed and patient is set to discharge later this a.m.Patient was left in her bed with alarm activated and needs within reach.  Patient has met 7 of 7 long term goals.  Patient to discharge at overall Supervision level.  Reasons goals not met: NA   Clinical Impression/Discharge Summary: Patient has made functional gains and has met 7 of 7 long-term goals this admission due to improved speech, language, swallow function. Patient able to communicate at phrase and conversational level with continued improvements in verbal fluency as well as improved word finding. She has been upgraded to Dys 3 solids from Dys 2 solids and is tolerating without difficulty. Patient to discharge at overall Supervision level. Patient's care partner is independent to provide the necessary physical and cognitive assistance at discharge.   patient would benefit from continued SLP services in the home health setting to maximize speech/language, cognitive-linguistic, and swallow function and functional independence.    Care Partner:  Caregiver Able to Provide Assistance: Yes  Type of Caregiver Assistance: Physical;Cognitive  Recommendation:  Home Health SLP  Rationale for SLP Follow Up: Maximize functional communication;Maximize swallowing safety;Reduce  caregiver burden;Maximize cognitive function and independence   Equipment: NA   Reasons for discharge: Treatment goals met;Discharged from hospital   Patient/Family Agrees with Progress Made and Goals Achieved: Yes    Alfreida Steffenhagen T Chyann Ambrocio 12/30/2020, 2:08 PM

## 2020-12-30 MED ORDER — PREDNISONE 10 MG PO TABS: ORAL_TABLET | ORAL | 0 refills | Status: DC

## 2020-12-30 NOTE — Progress Notes (Signed)
Occupational Therapy Discharge Summary  Patient Details  Name: Candice Perez MRN: [id number] Date of Birth: 04-06-1951  Today's Date: 12/30/2020 OT Individual Time: 6226-3335 OT Individual Time Calculation (min): 45 min    Patient has met 8 of 12 long term goals due to improved activity tolerance, improved balance, postural control, ability to compensate for deficits, functional use of  RIGHT upper and RIGHT lower extremity, improved attention, improved awareness, and improved coordination.  Patient to discharge at overall Supervision level.  Patient's care partner is independent to provide the necessary physical and cognitive assistance at discharge.    Reasons goals not met: Pt continues to have absent sensation of right hand requiring setup for feeding and grooming and intermittent assist with functional use of right hand.  Also requires fluctuating level of assist during toileting due to intermittent loose stool and neurogenic incontinence.  Recommendation:  Patient will benefit from ongoing skilled OT services in home health setting to continue to advance functional skills in the area of BADL.  Equipment: Bedside commode and quad cane  Reasons for discharge: treatment goals met and discharge from hospital  Patient/family agrees with progress made and goals achieved: Yes  OT Discharge Precautions/Restrictions  Precautions Precautions: Fall Precaution Comments: R Hemi with sensory deficits (RUE > RLE) Restrictions Weight Bearing Restrictions: No Other Position/Activity Restrictions: Right Hemiplegia Pain Pain Assessment Pain Scale: 0-10 Pain Score: 6  Pain Location: Foot Pain Orientation: Left Pain Descriptors / Indicators: Sore Pain Onset: With Activity Pain Intervention(s): RN made aware;MD notified (Comment) ADL ADL Eating: Supervision/safety Grooming: Setup Where Assessed-Grooming: Standing at sink Upper Body Bathing: Setup Where Assessed-Upper Body Bathing:  Shower Lower Body Bathing: Minimal assistance Where Assessed-Lower Body Bathing: Shower Upper Body Dressing: Setup Where Assessed-Upper Body Dressing: Edge of bed Lower Body Dressing: Supervision/safety Where Assessed-Lower Body Dressing: Edge of bed Toileting: Moderate assistance Where Assessed-Toileting: Glass blower/designer: Close supervision Toilet Transfer Method: Counselling psychologist: Grab bars Tub/Shower Transfer: Metallurgist Method: Optometrist: Facilities manager: Environmental education officer Method: Heritage manager: Grab bars, Gaffer Baseline Vision/History: Wears glasses Wears Glasses: Reading only Patient Visual Report: No change from baseline Vision Assessment?: No apparent visual deficits Perception  Perception: Within Functional Limits Praxis Praxis: Intact Cognition Overall Cognitive Status: Within Functional Limits for tasks assessed Arousal/Alertness: Awake/alert Orientation Level: Oriented X4 Attention: Sustained;Selective;Focused Focused Attention: Appears intact Sustained Attention: Appears intact Selective Attention: Appears intact Memory: Appears intact Awareness: Appears intact Problem Solving: Appears intact Safety/Judgment: Appears intact Sensation Sensation Light Touch: Impaired Detail Light Touch Impaired Details: Absent RUE;Impaired RLE Hot/Cold: Appears Intact Proprioception: Impaired by gross assessment Stereognosis: Not tested Coordination Gross Motor Movements are Fluid and Coordinated: No Fine Motor Movements are Fluid and Coordinated: No Coordination and Movement Description: Impacted by hemiplegia and body habitus. Sensory deficits on RUE impacting as well. Very effortful movement patterns; improved since IE however Finger Nose Finger Test: impaired RUE; intact LUE Motor  Motor Motor:  Hemiplegia Motor - Discharge Observations: Improved functional strength since date of evaluation Mobility  Bed Mobility Rolling Right: Independent Rolling Left: Independent Supine to Sit: Independent Sit to Supine: Independent Transfers Sit to Stand: Supervision/Verbal cueing Stand to Sit: Supervision/Verbal cueing  Trunk/Postural Assessment  Cervical Assessment Cervical Assessment: Within Functional Limits Thoracic Assessment Thoracic Assessment: Exceptions to [facility] (slightly kyphotic) Lumbar Assessment Lumbar Assessment: Exceptions to [facility] (posterior pelvic tilt) Postural Control Postural Control: Within Functional Limits  Balance Balance Balance  Assessed: Yes Static Sitting Balance Static Sitting - Balance Support: No upper extremity supported;Feet supported Static Sitting - Level of Assistance: 7: Independent Dynamic Sitting Balance Dynamic Sitting - Balance Support: During functional activity;Feet supported Dynamic Sitting - Level of Assistance: 7: Independent Static Standing Balance Static Standing - Balance Support: During functional activity Static Standing - Level of Assistance: 5: Stand by assistance Dynamic Standing Balance Dynamic Standing - Balance Support: During functional activity Dynamic Standing - Level of Assistance: 5: Stand by assistance Extremity/Trunk Assessment RUE Assessment RUE Assessment: Exceptions to [facility] Passive Range of Motion (PROM) Comments: WFL Active Range of Motion (AROM) Comments: WFL General Strength Comments: pt has 3/5 grasp, 4-/5 elbow flexion/extension RUE Body System: Neuro Brunstrum levels for arm and hand: Arm;Hand Brunstrum level for arm: Stage IV Movement is deviating from synergy Brunstrum level for hand: Stage IV Movements deviating from synergies LUE Assessment LUE Assessment: Within Functional Limits   Candice Perez 12/30/2020, 12:03 PM

## 2020-12-30 NOTE — Plan of Care (Signed)
  Problem: Candice Perez Goal: LTG Patient will consume least restrictive diet using compensatory strategies with assistance (SLP) Description: LTG:  Patient will consume least restrictive diet using compensatory strategies with assistance (SLP) Outcome: Completed/Met Goal: LTG Patient will participate in dysphagia therapy to increase swallow function with assistance (SLP) Description: LTG:  Patient will participate in dysphagia therapy to increase swallow function with assistance (SLP) Outcome: Completed/Met Goal: LTG Pt will demonstrate functional change in swallow as evidenced by bedside/clinical objective assessment (SLP) Description: LTG: Patient will demonstrate functional change in swallow as evidenced by bedside/clinical objective assessment (SLP) Outcome: Completed/Met   Problem: Candice Perez Communication Goal: LTG Patient will comprehend basic/complex auditory (SLP) Description: LTG: Patient will comprehend basic/complex auditory information with cues (SLP). Outcome: Completed/Met   Problem: Candice Perez Communication Goal: LTG Patient will verbally express basic/complex needs(SLP) Description: LTG:  Patient will verbally express basic/complex needs, wants or ideas with cues  (SLP) Outcome: Completed/Met Goal: LTG Patient will increase speech intelligibility (SLP) Description: LTG: Patient will increase speech intelligibility at word/phrase/conversation level with cues, % of the time (SLP) Outcome: Completed/Met   Problem: Candice Perez Goal: LTG Patient will demonstrate ability for day to day (SLP) Description: LTG:   Patient will demonstrate ability for day to day recall/carryover during cognitive/linguistic activities with assist  (SLP) Outcome: Completed/Met   Problem: Candice Perez Goal: LTG: Patient will demonstrate Perez during functional activites type of (SLP) Description: LTG: Patient will demonstrate Perez during functional activites type of  (SLP) Outcome: Completed/Met

## 2020-12-30 NOTE — Plan of Care (Signed)
  Problem: Consults Goal: RH STROKE PATIENT EDUCATION Description: See Patient Education module for education specifics  Outcome: Completed/Met   Problem: RH BOWEL ELIMINATION Goal: RH STG MANAGE BOWEL WITH ASSISTANCE Description: STG Manage Bowel with mod I Assistance. Outcome: Completed/Met Goal: RH STG MANAGE BOWEL W/MEDICATION W/ASSISTANCE Description: STG Manage Bowel with Medication with mod I Assistance. Outcome: Completed/Met   Problem: RH BLADDER ELIMINATION Goal: RH STG MANAGE BLADDER WITH ASSISTANCE Description: STG Manage Bladder With cues/reminders Assistance Outcome: Completed/Met   Problem: RH SAFETY Goal: RH STG ADHERE TO SAFETY PRECAUTIONS W/ASSISTANCE/DEVICE Description: STG Adhere to Safety Precautions With cues/reminders Assistance/Device. Outcome: Completed/Met   Problem: RH PAIN MANAGEMENT Goal: RH STG PAIN MANAGED AT OR BELOW PT'S PAIN GOAL Description: At or below level 4 Outcome: Completed/Met   Problem: RH KNOWLEDGE DEFICIT Goal: RH STG INCREASE KNOWLEDGE OF HYPERTENSION Description: Patient will be able to manage HTN with medications and dietary modifications using handouts and educational resources with cues/reminders Outcome: Completed/Met Goal: RH STG INCREASE KNOWLEDGE OF DYSPHAGIA/FLUID INTAKE Description: Patient will be able to manage dysphagia, medications and dietary modifications using handouts and educational resources with cues/reminders Outcome: Completed/Met Goal: RH STG INCREASE KNOWLEGDE OF HYPERLIPIDEMIA Description: Patient will be able to manage HLD with medications and dietary modifications using handouts and educational resources with cues/reminders Outcome: Completed/Met Goal: RH STG INCREASE KNOWLEDGE OF STROKE PROPHYLAXIS Description: Patient will be able to manage secondary stroke risks with medications and dietary modifications using handouts and educational resources with cues/reminders Outcome: Completed/Met   Problem:  Health Behavior/Discharge Planning: Goal: Ability to safely manage health-related needs after discharge will improve Description: Patient will be able to manage AFIB with medications and dietary modifications using handouts and educational resources with cues/reminders Outcome: Completed/Met

## 2020-12-30 NOTE — Progress Notes (Signed)
Patient discharged off of unit with all belongings. Discharge papers/instructions explained by physician assistant to patient. Patient has no further questions at time of discharge. No complications noted at this time.  [place]

## 2021-01-04 DIAGNOSIS — M19042 Primary osteoarthritis, left hand: Secondary | ICD-10-CM | POA: Diagnosis not present

## 2021-01-04 DIAGNOSIS — Z7901 Long term (current) use of anticoagulants: Secondary | ICD-10-CM | POA: Diagnosis not present

## 2021-01-04 DIAGNOSIS — Z6839 Body mass index (BMI) 39.0-39.9, adult: Secondary | ICD-10-CM | POA: Diagnosis not present

## 2021-01-04 DIAGNOSIS — Z8616 Personal history of COVID-19: Secondary | ICD-10-CM | POA: Diagnosis not present

## 2021-01-04 DIAGNOSIS — I69320 Aphasia following cerebral infarction: Secondary | ICD-10-CM | POA: Diagnosis not present

## 2021-01-04 DIAGNOSIS — I10 Essential (primary) hypertension: Secondary | ICD-10-CM | POA: Diagnosis not present

## 2021-01-04 DIAGNOSIS — K5900 Constipation, unspecified: Secondary | ICD-10-CM | POA: Diagnosis not present

## 2021-01-04 DIAGNOSIS — R1310 Dysphagia, unspecified: Secondary | ICD-10-CM | POA: Diagnosis not present

## 2021-01-04 DIAGNOSIS — I69351 Hemiplegia and hemiparesis following cerebral infarction affecting right dominant side: Secondary | ICD-10-CM | POA: Diagnosis not present

## 2021-01-04 DIAGNOSIS — I83893 Varicose veins of bilateral lower extremities with other complications: Secondary | ICD-10-CM | POA: Diagnosis not present

## 2021-01-04 DIAGNOSIS — S51801D Unspecified open wound of right forearm, subsequent encounter: Secondary | ICD-10-CM | POA: Diagnosis not present

## 2021-01-04 DIAGNOSIS — Z79891 Long term (current) use of opiate analgesic: Secondary | ICD-10-CM | POA: Diagnosis not present

## 2021-01-04 DIAGNOSIS — G8928 Other chronic postprocedural pain: Secondary | ICD-10-CM | POA: Diagnosis not present

## 2021-01-04 DIAGNOSIS — E039 Hypothyroidism, unspecified: Secondary | ICD-10-CM | POA: Diagnosis not present

## 2021-01-04 DIAGNOSIS — N179 Acute kidney failure, unspecified: Secondary | ICD-10-CM | POA: Diagnosis not present

## 2021-01-04 DIAGNOSIS — K219 Gastro-esophageal reflux disease without esophagitis: Secondary | ICD-10-CM | POA: Diagnosis not present

## 2021-01-04 DIAGNOSIS — I4891 Unspecified atrial fibrillation: Secondary | ICD-10-CM | POA: Diagnosis not present

## 2021-01-04 DIAGNOSIS — Z86718 Personal history of other venous thrombosis and embolism: Secondary | ICD-10-CM | POA: Diagnosis not present

## 2021-01-04 DIAGNOSIS — D500 Iron deficiency anemia secondary to blood loss (chronic): Secondary | ICD-10-CM | POA: Diagnosis not present

## 2021-01-04 DIAGNOSIS — D849 Immunodeficiency, unspecified: Secondary | ICD-10-CM | POA: Diagnosis not present

## 2021-01-04 DIAGNOSIS — U071 COVID-19: Secondary | ICD-10-CM | POA: Diagnosis not present

## 2021-01-04 DIAGNOSIS — M19041 Primary osteoarthritis, right hand: Secondary | ICD-10-CM | POA: Diagnosis not present

## 2021-01-04 DIAGNOSIS — J9601 Acute respiratory failure with hypoxia: Secondary | ICD-10-CM | POA: Diagnosis not present

## 2021-01-04 DIAGNOSIS — G8929 Other chronic pain: Secondary | ICD-10-CM | POA: Diagnosis not present

## 2021-01-04 DIAGNOSIS — N390 Urinary tract infection, site not specified: Secondary | ICD-10-CM | POA: Diagnosis not present

## 2021-01-04 DIAGNOSIS — Z8719 Personal history of other diseases of the digestive system: Secondary | ICD-10-CM | POA: Diagnosis not present

## 2021-01-04 DIAGNOSIS — M21371 Foot drop, right foot: Secondary | ICD-10-CM | POA: Diagnosis not present

## 2021-01-04 DIAGNOSIS — E785 Hyperlipidemia, unspecified: Secondary | ICD-10-CM | POA: Diagnosis not present

## 2021-01-06 DIAGNOSIS — K5900 Constipation, unspecified: Secondary | ICD-10-CM | POA: Diagnosis not present

## 2021-01-06 DIAGNOSIS — M19041 Primary osteoarthritis, right hand: Secondary | ICD-10-CM | POA: Diagnosis not present

## 2021-01-06 DIAGNOSIS — G8929 Other chronic pain: Secondary | ICD-10-CM | POA: Diagnosis not present

## 2021-01-06 DIAGNOSIS — D500 Iron deficiency anemia secondary to blood loss (chronic): Secondary | ICD-10-CM | POA: Diagnosis not present

## 2021-01-06 DIAGNOSIS — N179 Acute kidney failure, unspecified: Secondary | ICD-10-CM | POA: Diagnosis not present

## 2021-01-06 DIAGNOSIS — Z6839 Body mass index (BMI) 39.0-39.9, adult: Secondary | ICD-10-CM | POA: Diagnosis not present

## 2021-01-06 DIAGNOSIS — Z7901 Long term (current) use of anticoagulants: Secondary | ICD-10-CM | POA: Diagnosis not present

## 2021-01-06 DIAGNOSIS — I83893 Varicose veins of bilateral lower extremities with other complications: Secondary | ICD-10-CM | POA: Diagnosis not present

## 2021-01-06 DIAGNOSIS — D849 Immunodeficiency, unspecified: Secondary | ICD-10-CM | POA: Diagnosis not present

## 2021-01-06 DIAGNOSIS — K219 Gastro-esophageal reflux disease without esophagitis: Secondary | ICD-10-CM | POA: Diagnosis not present

## 2021-01-06 DIAGNOSIS — I4891 Unspecified atrial fibrillation: Secondary | ICD-10-CM | POA: Diagnosis not present

## 2021-01-06 DIAGNOSIS — M19042 Primary osteoarthritis, left hand: Secondary | ICD-10-CM | POA: Diagnosis not present

## 2021-01-06 DIAGNOSIS — U071 COVID-19: Secondary | ICD-10-CM | POA: Diagnosis not present

## 2021-01-06 DIAGNOSIS — R1310 Dysphagia, unspecified: Secondary | ICD-10-CM | POA: Diagnosis not present

## 2021-01-06 DIAGNOSIS — M21371 Foot drop, right foot: Secondary | ICD-10-CM | POA: Diagnosis not present

## 2021-01-06 DIAGNOSIS — I10 Essential (primary) hypertension: Secondary | ICD-10-CM | POA: Diagnosis not present

## 2021-01-06 DIAGNOSIS — N390 Urinary tract infection, site not specified: Secondary | ICD-10-CM | POA: Diagnosis not present

## 2021-01-06 DIAGNOSIS — E039 Hypothyroidism, unspecified: Secondary | ICD-10-CM | POA: Diagnosis not present

## 2021-01-06 DIAGNOSIS — J9601 Acute respiratory failure with hypoxia: Secondary | ICD-10-CM | POA: Diagnosis not present

## 2021-01-06 DIAGNOSIS — I69351 Hemiplegia and hemiparesis following cerebral infarction affecting right dominant side: Secondary | ICD-10-CM | POA: Diagnosis not present

## 2021-01-06 DIAGNOSIS — S51801D Unspecified open wound of right forearm, subsequent encounter: Secondary | ICD-10-CM | POA: Diagnosis not present

## 2021-01-06 DIAGNOSIS — E785 Hyperlipidemia, unspecified: Secondary | ICD-10-CM | POA: Diagnosis not present

## 2021-01-06 DIAGNOSIS — I69320 Aphasia following cerebral infarction: Secondary | ICD-10-CM | POA: Diagnosis not present

## 2021-01-11 ENCOUNTER — Telehealth: Payer: Self-pay | Admitting: *Deleted

## 2021-01-11 NOTE — Telephone Encounter (Signed)
Mark PT from [facility] called to request POC 1wk1,2wk4,1wk2.  Approval given.

## 2021-01-12 ENCOUNTER — Other Ambulatory Visit: Payer: Self-pay

## 2021-01-12 ENCOUNTER — Encounter: Payer: Medicare HMO | Attending: Physical Medicine and Rehabilitation | Admitting: Physical Medicine and Rehabilitation

## 2021-01-12 ENCOUNTER — Encounter: Payer: Self-pay | Admitting: Physical Medicine and Rehabilitation

## 2021-01-12 ENCOUNTER — Telehealth: Payer: Self-pay | Admitting: *Deleted

## 2021-01-12 VITALS — BP 119/72 | HR 55 | Temp 97.6°F | Ht 67.0 in | Wt 278.0 lb

## 2021-01-12 DIAGNOSIS — N179 Acute kidney failure, unspecified: Secondary | ICD-10-CM | POA: Diagnosis not present

## 2021-01-12 DIAGNOSIS — Z7901 Long term (current) use of anticoagulants: Secondary | ICD-10-CM | POA: Diagnosis not present

## 2021-01-12 DIAGNOSIS — I83893 Varicose veins of bilateral lower extremities with other complications: Secondary | ICD-10-CM | POA: Diagnosis not present

## 2021-01-12 DIAGNOSIS — M19042 Primary osteoarthritis, left hand: Secondary | ICD-10-CM | POA: Diagnosis not present

## 2021-01-12 DIAGNOSIS — I4891 Unspecified atrial fibrillation: Secondary | ICD-10-CM | POA: Diagnosis not present

## 2021-01-12 DIAGNOSIS — E039 Hypothyroidism, unspecified: Secondary | ICD-10-CM | POA: Diagnosis not present

## 2021-01-12 DIAGNOSIS — D849 Immunodeficiency, unspecified: Secondary | ICD-10-CM | POA: Diagnosis not present

## 2021-01-12 DIAGNOSIS — G8929 Other chronic pain: Secondary | ICD-10-CM | POA: Diagnosis not present

## 2021-01-12 DIAGNOSIS — Z6839 Body mass index (BMI) 39.0-39.9, adult: Secondary | ICD-10-CM | POA: Diagnosis not present

## 2021-01-12 DIAGNOSIS — G609 Hereditary and idiopathic neuropathy, unspecified: Secondary | ICD-10-CM | POA: Insufficient documentation

## 2021-01-12 DIAGNOSIS — R7303 Prediabetes: Secondary | ICD-10-CM | POA: Insufficient documentation

## 2021-01-12 DIAGNOSIS — K219 Gastro-esophageal reflux disease without esophagitis: Secondary | ICD-10-CM | POA: Diagnosis not present

## 2021-01-12 DIAGNOSIS — I69351 Hemiplegia and hemiparesis following cerebral infarction affecting right dominant side: Secondary | ICD-10-CM | POA: Diagnosis not present

## 2021-01-12 DIAGNOSIS — D500 Iron deficiency anemia secondary to blood loss (chronic): Secondary | ICD-10-CM | POA: Diagnosis not present

## 2021-01-12 DIAGNOSIS — J9601 Acute respiratory failure with hypoxia: Secondary | ICD-10-CM | POA: Diagnosis not present

## 2021-01-12 DIAGNOSIS — U071 COVID-19: Secondary | ICD-10-CM | POA: Diagnosis not present

## 2021-01-12 DIAGNOSIS — R1310 Dysphagia, unspecified: Secondary | ICD-10-CM | POA: Diagnosis not present

## 2021-01-12 DIAGNOSIS — M21371 Foot drop, right foot: Secondary | ICD-10-CM | POA: Diagnosis not present

## 2021-01-12 DIAGNOSIS — I63512 Cerebral infarction due to unspecified occlusion or stenosis of left middle cerebral artery: Secondary | ICD-10-CM | POA: Diagnosis not present

## 2021-01-12 DIAGNOSIS — K5900 Constipation, unspecified: Secondary | ICD-10-CM | POA: Diagnosis not present

## 2021-01-12 DIAGNOSIS — N390 Urinary tract infection, site not specified: Secondary | ICD-10-CM | POA: Diagnosis not present

## 2021-01-12 DIAGNOSIS — I10 Essential (primary) hypertension: Secondary | ICD-10-CM | POA: Diagnosis not present

## 2021-01-12 DIAGNOSIS — R69 Illness, unspecified: Secondary | ICD-10-CM | POA: Diagnosis not present

## 2021-01-12 DIAGNOSIS — S51801D Unspecified open wound of right forearm, subsequent encounter: Secondary | ICD-10-CM | POA: Diagnosis not present

## 2021-01-12 DIAGNOSIS — I69320 Aphasia following cerebral infarction: Secondary | ICD-10-CM | POA: Diagnosis not present

## 2021-01-12 DIAGNOSIS — E785 Hyperlipidemia, unspecified: Secondary | ICD-10-CM | POA: Diagnosis not present

## 2021-01-12 DIAGNOSIS — M19041 Primary osteoarthritis, right hand: Secondary | ICD-10-CM | POA: Diagnosis not present

## 2021-01-12 NOTE — Telephone Encounter (Signed)
Order for wheelchair faxed to [facility] per request of Dr Ranell Patrick. Notes and face sheet attached.

## 2021-01-12 NOTE — Progress Notes (Signed)
Subjective:    Patient ID: Candice Perez, female    DOB: October 25, 1950, 70 y.o.   MRN: [id number]  HPI Candice Perez is a 70 year old woman who presents for hospital follow-up after CIR admission for MCA CVA.   She has decreased sensation in both feet  Average pain is 6/10 and pain right now is 3/10  In hospital she was found to have prediabetes.   She tries to follow a healthy diet at home  Left foot is painful- she has better sensation in this foot  Ready to try Qutenza today   Pain Inventory Average Pain 6 Pain Right Now 3 My pain is aching  LOCATION OF PAIN  hand,ankles , toes  BOWEL Number of stools per week: 5 Oral laxative use No  Type of laxative n/a Enema or suppository use No  History of colostomy No  Incontinent No   BLADDER  In and out cath, frequency n/a Able to self cath No  Bladder incontinence Yes  Frequent urination No  Leakage with coughing No  Difficulty starting stream No  Incomplete bladder emptying No    Mobility use a cane use a wheelchair transfers alone  Function not employed: date last employed   I need assistance with the following:  shopping  Neuro/Psych bladder control problems weakness numbness trouble walking  Prior Studies N/a  Physicians involved in your care N/a   Family History  Problem Relation Age of Onset   Congestive Heart Failure Mother    Cancer Mother        tongue/sinus   Cancer Father        "in his back"   Other Father        Varicose veins, Arteriosclerosis   Social History   Socioeconomic History   Marital status: Married    Spouse name: Not on file   Number of children: 2   Years of education: 9   Highest education level: Not on file  Occupational History   Occupation: Retired  Tobacco Use   Smoking status: Former    Years: 40.00    Types: Cigarettes    Quit date: 09/17/2010    Years since quitting: 10.3   Smokeless tobacco: Never  Vaping Use   Vaping Use: Never used   Substance and Sexual Activity   Alcohol use: No   Drug use: No   Sexual activity: Not on file  Other Topics Concern   Not on file  Social History Narrative   Lives at home with her husband.   Right-handed.   No daily caffeine use.   Social Determinants of Health   Financial Resource Strain: Not on file  Food Insecurity: Not on file  Transportation Needs: Not on file  Physical Activity: Not on file  Stress: Not on file  Social Connections: Not on file   Past Surgical History:  Procedure Laterality Date   CHOLECYSTECTOMY     Laparoscopic   CYSTOCELE REPAIR     HERNIA REPAIR     IR CT HEAD LTD  12/02/2020   IR PERCUTANEOUS ART THROMBECTOMY/INFUSION INTRACRANIAL INC DIAG ANGIO  12/02/2020   RADIOLOGY WITH ANESTHESIA N/A 12/02/2020   Procedure: IR WITH ANESTHESIA;  Surgeon: Luanne Bras, MD;  Location: [facility];  Service: Radiology;  Laterality: N/A;   TONSILLECTOMY     TOTAL KNEE ARTHROPLASTY Left 08/26/2013   Procedure: LEFT TOTAL KNEE REVISION;  Surgeon: Mauri Pole, MD;  Location: [facility];  Service: Orthopedics;  Laterality: Left;  VARICOSE VEIN SURGERY     Past Medical History:  Diagnosis Date   Arthritis    DVT (deep venous thrombosis) (HCC)    Edema    GERD (gastroesophageal reflux disease)    OCCASIONAL - PRILOSEC IF NEEDED   Hyperlipidemia    Hypertension    no meds now   Hypothyroidism    Varicose veins    BP 119/72 (BP Location: Right Arm)   Pulse (!) 55   Temp 97.6 F (36.4 C) (Oral)   Ht '5\' 7"'$  (1.702 m)   Wt 278 lb (126.1 kg)   SpO2 95%   BMI 43.54 kg/m   Opioid Risk Score:   Fall Risk Score:  `1  Depression screen PHQ 2/9  No flowsheet data found.     Review of Systems  Constitutional: Negative.   HENT: Negative.    Eyes: Negative.   Respiratory: Negative.    Cardiovascular:  Positive for leg swelling.       Limb swelling  Gastrointestinal: Negative.   Endocrine: Negative.   Genitourinary: Negative.   Musculoskeletal:         Pain in ankles , toes,   Skin: Negative.   Allergic/Immunologic: Negative.   Neurological:  Positive for weakness and numbness.  Hematological: Negative.   Psychiatric/Behavioral: Negative.        Objective:   Physical Exam Gen: no distress, normal appearing, weight 278 lbs, BMI 43.54, BP 119/72 HEENT: oral mucosa pink and moist, NCAT Cardio: Reg rate Chest: normal effort, normal rate of breathing Abd: soft, non-distended Ext: no edema Psych: pleasant, normal affect Skin: intact Neuro: Alert and oriented x3 Musculoskeletal: In Wheelchair. Decreased right sided strength     Assessment & Plan:  Candice Perez is a 70 year old woman who presents for hospital follow-up after CIR admission for CVA  1) Prediabetes -avoid sugar, bread, pasta, rice -avoid snacking -try to incorporate into your diet some of the following foods which are good for diabetes: 1) cinnamon- imitates effects of insulin, increasing glucose transport into cells ([place] or [place] cinnamon is best, least processed) 2) nuts- can slow down the blood sugar response of carbohydrate rich foods 3) oatmeal- contains and anti-inflammatory compound avenanthramide 4) whole-milk yogurt (best types are no sugar, [place] yogurt, or goat/sheep yogurt) 5) beans- high in protein, fiber, and vitamins, low glycemic index 6) broccoli- great source of vitamin A and C 7) quinoa- higher in protein and fiber than other grains 8) spinach- high in vitamin A, fiber, and protein 9) olive oil- reduces glucose levels, LDL, and triglycerides 10) salmon- excellent amount of omega-3-fatty acids 11) walnuts- rich in antioxidants 12) apples- high in fiber and quercetin 13) carrots- highly nutritious with low impact on blood sugar 14) eggs- improve HDL (good cholesterol), high in protein, keep you satiated 15) turmeric: improves blood sugars, cardiovascular disease, and protects kidney health 16) garlic: improves blood sugar, blood pressure,  pain 17) tomatoes: highly nutritious with low impact on blood sugar   2) Impaired mobility following L MCA stroke -she requires larger wheelchair- current one she is renting is great- will provide script and fax to company  3) Idiopathic peripheral neuropathy -Discussed Qutenza as an option for neuropathic pain control. Discussed that this is a capsaicin patch, stronger than capsaicin cream. Discussed that it is currently approved for diabetic peripheral neuropathy and post-herpetic neuralgia, but that it has also shown benefit in treating other forms of neuropathy. Provided patient with link to site to learn more about the patch:  CinemaBonus.fr. Discussed that the patch would be placed in office and benefits usually last 3 months. Discussed that unintended exposure to capsaicin can cause severe irritation of eyes, mucous membranes, respiratory tract, and skin, but that Qutenza is a local treatment and does not have the systemic side effects of other nerve medications. Discussed that there may be pain, itching, erythema, and decreased sensory function associated with the application of Qutenza. Side effects usually subside within 1 week. A cold pack of analgesic medications can help with these side effects. Blood pressure can also be increased due to pain associated with administration of the patch.  4 patches of Qutenza was applied to the area of pain. Ice packs were applied during the procedure to ensure patient comfort. Blood pressure was monitored every 15 minutes. The patient tolerated the procedure well. Post-procedure instructions were given and follow-up has been scheduled.

## 2021-01-13 ENCOUNTER — Telehealth: Payer: Self-pay | Admitting: *Deleted

## 2021-01-13 DIAGNOSIS — K5900 Constipation, unspecified: Secondary | ICD-10-CM | POA: Diagnosis not present

## 2021-01-13 DIAGNOSIS — I4891 Unspecified atrial fibrillation: Secondary | ICD-10-CM | POA: Diagnosis not present

## 2021-01-13 DIAGNOSIS — M19042 Primary osteoarthritis, left hand: Secondary | ICD-10-CM | POA: Diagnosis not present

## 2021-01-13 DIAGNOSIS — M21371 Foot drop, right foot: Secondary | ICD-10-CM | POA: Diagnosis not present

## 2021-01-13 DIAGNOSIS — Z7901 Long term (current) use of anticoagulants: Secondary | ICD-10-CM | POA: Diagnosis not present

## 2021-01-13 DIAGNOSIS — I83893 Varicose veins of bilateral lower extremities with other complications: Secondary | ICD-10-CM | POA: Diagnosis not present

## 2021-01-13 DIAGNOSIS — S51801D Unspecified open wound of right forearm, subsequent encounter: Secondary | ICD-10-CM | POA: Diagnosis not present

## 2021-01-13 DIAGNOSIS — N179 Acute kidney failure, unspecified: Secondary | ICD-10-CM | POA: Diagnosis not present

## 2021-01-13 DIAGNOSIS — R1310 Dysphagia, unspecified: Secondary | ICD-10-CM | POA: Diagnosis not present

## 2021-01-13 DIAGNOSIS — K219 Gastro-esophageal reflux disease without esophagitis: Secondary | ICD-10-CM | POA: Diagnosis not present

## 2021-01-13 DIAGNOSIS — E785 Hyperlipidemia, unspecified: Secondary | ICD-10-CM | POA: Diagnosis not present

## 2021-01-13 DIAGNOSIS — N390 Urinary tract infection, site not specified: Secondary | ICD-10-CM | POA: Diagnosis not present

## 2021-01-13 DIAGNOSIS — G8929 Other chronic pain: Secondary | ICD-10-CM | POA: Diagnosis not present

## 2021-01-13 DIAGNOSIS — I69320 Aphasia following cerebral infarction: Secondary | ICD-10-CM | POA: Diagnosis not present

## 2021-01-13 DIAGNOSIS — E039 Hypothyroidism, unspecified: Secondary | ICD-10-CM | POA: Diagnosis not present

## 2021-01-13 DIAGNOSIS — D849 Immunodeficiency, unspecified: Secondary | ICD-10-CM | POA: Diagnosis not present

## 2021-01-13 DIAGNOSIS — I10 Essential (primary) hypertension: Secondary | ICD-10-CM | POA: Diagnosis not present

## 2021-01-13 DIAGNOSIS — U071 COVID-19: Secondary | ICD-10-CM | POA: Diagnosis not present

## 2021-01-13 DIAGNOSIS — I69351 Hemiplegia and hemiparesis following cerebral infarction affecting right dominant side: Secondary | ICD-10-CM | POA: Diagnosis not present

## 2021-01-13 DIAGNOSIS — J9601 Acute respiratory failure with hypoxia: Secondary | ICD-10-CM | POA: Diagnosis not present

## 2021-01-13 DIAGNOSIS — Z6839 Body mass index (BMI) 39.0-39.9, adult: Secondary | ICD-10-CM | POA: Diagnosis not present

## 2021-01-13 DIAGNOSIS — D500 Iron deficiency anemia secondary to blood loss (chronic): Secondary | ICD-10-CM | POA: Diagnosis not present

## 2021-01-13 DIAGNOSIS — M19041 Primary osteoarthritis, right hand: Secondary | ICD-10-CM | POA: Diagnosis not present

## 2021-01-13 NOTE — Telephone Encounter (Signed)
Alden ST from [facility] called for POC for ST 1wk6.  Approval given.

## 2021-01-14 DIAGNOSIS — Z6841 Body Mass Index (BMI) 40.0 and over, adult: Secondary | ICD-10-CM | POA: Diagnosis not present

## 2021-01-14 DIAGNOSIS — I4891 Unspecified atrial fibrillation: Secondary | ICD-10-CM | POA: Diagnosis not present

## 2021-01-14 DIAGNOSIS — I6930 Unspecified sequelae of cerebral infarction: Secondary | ICD-10-CM | POA: Diagnosis not present

## 2021-01-14 DIAGNOSIS — E039 Hypothyroidism, unspecified: Secondary | ICD-10-CM | POA: Diagnosis not present

## 2021-01-18 DIAGNOSIS — I4821 Permanent atrial fibrillation: Secondary | ICD-10-CM | POA: Diagnosis not present

## 2021-01-18 DIAGNOSIS — I5022 Chronic systolic (congestive) heart failure: Secondary | ICD-10-CM | POA: Diagnosis not present

## 2021-01-18 DIAGNOSIS — I10 Essential (primary) hypertension: Secondary | ICD-10-CM | POA: Diagnosis not present

## 2021-01-19 DIAGNOSIS — I4891 Unspecified atrial fibrillation: Secondary | ICD-10-CM | POA: Diagnosis not present

## 2021-01-19 DIAGNOSIS — K5900 Constipation, unspecified: Secondary | ICD-10-CM | POA: Diagnosis not present

## 2021-01-19 DIAGNOSIS — N179 Acute kidney failure, unspecified: Secondary | ICD-10-CM | POA: Diagnosis not present

## 2021-01-19 DIAGNOSIS — K219 Gastro-esophageal reflux disease without esophagitis: Secondary | ICD-10-CM | POA: Diagnosis not present

## 2021-01-19 DIAGNOSIS — D500 Iron deficiency anemia secondary to blood loss (chronic): Secondary | ICD-10-CM | POA: Diagnosis not present

## 2021-01-19 DIAGNOSIS — G8929 Other chronic pain: Secondary | ICD-10-CM | POA: Diagnosis not present

## 2021-01-19 DIAGNOSIS — D849 Immunodeficiency, unspecified: Secondary | ICD-10-CM | POA: Diagnosis not present

## 2021-01-19 DIAGNOSIS — M21371 Foot drop, right foot: Secondary | ICD-10-CM | POA: Diagnosis not present

## 2021-01-19 DIAGNOSIS — E039 Hypothyroidism, unspecified: Secondary | ICD-10-CM | POA: Diagnosis not present

## 2021-01-19 DIAGNOSIS — I69320 Aphasia following cerebral infarction: Secondary | ICD-10-CM | POA: Diagnosis not present

## 2021-01-19 DIAGNOSIS — I69351 Hemiplegia and hemiparesis following cerebral infarction affecting right dominant side: Secondary | ICD-10-CM | POA: Diagnosis not present

## 2021-01-19 DIAGNOSIS — J9601 Acute respiratory failure with hypoxia: Secondary | ICD-10-CM | POA: Diagnosis not present

## 2021-01-19 DIAGNOSIS — I83893 Varicose veins of bilateral lower extremities with other complications: Secondary | ICD-10-CM | POA: Diagnosis not present

## 2021-01-19 DIAGNOSIS — E785 Hyperlipidemia, unspecified: Secondary | ICD-10-CM | POA: Diagnosis not present

## 2021-01-19 DIAGNOSIS — N390 Urinary tract infection, site not specified: Secondary | ICD-10-CM | POA: Diagnosis not present

## 2021-01-19 DIAGNOSIS — Z7901 Long term (current) use of anticoagulants: Secondary | ICD-10-CM | POA: Diagnosis not present

## 2021-01-19 DIAGNOSIS — M19042 Primary osteoarthritis, left hand: Secondary | ICD-10-CM | POA: Diagnosis not present

## 2021-01-19 DIAGNOSIS — I10 Essential (primary) hypertension: Secondary | ICD-10-CM | POA: Diagnosis not present

## 2021-01-19 DIAGNOSIS — M19041 Primary osteoarthritis, right hand: Secondary | ICD-10-CM | POA: Diagnosis not present

## 2021-01-19 DIAGNOSIS — S51801D Unspecified open wound of right forearm, subsequent encounter: Secondary | ICD-10-CM | POA: Diagnosis not present

## 2021-01-19 DIAGNOSIS — U071 COVID-19: Secondary | ICD-10-CM | POA: Diagnosis not present

## 2021-01-19 DIAGNOSIS — R1310 Dysphagia, unspecified: Secondary | ICD-10-CM | POA: Diagnosis not present

## 2021-01-19 DIAGNOSIS — Z6839 Body mass index (BMI) 39.0-39.9, adult: Secondary | ICD-10-CM | POA: Diagnosis not present

## 2021-01-21 DIAGNOSIS — D500 Iron deficiency anemia secondary to blood loss (chronic): Secondary | ICD-10-CM | POA: Diagnosis not present

## 2021-01-21 DIAGNOSIS — I69351 Hemiplegia and hemiparesis following cerebral infarction affecting right dominant side: Secondary | ICD-10-CM | POA: Diagnosis not present

## 2021-01-21 DIAGNOSIS — I4891 Unspecified atrial fibrillation: Secondary | ICD-10-CM | POA: Diagnosis not present

## 2021-01-21 DIAGNOSIS — E785 Hyperlipidemia, unspecified: Secondary | ICD-10-CM | POA: Diagnosis not present

## 2021-01-21 DIAGNOSIS — G8929 Other chronic pain: Secondary | ICD-10-CM | POA: Diagnosis not present

## 2021-01-21 DIAGNOSIS — S51801D Unspecified open wound of right forearm, subsequent encounter: Secondary | ICD-10-CM | POA: Diagnosis not present

## 2021-01-21 DIAGNOSIS — J9601 Acute respiratory failure with hypoxia: Secondary | ICD-10-CM | POA: Diagnosis not present

## 2021-01-21 DIAGNOSIS — M19042 Primary osteoarthritis, left hand: Secondary | ICD-10-CM | POA: Diagnosis not present

## 2021-01-21 DIAGNOSIS — K5900 Constipation, unspecified: Secondary | ICD-10-CM | POA: Diagnosis not present

## 2021-01-21 DIAGNOSIS — M19041 Primary osteoarthritis, right hand: Secondary | ICD-10-CM | POA: Diagnosis not present

## 2021-01-21 DIAGNOSIS — I83893 Varicose veins of bilateral lower extremities with other complications: Secondary | ICD-10-CM | POA: Diagnosis not present

## 2021-01-21 DIAGNOSIS — N179 Acute kidney failure, unspecified: Secondary | ICD-10-CM | POA: Diagnosis not present

## 2021-01-21 DIAGNOSIS — I69320 Aphasia following cerebral infarction: Secondary | ICD-10-CM | POA: Diagnosis not present

## 2021-01-21 DIAGNOSIS — K219 Gastro-esophageal reflux disease without esophagitis: Secondary | ICD-10-CM | POA: Diagnosis not present

## 2021-01-21 DIAGNOSIS — N390 Urinary tract infection, site not specified: Secondary | ICD-10-CM | POA: Diagnosis not present

## 2021-01-21 DIAGNOSIS — E039 Hypothyroidism, unspecified: Secondary | ICD-10-CM | POA: Diagnosis not present

## 2021-01-21 DIAGNOSIS — Z7901 Long term (current) use of anticoagulants: Secondary | ICD-10-CM | POA: Diagnosis not present

## 2021-01-21 DIAGNOSIS — Z6839 Body mass index (BMI) 39.0-39.9, adult: Secondary | ICD-10-CM | POA: Diagnosis not present

## 2021-01-21 DIAGNOSIS — U071 COVID-19: Secondary | ICD-10-CM | POA: Diagnosis not present

## 2021-01-21 DIAGNOSIS — M21371 Foot drop, right foot: Secondary | ICD-10-CM | POA: Diagnosis not present

## 2021-01-21 DIAGNOSIS — R1310 Dysphagia, unspecified: Secondary | ICD-10-CM | POA: Diagnosis not present

## 2021-01-21 DIAGNOSIS — I10 Essential (primary) hypertension: Secondary | ICD-10-CM | POA: Diagnosis not present

## 2021-01-21 DIAGNOSIS — D849 Immunodeficiency, unspecified: Secondary | ICD-10-CM | POA: Diagnosis not present

## 2021-01-24 DIAGNOSIS — N179 Acute kidney failure, unspecified: Secondary | ICD-10-CM | POA: Diagnosis not present

## 2021-01-24 DIAGNOSIS — D849 Immunodeficiency, unspecified: Secondary | ICD-10-CM | POA: Diagnosis not present

## 2021-01-24 DIAGNOSIS — I4891 Unspecified atrial fibrillation: Secondary | ICD-10-CM | POA: Diagnosis not present

## 2021-01-24 DIAGNOSIS — K5900 Constipation, unspecified: Secondary | ICD-10-CM | POA: Diagnosis not present

## 2021-01-24 DIAGNOSIS — U071 COVID-19: Secondary | ICD-10-CM | POA: Diagnosis not present

## 2021-01-24 DIAGNOSIS — K219 Gastro-esophageal reflux disease without esophagitis: Secondary | ICD-10-CM | POA: Diagnosis not present

## 2021-01-24 DIAGNOSIS — G8929 Other chronic pain: Secondary | ICD-10-CM | POA: Diagnosis not present

## 2021-01-24 DIAGNOSIS — M19042 Primary osteoarthritis, left hand: Secondary | ICD-10-CM | POA: Diagnosis not present

## 2021-01-24 DIAGNOSIS — E785 Hyperlipidemia, unspecified: Secondary | ICD-10-CM | POA: Diagnosis not present

## 2021-01-24 DIAGNOSIS — S51801D Unspecified open wound of right forearm, subsequent encounter: Secondary | ICD-10-CM | POA: Diagnosis not present

## 2021-01-24 DIAGNOSIS — J9601 Acute respiratory failure with hypoxia: Secondary | ICD-10-CM | POA: Diagnosis not present

## 2021-01-24 DIAGNOSIS — M21371 Foot drop, right foot: Secondary | ICD-10-CM | POA: Diagnosis not present

## 2021-01-24 DIAGNOSIS — R1310 Dysphagia, unspecified: Secondary | ICD-10-CM | POA: Diagnosis not present

## 2021-01-24 DIAGNOSIS — D500 Iron deficiency anemia secondary to blood loss (chronic): Secondary | ICD-10-CM | POA: Diagnosis not present

## 2021-01-24 DIAGNOSIS — E039 Hypothyroidism, unspecified: Secondary | ICD-10-CM | POA: Diagnosis not present

## 2021-01-24 DIAGNOSIS — M19041 Primary osteoarthritis, right hand: Secondary | ICD-10-CM | POA: Diagnosis not present

## 2021-01-24 DIAGNOSIS — I10 Essential (primary) hypertension: Secondary | ICD-10-CM | POA: Diagnosis not present

## 2021-01-24 DIAGNOSIS — I69320 Aphasia following cerebral infarction: Secondary | ICD-10-CM | POA: Diagnosis not present

## 2021-01-24 DIAGNOSIS — N390 Urinary tract infection, site not specified: Secondary | ICD-10-CM | POA: Diagnosis not present

## 2021-01-24 DIAGNOSIS — Z6839 Body mass index (BMI) 39.0-39.9, adult: Secondary | ICD-10-CM | POA: Diagnosis not present

## 2021-01-24 DIAGNOSIS — Z7901 Long term (current) use of anticoagulants: Secondary | ICD-10-CM | POA: Diagnosis not present

## 2021-01-24 DIAGNOSIS — I69351 Hemiplegia and hemiparesis following cerebral infarction affecting right dominant side: Secondary | ICD-10-CM | POA: Diagnosis not present

## 2021-01-24 DIAGNOSIS — I83893 Varicose veins of bilateral lower extremities with other complications: Secondary | ICD-10-CM | POA: Diagnosis not present

## 2021-01-25 DIAGNOSIS — I10 Essential (primary) hypertension: Secondary | ICD-10-CM | POA: Diagnosis not present

## 2021-01-25 DIAGNOSIS — I4821 Permanent atrial fibrillation: Secondary | ICD-10-CM | POA: Diagnosis not present

## 2021-01-25 DIAGNOSIS — I5022 Chronic systolic (congestive) heart failure: Secondary | ICD-10-CM | POA: Diagnosis not present

## 2021-01-26 DIAGNOSIS — Z7901 Long term (current) use of anticoagulants: Secondary | ICD-10-CM | POA: Diagnosis not present

## 2021-01-26 DIAGNOSIS — E785 Hyperlipidemia, unspecified: Secondary | ICD-10-CM | POA: Diagnosis not present

## 2021-01-26 DIAGNOSIS — U071 COVID-19: Secondary | ICD-10-CM | POA: Diagnosis not present

## 2021-01-26 DIAGNOSIS — G8929 Other chronic pain: Secondary | ICD-10-CM | POA: Diagnosis not present

## 2021-01-26 DIAGNOSIS — D849 Immunodeficiency, unspecified: Secondary | ICD-10-CM | POA: Diagnosis not present

## 2021-01-26 DIAGNOSIS — I69320 Aphasia following cerebral infarction: Secondary | ICD-10-CM | POA: Diagnosis not present

## 2021-01-26 DIAGNOSIS — M19042 Primary osteoarthritis, left hand: Secondary | ICD-10-CM | POA: Diagnosis not present

## 2021-01-26 DIAGNOSIS — K5900 Constipation, unspecified: Secondary | ICD-10-CM | POA: Diagnosis not present

## 2021-01-26 DIAGNOSIS — D500 Iron deficiency anemia secondary to blood loss (chronic): Secondary | ICD-10-CM | POA: Diagnosis not present

## 2021-01-26 DIAGNOSIS — I69351 Hemiplegia and hemiparesis following cerebral infarction affecting right dominant side: Secondary | ICD-10-CM | POA: Diagnosis not present

## 2021-01-26 DIAGNOSIS — N390 Urinary tract infection, site not specified: Secondary | ICD-10-CM | POA: Diagnosis not present

## 2021-01-26 DIAGNOSIS — I4891 Unspecified atrial fibrillation: Secondary | ICD-10-CM | POA: Diagnosis not present

## 2021-01-26 DIAGNOSIS — M21371 Foot drop, right foot: Secondary | ICD-10-CM | POA: Diagnosis not present

## 2021-01-26 DIAGNOSIS — R1310 Dysphagia, unspecified: Secondary | ICD-10-CM | POA: Diagnosis not present

## 2021-01-26 DIAGNOSIS — M19041 Primary osteoarthritis, right hand: Secondary | ICD-10-CM | POA: Diagnosis not present

## 2021-01-26 DIAGNOSIS — S51801D Unspecified open wound of right forearm, subsequent encounter: Secondary | ICD-10-CM | POA: Diagnosis not present

## 2021-01-26 DIAGNOSIS — J9601 Acute respiratory failure with hypoxia: Secondary | ICD-10-CM | POA: Diagnosis not present

## 2021-01-26 DIAGNOSIS — N179 Acute kidney failure, unspecified: Secondary | ICD-10-CM | POA: Diagnosis not present

## 2021-01-26 DIAGNOSIS — E039 Hypothyroidism, unspecified: Secondary | ICD-10-CM | POA: Diagnosis not present

## 2021-01-26 DIAGNOSIS — Z6839 Body mass index (BMI) 39.0-39.9, adult: Secondary | ICD-10-CM | POA: Diagnosis not present

## 2021-01-26 DIAGNOSIS — I10 Essential (primary) hypertension: Secondary | ICD-10-CM | POA: Diagnosis not present

## 2021-01-26 DIAGNOSIS — I83893 Varicose veins of bilateral lower extremities with other complications: Secondary | ICD-10-CM | POA: Diagnosis not present

## 2021-01-26 DIAGNOSIS — K219 Gastro-esophageal reflux disease without esophagitis: Secondary | ICD-10-CM | POA: Diagnosis not present

## 2021-01-28 DIAGNOSIS — N390 Urinary tract infection, site not specified: Secondary | ICD-10-CM | POA: Diagnosis not present

## 2021-01-28 DIAGNOSIS — Z6839 Body mass index (BMI) 39.0-39.9, adult: Secondary | ICD-10-CM | POA: Diagnosis not present

## 2021-01-28 DIAGNOSIS — Z7901 Long term (current) use of anticoagulants: Secondary | ICD-10-CM | POA: Diagnosis not present

## 2021-01-28 DIAGNOSIS — M19042 Primary osteoarthritis, left hand: Secondary | ICD-10-CM | POA: Diagnosis not present

## 2021-01-28 DIAGNOSIS — I10 Essential (primary) hypertension: Secondary | ICD-10-CM | POA: Diagnosis not present

## 2021-01-28 DIAGNOSIS — E785 Hyperlipidemia, unspecified: Secondary | ICD-10-CM | POA: Diagnosis not present

## 2021-01-28 DIAGNOSIS — I69351 Hemiplegia and hemiparesis following cerebral infarction affecting right dominant side: Secondary | ICD-10-CM | POA: Diagnosis not present

## 2021-01-28 DIAGNOSIS — R1310 Dysphagia, unspecified: Secondary | ICD-10-CM | POA: Diagnosis not present

## 2021-01-28 DIAGNOSIS — S51801D Unspecified open wound of right forearm, subsequent encounter: Secondary | ICD-10-CM | POA: Diagnosis not present

## 2021-01-28 DIAGNOSIS — U071 COVID-19: Secondary | ICD-10-CM | POA: Diagnosis not present

## 2021-01-28 DIAGNOSIS — D500 Iron deficiency anemia secondary to blood loss (chronic): Secondary | ICD-10-CM | POA: Diagnosis not present

## 2021-01-28 DIAGNOSIS — I4891 Unspecified atrial fibrillation: Secondary | ICD-10-CM | POA: Diagnosis not present

## 2021-01-28 DIAGNOSIS — M21371 Foot drop, right foot: Secondary | ICD-10-CM | POA: Diagnosis not present

## 2021-01-28 DIAGNOSIS — N179 Acute kidney failure, unspecified: Secondary | ICD-10-CM | POA: Diagnosis not present

## 2021-01-28 DIAGNOSIS — I69320 Aphasia following cerebral infarction: Secondary | ICD-10-CM | POA: Diagnosis not present

## 2021-01-28 DIAGNOSIS — M19041 Primary osteoarthritis, right hand: Secondary | ICD-10-CM | POA: Diagnosis not present

## 2021-01-28 DIAGNOSIS — D849 Immunodeficiency, unspecified: Secondary | ICD-10-CM | POA: Diagnosis not present

## 2021-01-28 DIAGNOSIS — K5900 Constipation, unspecified: Secondary | ICD-10-CM | POA: Diagnosis not present

## 2021-01-28 DIAGNOSIS — E039 Hypothyroidism, unspecified: Secondary | ICD-10-CM | POA: Diagnosis not present

## 2021-01-28 DIAGNOSIS — K219 Gastro-esophageal reflux disease without esophagitis: Secondary | ICD-10-CM | POA: Diagnosis not present

## 2021-01-28 DIAGNOSIS — G8929 Other chronic pain: Secondary | ICD-10-CM | POA: Diagnosis not present

## 2021-01-28 DIAGNOSIS — I83893 Varicose veins of bilateral lower extremities with other complications: Secondary | ICD-10-CM | POA: Diagnosis not present

## 2021-01-28 DIAGNOSIS — J9601 Acute respiratory failure with hypoxia: Secondary | ICD-10-CM | POA: Diagnosis not present

## 2021-01-31 DIAGNOSIS — N390 Urinary tract infection, site not specified: Secondary | ICD-10-CM | POA: Diagnosis not present

## 2021-01-31 DIAGNOSIS — D849 Immunodeficiency, unspecified: Secondary | ICD-10-CM | POA: Diagnosis not present

## 2021-01-31 DIAGNOSIS — R1310 Dysphagia, unspecified: Secondary | ICD-10-CM | POA: Diagnosis not present

## 2021-01-31 DIAGNOSIS — E039 Hypothyroidism, unspecified: Secondary | ICD-10-CM | POA: Diagnosis not present

## 2021-01-31 DIAGNOSIS — U071 COVID-19: Secondary | ICD-10-CM | POA: Diagnosis not present

## 2021-01-31 DIAGNOSIS — E785 Hyperlipidemia, unspecified: Secondary | ICD-10-CM | POA: Diagnosis not present

## 2021-01-31 DIAGNOSIS — I69351 Hemiplegia and hemiparesis following cerebral infarction affecting right dominant side: Secondary | ICD-10-CM | POA: Diagnosis not present

## 2021-01-31 DIAGNOSIS — I10 Essential (primary) hypertension: Secondary | ICD-10-CM | POA: Diagnosis not present

## 2021-01-31 DIAGNOSIS — D500 Iron deficiency anemia secondary to blood loss (chronic): Secondary | ICD-10-CM | POA: Diagnosis not present

## 2021-01-31 DIAGNOSIS — M19041 Primary osteoarthritis, right hand: Secondary | ICD-10-CM | POA: Diagnosis not present

## 2021-01-31 DIAGNOSIS — N179 Acute kidney failure, unspecified: Secondary | ICD-10-CM | POA: Diagnosis not present

## 2021-01-31 DIAGNOSIS — J9601 Acute respiratory failure with hypoxia: Secondary | ICD-10-CM | POA: Diagnosis not present

## 2021-01-31 DIAGNOSIS — M19042 Primary osteoarthritis, left hand: Secondary | ICD-10-CM | POA: Diagnosis not present

## 2021-01-31 DIAGNOSIS — G8929 Other chronic pain: Secondary | ICD-10-CM | POA: Diagnosis not present

## 2021-01-31 DIAGNOSIS — Z7901 Long term (current) use of anticoagulants: Secondary | ICD-10-CM | POA: Diagnosis not present

## 2021-01-31 DIAGNOSIS — K219 Gastro-esophageal reflux disease without esophagitis: Secondary | ICD-10-CM | POA: Diagnosis not present

## 2021-01-31 DIAGNOSIS — Z6839 Body mass index (BMI) 39.0-39.9, adult: Secondary | ICD-10-CM | POA: Diagnosis not present

## 2021-01-31 DIAGNOSIS — S51801D Unspecified open wound of right forearm, subsequent encounter: Secondary | ICD-10-CM | POA: Diagnosis not present

## 2021-01-31 DIAGNOSIS — M21371 Foot drop, right foot: Secondary | ICD-10-CM | POA: Diagnosis not present

## 2021-01-31 DIAGNOSIS — K5900 Constipation, unspecified: Secondary | ICD-10-CM | POA: Diagnosis not present

## 2021-01-31 DIAGNOSIS — I4891 Unspecified atrial fibrillation: Secondary | ICD-10-CM | POA: Diagnosis not present

## 2021-01-31 DIAGNOSIS — I83893 Varicose veins of bilateral lower extremities with other complications: Secondary | ICD-10-CM | POA: Diagnosis not present

## 2021-01-31 DIAGNOSIS — I69320 Aphasia following cerebral infarction: Secondary | ICD-10-CM | POA: Diagnosis not present

## 2021-02-02 DIAGNOSIS — D849 Immunodeficiency, unspecified: Secondary | ICD-10-CM | POA: Diagnosis not present

## 2021-02-02 DIAGNOSIS — G8929 Other chronic pain: Secondary | ICD-10-CM | POA: Diagnosis not present

## 2021-02-02 DIAGNOSIS — M21371 Foot drop, right foot: Secondary | ICD-10-CM | POA: Diagnosis not present

## 2021-02-02 DIAGNOSIS — U071 COVID-19: Secondary | ICD-10-CM | POA: Diagnosis not present

## 2021-02-02 DIAGNOSIS — N390 Urinary tract infection, site not specified: Secondary | ICD-10-CM | POA: Diagnosis not present

## 2021-02-02 DIAGNOSIS — M19041 Primary osteoarthritis, right hand: Secondary | ICD-10-CM | POA: Diagnosis not present

## 2021-02-02 DIAGNOSIS — I69320 Aphasia following cerebral infarction: Secondary | ICD-10-CM | POA: Diagnosis not present

## 2021-02-02 DIAGNOSIS — J9601 Acute respiratory failure with hypoxia: Secondary | ICD-10-CM | POA: Diagnosis not present

## 2021-02-02 DIAGNOSIS — N179 Acute kidney failure, unspecified: Secondary | ICD-10-CM | POA: Diagnosis not present

## 2021-02-02 DIAGNOSIS — I83893 Varicose veins of bilateral lower extremities with other complications: Secondary | ICD-10-CM | POA: Diagnosis not present

## 2021-02-02 DIAGNOSIS — M19042 Primary osteoarthritis, left hand: Secondary | ICD-10-CM | POA: Diagnosis not present

## 2021-02-02 DIAGNOSIS — K219 Gastro-esophageal reflux disease without esophagitis: Secondary | ICD-10-CM | POA: Diagnosis not present

## 2021-02-02 DIAGNOSIS — I10 Essential (primary) hypertension: Secondary | ICD-10-CM | POA: Diagnosis not present

## 2021-02-02 DIAGNOSIS — I69351 Hemiplegia and hemiparesis following cerebral infarction affecting right dominant side: Secondary | ICD-10-CM | POA: Diagnosis not present

## 2021-02-02 DIAGNOSIS — Z7901 Long term (current) use of anticoagulants: Secondary | ICD-10-CM | POA: Diagnosis not present

## 2021-02-02 DIAGNOSIS — Z6839 Body mass index (BMI) 39.0-39.9, adult: Secondary | ICD-10-CM | POA: Diagnosis not present

## 2021-02-02 DIAGNOSIS — K5900 Constipation, unspecified: Secondary | ICD-10-CM | POA: Diagnosis not present

## 2021-02-02 DIAGNOSIS — E039 Hypothyroidism, unspecified: Secondary | ICD-10-CM | POA: Diagnosis not present

## 2021-02-02 DIAGNOSIS — I4891 Unspecified atrial fibrillation: Secondary | ICD-10-CM | POA: Diagnosis not present

## 2021-02-02 DIAGNOSIS — E785 Hyperlipidemia, unspecified: Secondary | ICD-10-CM | POA: Diagnosis not present

## 2021-02-02 DIAGNOSIS — D500 Iron deficiency anemia secondary to blood loss (chronic): Secondary | ICD-10-CM | POA: Diagnosis not present

## 2021-02-02 DIAGNOSIS — S51801D Unspecified open wound of right forearm, subsequent encounter: Secondary | ICD-10-CM | POA: Diagnosis not present

## 2021-02-02 DIAGNOSIS — R1310 Dysphagia, unspecified: Secondary | ICD-10-CM | POA: Diagnosis not present

## 2021-02-03 DIAGNOSIS — G8929 Other chronic pain: Secondary | ICD-10-CM | POA: Diagnosis not present

## 2021-02-03 DIAGNOSIS — N390 Urinary tract infection, site not specified: Secondary | ICD-10-CM | POA: Diagnosis not present

## 2021-02-03 DIAGNOSIS — M21371 Foot drop, right foot: Secondary | ICD-10-CM | POA: Diagnosis not present

## 2021-02-03 DIAGNOSIS — J9601 Acute respiratory failure with hypoxia: Secondary | ICD-10-CM | POA: Diagnosis not present

## 2021-02-03 DIAGNOSIS — U071 COVID-19: Secondary | ICD-10-CM | POA: Diagnosis not present

## 2021-02-03 DIAGNOSIS — K219 Gastro-esophageal reflux disease without esophagitis: Secondary | ICD-10-CM | POA: Diagnosis not present

## 2021-02-03 DIAGNOSIS — I69320 Aphasia following cerebral infarction: Secondary | ICD-10-CM | POA: Diagnosis not present

## 2021-02-03 DIAGNOSIS — Z6839 Body mass index (BMI) 39.0-39.9, adult: Secondary | ICD-10-CM | POA: Diagnosis not present

## 2021-02-03 DIAGNOSIS — I69351 Hemiplegia and hemiparesis following cerebral infarction affecting right dominant side: Secondary | ICD-10-CM | POA: Diagnosis not present

## 2021-02-03 DIAGNOSIS — E039 Hypothyroidism, unspecified: Secondary | ICD-10-CM | POA: Diagnosis not present

## 2021-02-03 DIAGNOSIS — E785 Hyperlipidemia, unspecified: Secondary | ICD-10-CM | POA: Diagnosis not present

## 2021-02-03 DIAGNOSIS — S51801D Unspecified open wound of right forearm, subsequent encounter: Secondary | ICD-10-CM | POA: Diagnosis not present

## 2021-02-03 DIAGNOSIS — D849 Immunodeficiency, unspecified: Secondary | ICD-10-CM | POA: Diagnosis not present

## 2021-02-03 DIAGNOSIS — K5900 Constipation, unspecified: Secondary | ICD-10-CM | POA: Diagnosis not present

## 2021-02-03 DIAGNOSIS — M19042 Primary osteoarthritis, left hand: Secondary | ICD-10-CM | POA: Diagnosis not present

## 2021-02-03 DIAGNOSIS — I4891 Unspecified atrial fibrillation: Secondary | ICD-10-CM | POA: Diagnosis not present

## 2021-02-03 DIAGNOSIS — D500 Iron deficiency anemia secondary to blood loss (chronic): Secondary | ICD-10-CM | POA: Diagnosis not present

## 2021-02-03 DIAGNOSIS — I83893 Varicose veins of bilateral lower extremities with other complications: Secondary | ICD-10-CM | POA: Diagnosis not present

## 2021-02-03 DIAGNOSIS — N179 Acute kidney failure, unspecified: Secondary | ICD-10-CM | POA: Diagnosis not present

## 2021-02-03 DIAGNOSIS — R1310 Dysphagia, unspecified: Secondary | ICD-10-CM | POA: Diagnosis not present

## 2021-02-03 DIAGNOSIS — I10 Essential (primary) hypertension: Secondary | ICD-10-CM | POA: Diagnosis not present

## 2021-02-03 DIAGNOSIS — Z7901 Long term (current) use of anticoagulants: Secondary | ICD-10-CM | POA: Diagnosis not present

## 2021-02-03 DIAGNOSIS — M19041 Primary osteoarthritis, right hand: Secondary | ICD-10-CM | POA: Diagnosis not present

## 2021-02-07 DIAGNOSIS — I4891 Unspecified atrial fibrillation: Secondary | ICD-10-CM | POA: Diagnosis not present

## 2021-02-07 DIAGNOSIS — I10 Essential (primary) hypertension: Secondary | ICD-10-CM | POA: Diagnosis not present

## 2021-02-07 DIAGNOSIS — M21371 Foot drop, right foot: Secondary | ICD-10-CM | POA: Diagnosis not present

## 2021-02-07 DIAGNOSIS — Z7901 Long term (current) use of anticoagulants: Secondary | ICD-10-CM | POA: Diagnosis not present

## 2021-02-07 DIAGNOSIS — S51801D Unspecified open wound of right forearm, subsequent encounter: Secondary | ICD-10-CM | POA: Diagnosis not present

## 2021-02-07 DIAGNOSIS — N179 Acute kidney failure, unspecified: Secondary | ICD-10-CM | POA: Diagnosis not present

## 2021-02-07 DIAGNOSIS — I69351 Hemiplegia and hemiparesis following cerebral infarction affecting right dominant side: Secondary | ICD-10-CM | POA: Diagnosis not present

## 2021-02-07 DIAGNOSIS — M19041 Primary osteoarthritis, right hand: Secondary | ICD-10-CM | POA: Diagnosis not present

## 2021-02-07 DIAGNOSIS — N390 Urinary tract infection, site not specified: Secondary | ICD-10-CM | POA: Diagnosis not present

## 2021-02-07 DIAGNOSIS — Z6839 Body mass index (BMI) 39.0-39.9, adult: Secondary | ICD-10-CM | POA: Diagnosis not present

## 2021-02-07 DIAGNOSIS — K219 Gastro-esophageal reflux disease without esophagitis: Secondary | ICD-10-CM | POA: Diagnosis not present

## 2021-02-07 DIAGNOSIS — I83893 Varicose veins of bilateral lower extremities with other complications: Secondary | ICD-10-CM | POA: Diagnosis not present

## 2021-02-07 DIAGNOSIS — K5900 Constipation, unspecified: Secondary | ICD-10-CM | POA: Diagnosis not present

## 2021-02-07 DIAGNOSIS — I69320 Aphasia following cerebral infarction: Secondary | ICD-10-CM | POA: Diagnosis not present

## 2021-02-07 DIAGNOSIS — E785 Hyperlipidemia, unspecified: Secondary | ICD-10-CM | POA: Diagnosis not present

## 2021-02-07 DIAGNOSIS — U071 COVID-19: Secondary | ICD-10-CM | POA: Diagnosis not present

## 2021-02-07 DIAGNOSIS — D500 Iron deficiency anemia secondary to blood loss (chronic): Secondary | ICD-10-CM | POA: Diagnosis not present

## 2021-02-07 DIAGNOSIS — J9601 Acute respiratory failure with hypoxia: Secondary | ICD-10-CM | POA: Diagnosis not present

## 2021-02-07 DIAGNOSIS — E039 Hypothyroidism, unspecified: Secondary | ICD-10-CM | POA: Diagnosis not present

## 2021-02-07 DIAGNOSIS — D849 Immunodeficiency, unspecified: Secondary | ICD-10-CM | POA: Diagnosis not present

## 2021-02-07 DIAGNOSIS — R1310 Dysphagia, unspecified: Secondary | ICD-10-CM | POA: Diagnosis not present

## 2021-02-07 DIAGNOSIS — G8929 Other chronic pain: Secondary | ICD-10-CM | POA: Diagnosis not present

## 2021-02-07 DIAGNOSIS — M19042 Primary osteoarthritis, left hand: Secondary | ICD-10-CM | POA: Diagnosis not present

## 2021-02-09 DIAGNOSIS — U071 COVID-19: Secondary | ICD-10-CM | POA: Diagnosis not present

## 2021-02-09 DIAGNOSIS — M19042 Primary osteoarthritis, left hand: Secondary | ICD-10-CM | POA: Diagnosis not present

## 2021-02-09 DIAGNOSIS — N390 Urinary tract infection, site not specified: Secondary | ICD-10-CM | POA: Diagnosis not present

## 2021-02-09 DIAGNOSIS — S51801D Unspecified open wound of right forearm, subsequent encounter: Secondary | ICD-10-CM | POA: Diagnosis not present

## 2021-02-09 DIAGNOSIS — M19041 Primary osteoarthritis, right hand: Secondary | ICD-10-CM | POA: Diagnosis not present

## 2021-02-09 DIAGNOSIS — E039 Hypothyroidism, unspecified: Secondary | ICD-10-CM | POA: Diagnosis not present

## 2021-02-09 DIAGNOSIS — M21371 Foot drop, right foot: Secondary | ICD-10-CM | POA: Diagnosis not present

## 2021-02-09 DIAGNOSIS — D500 Iron deficiency anemia secondary to blood loss (chronic): Secondary | ICD-10-CM | POA: Diagnosis not present

## 2021-02-09 DIAGNOSIS — J9601 Acute respiratory failure with hypoxia: Secondary | ICD-10-CM | POA: Diagnosis not present

## 2021-02-09 DIAGNOSIS — I69351 Hemiplegia and hemiparesis following cerebral infarction affecting right dominant side: Secondary | ICD-10-CM | POA: Diagnosis not present

## 2021-02-09 DIAGNOSIS — I4891 Unspecified atrial fibrillation: Secondary | ICD-10-CM | POA: Diagnosis not present

## 2021-02-09 DIAGNOSIS — K219 Gastro-esophageal reflux disease without esophagitis: Secondary | ICD-10-CM | POA: Diagnosis not present

## 2021-02-09 DIAGNOSIS — I10 Essential (primary) hypertension: Secondary | ICD-10-CM | POA: Diagnosis not present

## 2021-02-09 DIAGNOSIS — Z6839 Body mass index (BMI) 39.0-39.9, adult: Secondary | ICD-10-CM | POA: Diagnosis not present

## 2021-02-09 DIAGNOSIS — N179 Acute kidney failure, unspecified: Secondary | ICD-10-CM | POA: Diagnosis not present

## 2021-02-09 DIAGNOSIS — R1310 Dysphagia, unspecified: Secondary | ICD-10-CM | POA: Diagnosis not present

## 2021-02-09 DIAGNOSIS — I83893 Varicose veins of bilateral lower extremities with other complications: Secondary | ICD-10-CM | POA: Diagnosis not present

## 2021-02-09 DIAGNOSIS — Z7901 Long term (current) use of anticoagulants: Secondary | ICD-10-CM | POA: Diagnosis not present

## 2021-02-09 DIAGNOSIS — G8929 Other chronic pain: Secondary | ICD-10-CM | POA: Diagnosis not present

## 2021-02-09 DIAGNOSIS — E785 Hyperlipidemia, unspecified: Secondary | ICD-10-CM | POA: Diagnosis not present

## 2021-02-09 DIAGNOSIS — I69320 Aphasia following cerebral infarction: Secondary | ICD-10-CM | POA: Diagnosis not present

## 2021-02-09 DIAGNOSIS — D849 Immunodeficiency, unspecified: Secondary | ICD-10-CM | POA: Diagnosis not present

## 2021-02-09 DIAGNOSIS — K5900 Constipation, unspecified: Secondary | ICD-10-CM | POA: Diagnosis not present

## 2021-02-11 DIAGNOSIS — I4891 Unspecified atrial fibrillation: Secondary | ICD-10-CM | POA: Diagnosis not present

## 2021-02-11 DIAGNOSIS — I10 Essential (primary) hypertension: Secondary | ICD-10-CM | POA: Diagnosis not present

## 2021-02-11 DIAGNOSIS — E039 Hypothyroidism, unspecified: Secondary | ICD-10-CM | POA: Diagnosis not present

## 2021-02-14 DIAGNOSIS — M19042 Primary osteoarthritis, left hand: Secondary | ICD-10-CM | POA: Diagnosis not present

## 2021-02-14 DIAGNOSIS — N390 Urinary tract infection, site not specified: Secondary | ICD-10-CM | POA: Diagnosis not present

## 2021-02-14 DIAGNOSIS — M19041 Primary osteoarthritis, right hand: Secondary | ICD-10-CM | POA: Diagnosis not present

## 2021-02-14 DIAGNOSIS — J9601 Acute respiratory failure with hypoxia: Secondary | ICD-10-CM | POA: Diagnosis not present

## 2021-02-14 DIAGNOSIS — U071 COVID-19: Secondary | ICD-10-CM | POA: Diagnosis not present

## 2021-02-14 DIAGNOSIS — M21371 Foot drop, right foot: Secondary | ICD-10-CM | POA: Diagnosis not present

## 2021-02-14 DIAGNOSIS — D500 Iron deficiency anemia secondary to blood loss (chronic): Secondary | ICD-10-CM | POA: Diagnosis not present

## 2021-02-14 DIAGNOSIS — I69320 Aphasia following cerebral infarction: Secondary | ICD-10-CM | POA: Diagnosis not present

## 2021-02-14 DIAGNOSIS — S51801D Unspecified open wound of right forearm, subsequent encounter: Secondary | ICD-10-CM | POA: Diagnosis not present

## 2021-02-14 DIAGNOSIS — I4891 Unspecified atrial fibrillation: Secondary | ICD-10-CM | POA: Diagnosis not present

## 2021-02-14 DIAGNOSIS — G8929 Other chronic pain: Secondary | ICD-10-CM | POA: Diagnosis not present

## 2021-02-14 DIAGNOSIS — D849 Immunodeficiency, unspecified: Secondary | ICD-10-CM | POA: Diagnosis not present

## 2021-02-14 DIAGNOSIS — E039 Hypothyroidism, unspecified: Secondary | ICD-10-CM | POA: Diagnosis not present

## 2021-02-14 DIAGNOSIS — R1310 Dysphagia, unspecified: Secondary | ICD-10-CM | POA: Diagnosis not present

## 2021-02-14 DIAGNOSIS — I83893 Varicose veins of bilateral lower extremities with other complications: Secondary | ICD-10-CM | POA: Diagnosis not present

## 2021-02-14 DIAGNOSIS — K219 Gastro-esophageal reflux disease without esophagitis: Secondary | ICD-10-CM | POA: Diagnosis not present

## 2021-02-14 DIAGNOSIS — N179 Acute kidney failure, unspecified: Secondary | ICD-10-CM | POA: Diagnosis not present

## 2021-02-14 DIAGNOSIS — I10 Essential (primary) hypertension: Secondary | ICD-10-CM | POA: Diagnosis not present

## 2021-02-14 DIAGNOSIS — I69351 Hemiplegia and hemiparesis following cerebral infarction affecting right dominant side: Secondary | ICD-10-CM | POA: Diagnosis not present

## 2021-02-14 DIAGNOSIS — K5900 Constipation, unspecified: Secondary | ICD-10-CM | POA: Diagnosis not present

## 2021-02-14 DIAGNOSIS — E785 Hyperlipidemia, unspecified: Secondary | ICD-10-CM | POA: Diagnosis not present

## 2021-02-14 DIAGNOSIS — Z7901 Long term (current) use of anticoagulants: Secondary | ICD-10-CM | POA: Diagnosis not present

## 2021-02-14 DIAGNOSIS — Z6839 Body mass index (BMI) 39.0-39.9, adult: Secondary | ICD-10-CM | POA: Diagnosis not present

## 2021-02-16 DIAGNOSIS — K5900 Constipation, unspecified: Secondary | ICD-10-CM | POA: Diagnosis not present

## 2021-02-16 DIAGNOSIS — Z7901 Long term (current) use of anticoagulants: Secondary | ICD-10-CM | POA: Diagnosis not present

## 2021-02-16 DIAGNOSIS — E785 Hyperlipidemia, unspecified: Secondary | ICD-10-CM | POA: Diagnosis not present

## 2021-02-16 DIAGNOSIS — I10 Essential (primary) hypertension: Secondary | ICD-10-CM | POA: Diagnosis not present

## 2021-02-16 DIAGNOSIS — D500 Iron deficiency anemia secondary to blood loss (chronic): Secondary | ICD-10-CM | POA: Diagnosis not present

## 2021-02-16 DIAGNOSIS — J9601 Acute respiratory failure with hypoxia: Secondary | ICD-10-CM | POA: Diagnosis not present

## 2021-02-16 DIAGNOSIS — M21371 Foot drop, right foot: Secondary | ICD-10-CM | POA: Diagnosis not present

## 2021-02-16 DIAGNOSIS — M19042 Primary osteoarthritis, left hand: Secondary | ICD-10-CM | POA: Diagnosis not present

## 2021-02-16 DIAGNOSIS — I69320 Aphasia following cerebral infarction: Secondary | ICD-10-CM | POA: Diagnosis not present

## 2021-02-16 DIAGNOSIS — N179 Acute kidney failure, unspecified: Secondary | ICD-10-CM | POA: Diagnosis not present

## 2021-02-16 DIAGNOSIS — N390 Urinary tract infection, site not specified: Secondary | ICD-10-CM | POA: Diagnosis not present

## 2021-02-16 DIAGNOSIS — K219 Gastro-esophageal reflux disease without esophagitis: Secondary | ICD-10-CM | POA: Diagnosis not present

## 2021-02-16 DIAGNOSIS — I69351 Hemiplegia and hemiparesis following cerebral infarction affecting right dominant side: Secondary | ICD-10-CM | POA: Diagnosis not present

## 2021-02-16 DIAGNOSIS — R1310 Dysphagia, unspecified: Secondary | ICD-10-CM | POA: Diagnosis not present

## 2021-02-16 DIAGNOSIS — M19041 Primary osteoarthritis, right hand: Secondary | ICD-10-CM | POA: Diagnosis not present

## 2021-02-16 DIAGNOSIS — I83893 Varicose veins of bilateral lower extremities with other complications: Secondary | ICD-10-CM | POA: Diagnosis not present

## 2021-02-16 DIAGNOSIS — D849 Immunodeficiency, unspecified: Secondary | ICD-10-CM | POA: Diagnosis not present

## 2021-02-16 DIAGNOSIS — S51801D Unspecified open wound of right forearm, subsequent encounter: Secondary | ICD-10-CM | POA: Diagnosis not present

## 2021-02-16 DIAGNOSIS — I4891 Unspecified atrial fibrillation: Secondary | ICD-10-CM | POA: Diagnosis not present

## 2021-02-16 DIAGNOSIS — Z6839 Body mass index (BMI) 39.0-39.9, adult: Secondary | ICD-10-CM | POA: Diagnosis not present

## 2021-02-16 DIAGNOSIS — U071 COVID-19: Secondary | ICD-10-CM | POA: Diagnosis not present

## 2021-02-16 DIAGNOSIS — G8929 Other chronic pain: Secondary | ICD-10-CM | POA: Diagnosis not present

## 2021-02-16 DIAGNOSIS — E039 Hypothyroidism, unspecified: Secondary | ICD-10-CM | POA: Diagnosis not present

## 2021-03-04 ENCOUNTER — Telehealth: Payer: Self-pay | Admitting: Physical Medicine and Rehabilitation

## 2021-03-04 DIAGNOSIS — D500 Iron deficiency anemia secondary to blood loss (chronic): Secondary | ICD-10-CM | POA: Diagnosis not present

## 2021-03-04 DIAGNOSIS — I69351 Hemiplegia and hemiparesis following cerebral infarction affecting right dominant side: Secondary | ICD-10-CM | POA: Diagnosis not present

## 2021-03-04 DIAGNOSIS — I4891 Unspecified atrial fibrillation: Secondary | ICD-10-CM | POA: Diagnosis not present

## 2021-03-04 DIAGNOSIS — I10 Essential (primary) hypertension: Secondary | ICD-10-CM | POA: Diagnosis not present

## 2021-03-04 DIAGNOSIS — N179 Acute kidney failure, unspecified: Secondary | ICD-10-CM | POA: Diagnosis not present

## 2021-03-04 DIAGNOSIS — R1310 Dysphagia, unspecified: Secondary | ICD-10-CM | POA: Diagnosis not present

## 2021-03-04 DIAGNOSIS — I83893 Varicose veins of bilateral lower extremities with other complications: Secondary | ICD-10-CM | POA: Diagnosis not present

## 2021-03-04 DIAGNOSIS — M19042 Primary osteoarthritis, left hand: Secondary | ICD-10-CM | POA: Diagnosis not present

## 2021-03-04 DIAGNOSIS — Z7901 Long term (current) use of anticoagulants: Secondary | ICD-10-CM | POA: Diagnosis not present

## 2021-03-04 DIAGNOSIS — M21371 Foot drop, right foot: Secondary | ICD-10-CM | POA: Diagnosis not present

## 2021-03-04 DIAGNOSIS — M19041 Primary osteoarthritis, right hand: Secondary | ICD-10-CM | POA: Diagnosis not present

## 2021-03-04 DIAGNOSIS — G8929 Other chronic pain: Secondary | ICD-10-CM | POA: Diagnosis not present

## 2021-03-04 DIAGNOSIS — S51801D Unspecified open wound of right forearm, subsequent encounter: Secondary | ICD-10-CM | POA: Diagnosis not present

## 2021-03-04 DIAGNOSIS — J9601 Acute respiratory failure with hypoxia: Secondary | ICD-10-CM | POA: Diagnosis not present

## 2021-03-04 DIAGNOSIS — E785 Hyperlipidemia, unspecified: Secondary | ICD-10-CM | POA: Diagnosis not present

## 2021-03-04 DIAGNOSIS — K5900 Constipation, unspecified: Secondary | ICD-10-CM | POA: Diagnosis not present

## 2021-03-04 DIAGNOSIS — U071 COVID-19: Secondary | ICD-10-CM | POA: Diagnosis not present

## 2021-03-04 DIAGNOSIS — I69320 Aphasia following cerebral infarction: Secondary | ICD-10-CM | POA: Diagnosis not present

## 2021-03-04 DIAGNOSIS — E039 Hypothyroidism, unspecified: Secondary | ICD-10-CM | POA: Diagnosis not present

## 2021-03-04 DIAGNOSIS — N390 Urinary tract infection, site not specified: Secondary | ICD-10-CM | POA: Diagnosis not present

## 2021-03-04 DIAGNOSIS — Z6839 Body mass index (BMI) 39.0-39.9, adult: Secondary | ICD-10-CM | POA: Diagnosis not present

## 2021-03-04 DIAGNOSIS — D849 Immunodeficiency, unspecified: Secondary | ICD-10-CM | POA: Diagnosis not present

## 2021-03-04 DIAGNOSIS — K219 Gastro-esophageal reflux disease without esophagitis: Secondary | ICD-10-CM | POA: Diagnosis not present

## 2021-03-04 NOTE — Telephone Encounter (Signed)
Approval given

## 2021-03-04 NOTE — Telephone Encounter (Signed)
Mickel Baas OT with [facility] needs to get verbal orders to see patient 1W6.  Please call her at [phone].

## 2021-03-08 DIAGNOSIS — I5022 Chronic systolic (congestive) heart failure: Secondary | ICD-10-CM | POA: Diagnosis not present

## 2021-03-08 DIAGNOSIS — I10 Essential (primary) hypertension: Secondary | ICD-10-CM | POA: Diagnosis not present

## 2021-03-08 DIAGNOSIS — I4821 Permanent atrial fibrillation: Secondary | ICD-10-CM | POA: Diagnosis not present

## 2021-03-10 DIAGNOSIS — R1310 Dysphagia, unspecified: Secondary | ICD-10-CM | POA: Diagnosis not present

## 2021-03-10 DIAGNOSIS — E785 Hyperlipidemia, unspecified: Secondary | ICD-10-CM | POA: Diagnosis not present

## 2021-03-10 DIAGNOSIS — M19041 Primary osteoarthritis, right hand: Secondary | ICD-10-CM | POA: Diagnosis not present

## 2021-03-10 DIAGNOSIS — K219 Gastro-esophageal reflux disease without esophagitis: Secondary | ICD-10-CM | POA: Diagnosis not present

## 2021-03-10 DIAGNOSIS — D849 Immunodeficiency, unspecified: Secondary | ICD-10-CM | POA: Diagnosis not present

## 2021-03-10 DIAGNOSIS — K5900 Constipation, unspecified: Secondary | ICD-10-CM | POA: Diagnosis not present

## 2021-03-10 DIAGNOSIS — I69320 Aphasia following cerebral infarction: Secondary | ICD-10-CM | POA: Diagnosis not present

## 2021-03-10 DIAGNOSIS — M21371 Foot drop, right foot: Secondary | ICD-10-CM | POA: Diagnosis not present

## 2021-03-10 DIAGNOSIS — E039 Hypothyroidism, unspecified: Secondary | ICD-10-CM | POA: Diagnosis not present

## 2021-03-10 DIAGNOSIS — Z86718 Personal history of other venous thrombosis and embolism: Secondary | ICD-10-CM | POA: Diagnosis not present

## 2021-03-10 DIAGNOSIS — D500 Iron deficiency anemia secondary to blood loss (chronic): Secondary | ICD-10-CM | POA: Diagnosis not present

## 2021-03-10 DIAGNOSIS — I4891 Unspecified atrial fibrillation: Secondary | ICD-10-CM | POA: Diagnosis not present

## 2021-03-10 DIAGNOSIS — Z79891 Long term (current) use of opiate analgesic: Secondary | ICD-10-CM | POA: Diagnosis not present

## 2021-03-10 DIAGNOSIS — N179 Acute kidney failure, unspecified: Secondary | ICD-10-CM | POA: Diagnosis not present

## 2021-03-10 DIAGNOSIS — J9601 Acute respiratory failure with hypoxia: Secondary | ICD-10-CM | POA: Diagnosis not present

## 2021-03-10 DIAGNOSIS — Z8616 Personal history of COVID-19: Secondary | ICD-10-CM | POA: Diagnosis not present

## 2021-03-10 DIAGNOSIS — G8928 Other chronic postprocedural pain: Secondary | ICD-10-CM | POA: Diagnosis not present

## 2021-03-10 DIAGNOSIS — Z6839 Body mass index (BMI) 39.0-39.9, adult: Secondary | ICD-10-CM | POA: Diagnosis not present

## 2021-03-10 DIAGNOSIS — I69351 Hemiplegia and hemiparesis following cerebral infarction affecting right dominant side: Secondary | ICD-10-CM | POA: Diagnosis not present

## 2021-03-10 DIAGNOSIS — M19042 Primary osteoarthritis, left hand: Secondary | ICD-10-CM | POA: Diagnosis not present

## 2021-03-10 DIAGNOSIS — I83893 Varicose veins of bilateral lower extremities with other complications: Secondary | ICD-10-CM | POA: Diagnosis not present

## 2021-03-10 DIAGNOSIS — I10 Essential (primary) hypertension: Secondary | ICD-10-CM | POA: Diagnosis not present

## 2021-03-10 DIAGNOSIS — Z8719 Personal history of other diseases of the digestive system: Secondary | ICD-10-CM | POA: Diagnosis not present

## 2021-03-17 DIAGNOSIS — Z86718 Personal history of other venous thrombosis and embolism: Secondary | ICD-10-CM | POA: Diagnosis not present

## 2021-03-17 DIAGNOSIS — Z6839 Body mass index (BMI) 39.0-39.9, adult: Secondary | ICD-10-CM | POA: Diagnosis not present

## 2021-03-17 DIAGNOSIS — N179 Acute kidney failure, unspecified: Secondary | ICD-10-CM | POA: Diagnosis not present

## 2021-03-17 DIAGNOSIS — K219 Gastro-esophageal reflux disease without esophagitis: Secondary | ICD-10-CM | POA: Diagnosis not present

## 2021-03-17 DIAGNOSIS — Z8719 Personal history of other diseases of the digestive system: Secondary | ICD-10-CM | POA: Diagnosis not present

## 2021-03-17 DIAGNOSIS — I83893 Varicose veins of bilateral lower extremities with other complications: Secondary | ICD-10-CM | POA: Diagnosis not present

## 2021-03-17 DIAGNOSIS — G8928 Other chronic postprocedural pain: Secondary | ICD-10-CM | POA: Diagnosis not present

## 2021-03-17 DIAGNOSIS — Z8616 Personal history of COVID-19: Secondary | ICD-10-CM | POA: Diagnosis not present

## 2021-03-17 DIAGNOSIS — M21371 Foot drop, right foot: Secondary | ICD-10-CM | POA: Diagnosis not present

## 2021-03-17 DIAGNOSIS — D500 Iron deficiency anemia secondary to blood loss (chronic): Secondary | ICD-10-CM | POA: Diagnosis not present

## 2021-03-17 DIAGNOSIS — J9601 Acute respiratory failure with hypoxia: Secondary | ICD-10-CM | POA: Diagnosis not present

## 2021-03-17 DIAGNOSIS — I10 Essential (primary) hypertension: Secondary | ICD-10-CM | POA: Diagnosis not present

## 2021-03-17 DIAGNOSIS — R1310 Dysphagia, unspecified: Secondary | ICD-10-CM | POA: Diagnosis not present

## 2021-03-17 DIAGNOSIS — K5900 Constipation, unspecified: Secondary | ICD-10-CM | POA: Diagnosis not present

## 2021-03-17 DIAGNOSIS — E785 Hyperlipidemia, unspecified: Secondary | ICD-10-CM | POA: Diagnosis not present

## 2021-03-17 DIAGNOSIS — D849 Immunodeficiency, unspecified: Secondary | ICD-10-CM | POA: Diagnosis not present

## 2021-03-17 DIAGNOSIS — Z79891 Long term (current) use of opiate analgesic: Secondary | ICD-10-CM | POA: Diagnosis not present

## 2021-03-17 DIAGNOSIS — I69320 Aphasia following cerebral infarction: Secondary | ICD-10-CM | POA: Diagnosis not present

## 2021-03-17 DIAGNOSIS — E039 Hypothyroidism, unspecified: Secondary | ICD-10-CM | POA: Diagnosis not present

## 2021-03-17 DIAGNOSIS — I69351 Hemiplegia and hemiparesis following cerebral infarction affecting right dominant side: Secondary | ICD-10-CM | POA: Diagnosis not present

## 2021-03-17 DIAGNOSIS — M19042 Primary osteoarthritis, left hand: Secondary | ICD-10-CM | POA: Diagnosis not present

## 2021-03-17 DIAGNOSIS — M19041 Primary osteoarthritis, right hand: Secondary | ICD-10-CM | POA: Diagnosis not present

## 2021-03-17 DIAGNOSIS — I4891 Unspecified atrial fibrillation: Secondary | ICD-10-CM | POA: Diagnosis not present

## 2021-03-21 ENCOUNTER — Other Ambulatory Visit: Payer: Self-pay

## 2021-03-21 NOTE — Patient Outreach (Signed)
[facility]) Care Management  03/21/2021  Candice Perez 06-24-1950 [id number]   Telephone outreach to patient to obtain mRS was successfully completed. MRS= 1  Thank you, [place] Care Management Assistant

## 2021-03-24 DIAGNOSIS — Z8719 Personal history of other diseases of the digestive system: Secondary | ICD-10-CM | POA: Diagnosis not present

## 2021-03-24 DIAGNOSIS — D500 Iron deficiency anemia secondary to blood loss (chronic): Secondary | ICD-10-CM | POA: Diagnosis not present

## 2021-03-24 DIAGNOSIS — M21371 Foot drop, right foot: Secondary | ICD-10-CM | POA: Diagnosis not present

## 2021-03-24 DIAGNOSIS — I69320 Aphasia following cerebral infarction: Secondary | ICD-10-CM | POA: Diagnosis not present

## 2021-03-24 DIAGNOSIS — K219 Gastro-esophageal reflux disease without esophagitis: Secondary | ICD-10-CM | POA: Diagnosis not present

## 2021-03-24 DIAGNOSIS — G8928 Other chronic postprocedural pain: Secondary | ICD-10-CM | POA: Diagnosis not present

## 2021-03-24 DIAGNOSIS — D849 Immunodeficiency, unspecified: Secondary | ICD-10-CM | POA: Diagnosis not present

## 2021-03-24 DIAGNOSIS — J9601 Acute respiratory failure with hypoxia: Secondary | ICD-10-CM | POA: Diagnosis not present

## 2021-03-24 DIAGNOSIS — Z86718 Personal history of other venous thrombosis and embolism: Secondary | ICD-10-CM | POA: Diagnosis not present

## 2021-03-24 DIAGNOSIS — K5900 Constipation, unspecified: Secondary | ICD-10-CM | POA: Diagnosis not present

## 2021-03-24 DIAGNOSIS — Z8616 Personal history of COVID-19: Secondary | ICD-10-CM | POA: Diagnosis not present

## 2021-03-24 DIAGNOSIS — E039 Hypothyroidism, unspecified: Secondary | ICD-10-CM | POA: Diagnosis not present

## 2021-03-24 DIAGNOSIS — I10 Essential (primary) hypertension: Secondary | ICD-10-CM | POA: Diagnosis not present

## 2021-03-24 DIAGNOSIS — Z6839 Body mass index (BMI) 39.0-39.9, adult: Secondary | ICD-10-CM | POA: Diagnosis not present

## 2021-03-24 DIAGNOSIS — N179 Acute kidney failure, unspecified: Secondary | ICD-10-CM | POA: Diagnosis not present

## 2021-03-24 DIAGNOSIS — R1310 Dysphagia, unspecified: Secondary | ICD-10-CM | POA: Diagnosis not present

## 2021-03-24 DIAGNOSIS — I83893 Varicose veins of bilateral lower extremities with other complications: Secondary | ICD-10-CM | POA: Diagnosis not present

## 2021-03-24 DIAGNOSIS — M19041 Primary osteoarthritis, right hand: Secondary | ICD-10-CM | POA: Diagnosis not present

## 2021-03-24 DIAGNOSIS — M19042 Primary osteoarthritis, left hand: Secondary | ICD-10-CM | POA: Diagnosis not present

## 2021-03-24 DIAGNOSIS — I4891 Unspecified atrial fibrillation: Secondary | ICD-10-CM | POA: Diagnosis not present

## 2021-03-24 DIAGNOSIS — E785 Hyperlipidemia, unspecified: Secondary | ICD-10-CM | POA: Diagnosis not present

## 2021-03-24 DIAGNOSIS — Z79891 Long term (current) use of opiate analgesic: Secondary | ICD-10-CM | POA: Diagnosis not present

## 2021-03-24 DIAGNOSIS — I69351 Hemiplegia and hemiparesis following cerebral infarction affecting right dominant side: Secondary | ICD-10-CM | POA: Diagnosis not present

## 2021-03-25 DIAGNOSIS — M79604 Pain in right leg: Secondary | ICD-10-CM | POA: Diagnosis not present

## 2021-03-25 DIAGNOSIS — R509 Fever, unspecified: Secondary | ICD-10-CM | POA: Diagnosis not present

## 2021-03-29 ENCOUNTER — Ambulatory Visit: Payer: Medicare HMO | Admitting: Neurology

## 2021-03-29 ENCOUNTER — Encounter: Payer: Self-pay | Admitting: Neurology

## 2021-03-29 VITALS — BP 163/79 | HR 54 | Ht 67.0 in | Wt 262.0 lb

## 2021-03-29 DIAGNOSIS — M79604 Pain in right leg: Secondary | ICD-10-CM | POA: Diagnosis not present

## 2021-03-29 DIAGNOSIS — I63412 Cerebral infarction due to embolism of left middle cerebral artery: Secondary | ICD-10-CM | POA: Diagnosis not present

## 2021-03-29 DIAGNOSIS — R0683 Snoring: Secondary | ICD-10-CM

## 2021-03-29 MED ORDER — GABAPENTIN 300 MG PO CAPS: 300.0000 mg | ORAL_CAPSULE | Freq: Three times a day (TID) | ORAL | 11 refills | Status: DC

## 2021-03-29 NOTE — Progress Notes (Signed)
Chief Complaint  Patient presents with   New Patient (Initial Visit)    Rm 15, with son,hosp f/u on Middle cerebral artery embolism      ASSESSMENT AND PLAN  Candice Perez is a 70 y.o. female   Left MCA stroke in July 2022, with mild residual spastic hemiparesis, expressive aphasia  Status post thrombectomy, likely due to new onset atrial fibrillation, now on Eliquis 5 mg twice a day  Vascular risk factor of obesity, hypertension hyperlipidemia  At risk for obstructive sleep apnea  Frequent snoring, small oropharyngeal space,  Refer to sleep study Right lateral leg burning pain,  Possible shingles, is treated by primary care with Valtrex, prednisone tapering  Increase gabapentin up to 303 times a day     DIAGNOSTIC DATA (LABS, IMAGING, TESTING) - I reviewed patient records, labs, notes, testing and imaging myself where available. MRI of brain on December 03 2020: Small to moderate acute cortical infarct in the left frontal parietal cortex primarily in the postcentral gyrus. No associated hemorrhage.   MEDICAL HISTORY:  Candice Perez is a 70 year old female, seen in request by her primary care doctor Kennith Maes S follow-up on stroke, she is accompanied by her son at today's visit March 29, 2021.  I reviewed and summarized the referring note.PMHX HLD A fib-Eliquis 5mg  bid  She was admitted to hospital in July 2022 for new onset atrial fibrillation, while at hospital stay, 21 2022 she had a sudden onset aphasia, right upper and lower extremity weakness, I personally reviewed MRI of the brain, small to moderate acute cortical infarction in the left frontal parietal cortex, primarily in the postcentral gyri no associated hemorrhage.  She had thrombectomy by Dr. Estanislado Pandy, etiology likely due to newly diagnosed atrial fibrillation also has vascular risk factor of obesity, hyperlipidemia, cardiomyopathy, now on Eliquis 5 mg twice a day  With rehabilitation, she  regained significant recovery, lives at home with her husband, still has mild expressive aphasia, mild right arm and leg weakness, ambulate with a cane,  In addition, a week ago early November 2022, she developed right lateral leg burning pain, no rash broke out, was diagnosed of possible shingles primary care physician, was given prescription of Valtrex, and prednisone tapering, also gabapentin 400 mg every night does help her symptoms,  Echocardiogram February 28, 2021, mild global hypokinesia, ejection fraction 40 to 50%  Laboratory evaluations, vitamin D 60, normal CBC hemoglobin 12.4, BMP, creatinine 1.16, BUN 26, glucose 113, GFR of 50  PHYSICAL EXAM:   Vitals:   03/29/21 1508  BP: (!) 163/79  Pulse: (!) 54  Weight: 262 lb (118.8 kg)  Height: 5\' 7"  (1.702 m)   Not recorded     Body mass index is 41.04 kg/m.  PHYSICAL EXAMNIATION:  Gen: NAD, conversant, well nourised, well groomed                     Cardiovascular: Regular rate rhythm, no peripheral edema, warm, nontender. Eyes: Conjunctivae clear without exudates or hemorrhage Neck: Supple, no carotid bruits. Pulmonary: Clear to auscultation bilaterally   NEUROLOGICAL EXAM:  MENTAL STATUS: Speech: Mildly expressive aphasia, slow repetition, normal comprehension Cognition:     Orientation to time, place and person     Normal recent and remote memory     Normal Attention span and concentration     Normal Language, naming, repeating,spontaneous speech     Fund of knowledge   CRANIAL NERVES: CN II: Visual fields are full to  confrontation. Pupils are round equal and briskly reactive to light. CN III, IV, VI: extraocular movement are normal. No ptosis. CN V: Facial sensation is intact to light touch CN VII: Face is symmetric with normal eye closure  CN VIII: Hearing is normal to causal conversation. CN IX, X: Phonation is normal.  Narrow oropharyngeal, small chin, pushback tongue, CN XI: Head turning and shoulder  shrug are intact  MOTOR: Mild spastic right hemiparesis, upper extremity fixation on rapid rotating movement, mild right leg drift  REFLEXES: Reflexes are 2+ and symmetric at the biceps, triceps, knees, and ankles. Plantar responses are flexor.  SENSORY: Intact to light touch, pinprick and vibratory sensation are intact in fingers and toes.  COORDINATION: There is no trunk or limb dysmetria noted.  GAIT/STANCE: Need to push-up to get up from seated position, mildly unsteady right semicircumferential gait REVIEW OF SYSTEMS:  Full 14 system review of systems performed and notable only for as above All other review of systems were negative.   ALLERGIES: No Known Allergies  HOME MEDICATIONS: Current Outpatient Medications  Medication Sig Dispense Refill   acetaminophen (TYLENOL) 325 MG tablet Take 2 tablets (650 mg total) by mouth every 4 (four) hours as needed for mild pain (or temp > 37.5 C (99.5 F)). 360 tablet 0   amiodarone (PACERONE) 200 MG tablet Take 1 tablet (200 mg total) by mouth daily. 30 tablet 0   apixaban (ELIQUIS) 5 MG TABS tablet Take 1 tablet (5 mg total) by mouth 2 (two) times daily. 60 tablet 0   diclofenac Sodium (VOLTAREN) 1 % GEL Apply 2 g topically 4 (four) times daily. 2 g 0   digoxin (LANOXIN) 0.125 MG tablet Take 1 tablet (0.125 mg total) by mouth daily. 30 tablet 0   diltiazem (CARDIZEM CD) 240 MG 24 hr capsule Take 1 capsule (240 mg total) by mouth daily. 30 capsule 0   gabapentin (NEURONTIN) 400 MG capsule Take 1 capsule (400 mg total) by mouth at bedtime. 30 capsule 0   levothyroxine (SYNTHROID) 100 MCG tablet Take 1 tablet (100 mcg total) by mouth daily at 6 (six) AM. 30 tablet 0   methylPREDNISolone (MEDROL DOSEPAK) 4 MG TBPK tablet Take by mouth as directed.     metoprolol tartrate (LOPRESSOR) 50 MG tablet Take 1 tablet (50 mg total) by mouth 3 (three) times daily. 90 tablet 0   rosuvastatin (CRESTOR) 20 MG tablet Take 1 tablet (20 mg total) by mouth  daily. 30 tablet 0   traMADol (ULTRAM) 50 MG tablet Take 50 mg by mouth every 6 (six) hours as needed.     valACYclovir (VALTREX) 1000 MG tablet Take 1,000 mg by mouth 3 (three) times daily.     No current facility-administered medications for this visit.    PAST MEDICAL HISTORY: Past Medical History:  Diagnosis Date   Arthritis    DVT (deep venous thrombosis) (HCC)    Edema    GERD (gastroesophageal reflux disease)    OCCASIONAL - PRILOSEC IF NEEDED   Hyperlipidemia    Hypertension    no meds now   Hypothyroidism    Varicose veins     PAST SURGICAL HISTORY: Past Surgical History:  Procedure Laterality Date   CHOLECYSTECTOMY     Laparoscopic   CYSTOCELE REPAIR     HERNIA REPAIR     IR CT HEAD LTD  12/02/2020   IR PERCUTANEOUS ART THROMBECTOMY/INFUSION INTRACRANIAL INC DIAG ANGIO  12/02/2020   RADIOLOGY WITH ANESTHESIA N/A 12/02/2020   Procedure:  IR WITH ANESTHESIA;  Surgeon: Luanne Bras, MD;  Location: [place] [facility] [place];  Service: Radiology;  Laterality: N/A;   TONSILLECTOMY     TOTAL KNEE ARTHROPLASTY Left 08/26/2013   Procedure: LEFT TOTAL KNEE REVISION;  Surgeon: Mauri Pole, MD;  Location: [facility];  Service: Orthopedics;  Laterality: Left;   VARICOSE VEIN SURGERY      FAMILY HISTORY: Family History  Problem Relation Age of Onset   Congestive Heart Failure Mother    Cancer Mother        tongue/sinus   Cancer Father        "in his back"   Other Father        Varicose veins, Arteriosclerosis    SOCIAL HISTORY: Social History   Socioeconomic History   Marital status: Married    Spouse name: Not on file   Number of children: 2   Years of education: 9   Highest education level: Not on file  Occupational History   Occupation: Retired  Tobacco Use   Smoking status: Former    Years: 40.00    Types: Cigarettes    Quit date: 09/17/2010    Years since quitting: 10.5   Smokeless tobacco: Never  Vaping Use   Vaping Use: Never used  Substance and Sexual Activity    Alcohol use: No   Drug use: No   Sexual activity: Not on file  Other Topics Concern   Not on file  Social History Narrative   Lives at home with her husband.   Right-handed.   No daily caffeine use.   Social Determinants of Health   Financial Resource Strain: Not on file  Food Insecurity: Not on file  Transportation Needs: Not on file  Physical Activity: Not on file  Stress: Not on file  Social Connections: Not on file  Intimate Partner Violence: Not on file   Total time spent reviewing the chart, obtaining history, examined patient, ordering tests, documentation, consultations and family, care coordination was 41 mintues     Marcial Pacas, M.D. Ph.D.  [facility] [address], [place], [place] [address] Ph: [phone] Fax: [phone]  CC:  Ronita Hipps, MD [place] [address],   Ronita Hipps, MD

## 2021-03-31 DIAGNOSIS — R1310 Dysphagia, unspecified: Secondary | ICD-10-CM | POA: Diagnosis not present

## 2021-03-31 DIAGNOSIS — G8928 Other chronic postprocedural pain: Secondary | ICD-10-CM | POA: Diagnosis not present

## 2021-03-31 DIAGNOSIS — I10 Essential (primary) hypertension: Secondary | ICD-10-CM | POA: Diagnosis not present

## 2021-03-31 DIAGNOSIS — M21371 Foot drop, right foot: Secondary | ICD-10-CM | POA: Diagnosis not present

## 2021-03-31 DIAGNOSIS — Z6839 Body mass index (BMI) 39.0-39.9, adult: Secondary | ICD-10-CM | POA: Diagnosis not present

## 2021-03-31 DIAGNOSIS — Z8719 Personal history of other diseases of the digestive system: Secondary | ICD-10-CM | POA: Diagnosis not present

## 2021-03-31 DIAGNOSIS — J9601 Acute respiratory failure with hypoxia: Secondary | ICD-10-CM | POA: Diagnosis not present

## 2021-03-31 DIAGNOSIS — D500 Iron deficiency anemia secondary to blood loss (chronic): Secondary | ICD-10-CM | POA: Diagnosis not present

## 2021-03-31 DIAGNOSIS — I83893 Varicose veins of bilateral lower extremities with other complications: Secondary | ICD-10-CM | POA: Diagnosis not present

## 2021-03-31 DIAGNOSIS — Z8616 Personal history of COVID-19: Secondary | ICD-10-CM | POA: Diagnosis not present

## 2021-03-31 DIAGNOSIS — K5900 Constipation, unspecified: Secondary | ICD-10-CM | POA: Diagnosis not present

## 2021-03-31 DIAGNOSIS — E039 Hypothyroidism, unspecified: Secondary | ICD-10-CM | POA: Diagnosis not present

## 2021-03-31 DIAGNOSIS — I4891 Unspecified atrial fibrillation: Secondary | ICD-10-CM | POA: Diagnosis not present

## 2021-03-31 DIAGNOSIS — M19041 Primary osteoarthritis, right hand: Secondary | ICD-10-CM | POA: Diagnosis not present

## 2021-03-31 DIAGNOSIS — Z86718 Personal history of other venous thrombosis and embolism: Secondary | ICD-10-CM | POA: Diagnosis not present

## 2021-03-31 DIAGNOSIS — M19042 Primary osteoarthritis, left hand: Secondary | ICD-10-CM | POA: Diagnosis not present

## 2021-03-31 DIAGNOSIS — I69351 Hemiplegia and hemiparesis following cerebral infarction affecting right dominant side: Secondary | ICD-10-CM | POA: Diagnosis not present

## 2021-03-31 DIAGNOSIS — D849 Immunodeficiency, unspecified: Secondary | ICD-10-CM | POA: Diagnosis not present

## 2021-03-31 DIAGNOSIS — I69320 Aphasia following cerebral infarction: Secondary | ICD-10-CM | POA: Diagnosis not present

## 2021-03-31 DIAGNOSIS — E785 Hyperlipidemia, unspecified: Secondary | ICD-10-CM | POA: Diagnosis not present

## 2021-03-31 DIAGNOSIS — Z79891 Long term (current) use of opiate analgesic: Secondary | ICD-10-CM | POA: Diagnosis not present

## 2021-03-31 DIAGNOSIS — N179 Acute kidney failure, unspecified: Secondary | ICD-10-CM | POA: Diagnosis not present

## 2021-03-31 DIAGNOSIS — K219 Gastro-esophageal reflux disease without esophagitis: Secondary | ICD-10-CM | POA: Diagnosis not present

## 2021-04-05 DIAGNOSIS — I69320 Aphasia following cerebral infarction: Secondary | ICD-10-CM | POA: Diagnosis not present

## 2021-04-05 DIAGNOSIS — M19042 Primary osteoarthritis, left hand: Secondary | ICD-10-CM | POA: Diagnosis not present

## 2021-04-05 DIAGNOSIS — I83893 Varicose veins of bilateral lower extremities with other complications: Secondary | ICD-10-CM | POA: Diagnosis not present

## 2021-04-05 DIAGNOSIS — R1310 Dysphagia, unspecified: Secondary | ICD-10-CM | POA: Diagnosis not present

## 2021-04-05 DIAGNOSIS — J9601 Acute respiratory failure with hypoxia: Secondary | ICD-10-CM | POA: Diagnosis not present

## 2021-04-05 DIAGNOSIS — Z6839 Body mass index (BMI) 39.0-39.9, adult: Secondary | ICD-10-CM | POA: Diagnosis not present

## 2021-04-05 DIAGNOSIS — I10 Essential (primary) hypertension: Secondary | ICD-10-CM | POA: Diagnosis not present

## 2021-04-05 DIAGNOSIS — D849 Immunodeficiency, unspecified: Secondary | ICD-10-CM | POA: Diagnosis not present

## 2021-04-05 DIAGNOSIS — K5900 Constipation, unspecified: Secondary | ICD-10-CM | POA: Diagnosis not present

## 2021-04-05 DIAGNOSIS — E785 Hyperlipidemia, unspecified: Secondary | ICD-10-CM | POA: Diagnosis not present

## 2021-04-05 DIAGNOSIS — Z8616 Personal history of COVID-19: Secondary | ICD-10-CM | POA: Diagnosis not present

## 2021-04-05 DIAGNOSIS — I69351 Hemiplegia and hemiparesis following cerebral infarction affecting right dominant side: Secondary | ICD-10-CM | POA: Diagnosis not present

## 2021-04-05 DIAGNOSIS — K219 Gastro-esophageal reflux disease without esophagitis: Secondary | ICD-10-CM | POA: Diagnosis not present

## 2021-04-05 DIAGNOSIS — G8928 Other chronic postprocedural pain: Secondary | ICD-10-CM | POA: Diagnosis not present

## 2021-04-05 DIAGNOSIS — Z79891 Long term (current) use of opiate analgesic: Secondary | ICD-10-CM | POA: Diagnosis not present

## 2021-04-05 DIAGNOSIS — M19041 Primary osteoarthritis, right hand: Secondary | ICD-10-CM | POA: Diagnosis not present

## 2021-04-05 DIAGNOSIS — Z86718 Personal history of other venous thrombosis and embolism: Secondary | ICD-10-CM | POA: Diagnosis not present

## 2021-04-05 DIAGNOSIS — Z8719 Personal history of other diseases of the digestive system: Secondary | ICD-10-CM | POA: Diagnosis not present

## 2021-04-05 DIAGNOSIS — D500 Iron deficiency anemia secondary to blood loss (chronic): Secondary | ICD-10-CM | POA: Diagnosis not present

## 2021-04-05 DIAGNOSIS — N179 Acute kidney failure, unspecified: Secondary | ICD-10-CM | POA: Diagnosis not present

## 2021-04-05 DIAGNOSIS — I4891 Unspecified atrial fibrillation: Secondary | ICD-10-CM | POA: Diagnosis not present

## 2021-04-05 DIAGNOSIS — M21371 Foot drop, right foot: Secondary | ICD-10-CM | POA: Diagnosis not present

## 2021-04-05 DIAGNOSIS — E039 Hypothyroidism, unspecified: Secondary | ICD-10-CM | POA: Diagnosis not present

## 2021-04-12 DIAGNOSIS — Z79891 Long term (current) use of opiate analgesic: Secondary | ICD-10-CM | POA: Diagnosis not present

## 2021-04-12 DIAGNOSIS — Z6839 Body mass index (BMI) 39.0-39.9, adult: Secondary | ICD-10-CM | POA: Diagnosis not present

## 2021-04-12 DIAGNOSIS — M21371 Foot drop, right foot: Secondary | ICD-10-CM | POA: Diagnosis not present

## 2021-04-12 DIAGNOSIS — D849 Immunodeficiency, unspecified: Secondary | ICD-10-CM | POA: Diagnosis not present

## 2021-04-12 DIAGNOSIS — I69320 Aphasia following cerebral infarction: Secondary | ICD-10-CM | POA: Diagnosis not present

## 2021-04-12 DIAGNOSIS — K5900 Constipation, unspecified: Secondary | ICD-10-CM | POA: Diagnosis not present

## 2021-04-12 DIAGNOSIS — E039 Hypothyroidism, unspecified: Secondary | ICD-10-CM | POA: Diagnosis not present

## 2021-04-12 DIAGNOSIS — Z86718 Personal history of other venous thrombosis and embolism: Secondary | ICD-10-CM | POA: Diagnosis not present

## 2021-04-12 DIAGNOSIS — I10 Essential (primary) hypertension: Secondary | ICD-10-CM | POA: Diagnosis not present

## 2021-04-12 DIAGNOSIS — Z8719 Personal history of other diseases of the digestive system: Secondary | ICD-10-CM | POA: Diagnosis not present

## 2021-04-12 DIAGNOSIS — R1310 Dysphagia, unspecified: Secondary | ICD-10-CM | POA: Diagnosis not present

## 2021-04-12 DIAGNOSIS — M19042 Primary osteoarthritis, left hand: Secondary | ICD-10-CM | POA: Diagnosis not present

## 2021-04-12 DIAGNOSIS — I4891 Unspecified atrial fibrillation: Secondary | ICD-10-CM | POA: Diagnosis not present

## 2021-04-12 DIAGNOSIS — N179 Acute kidney failure, unspecified: Secondary | ICD-10-CM | POA: Diagnosis not present

## 2021-04-12 DIAGNOSIS — M19041 Primary osteoarthritis, right hand: Secondary | ICD-10-CM | POA: Diagnosis not present

## 2021-04-12 DIAGNOSIS — E785 Hyperlipidemia, unspecified: Secondary | ICD-10-CM | POA: Diagnosis not present

## 2021-04-12 DIAGNOSIS — I83893 Varicose veins of bilateral lower extremities with other complications: Secondary | ICD-10-CM | POA: Diagnosis not present

## 2021-04-12 DIAGNOSIS — Z8616 Personal history of COVID-19: Secondary | ICD-10-CM | POA: Diagnosis not present

## 2021-04-12 DIAGNOSIS — J9601 Acute respiratory failure with hypoxia: Secondary | ICD-10-CM | POA: Diagnosis not present

## 2021-04-12 DIAGNOSIS — I69351 Hemiplegia and hemiparesis following cerebral infarction affecting right dominant side: Secondary | ICD-10-CM | POA: Diagnosis not present

## 2021-04-12 DIAGNOSIS — D500 Iron deficiency anemia secondary to blood loss (chronic): Secondary | ICD-10-CM | POA: Diagnosis not present

## 2021-04-12 DIAGNOSIS — G8928 Other chronic postprocedural pain: Secondary | ICD-10-CM | POA: Diagnosis not present

## 2021-04-12 DIAGNOSIS — K219 Gastro-esophageal reflux disease without esophagitis: Secondary | ICD-10-CM | POA: Diagnosis not present

## 2021-04-18 ENCOUNTER — Other Ambulatory Visit: Payer: Self-pay

## 2021-04-18 ENCOUNTER — Encounter: Payer: Medicare HMO | Attending: Physical Medicine and Rehabilitation | Admitting: Physical Medicine and Rehabilitation

## 2021-04-18 VITALS — BP 101/66 | HR 67 | Temp 98.3°F | Ht 67.0 in | Wt 267.0 lb

## 2021-04-18 DIAGNOSIS — M79604 Pain in right leg: Secondary | ICD-10-CM | POA: Diagnosis not present

## 2021-04-18 DIAGNOSIS — R5382 Chronic fatigue, unspecified: Secondary | ICD-10-CM | POA: Diagnosis not present

## 2021-04-18 DIAGNOSIS — G609 Hereditary and idiopathic neuropathy, unspecified: Secondary | ICD-10-CM | POA: Diagnosis not present

## 2021-04-18 DIAGNOSIS — R7303 Prediabetes: Secondary | ICD-10-CM | POA: Diagnosis not present

## 2021-04-18 MED ORDER — NAC 600 MG PO CAPS: 1.0000 | ORAL_CAPSULE | Freq: Two times a day (BID) | ORAL | 0 refills | Status: DC

## 2021-04-18 NOTE — Progress Notes (Signed)
Subjective:    Patient ID: Candice Perez, female    DOB: 23-Nov-1950, 70 y.o.   MRN: [id number]  HPI Candice Perez is a 70 year old woman who presents for hospital follow-up after CIR admission for MCA CVA, as well as for fatigue and peripheral neuropathy  She has decreased sensation in both feet  Average pain is 6/10 and pain right now is 3/10  In hospital she was found to have prediabetes.   She tries to follow a healthy diet at home  Left foot is painful- she has better sensation in this foot  Ready to try Qutenza today- she had great benefit last visit with complete resolution of pain  She also finds Gabapentin helpful  She does not check her CBGs at hhome  She asks if she may restart her vitamins- used to use a lot at home  Notes worsening fatigue post-stroke   Pain Inventory Average Pain 6 Pain Right Now 3 My pain is aching  LOCATION OF PAIN  hand,ankles , toes  BOWEL Number of stools per week: 5 Oral laxative use No  Type of laxative n/a Enema or suppository use No  History of colostomy No  Incontinent No   BLADDER  In and out cath, frequency n/a Able to self cath No  Bladder incontinence Yes  Frequent urination No  Leakage with coughing No  Difficulty starting stream No  Incomplete bladder emptying No    Mobility use a cane use a wheelchair transfers alone  Function not employed: date last employed   I need assistance with the following:  shopping  Neuro/Psych bladder control problems weakness numbness trouble walking  Prior Studies N/a  Physicians involved in your care N/a   Family History  Problem Relation Age of Onset   Congestive Heart Failure Mother    Cancer Mother        tongue/sinus   Cancer Father        "in his back"   Other Father        Varicose veins, Arteriosclerosis   Social History   Socioeconomic History   Marital status: Married    Spouse name: Not on file   Number of children: 2   Years of  education: 9   Highest education level: Not on file  Occupational History   Occupation: Retired  Tobacco Use   Smoking status: Former    Years: 40.00    Types: Cigarettes    Quit date: 09/17/2010    Years since quitting: 10.5   Smokeless tobacco: Never  Vaping Use   Vaping Use: Never used  Substance and Sexual Activity   Alcohol use: No   Drug use: No   Sexual activity: Not on file  Other Topics Concern   Not on file  Social History Narrative   Lives at home with her husband.   Right-handed.   No daily caffeine use.   Social Determinants of Health   Financial Resource Strain: Not on file  Food Insecurity: Not on file  Transportation Needs: Not on file  Physical Activity: Not on file  Stress: Not on file  Social Connections: Not on file   Past Surgical History:  Procedure Laterality Date   CHOLECYSTECTOMY     Laparoscopic   CYSTOCELE REPAIR     HERNIA REPAIR     IR CT HEAD LTD  12/02/2020   IR PERCUTANEOUS ART THROMBECTOMY/INFUSION INTRACRANIAL INC DIAG ANGIO  12/02/2020   RADIOLOGY WITH ANESTHESIA N/A 12/02/2020   Procedure:  IR WITH ANESTHESIA;  Surgeon: Luanne Bras, MD;  Location: [place];  Service: Radiology;  Laterality: N/A;   TONSILLECTOMY     TOTAL KNEE ARTHROPLASTY Left 08/26/2013   Procedure: LEFT TOTAL KNEE REVISION;  Surgeon: Mauri Pole, MD;  Location: [facility];  Service: Orthopedics;  Laterality: Left;   VARICOSE VEIN SURGERY     Past Medical History:  Diagnosis Date   Arthritis    DVT (deep venous thrombosis) (HCC)    Edema    GERD (gastroesophageal reflux disease)    OCCASIONAL - PRILOSEC IF NEEDED   Hyperlipidemia    Hypertension    no meds now   Hypothyroidism    Varicose veins    BP 101/66   Pulse 67   Temp 98.3 F (36.8 C)   Ht 5\' 7"  (1.702 m)   Wt 267 lb (121.1 kg)   SpO2 91%   BMI 41.82 kg/m   Opioid Risk Score:   Fall Risk Score:  `1  Depression screen PHQ 2/9  Depression screen PHQ 2/9 01/12/2021  Decreased Interest 0   Down, Depressed, Hopeless 0  PHQ - 2 Score 0  Altered sleeping 0  Tired, decreased energy 1  Change in appetite 0  Feeling bad or failure about yourself  0  Trouble concentrating 3  Moving slowly or fidgety/restless 3  Suicidal thoughts 0  PHQ-9 Score 7  Difficult doing work/chores Somewhat difficult       Review of Systems  Constitutional: Negative.   HENT: Negative.    Eyes: Negative.   Respiratory: Negative.    Cardiovascular:  Positive for leg swelling.       Limb swelling  Gastrointestinal: Negative.   Endocrine: Negative.   Genitourinary: Negative.   Musculoskeletal:        Pain in ankles , toes,   Skin: Negative.   Allergic/Immunologic: Negative.   Neurological:  Positive for weakness and numbness.  Hematological: Negative.   Psychiatric/Behavioral: Negative.        Objective:   Physical Exam Gen: no distress, normal appearing, weight 278 lbs, BMI 43.54, BP 119/72 HEENT: oral mucosa pink and moist, NCAT Cardio: Reg rate Chest: normal effort, normal rate of breathing Abd: soft, non-distended Ext: no edema Psych: pleasant, normal affect Skin: no open sores on feet Neuro: Alert and oriented x3 Musculoskeletal: In Wheelchair. Decreased right sided strength     Assessment & Plan:  Candice Perez is a 70 year old woman who presents for hospital follow-up after CIR admission for CVA, as well as for prediabetes, peripheral neuropathy, and fatigue  1) Prediabetes -avoid sugar, bread, pasta, rice -avoid snacking -try to incorporate into your diet some of the following foods which are good for diabetes: 1) cinnamon- imitates effects of insulin, increasing glucose transport into cells ([place] or [place] cinnamon is best, least processed) 2) nuts- can slow down the blood sugar response of carbohydrate rich foods 3) oatmeal- contains and anti-inflammatory compound avenanthramide 4) whole-milk yogurt (best types are no sugar, [place] yogurt, or goat/sheep  yogurt) 5) beans- high in protein, fiber, and vitamins, low glycemic index 6) broccoli- great source of vitamin A and C 7) quinoa- higher in protein and fiber than other grains 8) spinach- high in vitamin A, fiber, and protein 9) olive oil- reduces glucose levels, LDL, and triglycerides 10) salmon- excellent amount of omega-3-fatty acids 11) walnuts- rich in antioxidants 12) apples- high in fiber and quercetin 13) carrots- highly nutritious with low impact on blood sugar 14) eggs- improve HDL (  good cholesterol), high in protein, keep you satiated 15) turmeric: improves blood sugars, cardiovascular disease, and protects kidney health 16) garlic: improves blood sugar, blood pressure, pain 17) tomatoes: highly nutritious with low impact on blood sugar  Recommended 1 glass water with 1 TB apple cider vinegar before meals to reduce CBG spike, has additional health benefits, drink with straw to protect enamel.    2) Impaired mobility following L MCA stroke -she requires larger wheelchair- current one she is renting is great- will provide script and fax to company -recommend daily walk outside, building distance daily  3) Idiopathic peripheral neuropathy -Discussed Qutenza as an option for neuropathic pain control. Discussed that this is a capsaicin patch, stronger than capsaicin cream. Discussed that it is currently approved for diabetic peripheral neuropathy and post-herpetic neuralgia, but that it has also shown benefit in treating other forms of neuropathy. Provided patient with link to site to learn more about the patch: [employer]. Discussed that the patch would be placed in office and benefits usually last 3 months. Discussed that unintended exposure to capsaicin can cause severe irritation of eyes, mucous membranes, respiratory tract, and skin, but that Qutenza is a local treatment and does not have the systemic side effects of other nerve medications. Discussed that there may be  pain, itching, erythema, and decreased sensory function associated with the application of Qutenza. Side effects usually subside within 1 week. A cold pack of analgesic medications can help with these side effects. Blood pressure can also be increased due to pain associated with administration of the patch.  4 patches of Qutenza was applied to the area of pain. Ice packs were applied during the procedure to ensure patient comfort. Blood pressure was monitored every 15 minutes. The patient tolerated the procedure well. Post-procedure instructions were given and follow-up has been scheduled.

## 2021-04-18 NOTE — Patient Instructions (Signed)
Nattokinase

## 2021-04-19 DIAGNOSIS — G8928 Other chronic postprocedural pain: Secondary | ICD-10-CM | POA: Diagnosis not present

## 2021-04-19 DIAGNOSIS — I69351 Hemiplegia and hemiparesis following cerebral infarction affecting right dominant side: Secondary | ICD-10-CM | POA: Diagnosis not present

## 2021-04-19 DIAGNOSIS — I83893 Varicose veins of bilateral lower extremities with other complications: Secondary | ICD-10-CM | POA: Diagnosis not present

## 2021-04-19 DIAGNOSIS — Z6839 Body mass index (BMI) 39.0-39.9, adult: Secondary | ICD-10-CM | POA: Diagnosis not present

## 2021-04-19 DIAGNOSIS — K219 Gastro-esophageal reflux disease without esophagitis: Secondary | ICD-10-CM | POA: Diagnosis not present

## 2021-04-19 DIAGNOSIS — M19041 Primary osteoarthritis, right hand: Secondary | ICD-10-CM | POA: Diagnosis not present

## 2021-04-19 DIAGNOSIS — D849 Immunodeficiency, unspecified: Secondary | ICD-10-CM | POA: Diagnosis not present

## 2021-04-19 DIAGNOSIS — I4891 Unspecified atrial fibrillation: Secondary | ICD-10-CM | POA: Diagnosis not present

## 2021-04-19 DIAGNOSIS — D500 Iron deficiency anemia secondary to blood loss (chronic): Secondary | ICD-10-CM | POA: Diagnosis not present

## 2021-04-19 DIAGNOSIS — K5900 Constipation, unspecified: Secondary | ICD-10-CM | POA: Diagnosis not present

## 2021-04-19 DIAGNOSIS — R1310 Dysphagia, unspecified: Secondary | ICD-10-CM | POA: Diagnosis not present

## 2021-04-19 DIAGNOSIS — I69320 Aphasia following cerebral infarction: Secondary | ICD-10-CM | POA: Diagnosis not present

## 2021-04-19 DIAGNOSIS — Z8719 Personal history of other diseases of the digestive system: Secondary | ICD-10-CM | POA: Diagnosis not present

## 2021-04-19 DIAGNOSIS — J9601 Acute respiratory failure with hypoxia: Secondary | ICD-10-CM | POA: Diagnosis not present

## 2021-04-19 DIAGNOSIS — E039 Hypothyroidism, unspecified: Secondary | ICD-10-CM | POA: Diagnosis not present

## 2021-04-19 DIAGNOSIS — Z79891 Long term (current) use of opiate analgesic: Secondary | ICD-10-CM | POA: Diagnosis not present

## 2021-04-19 DIAGNOSIS — M19042 Primary osteoarthritis, left hand: Secondary | ICD-10-CM | POA: Diagnosis not present

## 2021-04-19 DIAGNOSIS — M21371 Foot drop, right foot: Secondary | ICD-10-CM | POA: Diagnosis not present

## 2021-04-19 DIAGNOSIS — N179 Acute kidney failure, unspecified: Secondary | ICD-10-CM | POA: Diagnosis not present

## 2021-04-19 DIAGNOSIS — Z86718 Personal history of other venous thrombosis and embolism: Secondary | ICD-10-CM | POA: Diagnosis not present

## 2021-04-19 DIAGNOSIS — Z8616 Personal history of COVID-19: Secondary | ICD-10-CM | POA: Diagnosis not present

## 2021-04-19 DIAGNOSIS — I10 Essential (primary) hypertension: Secondary | ICD-10-CM | POA: Diagnosis not present

## 2021-04-19 DIAGNOSIS — E785 Hyperlipidemia, unspecified: Secondary | ICD-10-CM | POA: Diagnosis not present

## 2021-04-21 DIAGNOSIS — G8928 Other chronic postprocedural pain: Secondary | ICD-10-CM | POA: Diagnosis not present

## 2021-04-21 DIAGNOSIS — E039 Hypothyroidism, unspecified: Secondary | ICD-10-CM | POA: Diagnosis not present

## 2021-04-21 DIAGNOSIS — Z8719 Personal history of other diseases of the digestive system: Secondary | ICD-10-CM | POA: Diagnosis not present

## 2021-04-21 DIAGNOSIS — Z86718 Personal history of other venous thrombosis and embolism: Secondary | ICD-10-CM | POA: Diagnosis not present

## 2021-04-21 DIAGNOSIS — J9601 Acute respiratory failure with hypoxia: Secondary | ICD-10-CM | POA: Diagnosis not present

## 2021-04-21 DIAGNOSIS — K219 Gastro-esophageal reflux disease without esophagitis: Secondary | ICD-10-CM | POA: Diagnosis not present

## 2021-04-21 DIAGNOSIS — I69351 Hemiplegia and hemiparesis following cerebral infarction affecting right dominant side: Secondary | ICD-10-CM | POA: Diagnosis not present

## 2021-04-21 DIAGNOSIS — D849 Immunodeficiency, unspecified: Secondary | ICD-10-CM | POA: Diagnosis not present

## 2021-04-21 DIAGNOSIS — D500 Iron deficiency anemia secondary to blood loss (chronic): Secondary | ICD-10-CM | POA: Diagnosis not present

## 2021-04-21 DIAGNOSIS — E785 Hyperlipidemia, unspecified: Secondary | ICD-10-CM | POA: Diagnosis not present

## 2021-04-21 DIAGNOSIS — R1310 Dysphagia, unspecified: Secondary | ICD-10-CM | POA: Diagnosis not present

## 2021-04-21 DIAGNOSIS — I83893 Varicose veins of bilateral lower extremities with other complications: Secondary | ICD-10-CM | POA: Diagnosis not present

## 2021-04-21 DIAGNOSIS — I69320 Aphasia following cerebral infarction: Secondary | ICD-10-CM | POA: Diagnosis not present

## 2021-04-21 DIAGNOSIS — N179 Acute kidney failure, unspecified: Secondary | ICD-10-CM | POA: Diagnosis not present

## 2021-04-21 DIAGNOSIS — M19042 Primary osteoarthritis, left hand: Secondary | ICD-10-CM | POA: Diagnosis not present

## 2021-04-21 DIAGNOSIS — Z8616 Personal history of COVID-19: Secondary | ICD-10-CM | POA: Diagnosis not present

## 2021-04-21 DIAGNOSIS — M19041 Primary osteoarthritis, right hand: Secondary | ICD-10-CM | POA: Diagnosis not present

## 2021-04-21 DIAGNOSIS — K5900 Constipation, unspecified: Secondary | ICD-10-CM | POA: Diagnosis not present

## 2021-04-21 DIAGNOSIS — Z79891 Long term (current) use of opiate analgesic: Secondary | ICD-10-CM | POA: Diagnosis not present

## 2021-04-21 DIAGNOSIS — I4891 Unspecified atrial fibrillation: Secondary | ICD-10-CM | POA: Diagnosis not present

## 2021-04-21 DIAGNOSIS — Z6839 Body mass index (BMI) 39.0-39.9, adult: Secondary | ICD-10-CM | POA: Diagnosis not present

## 2021-04-21 DIAGNOSIS — M21371 Foot drop, right foot: Secondary | ICD-10-CM | POA: Diagnosis not present

## 2021-04-21 DIAGNOSIS — I10 Essential (primary) hypertension: Secondary | ICD-10-CM | POA: Diagnosis not present

## 2021-04-25 DIAGNOSIS — N179 Acute kidney failure, unspecified: Secondary | ICD-10-CM | POA: Diagnosis not present

## 2021-04-25 DIAGNOSIS — K5900 Constipation, unspecified: Secondary | ICD-10-CM | POA: Diagnosis not present

## 2021-04-25 DIAGNOSIS — M21371 Foot drop, right foot: Secondary | ICD-10-CM | POA: Diagnosis not present

## 2021-04-25 DIAGNOSIS — E785 Hyperlipidemia, unspecified: Secondary | ICD-10-CM | POA: Diagnosis not present

## 2021-04-25 DIAGNOSIS — M19041 Primary osteoarthritis, right hand: Secondary | ICD-10-CM | POA: Diagnosis not present

## 2021-04-25 DIAGNOSIS — J9601 Acute respiratory failure with hypoxia: Secondary | ICD-10-CM | POA: Diagnosis not present

## 2021-04-25 DIAGNOSIS — G8928 Other chronic postprocedural pain: Secondary | ICD-10-CM | POA: Diagnosis not present

## 2021-04-25 DIAGNOSIS — D849 Immunodeficiency, unspecified: Secondary | ICD-10-CM | POA: Diagnosis not present

## 2021-04-25 DIAGNOSIS — I4891 Unspecified atrial fibrillation: Secondary | ICD-10-CM | POA: Diagnosis not present

## 2021-04-25 DIAGNOSIS — I69351 Hemiplegia and hemiparesis following cerebral infarction affecting right dominant side: Secondary | ICD-10-CM | POA: Diagnosis not present

## 2021-04-25 DIAGNOSIS — R1310 Dysphagia, unspecified: Secondary | ICD-10-CM | POA: Diagnosis not present

## 2021-04-25 DIAGNOSIS — Z6839 Body mass index (BMI) 39.0-39.9, adult: Secondary | ICD-10-CM | POA: Diagnosis not present

## 2021-04-25 DIAGNOSIS — I10 Essential (primary) hypertension: Secondary | ICD-10-CM | POA: Diagnosis not present

## 2021-04-25 DIAGNOSIS — I83893 Varicose veins of bilateral lower extremities with other complications: Secondary | ICD-10-CM | POA: Diagnosis not present

## 2021-04-25 DIAGNOSIS — M19042 Primary osteoarthritis, left hand: Secondary | ICD-10-CM | POA: Diagnosis not present

## 2021-04-25 DIAGNOSIS — Z8616 Personal history of COVID-19: Secondary | ICD-10-CM | POA: Diagnosis not present

## 2021-04-25 DIAGNOSIS — I69320 Aphasia following cerebral infarction: Secondary | ICD-10-CM | POA: Diagnosis not present

## 2021-04-25 DIAGNOSIS — Z86718 Personal history of other venous thrombosis and embolism: Secondary | ICD-10-CM | POA: Diagnosis not present

## 2021-04-25 DIAGNOSIS — D500 Iron deficiency anemia secondary to blood loss (chronic): Secondary | ICD-10-CM | POA: Diagnosis not present

## 2021-04-25 DIAGNOSIS — Z8719 Personal history of other diseases of the digestive system: Secondary | ICD-10-CM | POA: Diagnosis not present

## 2021-04-25 DIAGNOSIS — K219 Gastro-esophageal reflux disease without esophagitis: Secondary | ICD-10-CM | POA: Diagnosis not present

## 2021-04-25 DIAGNOSIS — E039 Hypothyroidism, unspecified: Secondary | ICD-10-CM | POA: Diagnosis not present

## 2021-04-25 DIAGNOSIS — Z79891 Long term (current) use of opiate analgesic: Secondary | ICD-10-CM | POA: Diagnosis not present

## 2021-05-05 ENCOUNTER — Other Ambulatory Visit: Payer: Self-pay

## 2021-05-05 ENCOUNTER — Ambulatory Visit (INDEPENDENT_AMBULATORY_CARE_PROVIDER_SITE_OTHER): Payer: Medicare HMO

## 2021-05-05 DIAGNOSIS — M79604 Pain in right leg: Secondary | ICD-10-CM

## 2021-05-12 ENCOUNTER — Encounter: Payer: Medicare HMO | Admitting: Physical Medicine and Rehabilitation

## 2021-05-12 ENCOUNTER — Other Ambulatory Visit: Payer: Self-pay

## 2021-05-12 DIAGNOSIS — E039 Hypothyroidism, unspecified: Secondary | ICD-10-CM | POA: Diagnosis not present

## 2021-05-12 DIAGNOSIS — M79604 Pain in right leg: Secondary | ICD-10-CM

## 2021-05-12 DIAGNOSIS — G629 Polyneuropathy, unspecified: Secondary | ICD-10-CM | POA: Diagnosis not present

## 2021-05-12 DIAGNOSIS — Z79899 Other long term (current) drug therapy: Secondary | ICD-10-CM | POA: Diagnosis not present

## 2021-05-12 DIAGNOSIS — Z0000 Encounter for general adult medical examination without abnormal findings: Secondary | ICD-10-CM | POA: Diagnosis not present

## 2021-05-12 DIAGNOSIS — Z6841 Body Mass Index (BMI) 40.0 and over, adult: Secondary | ICD-10-CM | POA: Diagnosis not present

## 2021-05-12 DIAGNOSIS — Z1331 Encounter for screening for depression: Secondary | ICD-10-CM | POA: Diagnosis not present

## 2021-05-12 DIAGNOSIS — Z23 Encounter for immunization: Secondary | ICD-10-CM | POA: Diagnosis not present

## 2021-05-12 DIAGNOSIS — I4891 Unspecified atrial fibrillation: Secondary | ICD-10-CM | POA: Diagnosis not present

## 2021-05-12 NOTE — Progress Notes (Signed)
Subjective:    Patient ID: Candice Perez, female    DOB: 1950-10-07, 70 y.o.   MRN: [id number]  HPI An audio/video tele-health visit is felt to be the most appropriate encounter for this patient at this time. This is a follow up tele-visit via phone. The patient is at home. MD is at office.    Candice Perez is a 70 year old woman who presents for hospital follow-up after CIR admission for MCA CVA, as well as for fatigue and peripheral neuropathy  She has decreased sensation in both feet  Average pain is 6/10 and pain right now is 3/10  In hospital she was found to have prediabetes.   She tries to follow a healthy diet at home  Left foot is painful- she has better sensation in this foot  Ready to try Qutenza today- she had great benefit last visit with complete resolution of pain  She also finds Gabapentin helpful  She does not check her CBGs at hhome  She asks if she may restart her vitamins- used to use a lot at home  Notes worsening fatigue post-stroke  Discussed the vascular ultrasound results and clot    Pain Inventory Average Pain 6 Pain Right Now 3 My pain is aching  LOCATION OF PAIN  hand,ankles , toes  BOWEL Number of stools per week: 5 Oral laxative use No  Type of laxative n/a Enema or suppository use No  History of colostomy No  Incontinent No   BLADDER  In and out cath, frequency n/a Able to self cath No  Bladder incontinence Yes  Frequent urination No  Leakage with coughing No  Difficulty starting stream No  Incomplete bladder emptying No    Mobility use a cane use a wheelchair transfers alone  Function not employed: date last employed   I need assistance with the following:  shopping  Neuro/Psych bladder control problems weakness numbness trouble walking  Prior Studies N/a  Physicians involved in your care N/a   Family History  Problem Relation Age of Onset   Congestive Heart Failure Mother    Cancer Mother         tongue/sinus   Cancer Father        "in his back"   Other Father        Varicose veins, Arteriosclerosis   Social History   Socioeconomic History   Marital status: Married    Spouse name: Not on file   Number of children: 2   Years of education: 9   Highest education level: Not on file  Occupational History   Occupation: Retired  Tobacco Use   Smoking status: Former    Years: 40.00    Types: Cigarettes    Quit date: 09/17/2010    Years since quitting: 10.6   Smokeless tobacco: Never  Vaping Use   Vaping Use: Never used  Substance and Sexual Activity   Alcohol use: No   Drug use: No   Sexual activity: Not on file  Other Topics Concern   Not on file  Social History Narrative   Lives at home with her husband.   Right-handed.   No daily caffeine use.   Social Determinants of Health   Financial Resource Strain: Not on file  Food Insecurity: Not on file  Transportation Needs: Not on file  Physical Activity: Not on file  Stress: Not on file  Social Connections: Not on file   Past Surgical History:  Procedure Laterality Date   CHOLECYSTECTOMY  Laparoscopic   CYSTOCELE REPAIR     HERNIA REPAIR     IR CT HEAD LTD  12/02/2020   IR PERCUTANEOUS ART THROMBECTOMY/INFUSION INTRACRANIAL INC DIAG ANGIO  12/02/2020   RADIOLOGY WITH ANESTHESIA N/A 12/02/2020   Procedure: IR WITH ANESTHESIA;  Surgeon: Luanne Bras, MD;  Location: [facility];  Service: Radiology;  Laterality: N/A;   TONSILLECTOMY     TOTAL KNEE ARTHROPLASTY Left 08/26/2013   Procedure: LEFT TOTAL KNEE REVISION;  Surgeon: Mauri Pole, MD;  Location: [facility];  Service: Orthopedics;  Laterality: Left;   VARICOSE VEIN SURGERY     Past Medical History:  Diagnosis Date   Arthritis    DVT (deep venous thrombosis) (HCC)    Edema    GERD (gastroesophageal reflux disease)    OCCASIONAL - PRILOSEC IF NEEDED   Hyperlipidemia    Hypertension    no meds now   Hypothyroidism    Varicose veins    There were no  vitals taken for this visit.  Opioid Risk Score:   Fall Risk Score:  `1  Depression screen PHQ 2/9  Depression screen PHQ 2/9 01/12/2021  Decreased Interest 0  Down, Depressed, Hopeless 0  PHQ - 2 Score 0  Altered sleeping 0  Tired, decreased energy 1  Change in appetite 0  Feeling bad or failure about yourself  0  Trouble concentrating 3  Moving slowly or fidgety/restless 3  Suicidal thoughts 0  PHQ-9 Score 7  Difficult doing work/chores Somewhat difficult       Review of Systems  Constitutional: Negative.   HENT: Negative.    Eyes: Negative.   Respiratory: Negative.    Cardiovascular:  Positive for leg swelling.       Limb swelling  Gastrointestinal: Negative.   Endocrine: Negative.   Genitourinary: Negative.   Musculoskeletal:        Pain in ankles , toes,   Skin: Negative.   Allergic/Immunologic: Negative.   Neurological:  Positive for weakness and numbness.  Hematological: Negative.   Psychiatric/Behavioral: Negative.        Objective:   Physical Exam Gen: no distress, normal appearing, weight 278 lbs, BMI 43.54, BP 119/72 HEENT: oral mucosa pink and moist, NCAT Cardio: Reg rate Chest: normal effort, normal rate of breathing Abd: soft, non-distended Ext: no edema Psych: pleasant, normal affect Skin: no open sores on feet Neuro: Alert and oriented x3 Musculoskeletal: In Wheelchair. Decreased right sided strength     Assessment & Plan:  Candice Perez is a 70 year old woman who presents for hospital follow-up after CIR admission for CVA, as well as for prediabetes, peripheral neuropathy, and fatigue  1) Prediabetes -avoid sugar, bread, pasta, rice -avoid snacking -try to incorporate into your diet some of the following foods which are good for diabetes: 1) cinnamon- imitates effects of insulin, increasing glucose transport into cells ([place] or [place] cinnamon is best, least processed) 2) nuts- can slow down the blood sugar response of carbohydrate  rich foods 3) oatmeal- contains and anti-inflammatory compound avenanthramide 4) whole-milk yogurt (best types are no sugar, [place] yogurt, or goat/sheep yogurt) 5) beans- high in protein, fiber, and vitamins, low glycemic index 6) broccoli- great source of vitamin A and C 7) quinoa- higher in protein and fiber than other grains 8) spinach- high in vitamin A, fiber, and protein 9) olive oil- reduces glucose levels, LDL, and triglycerides 10) salmon- excellent amount of omega-3-fatty acids 11) walnuts- rich in antioxidants 12) apples- high in fiber and quercetin  13) carrots- highly nutritious with low impact on blood sugar 14) eggs- improve HDL (good cholesterol), high in protein, keep you satiated 15) turmeric: improves blood sugars, cardiovascular disease, and protects kidney health 16) garlic: improves blood sugar, blood pressure, pain 17) tomatoes: highly nutritious with low impact on blood sugar  Recommended 1 glass water with 1 TB apple cider vinegar before meals to reduce CBG spike, has additional health benefits, drink with straw to protect enamel.    2) Impaired mobility following L MCA stroke -she requires larger wheelchair- current one she is renting is great- will provide script and fax to company -recommend daily walk outside, building distance daily  3) Idiopathic peripheral neuropathy -Discussed Qutenza as an option for neuropathic pain control. Discussed that this is a capsaicin patch, stronger than capsaicin cream. Discussed that it is currently approved for diabetic peripheral neuropathy and post-herpetic neuralgia, but that it has also shown benefit in treating other forms of neuropathy. Provided patient with link to site to learn more about the patch: [employer]. Discussed that the patch would be placed in office and benefits usually last 3 months. Discussed that unintended exposure to capsaicin can cause severe irritation of eyes, mucous membranes, respiratory  tract, and skin, but that Qutenza is a local treatment and does not have the systemic side effects of other nerve medications. Discussed that there may be pain, itching, erythema, and decreased sensory function associated with the application of Qutenza. Side effects usually subside within 1 week. A cold pack of analgesic medications can help with these side effects. Blood pressure can also be increased due to pain associated with administration of the patch.  4 patches of Qutenza was applied to the area of pain. Ice packs were applied during the procedure to ensure patient comfort. Blood pressure was monitored every 15 minutes. The patient tolerated the procedure well. Post-procedure instructions were given and follow-up has been scheduled.    4) Lower extremity pain -discussed vascular ultrasound results- show right lower extremity chronic DVT- discussed no change in management with this finding- her pain is in her left more than right leg

## 2021-05-13 DIAGNOSIS — E039 Hypothyroidism, unspecified: Secondary | ICD-10-CM | POA: Diagnosis not present

## 2021-05-13 DIAGNOSIS — I4891 Unspecified atrial fibrillation: Secondary | ICD-10-CM | POA: Diagnosis not present

## 2021-05-13 DIAGNOSIS — I10 Essential (primary) hypertension: Secondary | ICD-10-CM | POA: Diagnosis not present

## 2021-05-17 DIAGNOSIS — I4891 Unspecified atrial fibrillation: Secondary | ICD-10-CM | POA: Diagnosis not present

## 2021-05-17 DIAGNOSIS — E039 Hypothyroidism, unspecified: Secondary | ICD-10-CM | POA: Diagnosis not present

## 2021-05-17 DIAGNOSIS — D6869 Other thrombophilia: Secondary | ICD-10-CM | POA: Diagnosis not present

## 2021-05-17 DIAGNOSIS — Z7901 Long term (current) use of anticoagulants: Secondary | ICD-10-CM | POA: Diagnosis not present

## 2021-05-17 DIAGNOSIS — I10 Essential (primary) hypertension: Secondary | ICD-10-CM | POA: Diagnosis not present

## 2021-05-17 DIAGNOSIS — Z6841 Body Mass Index (BMI) 40.0 and over, adult: Secondary | ICD-10-CM | POA: Diagnosis not present

## 2021-05-17 DIAGNOSIS — Z008 Encounter for other general examination: Secondary | ICD-10-CM | POA: Diagnosis not present

## 2021-05-17 DIAGNOSIS — Z809 Family history of malignant neoplasm, unspecified: Secondary | ICD-10-CM | POA: Diagnosis not present

## 2021-05-17 DIAGNOSIS — I69351 Hemiplegia and hemiparesis following cerebral infarction affecting right dominant side: Secondary | ICD-10-CM | POA: Diagnosis not present

## 2021-05-17 DIAGNOSIS — E785 Hyperlipidemia, unspecified: Secondary | ICD-10-CM | POA: Diagnosis not present

## 2021-05-17 DIAGNOSIS — M1990 Unspecified osteoarthritis, unspecified site: Secondary | ICD-10-CM | POA: Diagnosis not present

## 2021-05-17 DIAGNOSIS — G629 Polyneuropathy, unspecified: Secondary | ICD-10-CM | POA: Diagnosis not present

## 2021-06-13 ENCOUNTER — Institutional Professional Consult (permissible substitution): Payer: Medicare HMO | Admitting: Neurology

## 2021-06-14 DIAGNOSIS — I4891 Unspecified atrial fibrillation: Secondary | ICD-10-CM | POA: Diagnosis not present

## 2021-06-14 DIAGNOSIS — I10 Essential (primary) hypertension: Secondary | ICD-10-CM | POA: Diagnosis not present

## 2021-06-14 DIAGNOSIS — E039 Hypothyroidism, unspecified: Secondary | ICD-10-CM | POA: Diagnosis not present

## 2021-06-15 DIAGNOSIS — I129 Hypertensive chronic kidney disease with stage 1 through stage 4 chronic kidney disease, or unspecified chronic kidney disease: Secondary | ICD-10-CM | POA: Diagnosis not present

## 2021-06-15 DIAGNOSIS — N1832 Chronic kidney disease, stage 3b: Secondary | ICD-10-CM | POA: Diagnosis not present

## 2021-06-15 DIAGNOSIS — N183 Chronic kidney disease, stage 3 unspecified: Secondary | ICD-10-CM | POA: Diagnosis not present

## 2021-06-15 DIAGNOSIS — R7303 Prediabetes: Secondary | ICD-10-CM | POA: Diagnosis not present

## 2021-06-15 DIAGNOSIS — N390 Urinary tract infection, site not specified: Secondary | ICD-10-CM | POA: Diagnosis not present

## 2021-06-15 DIAGNOSIS — I4891 Unspecified atrial fibrillation: Secondary | ICD-10-CM | POA: Diagnosis not present

## 2021-06-15 DIAGNOSIS — G629 Polyneuropathy, unspecified: Secondary | ICD-10-CM | POA: Diagnosis not present

## 2021-06-16 ENCOUNTER — Other Ambulatory Visit: Payer: Self-pay | Admitting: Internal Medicine

## 2021-06-16 DIAGNOSIS — N183 Chronic kidney disease, stage 3 unspecified: Secondary | ICD-10-CM

## 2021-06-21 DIAGNOSIS — N189 Chronic kidney disease, unspecified: Secondary | ICD-10-CM | POA: Diagnosis not present

## 2021-06-21 DIAGNOSIS — N183 Chronic kidney disease, stage 3 unspecified: Secondary | ICD-10-CM | POA: Diagnosis not present

## 2021-06-21 DIAGNOSIS — N281 Cyst of kidney, acquired: Secondary | ICD-10-CM | POA: Diagnosis not present

## 2021-06-21 DIAGNOSIS — N200 Calculus of kidney: Secondary | ICD-10-CM | POA: Diagnosis not present

## 2021-07-12 DIAGNOSIS — I10 Essential (primary) hypertension: Secondary | ICD-10-CM | POA: Diagnosis not present

## 2021-07-12 DIAGNOSIS — I4821 Permanent atrial fibrillation: Secondary | ICD-10-CM | POA: Diagnosis not present

## 2021-07-12 DIAGNOSIS — I5022 Chronic systolic (congestive) heart failure: Secondary | ICD-10-CM | POA: Diagnosis not present

## 2021-07-20 ENCOUNTER — Encounter: Payer: Self-pay | Admitting: Physical Medicine and Rehabilitation

## 2021-07-22 ENCOUNTER — Other Ambulatory Visit: Payer: Self-pay | Admitting: Physical Medicine and Rehabilitation

## 2021-07-22 MED ORDER — FUROSEMIDE 20 MG PO TABS: 20.0000 mg | ORAL_TABLET | Freq: Every day | ORAL | 3 refills | Status: DC | PRN

## 2021-08-06 DIAGNOSIS — I4891 Unspecified atrial fibrillation: Secondary | ICD-10-CM | POA: Diagnosis not present

## 2021-08-06 DIAGNOSIS — M1990 Unspecified osteoarthritis, unspecified site: Secondary | ICD-10-CM | POA: Diagnosis not present

## 2021-08-06 DIAGNOSIS — I5021 Acute systolic (congestive) heart failure: Secondary | ICD-10-CM | POA: Diagnosis not present

## 2021-08-06 DIAGNOSIS — I509 Heart failure, unspecified: Secondary | ICD-10-CM | POA: Diagnosis not present

## 2021-08-06 DIAGNOSIS — R5383 Other fatigue: Secondary | ICD-10-CM | POA: Diagnosis not present

## 2021-08-06 DIAGNOSIS — R69 Illness, unspecified: Secondary | ICD-10-CM | POA: Diagnosis not present

## 2021-08-06 DIAGNOSIS — E876 Hypokalemia: Secondary | ICD-10-CM | POA: Diagnosis not present

## 2021-08-06 DIAGNOSIS — N390 Urinary tract infection, site not specified: Secondary | ICD-10-CM | POA: Diagnosis not present

## 2021-08-06 DIAGNOSIS — I10 Essential (primary) hypertension: Secondary | ICD-10-CM | POA: Diagnosis not present

## 2021-08-06 DIAGNOSIS — R0902 Hypoxemia: Secondary | ICD-10-CM | POA: Diagnosis not present

## 2021-08-06 DIAGNOSIS — I110 Hypertensive heart disease with heart failure: Secondary | ICD-10-CM | POA: Diagnosis not present

## 2021-08-06 DIAGNOSIS — R0602 Shortness of breath: Secondary | ICD-10-CM | POA: Diagnosis not present

## 2021-08-06 DIAGNOSIS — E039 Hypothyroidism, unspecified: Secondary | ICD-10-CM | POA: Diagnosis not present

## 2021-08-07 DIAGNOSIS — I10 Essential (primary) hypertension: Secondary | ICD-10-CM | POA: Diagnosis not present

## 2021-08-07 DIAGNOSIS — I509 Heart failure, unspecified: Secondary | ICD-10-CM | POA: Diagnosis not present

## 2021-08-08 DIAGNOSIS — I509 Heart failure, unspecified: Secondary | ICD-10-CM | POA: Diagnosis not present

## 2021-08-08 DIAGNOSIS — I10 Essential (primary) hypertension: Secondary | ICD-10-CM | POA: Diagnosis not present

## 2021-08-09 ENCOUNTER — Other Ambulatory Visit: Payer: Self-pay

## 2021-08-09 ENCOUNTER — Encounter: Payer: Medicare HMO | Attending: Physical Medicine and Rehabilitation | Admitting: Physical Medicine and Rehabilitation

## 2021-08-09 DIAGNOSIS — G609 Hereditary and idiopathic neuropathy, unspecified: Secondary | ICD-10-CM | POA: Diagnosis not present

## 2021-08-09 MED ORDER — GABAPENTIN 400 MG PO CAPS: 400.0000 mg | ORAL_CAPSULE | Freq: Three times a day (TID) | ORAL | 3 refills | Status: DC

## 2021-08-11 NOTE — Progress Notes (Signed)
? ?Subjective:  ? ? Patient ID: Candice Perez, female    DOB: 27-Apr-1951, 71 y.o.   MRN: [id number] ? ?HPI ?An audio/video tele-health visit is felt to be the most appropriate encounter for this patient at this time. This is a follow up tele-visit via phone. The patient is at home. MD is at office. Prior to scheduling this appointment, our staff discussed the limitations of evaluation and management by telemedicine and the availability of in-person appointments. The patient expressed understanding and agreed to proceed.  ? ?Candice Perez is a 71 year old woman who presents for hospital follow-up after CIR admission for MCA CVA, as well as for fatigue and peripheral neuropathy ? ?She has decreased sensation in both feet ? ?Average pain is 6/10 and pain right now is 3/10. It had responded very well to Qutenza and Gabapentin but has now started again. ? ?In hospital she was found to have prediabetes.  ? ?She tries to follow a healthy diet at home ? ?Left foot is painful- she has better sensation in this foot ? ?Ready to try Qutenza today- she had great benefit last visit with complete resolution of pain ? ?She also finds Gabapentin helpful ? ?She does not check her CBGs at hhome ? ?She asks if she may restart her vitamins- used to use a lot at home ? ?Notes worsening fatigue post-stroke ? ?Discussed the vascular ultrasound results and clot  ? ? ?Pain Inventory ?Average Pain 6 ?Pain Right Now 3 ?My pain is aching ? ?LOCATION OF PAIN  hand,ankles , toes ? ?BOWEL ?Number of stools per week: 5 ?Oral laxative use No  ?Type of laxative n/a ?Enema or suppository use No  ?History of colostomy No  ?Incontinent No  ? ?BLADDER ? ?In and out cath, frequency n/a ?Able to self cath No  ?Bladder incontinence Yes  ?Frequent urination No  ?Leakage with coughing No  ?Difficulty starting stream No  ?Incomplete bladder emptying No  ? ? ?Mobility ?use a cane ?use a wheelchair ?transfers alone ? ?Function ?not employed: date last employed    ?I need assistance with the following:  shopping ? ?Neuro/Psych ?bladder control problems ?weakness ?numbness ?trouble walking ? ?Prior Studies ?N/a ? ?Physicians involved in your care ?N/a ? ? ?Family History  ?Problem Relation Age of Onset  ? Congestive Heart Failure Mother   ? Cancer Mother   ?     tongue/sinus  ? Cancer Father   ?     "in his back"  ? Other Father   ?     Varicose veins, Arteriosclerosis  ? ?Social History  ? ?Socioeconomic History  ? Marital status: Married  ?  Spouse name: Not on file  ? Number of children: 2  ? Years of education: 69  ? Highest education level: Not on file  ?Occupational History  ? Occupation: Retired  ?Tobacco Use  ? Smoking status: Former  ?  Years: 40.00  ?  Types: Cigarettes  ?  Quit date: 09/17/2010  ?  Years since quitting: 10.9  ? Smokeless tobacco: Never  ?Vaping Use  ? Vaping Use: Never used  ?Substance and Sexual Activity  ? Alcohol use: No  ? Drug use: No  ? Sexual activity: Not on file  ?Other Topics Concern  ? Not on file  ?Social History Narrative  ? Lives at home with her husband.  ? Right-handed.  ? No daily caffeine use.  ? ?Social Determinants of Health  ? ?Financial Resource Strain: Not  on file  ?Food Insecurity: Not on file  ?Transportation Needs: Not on file  ?Physical Activity: Not on file  ?Stress: Not on file  ?Social Connections: Not on file  ? ?Past Surgical History:  ?Procedure Laterality Date  ? CHOLECYSTECTOMY    ? Laparoscopic  ? CYSTOCELE REPAIR    ? HERNIA REPAIR    ? IR CT HEAD LTD  12/02/2020  ? IR PERCUTANEOUS ART THROMBECTOMY/INFUSION INTRACRANIAL INC DIAG ANGIO  12/02/2020  ? RADIOLOGY WITH ANESTHESIA N/A 12/02/2020  ? Procedure: IR WITH ANESTHESIA;  Surgeon: Luanne Bras, MD;  Location: [facility];  Service: Radiology;  Laterality: N/A;  ? TONSILLECTOMY    ? TOTAL KNEE ARTHROPLASTY Left 08/26/2013  ? Procedure: LEFT TOTAL KNEE REVISION;  Surgeon: Mauri Pole, MD;  Location: [facility];  Service: Orthopedics;  Laterality: Left;  ? VARICOSE VEIN  SURGERY    ? ?Past Medical History:  ?Diagnosis Date  ? Arthritis   ? DVT (deep venous thrombosis) ([facility])   ? Edema   ? GERD (gastroesophageal reflux disease)   ? OCCASIONAL - PRILOSEC IF NEEDED  ? Hyperlipidemia   ? Hypertension   ? no meds now  ? Hypothyroidism   ? Varicose veins   ? ?There were no vitals taken for this visit. ? ?Opioid Risk Score:   ?Fall Risk Score:  `1 ? ?Depression screen PHQ 2/9 ? ? ?  01/12/2021  ? 10:20 AM  ?Depression screen PHQ 2/9  ?Decreased Interest 0  ?Down, Depressed, Hopeless 0  ?PHQ - 2 Score 0  ?Altered sleeping 0  ?Tired, decreased energy 1  ?Change in appetite 0  ?Feeling bad or failure about yourself  0  ?Trouble concentrating 3  ?Moving slowly or fidgety/restless 3  ?Suicidal thoughts 0  ?PHQ-9 Score 7  ?Difficult doing work/chores Somewhat difficult  ?  ? ? ? ?Review of Systems  ?Constitutional: Negative.   ?HENT: Negative.    ?Eyes: Negative.   ?Respiratory: Negative.    ?Cardiovascular:  Positive for leg swelling.  ?     Limb swelling  ?Gastrointestinal: Negative.   ?Endocrine: Negative.   ?Genitourinary: Negative.   ?Musculoskeletal:   ?     Pain in ankles , toes,   ?Skin: Negative.   ?Allergic/Immunologic: Negative.   ?Neurological:  Positive for weakness and numbness.  ?Hematological: Negative.   ?Psychiatric/Behavioral: Negative.    ? ?   ?Objective:  ? Physical Exam ?Not performed as patient was seen via phone ? ?   ?Assessment & Plan:  ?Candice Perez is a 71 year old woman who presents for hospital follow-up after CIR admission for CVA, as well as for prediabetes, peripheral neuropathy, and fatigue ? ?1) Prediabetes ?-avoid sugar, bread, pasta, rice ?-avoid snacking ?-try to incorporate into your diet some of the following foods which are good for diabetes: ?1) cinnamon- imitates effects of insulin, increasing glucose transport into cells ([place] or [place] cinnamon is best, least processed) ?2) nuts- can slow down the blood sugar response of carbohydrate rich  foods ?3) oatmeal- contains and anti-inflammatory compound avenanthramide ?4) whole-milk yogurt (best types are no sugar, [place] yogurt, or goat/sheep yogurt) ?5) beans- high in protein, fiber, and vitamins, low glycemic index ?6) broccoli- great source of vitamin A and C ?7) quinoa- higher in protein and fiber than other grains ?8) spinach- high in vitamin A, fiber, and protein ?9) olive oil- reduces glucose levels, LDL, and triglycerides ?10) salmon- excellent amount of omega-3-fatty acids ?11) walnuts- rich in antioxidants ?12)  apples- high in fiber and quercetin ?13) carrots- highly nutritious with low impact on blood sugar ?14) eggs- improve HDL (good cholesterol), high in protein, keep you satiated ?15) turmeric: improves blood sugars, cardiovascular disease, and protects kidney health ?16) garlic: improves blood sugar, blood pressure, pain ?17) tomatoes: highly nutritious with low impact on blood sugar  ?Recommended 1 glass water with 1 TB apple cider vinegar before meals to reduce CBG spike, has additional health benefits, drink with straw to protect enamel.   ? ?2) Impaired mobility following L MCA stroke ?-she requires larger wheelchair- current one she is renting is great- will provide script and fax to company ?-recommend daily walk outside, building distance daily ? ?3) Idiopathic peripheral neuropathy ?-Discussed Qutenza as an option for neuropathic pain control. Discussed that this is a capsaicin patch, stronger than capsaicin cream. Discussed that it is currently approved for diabetic peripheral neuropathy and post-herpetic neuralgia, but that it has also shown benefit in treating other forms of neuropathy. Provided patient with link to site to learn more about the patch: [employer]. Discussed that the patch would be placed in office and benefits usually last 3 months. Discussed that unintended exposure to capsaicin can cause severe irritation of eyes, mucous membranes, respiratory  tract, and skin, but that Qutenza is a local treatment and does not have the systemic side effects of other nerve medications. Discussed that there may be pain, itching, erythema, and decreased sensory function

## 2021-08-12 DIAGNOSIS — I509 Heart failure, unspecified: Secondary | ICD-10-CM | POA: Diagnosis not present

## 2021-08-12 DIAGNOSIS — Z6841 Body Mass Index (BMI) 40.0 and over, adult: Secondary | ICD-10-CM | POA: Diagnosis not present

## 2021-08-16 ENCOUNTER — Encounter (HOSPITAL_COMMUNITY): Payer: Self-pay

## 2021-08-16 ENCOUNTER — Observation Stay (HOSPITAL_COMMUNITY)
Admission: EM | Admit: 2021-08-16 | Discharge: 2021-08-19 | Disposition: A | Discharge status: Home / Self Care | Payer: Medicare HMO | Attending: Internal Medicine | Admitting: Internal Medicine

## 2021-08-16 ENCOUNTER — Emergency Department (HOSPITAL_COMMUNITY): Payer: Medicare HMO

## 2021-08-16 ENCOUNTER — Other Ambulatory Visit: Payer: Self-pay

## 2021-08-16 DIAGNOSIS — Z8673 Personal history of transient ischemic attack (TIA), and cerebral infarction without residual deficits: Secondary | ICD-10-CM | POA: Insufficient documentation

## 2021-08-16 DIAGNOSIS — I480 Paroxysmal atrial fibrillation: Secondary | ICD-10-CM | POA: Insufficient documentation

## 2021-08-16 DIAGNOSIS — R001 Bradycardia, unspecified: Secondary | ICD-10-CM | POA: Diagnosis not present

## 2021-08-16 DIAGNOSIS — N179 Acute kidney failure, unspecified: Secondary | ICD-10-CM | POA: Diagnosis not present

## 2021-08-16 DIAGNOSIS — Z7901 Long term (current) use of anticoagulants: Secondary | ICD-10-CM | POA: Insufficient documentation

## 2021-08-16 DIAGNOSIS — I5042 Chronic combined systolic (congestive) and diastolic (congestive) heart failure: Secondary | ICD-10-CM | POA: Insufficient documentation

## 2021-08-16 DIAGNOSIS — Z96652 Presence of left artificial knee joint: Secondary | ICD-10-CM | POA: Insufficient documentation

## 2021-08-16 DIAGNOSIS — R7303 Prediabetes: Secondary | ICD-10-CM | POA: Diagnosis not present

## 2021-08-16 DIAGNOSIS — R0602 Shortness of breath: Secondary | ICD-10-CM | POA: Diagnosis present

## 2021-08-16 DIAGNOSIS — Z86718 Personal history of other venous thrombosis and embolism: Secondary | ICD-10-CM | POA: Insufficient documentation

## 2021-08-16 DIAGNOSIS — E039 Hypothyroidism, unspecified: Secondary | ICD-10-CM | POA: Insufficient documentation

## 2021-08-16 DIAGNOSIS — Z79899 Other long term (current) drug therapy: Secondary | ICD-10-CM | POA: Diagnosis not present

## 2021-08-16 DIAGNOSIS — I130 Hypertensive heart and chronic kidney disease with heart failure and stage 1 through stage 4 chronic kidney disease, or unspecified chronic kidney disease: Secondary | ICD-10-CM | POA: Diagnosis not present

## 2021-08-16 DIAGNOSIS — N1831 Chronic kidney disease, stage 3a: Secondary | ICD-10-CM | POA: Insufficient documentation

## 2021-08-16 DIAGNOSIS — R0600 Dyspnea, unspecified: Secondary | ICD-10-CM

## 2021-08-16 DIAGNOSIS — R0789 Other chest pain: Secondary | ICD-10-CM | POA: Diagnosis not present

## 2021-08-16 DIAGNOSIS — I10 Essential (primary) hypertension: Secondary | ICD-10-CM | POA: Diagnosis present

## 2021-08-16 DIAGNOSIS — I5022 Chronic systolic (congestive) heart failure: Secondary | ICD-10-CM

## 2021-08-16 DIAGNOSIS — Z87891 Personal history of nicotine dependence: Secondary | ICD-10-CM | POA: Diagnosis not present

## 2021-08-16 DIAGNOSIS — E785 Hyperlipidemia, unspecified: Secondary | ICD-10-CM

## 2021-08-16 HISTORY — DX: Unspecified atrial fibrillation: I48.91

## 2021-08-16 LAB — CBC WITH DIFFERENTIAL/PLATELET
Abs Immature Granulocytes: 0.07 10*3/uL (ref 0.00–0.07)
Basophils Absolute: 0 10*3/uL (ref 0.0–0.1)
Basophils Relative: 0 %
Eosinophils Absolute: 0.1 10*3/uL (ref 0.0–0.5)
Eosinophils Relative: 1 %
HCT: 37.8 % (ref 36.0–46.0)
Hemoglobin: 11.4 g/dL — ABNORMAL LOW (ref 12.0–15.0)
Immature Granulocytes: 1 %
Lymphocytes Relative: 31 %
Lymphs Abs: 2.7 10*3/uL (ref 0.7–4.0)
MCH: 28.5 pg (ref 26.0–34.0)
MCHC: 30.2 g/dL (ref 30.0–36.0)
MCV: 94.5 fL (ref 80.0–100.0)
Monocytes Absolute: 0.6 10*3/uL (ref 0.1–1.0)
Monocytes Relative: 7 %
Neutro Abs: 5.3 10*3/uL (ref 1.7–7.7)
Neutrophils Relative %: 60 %
Platelets: 304 10*3/uL (ref 150–400)
RBC: 4 MIL/uL (ref 3.87–5.11)
RDW: 14.7 % (ref 11.5–15.5)
WBC: 8.8 10*3/uL (ref 4.0–10.5)
nRBC: 0 % (ref 0.0–0.2)

## 2021-08-16 LAB — I-STAT CHEM 8, ED
BUN: 37 mg/dL — ABNORMAL HIGH (ref 8–23)
Calcium, Ion: 1.21 mmol/L (ref 1.15–1.40)
Chloride: 103 mmol/L (ref 98–111)
Creatinine, Ser: 2.4 mg/dL — ABNORMAL HIGH (ref 0.44–1.00)
Glucose, Bld: 124 mg/dL — ABNORMAL HIGH (ref 70–99)
HCT: 36 % (ref 36.0–46.0)
Hemoglobin: 12.2 g/dL (ref 12.0–15.0)
Potassium: 4.4 mmol/L (ref 3.5–5.1)
Sodium: 142 mmol/L (ref 135–145)
TCO2: 29 mmol/L (ref 22–32)

## 2021-08-16 LAB — DIGOXIN LEVEL: Digoxin Level: 0.3 ng/mL — ABNORMAL LOW (ref 0.8–2.0)

## 2021-08-16 LAB — COMPREHENSIVE METABOLIC PANEL
ALT: 34 U/L (ref 0–44)
AST: 27 U/L (ref 15–41)
Albumin: 3.5 g/dL (ref 3.5–5.0)
Alkaline Phosphatase: 61 U/L (ref 38–126)
Anion gap: 9 (ref 5–15)
BUN: 35 mg/dL — ABNORMAL HIGH (ref 8–23)
CO2: 26 mmol/L (ref 22–32)
Calcium: 9.3 mg/dL (ref 8.9–10.3)
Chloride: 106 mmol/L (ref 98–111)
Creatinine, Ser: 2.36 mg/dL — ABNORMAL HIGH (ref 0.44–1.00)
GFR, Estimated: 21 mL/min — ABNORMAL LOW (ref >60)
Glucose, Bld: 125 mg/dL — ABNORMAL HIGH (ref 70–99)
Potassium: 4.5 mmol/L (ref 3.5–5.1)
Sodium: 141 mmol/L (ref 135–145)
Total Bilirubin: 0.4 mg/dL (ref 0.3–1.2)
Total Protein: 6.6 g/dL (ref 6.5–8.1)

## 2021-08-16 LAB — BRAIN NATRIURETIC PEPTIDE: B Natriuretic Peptide: 285.3 pg/mL — ABNORMAL HIGH (ref 0.0–100.0)

## 2021-08-16 LAB — TROPONIN I (HIGH SENSITIVITY): Troponin I (High Sensitivity): 11 ng/L (ref <18)

## 2021-08-16 NOTE — ED Triage Notes (Signed)
Pt reports pain to under right side of breast associated with SOB and leg swelling. She was at [facility] last week for fluids on her lungs and has been taking lasix. ?

## 2021-08-16 NOTE — ED Provider Triage Note (Signed)
Emergency Medicine Provider Triage Evaluation Note ? ?Candice Perez , a 71 y.o. female  was evaluated in triage.  Pt complains of chest pain and shortness of breath.  Patient states that she was seen in the [facility] emergency department about 10 days ago due to pedal edema, shortness of breath, as well as difficulty with ambulation.  She was given IV Lasix and discharged after being diuresed.  She states that about 6 days ago her symptoms began once again.  States that they are not as severe as they were 10 days ago.  Reports right-sided chest pain beneath her breast.  Also notes increased work of breathing. ? ?Physical Exam  ?BP (!) 129/58 (BP Location: Left Arm)   Pulse (!) 38   Temp 97.7 ?F (36.5 ?C) (Oral)   Resp 18   SpO2 92%  ?Gen:   Awake, no distress   ?Resp:  Normal effort  ?MSK:   Moves extremities without difficulty  ?Other:   ? ?Medical Decision Making  ?Medically screening exam initiated at 10:35 PM.  Appropriate orders placed.  ROBBIE NANGLE was informed that the remainder of the evaluation will be completed by another provider, this initial triage assessment does not replace that evaluation, and the importance of remaining in the ED until their evaluation is complete. ?  ?Rayna Sexton, PA-C ?08/16/21 2236 ? ?

## 2021-08-17 ENCOUNTER — Encounter (HOSPITAL_COMMUNITY): Payer: Self-pay | Admitting: Emergency Medicine

## 2021-08-17 ENCOUNTER — Observation Stay (HOSPITAL_COMMUNITY): Payer: Medicare HMO

## 2021-08-17 ENCOUNTER — Observation Stay (HOSPITAL_BASED_OUTPATIENT_CLINIC_OR_DEPARTMENT_OTHER): Payer: Medicare HMO

## 2021-08-17 DIAGNOSIS — N179 Acute kidney failure, unspecified: Secondary | ICD-10-CM | POA: Diagnosis not present

## 2021-08-17 DIAGNOSIS — M7989 Other specified soft tissue disorders: Secondary | ICD-10-CM | POA: Diagnosis not present

## 2021-08-17 DIAGNOSIS — R609 Edema, unspecified: Secondary | ICD-10-CM | POA: Diagnosis not present

## 2021-08-17 DIAGNOSIS — E039 Hypothyroidism, unspecified: Secondary | ICD-10-CM

## 2021-08-17 DIAGNOSIS — R001 Bradycardia, unspecified: Secondary | ICD-10-CM

## 2021-08-17 DIAGNOSIS — Z0389 Encounter for observation for other suspected diseases and conditions ruled out: Secondary | ICD-10-CM | POA: Diagnosis not present

## 2021-08-17 DIAGNOSIS — R0600 Dyspnea, unspecified: Secondary | ICD-10-CM

## 2021-08-17 DIAGNOSIS — E785 Hyperlipidemia, unspecified: Secondary | ICD-10-CM

## 2021-08-17 DIAGNOSIS — I480 Paroxysmal atrial fibrillation: Secondary | ICD-10-CM

## 2021-08-17 DIAGNOSIS — Z8673 Personal history of transient ischemic attack (TIA), and cerebral infarction without residual deficits: Secondary | ICD-10-CM

## 2021-08-17 DIAGNOSIS — I5022 Chronic systolic (congestive) heart failure: Secondary | ICD-10-CM

## 2021-08-17 LAB — TROPONIN I (HIGH SENSITIVITY): Troponin I (High Sensitivity): 11 ng/L (ref <18)

## 2021-08-17 LAB — BASIC METABOLIC PANEL
Anion gap: 10 (ref 5–15)
BUN: 33 mg/dL — ABNORMAL HIGH (ref 8–23)
CO2: 25 mmol/L (ref 22–32)
Calcium: 9.2 mg/dL (ref 8.9–10.3)
Chloride: 107 mmol/L (ref 98–111)
Creatinine, Ser: 1.87 mg/dL — ABNORMAL HIGH (ref 0.44–1.00)
GFR, Estimated: 28 mL/min — ABNORMAL LOW (ref >60)
Glucose, Bld: 106 mg/dL — ABNORMAL HIGH (ref 70–99)
Potassium: 3.9 mmol/L (ref 3.5–5.1)
Sodium: 142 mmol/L (ref 135–145)

## 2021-08-17 LAB — URINALYSIS, ROUTINE W REFLEX MICROSCOPIC
Bacteria, UA: NONE SEEN
Bilirubin Urine: NEGATIVE
Glucose, UA: NEGATIVE mg/dL
Ketones, ur: NEGATIVE mg/dL
Nitrite: NEGATIVE
Protein, ur: NEGATIVE mg/dL
Specific Gravity, Urine: 1.011 (ref 1.005–1.030)
pH: 6 (ref 5.0–8.0)

## 2021-08-17 LAB — TSH: TSH: 4.189 u[IU]/mL (ref 0.350–4.500)

## 2021-08-17 LAB — MAGNESIUM: Magnesium: 2.2 mg/dL (ref 1.7–2.4)

## 2021-08-17 MED ORDER — LEVOTHYROXINE SODIUM 100 MCG PO TABS
100.0000 ug | ORAL_TABLET | Freq: Every day | ORAL | Status: DC
  Administered 2021-08-17 – 2021-08-19 (×3): 100 ug via ORAL
  Filled 2021-08-17 (×3): qty 1

## 2021-08-17 MED ORDER — GABAPENTIN 400 MG PO CAPS
400.0000 mg | ORAL_CAPSULE | Freq: Three times a day (TID) | ORAL | Status: DC
Start: 2021-08-17 — End: 2021-08-19
  Administered 2021-08-17 – 2021-08-19 (×7): 400 mg via ORAL
  Filled 2021-08-17 (×7): qty 1

## 2021-08-17 MED ORDER — APIXABAN 5 MG PO TABS
5.0000 mg | ORAL_TABLET | Freq: Two times a day (BID) | ORAL | Status: DC
  Administered 2021-08-17 – 2021-08-19 (×5): 5 mg via ORAL
  Filled 2021-08-17 (×5): qty 1

## 2021-08-17 MED ORDER — TECHNETIUM TO 99M ALBUMIN AGGREGATED
4.2000 | Freq: Once | INTRAVENOUS | Status: AC | PRN
  Administered 2021-08-17: 4.2 via INTRAVENOUS

## 2021-08-17 MED ORDER — ROSUVASTATIN CALCIUM 20 MG PO TABS
20.0000 mg | ORAL_TABLET | Freq: Every day | ORAL | Status: DC
  Administered 2021-08-17 – 2021-08-19 (×3): 20 mg via ORAL
  Filled 2021-08-17 (×3): qty 1

## 2021-08-17 MED ORDER — ACETAMINOPHEN 650 MG RE SUPP: 650.0000 mg | Freq: Four times a day (QID) | RECTAL | Status: DC | PRN

## 2021-08-17 MED ORDER — ACETAMINOPHEN 325 MG PO TABS: 650.0000 mg | ORAL_TABLET | Freq: Four times a day (QID) | ORAL | Status: DC | PRN

## 2021-08-17 MED ORDER — AMIODARONE HCL 200 MG PO TABS
200.0000 mg | ORAL_TABLET | Freq: Every day | ORAL | Status: DC
  Administered 2021-08-17 – 2021-08-19 (×3): 200 mg via ORAL
  Filled 2021-08-17 (×3): qty 1

## 2021-08-17 NOTE — Evaluation (Signed)
Physical Therapy Evaluation ?Patient Details ?Name: Candice Perez ?MRN: [id number] ?DOB: 21-Oct-1950 ?Today's Date: 08/17/2021 ? ?History of Present Illness ? 71 yo female presents to [facility] on 4/4 with pain under R side of breast, LE swelling, bradycardia with EKG showing 1st degree AV block. REcent d/c from [facility] for UTI, HF. PMH includes A-fib on Eliquis, history of DVT, left MCA stroke 11/2020 with residual spastic hemiparesis and expressive aphasia, chronic systolic CHF, prediabetes, hypertension, hyperlipidemia, hypothyroidism, GERD, peripheral neuropathy.  ?Clinical Impression ?  ?Pt presents with impaired activity tolerance vs baseline, bilat foot pain which pt attributes to swelling, and impaired higher level balance. Pt to benefit from acute PT to address deficits. Pt ambulated good hallway distance without AD, PT to continue to follow to progress tolerance and higher level balance. PT anticipates no follow up PT needs at this time. PT to progress mobility as tolerated. ?   ?   ? ?Recommendations for follow up therapy are one component of a multi-disciplinary discharge planning process, led by the attending physician.  Recommendations may be updated based on patient status, additional functional criteria and insurance authorization. ? ?Follow Up Recommendations No PT follow up ? ?  ?Assistance Recommended at Discharge Set up Supervision/Assistance  ?Patient can return home with the following ? A little help with bathing/dressing/bathroom ? ?  ?Equipment Recommendations None recommended by PT  ?Recommendations for Other Services ?    ?  ?Functional Status Assessment Patient has had a recent decline in their functional status and demonstrates the ability to make significant improvements in function in a reasonable and predictable amount of time.  ? ?  ?Precautions / Restrictions Precautions ?Precautions: Fall ?Restrictions ?Weight Bearing Restrictions: No  ? ?  ? ?Mobility ? Bed Mobility ?Overal bed  mobility: Modified Independent ?Bed Mobility: Supine to Sit, Sit to Supine ?  ?  ?Supine to sit: Modified independent (Device/Increase time) ?Sit to supine: Modified independent (Device/Increase time) ?  ?General bed mobility comments: mod I for increased time and use of bed rails. ?  ? ?Transfers ?Overall transfer level: Modified independent ?Equipment used: None ?  ?  ?  ?  ?  ?  ?  ?General transfer comment: increased time to rise, no physical assist ?  ? ?Ambulation/Gait ?Ambulation/Gait assistance: Supervision ?Gait Distance (Feet): 100 Feet ?Assistive device: None ?Gait Pattern/deviations: Step-through pattern, Decreased stride length ?Gait velocity: decr ?  ?  ?General Gait Details: for safety only, RLE min spastic gait which is chronic ? ?Stairs ?  ?  ?  ?  ?  ? ?Wheelchair Mobility ?  ? ?Modified Rankin (Stroke Patients Only) ?  ? ?  ? ?Balance Overall balance assessment: Mild deficits observed, not formally tested ?  ?  ?  ?  ?  ?  ?  ?  ?  ?  ?  ?  ?  ?  ?  ?  ?  ?  ?   ? ? ? ?Pertinent Vitals/Pain Pain Assessment ?Pain Assessment: 0-10 ?Pain Score: 4  ?Pain Location: bilat feet L>R ?Pain Descriptors / Indicators: Sore, Discomfort ?Pain Intervention(s): Limited activity within patient's tolerance, Monitored during session, Repositioned  ? ? ?Home Living Family/patient expects to be discharged to:: Private residence ?Living Arrangements: Spouse/significant other ?Available Help at Discharge: Family;Available 24 hours/day;Friend(s) ?Type of Home: House ?Home Access: Level entry ?  ?  ?  ?Home Layout: One level ?Home Equipment: BSC/3in1;Shower seat;Cane - quad;Rollator (4 wheels);Wheelchair - manual ?   ?  ?Prior  Function Prior Level of Function : Independent/Modified Independent ?  ?  ?  ?  ?  ?  ?Mobility Comments: pt reports intermittently using quad cane or rollator. Pt reports since hospitalization ~10 days ago she has been having bilat foot pain ?  ?  ? ? ?Hand Dominance  ? Dominant Hand: Right ? ?   ?Extremity/Trunk Assessment  ? Upper Extremity Assessment ?Upper Extremity Assessment: Defer to OT evaluation ?  ? ?Lower Extremity Assessment ?Lower Extremity Assessment: Generalized weakness (history of remote CVA, R residual coordination and hypertonicity impairment) ?  ? ?Cervical / Trunk Assessment ?Cervical / Trunk Assessment: Normal  ?Communication  ? Communication: Expressive difficulties  ?Cognition Arousal/Alertness: Awake/alert ?Behavior During Therapy: [facility] for tasks assessed/performed ?Overall Cognitive Status: Within Functional Limits for tasks assessed ?  ?  ?  ?  ?  ?  ?  ?  ?  ?  ?  ?  ?  ?  ?  ?  ?  ?  ?  ? ?  ?General Comments General comments (skin integrity, edema, etc.): HR 43-57 bpm during session, no reports of dizziness, pallor, tacypnea. SpO2 and BP WFL on RA ? ?  ?Exercises    ? ?Assessment/Plan  ?  ?PT Assessment Patient needs continued PT services  ?PT Problem List Decreased mobility;Decreased activity tolerance;Decreased balance;Decreased knowledge of use of DME;Pain;Cardiopulmonary status limiting activity;Decreased safety awareness ? ?   ?  ?PT Treatment Interventions DME instruction;Therapeutic activities;Gait training;Therapeutic exercise;Patient/family education;Balance training;Stair training;Neuromuscular re-education;Functional mobility training   ? ?PT Goals (Current goals can be found in the Care Plan section)  ?Acute Rehab PT Goals ?PT Goal Formulation: With patient ?Time For Goal Achievement: 08/31/21 ?Potential to Achieve Goals: Good ? ?  ?Frequency Min 3X/week ?  ? ? ?Co-evaluation   ?  ?  ?  ?  ? ? ?  ?AM-PAC PT "6 Clicks" Mobility  ?Outcome Measure Help needed turning from your back to your side while in a flat bed without using bedrails?: A Little ?Help needed moving from lying on your back to sitting on the side of a flat bed without using bedrails?: A Little ?Help needed moving to and from a bed to a chair (including a wheelchair)?: A Little ?Help needed standing up  from a chair using your arms (e.g., wheelchair or bedside chair)?: A Little ?Help needed to walk in hospital room?: A Little ?Help needed climbing 3-5 steps with a railing? : A Little ?6 Click Score: 18 ? ?  ?End of Session   ?Activity Tolerance: Patient tolerated treatment well ?Patient left: in bed;with call bell/phone within reach;with family/visitor present ?Nurse Communication: Mobility status ?PT Visit Diagnosis: Other abnormalities of gait and mobility (R26.89);Muscle weakness (generalized) (M62.81) ?  ? ?Time: 9169-4503 ?PT Time Calculation (min) (ACUTE ONLY): 21 min ? ? ?Charges:   PT Evaluation ?$PT Eval Low Complexity: 1 Low ?  ?  ?   ?Stacie Glaze, PT DPT ?Acute Rehabilitation Services ?Pager [phone]  ?Office [phone] ? ? ?Lizza Huffaker E Stroup ?08/17/2021, 4:20 PM ? ?

## 2021-08-17 NOTE — Progress Notes (Signed)
Lower extremity venous has been completed.  ? ?Preliminary results in CV Proc.  ? ?Candice Perez ?08/17/2021 9:16 AM    ?

## 2021-08-17 NOTE — Progress Notes (Signed)
Patient seen and examined, admitted earlier this morning by Dr. Marlowe Sax briefly Mr. Zito is a 70/F with history of chronic systolic and diastolic CHF, paroxysmal atrial fibrillation, history of CVA with dysarthria and mild hemiparesis, prediabetes, obesity, hypertension, dyslipidemia, GERD presented to the ED with shortness of breath and chronic left leg swelling, she also reported some pleuritic right lower chest pain.  She was recently discharged from [facility] 10 days ago after treatment for CHF exacerbation, digoxin was discontinued then. ?-Reports compliance with diuretics and anticoagulation ?-In the ED she was noted to be bradycardic with first-degree AV block, labs noted worsening creatinine up to 2.3 from baseline of 1.4, dig level was low, chest x-ray was clear ? ?Bradycardia ?-Could be contributing to some of her symptoms ?-Continue amiodarone, metoprolol and Cardizem held ?-Digoxin was discontinued following recent hospitalization at [facility] ?-Monitor on telemetry ?-Follow-up TSH and repeat echo ?-PT OT eval's ? ?Pleuritic chest pain, dyspnea ?-No evidence of ACS, troponins are negative, patient reports compliance with Eliquis ?-Dopplers and VQ scan ordered by admitting MD will follow up, clinical suspicion for VTE is low ?-Discussed need for sleep study ? ?AKI on CKD 2 ?-Baseline creatinine around 1.0, recently discharged from [facility] after treatment for CHF and diuresis ?-Creatinine trended up to 2.4, improving now down to 1.8, hold diuretics today ? ?Chronic systolic and diastolic CHF ?-Echo in 1/61 noted EF of 40-45% ?-Clinically appears euvolemic, BNP 285, chest x-ray is clear ?-Diuretics held today on account of worsening AKI, monitor volume status closely ? ?Hypothyroidism ?-Continue Synthroid, follow-up TSH ? ?Paroxysmal A-fib ?-Resume amiodarone, hold metoprolol, Cardizem ?-Continue Eliquis ? ?History of CVA ?-Continue Eliquis and Crestor ? ?DVT prophylaxis: Eliquis ? ?CODE  STATUS: DNR ? ?Domenic Polite, MD ? ?

## 2021-08-17 NOTE — Assessment & Plan Note (Signed)
-  Continue Synthroid and check TSH level. °

## 2021-08-17 NOTE — Assessment & Plan Note (Signed)
Bradycardia likely contributing. Chest x-ray showing no active disease.  ACS less likely as high-sensitivity troponin negative x2.  She is endorsing right-sided pleuritic chest pain and has asymmetric lower extremity edema, however, chronically anticoagulated with Eliquis.  Not hypoxic when awake but sats dropped to 88% while asleep in the ED and was placed on 2.5 L supplemental oxygen.  Currently stable with no signs of respiratory distress.  Mild hypoxia could possibly be due to underlying sleep apnea. ?-Continue supplemental oxygen ?-Bilateral lower extremity Dopplers ordered to rule out DVT ?-Continue Eliquis ?-Unable to do CT angiogram chest due to AKI and low GFR.  VQ scan ordered to rule out PE. ?-Will benefit from outpatient sleep study. ?

## 2021-08-17 NOTE — Assessment & Plan Note (Signed)
Current rhythm is sinus bradycardia. ?-Hold metoprolol, Cardizem, amiodarone ?-Continue Eliquis ?

## 2021-08-17 NOTE — Progress Notes (Signed)
Pt admitted to rm 4E11 from ED. CHG wipe given. Oriented pt to the unit. Initiated tele. VSS. Call bell within reach.  ? ?Lavenia Atlas, RN ? ?

## 2021-08-17 NOTE — ED Notes (Signed)
Pt ambulating in hallway with PT.  

## 2021-08-17 NOTE — Assessment & Plan Note (Signed)
Blood pressure currently stable. ?-Hold metoprolol and Cardizem given bradycardia ?

## 2021-08-17 NOTE — ED Notes (Signed)
Walked patient to the bathroom patient did well 

## 2021-08-17 NOTE — Assessment & Plan Note (Signed)
-  Continue Eliquis and Crestor ?

## 2021-08-17 NOTE — Assessment & Plan Note (Addendum)
Heart rate currently in the 40s, sinus rhythm.  EKG showing first-degree AV block.  Blood pressure is stable.  She is on metoprolol, Cardizem, and amiodarone.  She is no longer taking digoxin. ?-Cardiac monitoring ?-Hold AV nodal blocking agents and antiarrhythmics at this time ?-TSH ?

## 2021-08-17 NOTE — H&P (Signed)
?History and Physical  ? ? ?Candice Perez:[id number] DOB: 01-08-1951 DOA: 08/16/2021 ? ?PCP: Candice Hipps, MD ? ?Patient coming from: Home ? ?Chief Complaint: Shortness of breath, leg swelling ? ?HPI: Candice Perez is a 71 y.o. female with medical history significant of A-fib on Eliquis, history of DVT, left MCA stroke in July 2022 with mild residual spastic hemiparesis and expressive aphasia, chronic systolic CHF, prediabetes, hypertension, hyperlipidemia, hypothyroidism, GERD, peripheral neuropathy presented to ED with complaints of shortness of breath and leg swelling.  Found to be bradycardic with heart rate in 30s to 40s, not hypotensive.  EKG showing sinus bradycardia with first-degree AV block.  Labs showing no leukocytosis.  Hemoglobin 11.4, no significant change from baseline.  BUN 35, creatinine 2.3 (baseline 1.0-1.3).  High-sensitivity troponin negative x2.  BNP 285.  Digoxin level low.  Magnesium normal.  Chest x-ray showing stable cardiomegaly and no active disease.  ED physician discussed the case with on-call cardiologist who recommended admission for observation and holding any AV nodal blocking agents. ? ?Patient is complaining of shortness of breath, dizziness, and right-sided pleuritic chest pain.  States she was discharged from [facility] about 10 days ago after being treated for congestive heart failure and urinary tract infection.  States her legs are swollen but she thinks the swelling has improved since she is taking a total of Lasix 60 mg daily.  States her left leg is always more swollen than the right leg.  States digoxin was stopped when she was discharged from [facility].  She does not remember the name of her other medications. ? ?Review of Systems:  ?Review of Systems  ?All other systems reviewed and are negative. ? ?Past Medical History:  ?Diagnosis Date  ? A-fib ([place])   ? Arthritis   ? DVT (deep venous thrombosis) ([place])   ? Edema   ? GERD (gastroesophageal reflux  disease)   ? OCCASIONAL - PRILOSEC IF NEEDED  ? Hyperlipidemia   ? Hypertension   ? no meds now  ? Hypothyroidism   ? Varicose veins   ? ? ?Past Surgical History:  ?Procedure Laterality Date  ? CHOLECYSTECTOMY    ? Laparoscopic  ? CYSTOCELE REPAIR    ? HERNIA REPAIR    ? IR CT HEAD LTD  12/02/2020  ? IR PERCUTANEOUS ART THROMBECTOMY/INFUSION INTRACRANIAL INC DIAG ANGIO  12/02/2020  ? RADIOLOGY WITH ANESTHESIA N/A 12/02/2020  ? Procedure: IR WITH ANESTHESIA;  Surgeon: Luanne Bras, MD;  Location: [place];  Service: Radiology;  Laterality: N/A;  ? TONSILLECTOMY    ? TOTAL KNEE ARTHROPLASTY Left 08/26/2013  ? Procedure: LEFT TOTAL KNEE REVISION;  Surgeon: Mauri Pole, MD;  Location: [facility];  Service: Orthopedics;  Laterality: Left;  ? VARICOSE VEIN SURGERY    ? ? ? reports that she quit smoking about 10 years ago. Her smoking use included cigarettes. She has never used smokeless tobacco. She reports that she does not drink alcohol and does not use drugs. ? ?No Known Allergies ? ?Family History  ?Problem Relation Age of Onset  ? Congestive Heart Failure Mother   ? Cancer Mother   ?     tongue/sinus  ? Cancer Father   ?     "in his back"  ? Other Father   ?     Varicose veins, Arteriosclerosis  ? ? ?Prior to Admission medications   ?Medication Sig Start Date End Date Taking? Authorizing Provider  ?acetaminophen (TYLENOL) 650 MG CR tablet Take 1,300  mg by mouth every 8 (eight) hours as needed for pain.   Yes [provider]  ?amiodarone (PACERONE) 200 MG tablet Take 1 tablet (200 mg total) by mouth daily. 12/27/20  Yes Angiulli, Lavon Paganini, PA-C  ?apixaban (ELIQUIS) 5 MG TABS tablet Take 1 tablet (5 mg total) by mouth 2 (two) times daily. 12/27/20  Yes Angiulli, Lavon Paganini, PA-C  ?diclofenac Sodium (VOLTAREN) 1 % GEL Apply 2 g topically 4 (four) times daily. ?Patient taking differently: Apply 2 g topically 2 (two) times daily as needed (hand pain). 12/27/20  Yes Angiulli, Lavon Paganini, PA-C  ?DILT-XR 240 MG 24 hr capsule  Take 240 mg by mouth every morning. 05/04/21  Yes [provider]  ?furosemide (LASIX) 20 MG tablet Take 1 tablet (20 mg total) by mouth daily as needed. ?Patient taking differently: Take 20 mg by mouth daily as needed for fluid or edema. 07/22/21  Yes Raulkar, Clide Deutscher, MD  ?furosemide (LASIX) 40 MG tablet Take 40 mg by mouth every morning. 05/04/21  Yes [provider]  ?gabapentin (NEURONTIN) 400 MG capsule Take 1 capsule (400 mg total) by mouth 3 (three) times daily. 08/09/21  Yes Raulkar, Clide Deutscher, MD  ?levothyroxine (SYNTHROID) 100 MCG tablet Take 1 tablet (100 mcg total) by mouth daily at 6 (six) AM. 12/27/20  Yes Angiulli, Lavon Paganini, PA-C  ?metoprolol tartrate (LOPRESSOR) 50 MG tablet Take 1 tablet (50 mg total) by mouth 3 (three) times daily. ?Patient taking differently: Take 50 mg by mouth 2 (two) times daily. 12/28/20  Yes Angiulli, Lavon Paganini, PA-C  ?potassium chloride (KLOR-CON) 10 MEQ tablet Take 10 mEq by mouth daily. 02/04/21  Yes [provider]  ?rosuvastatin (CRESTOR) 20 MG tablet Take 1 tablet (20 mg total) by mouth daily. 12/27/20  Yes Angiulli, Lavon Paganini, PA-C  ?TART CHERRY PO Take 500 mg by mouth 3 (three) times daily.   Yes [provider]  ?Acetylcysteine (NAC) 600 MG CAPS Take 1 capsule (600 mg total) by mouth 2 (two) times daily. ?Patient not taking: Reported on 08/17/2021 04/18/21   Candice Ribas, MD  ?cefdinir (OMNICEF) 300 MG capsule Take 300 mg by mouth See admin instructions. Bid x days ?Patient not taking: Reported on 08/17/2021 08/08/21   [provider]  ?digoxin (LANOXIN) 0.125 MG tablet Take 1 tablet (0.125 mg total) by mouth daily. ?Patient not taking: Reported on 08/17/2021 12/27/20   Candice Parsons, PA-C  ? ? ?Physical Exam: ?Vitals:  ? 08/17/21 0330 08/17/21 0400 08/17/21 0430 08/17/21 0500  ?BP: (!) 148/66 (!) 159/67 (!) 152/64 (!) 147/119  ?Pulse: (!) 45 (!) 46 (!) 45 (!) 44  ?Resp: '14 17 14 14  '$ ?Temp:      ?TempSrc:      ?SpO2: 91% 93%  91% 97%  ?Weight:      ?Height:      ? ? ?Physical Exam ?Constitutional:   ?   General: She is not in acute distress. ?HENT:  ?   Head: Normocephalic and atraumatic.  ?Eyes:  ?   Extraocular Movements: Extraocular movements intact.  ?   Conjunctiva/sclera: Conjunctivae normal.  ?Cardiovascular:  ?   Rate and Rhythm: Normal rate and regular rhythm.  ?   Pulses: Normal pulses.  ?Pulmonary:  ?   Effort: Pulmonary effort is normal. No respiratory distress.  ?   Breath sounds: No wheezing or rales.  ?Abdominal:  ?   General: Bowel sounds are normal.  ?   Palpations: Abdomen is soft.  ?  Tenderness: There is no abdominal tenderness. There is no guarding.  ?Musculoskeletal:  ?   Cervical back: Normal range of motion.  ?   Right lower leg: Edema present.  ?   Left lower leg: Edema present.  ?   Comments: 2+ pitting edema of bilateral lower extremities ?Left lower extremity appears larger in size  ?Skin: ?   General: Skin is warm and dry.  ?Neurological:  ?   General: No focal deficit present.  ?   Mental Status: She is alert and oriented to person, place, and time.  ?  ? ?Labs on Admission: I have personally reviewed following labs and imaging studies ? ?CBC: ?Recent Labs  ?Lab 08/16/21 ?2300 08/16/21 ?2315  ?WBC 8.8  --   ?NEUTROABS 5.3  --   ?HGB 11.4* 12.2  ?HCT 37.8 36.0  ?MCV 94.5  --   ?PLT 304  --   ? ?Basic Metabolic Panel: ?Recent Labs  ?Lab 08/16/21 ?2300 08/16/21 ?2309 08/16/21 ?2315  ?NA 141  --  142  ?K 4.5  --  4.4  ?CL 106  --  103  ?CO2 26  --   --   ?GLUCOSE 125*  --  124*  ?BUN 35*  --  37*  ?CREATININE 2.36*  --  2.40*  ?CALCIUM 9.3  --   --   ?MG  --  2.2  --   ? ?GFR: ?Estimated Creatinine Clearance: 28.6 mL/min (A) (by C-G formula based on SCr of 2.4 mg/dL (H)). ?Liver Function Tests: ?Recent Labs  ?Lab 08/16/21 ?2300  ?AST 27  ?ALT 34  ?ALKPHOS 61  ?BILITOT 0.4  ?PROT 6.6  ?ALBUMIN 3.5  ? ?No results for input(s): LIPASE, AMYLASE in the last 168 hours. ?No results for input(s): AMMONIA in the last  168 hours. ?Coagulation Profile: ?No results for input(s): INR, PROTIME in the last 168 hours. ?Cardiac Enzymes: ?No results for input(s): CKTOTAL, CKMB, CKMBINDEX, TROPONINI in the last 168 hours. ?BNP (las

## 2021-08-17 NOTE — Assessment & Plan Note (Signed)
Echo done in July 2022 showing EF 40 to 45%. BNP 285.  Patient believes her peripheral edema has improved.  Chest x-ray not suggestive of pulmonary edema. ?-Holding Lasix at this time given AKI ?-Monitor volume status closely ?

## 2021-08-17 NOTE — Assessment & Plan Note (Signed)
Continue Crestor 

## 2021-08-17 NOTE — Assessment & Plan Note (Signed)
AKI on CKD stage II-IIIa ?BUN 35, creatinine 2.3 (baseline 1.0-1.3).  Recently admitted to [facility] for decompensated CHF and diuresed.  She is taking a total of Lasix 60 mg daily since discharge.  AKI possibly due to overdiuresis. ?-Hold Lasix and continue to monitor renal function ?-Avoid nephrotoxic agents/contrast ?-Urinalysis ?-Urine sodium and creatinine ?-Renal ultrasound ?

## 2021-08-17 NOTE — ED Provider Notes (Signed)
?[place] ?Provider Note ? ? ?CSN: [id number] ?Arrival date & time: 08/16/21  2200 ? ?  ? ?History ? ?Chief Complaint  ?Patient presents with  ? Chest Pain  ? Shortness of Breath  ? ? ?Candice Perez is a 71 y.o. female. ? ?Patient with exertional dizziness and dyspnea and weakness. Recently admitted to [facility] for CHF and diuresed. Digoxin stopped at that time. No other changes to meds. Still on metoprolol and cardizem. H/o DVT. LE edema is significantly improved over recent hospitalization.  ? ? ?Chest Pain ?Pain location:  Substernal area ?Pain quality: aching   ?Pain radiates to:  Does not radiate ?Pain severity:  Mild ?Onset quality:  Gradual ?Associated symptoms: shortness of breath   ?Shortness of Breath ?Associated symptoms: chest pain   ? ?  ? ?Home Medications ?Prior to Admission medications   ?Medication Sig Start Date End Date Taking? Authorizing Provider  ?acetaminophen (TYLENOL) 650 MG CR tablet Take 1,300 mg by mouth every 8 (eight) hours as needed for pain.   Yes [provider]  ?amiodarone (PACERONE) 200 MG tablet Take 1 tablet (200 mg total) by mouth daily. 12/27/20  Yes Angiulli, Lavon Paganini, PA-C  ?apixaban (ELIQUIS) 5 MG TABS tablet Take 1 tablet (5 mg total) by mouth 2 (two) times daily. 12/27/20  Yes Angiulli, Lavon Paganini, PA-C  ?diclofenac Sodium (VOLTAREN) 1 % GEL Apply 2 g topically 4 (four) times daily. ?Patient taking differently: Apply 2 g topically 2 (two) times daily as needed (hand pain). 12/27/20  Yes Angiulli, Lavon Paganini, PA-C  ?DILT-XR 240 MG 24 hr capsule Take 240 mg by mouth every morning. 05/04/21  Yes [provider]  ?furosemide (LASIX) 20 MG tablet Take 1 tablet (20 mg total) by mouth daily as needed. ?Patient taking differently: Take 20 mg by mouth daily as needed for fluid or edema. 07/22/21  Yes Raulkar, Clide Deutscher, MD  ?furosemide (LASIX) 40 MG tablet Take 40 mg by mouth every morning. 05/04/21  Yes [provider]   ?gabapentin (NEURONTIN) 400 MG capsule Take 1 capsule (400 mg total) by mouth 3 (three) times daily. 08/09/21  Yes Raulkar, Clide Deutscher, MD  ?levothyroxine (SYNTHROID) 100 MCG tablet Take 1 tablet (100 mcg total) by mouth daily at 6 (six) AM. 12/27/20  Yes Angiulli, Lavon Paganini, PA-C  ?metoprolol tartrate (LOPRESSOR) 50 MG tablet Take 1 tablet (50 mg total) by mouth 3 (three) times daily. ?Patient taking differently: Take 50 mg by mouth 2 (two) times daily. 12/28/20  Yes Angiulli, Lavon Paganini, PA-C  ?potassium chloride (KLOR-CON) 10 MEQ tablet Take 10 mEq by mouth daily. 02/04/21  Yes [provider]  ?rosuvastatin (CRESTOR) 20 MG tablet Take 1 tablet (20 mg total) by mouth daily. 12/27/20  Yes Angiulli, Lavon Paganini, PA-C  ?TART CHERRY PO Take 500 mg by mouth 3 (three) times daily.   Yes [provider]  ?Acetylcysteine (NAC) 600 MG CAPS Take 1 capsule (600 mg total) by mouth 2 (two) times daily. ?Patient not taking: Reported on 08/17/2021 04/18/21   Izora Ribas, MD  ?cefdinir (OMNICEF) 300 MG capsule Take 300 mg by mouth See admin instructions. Bid x days ?Patient not taking: Reported on 08/17/2021 08/08/21   [provider]  ?digoxin (LANOXIN) 0.125 MG tablet Take 1 tablet (0.125 mg total) by mouth daily. ?Patient not taking: Reported on 08/17/2021 12/27/20   Cathlyn Parsons, PA-C  ?   ? ?Allergies    ?Patient has no known  allergies.   ? ?Review of Systems   ?Review of Systems  ?Respiratory:  Positive for shortness of breath.   ?Cardiovascular:  Positive for chest pain.  ? ?Physical Exam ?Updated Vital Signs ?BP (!) 147/119   Pulse (!) 44   Temp 97.7 ?F (36.5 ?C) (Oral)   Resp 14   Ht '5\' 7"'$  (1.702 m)   Wt 121.1 kg   SpO2 97%   BMI 41.81 kg/m?  ?Physical Exam ?Vitals and nursing note reviewed.  ?Constitutional:   ?   Appearance: She is well-developed.  ?HENT:  ?   Head: Normocephalic and atraumatic.  ?Cardiovascular:  ?   Rate and Rhythm: Regular rhythm. Bradycardia present.  ?Pulmonary:  ?    Effort: No respiratory distress.  ?   Breath sounds: No stridor. No decreased breath sounds or wheezing.  ?Abdominal:  ?   General: There is no distension.  ?   Palpations: Abdomen is soft.  ?Musculoskeletal:     ?   General: Normal range of motion.  ?   Cervical back: Normal range of motion.  ?   Right lower leg: No edema.  ?   Left lower leg: No edema.  ?Skin: ?   General: Skin is warm and dry.  ?Neurological:  ?   General: No focal deficit present.  ?   Mental Status: She is alert.  ? ? ?ED Results / Procedures / Treatments   ?Labs ?(all labs ordered are listed, but only abnormal results are displayed) ?Labs Reviewed  ?COMPREHENSIVE METABOLIC PANEL - Abnormal; Notable for the following components:  ?    Result Value  ? Glucose, Bld 125 (*)   ? BUN 35 (*)   ? Creatinine, Ser 2.36 (*)   ? GFR, Estimated 21 (*)   ? All other components within normal limits  ?CBC WITH DIFFERENTIAL/PLATELET - Abnormal; Notable for the following components:  ? Hemoglobin 11.4 (*)   ? All other components within normal limits  ?BRAIN NATRIURETIC PEPTIDE - Abnormal; Notable for the following components:  ? B Natriuretic Peptide 285.3 (*)   ? All other components within normal limits  ?DIGOXIN LEVEL - Abnormal; Notable for the following components:  ? Digoxin Level 0.3 (*)   ? All other components within normal limits  ?I-STAT CHEM 8, ED - Abnormal; Notable for the following components:  ? BUN 37 (*)   ? Creatinine, Ser 2.40 (*)   ? Glucose, Bld 124 (*)   ? All other components within normal limits  ?MAGNESIUM  ?URINALYSIS, ROUTINE W REFLEX MICROSCOPIC  ?TSH  ?BASIC METABOLIC PANEL  ?SODIUM, URINE, RANDOM  ?CREATININE, URINE, RANDOM  ?TROPONIN I (HIGH SENSITIVITY)  ?TROPONIN I (HIGH SENSITIVITY)  ? ? ?EKG ?EKG Interpretation ? ?Date/Time:  Tuesday August 16 2021 22:32:46 EDT ?Ventricular Rate:  39 ?PR Interval:  276 ?QRS Duration: 84 ?QT Interval:  526 ?QTC Calculation: 423 ?R Axis:   7 ?Text Interpretation: Marked sinus bradycardia with  1st degree A-V block Low voltage QRS Cannot rule out Anterior infarct , age undetermined Abnormal ECG No previous ECGs available Confirmed by Merrily Pew [phone]) on 08/16/2021 10:56:25 PM ? ?Radiology ?US RENAL ? ?Result Date: 08/17/2021 ?CLINICAL DATA:  Acute kidney injury EXAM: RENAL / URINARY TRACT ULTRASOUND COMPLETE COMPARISON:  Abdominal CT 08/26/2014.  06/21/2021 renal ultrasound FINDINGS: Right Kidney: Not visualized sonographically Left Kidney: Renal measurements: 10.2 x 5.8 x 4.4 cm = volume: 135 mL. Echogenicity within normal limits. No mass or hydronephrosis visualized. Bladder: Appears normal for  degree of bladder distention. IMPRESSION: 1. Normal appearance of the left kidney. 2. Nonvisualized right kidney which is severely atrophic based on prior imaging Electronically Signed   By: Jorje Guild M.D.   On: 08/17/2021 06:39  ? ?DG Chest Port 1 View ? ?Result Date: 08/16/2021 ?CLINICAL DATA:  Dyspnea EXAM: PORTABLE CHEST 1 VIEW COMPARISON:  08/06/2021 FINDINGS: Lungs are clear. No pneumothorax or pleural effusion. Cardiac size is mildly enlarged. Pulmonary vascularity is normal. No acute bone abnormality. IMPRESSION: No active disease.  Stable cardiomegaly. Electronically Signed   By: Fidela Salisbury M.D.   On: 08/16/2021 23:17   ? ?Procedures ?Marland KitchenCritical Care ?Performed by: Merrily Pew, MD ?Authorized by: Merrily Pew, MD  ? ?Critical care provider statement:  ?  Critical care time (minutes):  32 ?  Critical care time was exclusive of:  Separately billable procedures and treating other patients and teaching time ?  Critical care was necessary to treat or prevent imminent or life-threatening deterioration of the following conditions:  Cardiac failure ?  Critical care was time spent personally by me on the following activities:  Development of treatment plan with patient or surrogate, evaluation of patient's response to treatment, examination of patient, obtaining history from patient or surrogate, review  of old charts, re-evaluation of patient's condition, pulse oximetry, ordering and performing treatments and interventions and ordering and review of laboratory studies  ?  ?Medications Ordered in ED ?Medicatio

## 2021-08-18 DIAGNOSIS — R001 Bradycardia, unspecified: Secondary | ICD-10-CM | POA: Diagnosis not present

## 2021-08-18 LAB — CBC
HCT: 35.8 % — ABNORMAL LOW (ref 36.0–46.0)
Hemoglobin: 11.2 g/dL — ABNORMAL LOW (ref 12.0–15.0)
MCH: 28.4 pg (ref 26.0–34.0)
MCHC: 31.3 g/dL (ref 30.0–36.0)
MCV: 90.9 fL (ref 80.0–100.0)
Platelets: 259 10*3/uL (ref 150–400)
RBC: 3.94 MIL/uL (ref 3.87–5.11)
RDW: 14 % (ref 11.5–15.5)
WBC: 6.8 10*3/uL (ref 4.0–10.5)
nRBC: 0 % (ref 0.0–0.2)

## 2021-08-18 LAB — BASIC METABOLIC PANEL
Anion gap: 8 (ref 5–15)
BUN: 24 mg/dL — ABNORMAL HIGH (ref 8–23)
CO2: 26 mmol/L (ref 22–32)
Calcium: 9 mg/dL (ref 8.9–10.3)
Chloride: 104 mmol/L (ref 98–111)
Creatinine, Ser: 1.59 mg/dL — ABNORMAL HIGH (ref 0.44–1.00)
GFR, Estimated: 34 mL/min — ABNORMAL LOW (ref >60)
Glucose, Bld: 105 mg/dL — ABNORMAL HIGH (ref 70–99)
Potassium: 4.3 mmol/L (ref 3.5–5.1)
Sodium: 138 mmol/L (ref 135–145)

## 2021-08-18 MED ORDER — HYDRALAZINE HCL 25 MG PO TABS
25.0000 mg | ORAL_TABLET | Freq: Three times a day (TID) | ORAL | Status: DC
  Administered 2021-08-18 – 2021-08-19 (×3): 25 mg via ORAL
  Filled 2021-08-18 (×3): qty 1

## 2021-08-18 MED ORDER — FUROSEMIDE 40 MG PO TABS
40.0000 mg | ORAL_TABLET | Freq: Every day | ORAL | Status: DC
  Administered 2021-08-18 – 2021-08-19 (×2): 40 mg via ORAL
  Filled 2021-08-18 (×2): qty 1

## 2021-08-18 MED ORDER — POTASSIUM CHLORIDE ER 20 MEQ PO TBCR: 20.0000 meq | EXTENDED_RELEASE_TABLET | Freq: Every day | ORAL | 0 refills | Status: DC

## 2021-08-18 MED ORDER — AMLODIPINE BESYLATE 5 MG PO TABS
5.0000 mg | ORAL_TABLET | Freq: Every day | ORAL | Status: AC
  Administered 2021-08-18: 5 mg via ORAL
  Filled 2021-08-18: qty 1

## 2021-08-18 MED ORDER — HYDRALAZINE HCL 20 MG/ML IJ SOLN
10.0000 mg | Freq: Once | INTRAMUSCULAR | Status: DC
  Filled 2021-08-18: qty 1

## 2021-08-18 MED ORDER — AMLODIPINE BESYLATE 5 MG PO TABS: 5.0000 mg | ORAL_TABLET | Freq: Every day | ORAL | 0 refills | Status: DC

## 2021-08-18 NOTE — Progress Notes (Signed)
Physical Therapy Treatment ?Patient Details ?Name: Candice Perez ?MRN: [id number] ?DOB: Jun 25, 1950 ?Today's Date: 08/18/2021 ? ? ?History of Present Illness 71 yo female presents to [facility] on 4/4 with pain under R side of breast, LE swelling, bradycardia with EKG showing 1st degree AV block. Recent d/c from [facility] for UTI, HF. PMH includes A-fib on Eliquis, history of DVT, left MCA stroke 11/2020 with residual spastic hemiparesis and expressive aphasia, chronic systolic CHF, prediabetes, hypertension, hyperlipidemia, hypothyroidism, GERD, peripheral neuropathy. ? ?  ?PT Comments  ? ? Pt received in supine, son present in room, pt agreeable to therapy session with emphasis on BP assessment with positional changes, mobility to/from bathroom (clearance for ambulation to bathroom per RN, distance limited by therapist for safety due to hypertension) and self-monitoring for s/sx due to elevated BP during session. *RN/MD notified of pt significant hypertension during PT session, pt denies headache or lightheadedness. Pt given HEP handout per goals and given visual/verbal demo for technique, defer teach back method due to pt elevated BP at time of session. Encouraged pt to self-monitor BP at home and defer exercise when significantly elevated (SBP >180 or per MD instruction if given more specific instruction at [place]).  ?Orthostatic BPs ?Supine 172/83 (109); HR 57 bpm  ?Sitting 182/98 (123); HR 58 bpm  ?Standing 191/100 (127); HR 75 bpm*  ?Pt continues to benefit from PT services to progress toward functional mobility goals.   ?Recommendations for follow up therapy are one component of a multi-disciplinary discharge planning process, led by the attending physician.  Recommendations may be updated based on patient status, additional functional criteria and insurance authorization. ? ?Follow Up Recommendations ? No PT follow up ?  ?  ?Assistance Recommended at Discharge Set up Supervision/Assistance  ?Patient can return  home with the following A little help with bathing/dressing/bathroom ?  ?Equipment Recommendations ? None recommended by PT  ?  ?Recommendations for Other Services   ? ? ?  ?Precautions / Restrictions Precautions ?Precautions: Fall ?Precaution Comments: severe HTN, bradycardia ?Restrictions ?Weight Bearing Restrictions: No  ?  ? ?Mobility ? Bed Mobility ?Overal bed mobility: Modified Independent ?  ?  ?  ?Supine to sit: Modified independent (Device/Increase time) ?Sit to supine: Modified independent (Device/Increase time) ?  ?General bed mobility comments: mod I for increased time and use of bed rails. ?  ? ?Transfers ?Overall transfer level: Modified independent ?Equipment used: None ?  ?  ?  ?  ?  ?  ?  ?General transfer comment: increased time to rise, no physical assist from toilet or EOB ?  ? ?Ambulation/Gait ?Ambulation/Gait assistance: Supervision ?Gait Distance (Feet): 20 Feet ?Assistive device: None ?Gait Pattern/deviations: Step-through pattern, Decreased stride length ?  ?  ?  ?General Gait Details: for safety only, RLE min spastic gait which is chronic; gait distance limited due to pt significant hypertension, did get RN clearance to ambulate to bathroom and back; pt asx, no headache or lightheadedness with mobility ? ? ?  ?Balance Overall balance assessment: Mild deficits observed, not formally tested ?  ?  ?  ?   ?  ?  ?  ? ?  ?Cognition Arousal/Alertness: Awake/alert ?Behavior During Therapy: [facility] for tasks assessed/performed ?Overall Cognitive Status: Within Functional Limits for tasks assessed ?  ?   ?  ?General Comments: alert, agreeable, pt reports no headache or other sx of elevated BP, good insight into deficits and command following ?  ?  ? ?  ?Exercises Other Exercises ?Other Exercises: verbal/visual  review for HEP handout given, defer to have pt perform due to elevated BP during session with sitting/standing tasks; link: [email or web address] Access Code: [email or web address] ?Other Exercises: supine  BLE AROM: ankle pumps x10 reps ea ? ?  ?General Comments General comments (skin integrity, edema, etc.): HR 52-75 bpm from rest to exertion/gait, BP significant elevated (see above), MD/RN notified and vitals entered in orthostatics flowsheet ?  ?  ? ?Pertinent Vitals/Pain Pain Assessment ?Pain Assessment: No/denies pain ?Pain Intervention(s): Monitored during session, Repositioned  ? ? ? ?PT Goals (current goals can now be found in the care plan section) Acute Rehab PT Goals ?Patient Stated Goal: to go home ?PT Goal Formulation: With patient ?Time For Goal Achievement: 08/31/21 ?Progress towards PT goals: Progressing toward goals ? ?  ?Frequency ? ? ? Min 3X/week ? ? ? ?  ?PT Plan Current plan remains appropriate  ? ? ?   ?AM-PAC PT "6 Clicks" Mobility   ?Outcome Measure ? Help needed turning from your back to your side while in a flat bed without using bedrails?: None ?Help needed moving from lying on your back to sitting on the side of a flat bed without using bedrails?: None ?Help needed moving to and from a bed to a chair (including a wheelchair)?: A Little ?Help needed standing up from a chair using your arms (e.g., wheelchair or bedside chair)?: None ?Help needed to walk in hospital room?: A Little ?Help needed climbing 3-5 steps with a railing? : A Little ?6 Click Score: 21 ? ?  ?End of Session   ?Activity Tolerance: Treatment limited secondary to medical complications (Comment) (significant hypertension in  standing (BP 191/100 (127))) ?Patient left: in bed;with call bell/phone within reach;with family/visitor present (son present) ?Nurse Communication: Mobility status;Other (comment) (HTN, especially in standing) ?PT Visit Diagnosis: Other abnormalities of gait and mobility (R26.89);Muscle weakness (generalized) (M62.81) ?  ? ? ?Time: 9381-8299 ?PT Time Calculation (min) (ACUTE ONLY): 39 min ? ?Charges:  $Gait Training: 8-22 mins ?$Therapeutic Exercise: 8-22 mins ?$Therapeutic Activity: 8-22 mins           ?          ? ?Seraphina Mitchner P., PTA ?Acute Rehabilitation Services ?Secure Chat Preferred 9a-5:30pm ?Office: [phone]  ? ? ?Kara Pacer Bartholomew Ramesh ?08/18/2021, 12:09 PM ? ?

## 2021-08-18 NOTE — Discharge Summary (Addendum)
Physician Discharge Summary  ?Candice Perez QQP:[id number] DOB: [birth year] DOA: 08/16/2021 ? ?PCP: Ronita Hipps, MD ? ?Admit date: 08/16/2021 ?Discharge date: 08/19/2021 ? ?Time spent: 45 minutes ? ?Recommendations for Outpatient Follow-up:  ?Cardiology Dr. Doylene Canard in 1 to 2 weeks, Toprol and diltiazem discontinued on account of symptomatic bradycardia, restart at low-dose if heart rate trends up again ?PCP in 1 week ? ? ?Discharge Diagnoses:  ?Principal Problem: ?  Symptomatic bradycardia ?Acute kidney injury ?  Dyspnea ?  AKI (acute kidney injury) ([place]) ?  Paroxysmal A-fib ([place]) ?  History of stroke ?  Essential hypertension ?  HLD (hyperlipidemia) ?  Hypothyroidism ?  Chronic systolic CHF (congestive heart failure) ([place]) ? ? ?Discharge Condition: Stable ? ?Diet recommendation: Low-sodium, heart healthy ? ?Filed Weights  ? 08/17/21 0026 08/17/21 1641  ?Weight: 121.1 kg 123.9 kg  ? ? ?History of present illness:  ?70/F with history of chronic systolic and diastolic CHF, paroxysmal atrial fibrillation, history of CVA with dysarthria and mild hemiparesis, prediabetes, obesity, hypertension, dyslipidemia, GERD presented to the ED with shortness of breath and chronic left leg swelling, she also reported some pleuritic right lower chest pain.  She was recently discharged from [facility] 10 days ago after treatment for CHF exacerbation, digoxin was discontinued then. ?-Reports compliance with diuretics and anticoagulation ?-In the ED she was noted to be bradycardic with first-degree AV block, labs noted worsening creatinine up to 2.3 from baseline of 1.4, dig level was low, chest x-ray was clear ?  ? ?Hospital Course:  ? ?Bradycardia ?-Could be contributing to some of her symptoms ?-Continue amiodarone, metoprolol and Cardizem were held ?-Digoxin was discontinued following recent hospitalization at [facility] ?-No events on telemetry, heart rate has improved to mid 50-60 range now ?-TSH was normal, discharged  home in a stable condition ?-Ambulating without symptoms, advised close follow-up with Dr. Doylene Canard in 1 to 2 weeks ?  ?Pleuritic chest pain ?-Likely musculoskeletal, now resolved ?-No evidence of ACS, troponins are negative, patient reports compliance with Eliquis ?-Dopplers and VQ scan were negative ?-Discussed need for sleep study ?  ?AKI on CKD 2 ?-Baseline creatinine around 1.0, recently discharged from [facility] after treatment for CHF and diuresis ?-Creatinine trended up to 2.4, improving now down to 1.5 today, diuretics resumed ?  ?Chronic systolic and diastolic CHF ?-Echo in 7/12 noted EF of 40-45% ?-Clinically appears euvolemic, BNP 285, chest x-ray is clear ?-Diuretics resumed, kidney function improved, she takes Lasix 40 Mg daily with extra 20 Mg as needed, follow-up with Dr. Doylene Canard ?  ?Hypothyroidism ?-Continue Synthroid, TSH stable ?  ?Paroxysmal A-fib ?-Resume amiodarone, hold metoprolol, Cardizem ?-Continue Eliquis ?  ?History of CVA ?-Continue Eliquis and Crestor ?  ? ? ?Discharge Exam: ?Vitals:  ? 08/18/21 2339 08/19/21 0356  ?BP: (!) 162/80 (!) 170/76  ?Pulse: 60 60  ?Resp: 20 18  ?Temp: 98.5 ?F (36.9 ?C) (!) 97.4 ?F (36.3 ?C)  ?SpO2: 98% 90%  ? ?Gen: Awake, Alert, Oriented X 3, mild dysarthria ?HEENT: no JVD ?Lungs: Good air movement bilaterally, CTAB ?CVS: S1S2/RRR ?Abd: soft, Non tender, non distended, BS present ?Extremities: No edema ?Skin: no new rashes on exposed skin  ? ?Discharge Instructions ? ? ?Discharge Instructions   ? ? Diet - low sodium heart healthy   Complete by: As directed ?  ? Diet - low sodium heart healthy   Complete by: As directed ?  ? Increase activity slowly   Complete by: As directed ?  ?  Increase activity slowly   Complete by: As directed ?  ? ?  ? ?Allergies as of 08/19/2021   ?No Known Allergies ?  ? ?  ?Medication List  ?  ? ?STOP taking these medications   ? ?cefdinir 300 MG capsule ?Commonly known as: OMNICEF ?  ?digoxin 0.125 MG tablet ?Commonly known as:  LANOXIN ?  ?Dilt-XR 240 MG 24 hr capsule ?Generic drug: diltiazem ?  ?metoprolol tartrate 50 MG tablet ?Commonly known as: LOPRESSOR ?  ?NAC 600 MG Caps ?Generic drug: Acetylcysteine ?  ? ?  ? ?TAKE these medications   ? ?acetaminophen 650 MG CR tablet ?Commonly known as: TYLENOL ?Take 1,300 mg by mouth every 8 (eight) hours as needed for pain. ?  ?amiodarone 200 MG tablet ?Commonly known as: PACERONE ?Take 1 tablet (200 mg total) by mouth daily. ?  ?amLODipine 10 MG tablet ?Commonly known as: NORVASC ?Take 1 tablet (10 mg total) by mouth daily. ?  ?apixaban 5 MG Tabs tablet ?Commonly known as: ELIQUIS ?Take 1 tablet (5 mg total) by mouth 2 (two) times daily. ?  ?diclofenac Sodium 1 % Gel ?Commonly known as: VOLTAREN ?Apply 2 g topically 4 (four) times daily. ?What changed:  ?when to take this ?reasons to take this ?  ?furosemide 40 MG tablet ?Commonly known as: LASIX ?Take 40 mg by mouth every morning. ?What changed: Another medication with the same name was changed. Make sure you understand how and when to take each. ?  ?furosemide 20 MG tablet ?Commonly known as: Lasix ?Take 1 tablet (20 mg total) by mouth daily as needed. ?What changed: reasons to take this ?  ?gabapentin 400 MG capsule ?Commonly known as: Neurontin ?Take 1 capsule (400 mg total) by mouth 3 (three) times daily. ?  ?hydrALAZINE 25 MG tablet ?Commonly known as: APRESOLINE ?Take 1 tablet (25 mg total) by mouth 2 (two) times daily. ?  ?levothyroxine 100 MCG tablet ?Commonly known as: SYNTHROID ?Take 1 tablet (100 mcg total) by mouth daily at 6 (six) AM. ?  ?Potassium Chloride ER 20 MEQ Tbcr ?Take 20 mEq by mouth daily. ?What changed:  ?medication strength ?how much to take ?  ?rosuvastatin 20 MG tablet ?Commonly known as: CRESTOR ?Take 1 tablet (20 mg total) by mouth daily. ?  ?TART CHERRY PO ?Take 500 mg by mouth 3 (three) times daily. ?  ? ?  ? ?No Known Allergies ? ? ? ?The results of significant diagnostics from this hospitalization (including  imaging, microbiology, ancillary and laboratory) are listed below for reference.   ? ?Significant Diagnostic Studies: ?NM Pulmonary Perfusion ? ?Result Date: 08/17/2021 ?CLINICAL DATA:  Pulmonary embolism suspected, high probability. EXAM: NUCLEAR MEDICINE PERFUSION LUNG SCAN TECHNIQUE: Perfusion images were obtained in multiple projections after intravenous injection of radiopharmaceutical. Ventilation scans intentionally deferred if perfusion scan and chest x-ray adequate for interpretation during COVID 19 epidemic. RADIOPHARMACEUTICALS:  4.2 mCi Tc-52mMAA IV COMPARISON:  Chest radiography yesterday FINDINGS: Negative perfusion study. Homogeneous perfusion pattern without identifiable defect. Enlarged cardiac silhouette. IMPRESSION: Normal appearance of the perfusion exam.  No sign of emboli. Electronically Signed   By: MNelson ChimesM.D.   On: 08/17/2021 13:49  ? ?[place]NAL ? ?Result Date: 08/17/2021 ?CLINICAL DATA:  Acute kidney injury EXAM: RENAL / URINARY TRACT ULTRASOUND COMPLETE COMPARISON:  Abdominal CT 08/26/2014.  06/21/2021 renal ultrasound FINDINGS: Right Kidney: Not visualized sonographically Left Kidney: Renal measurements: 10.2 x 5.8 x 4.4 cm = volume: 135 mL. Echogenicity within normal limits. No mass  or hydronephrosis visualized. Bladder: Appears normal for degree of bladder distention. IMPRESSION: 1. Normal appearance of the left kidney. 2. Nonvisualized right kidney which is severely atrophic based on prior imaging Electronically Signed   By: Jorje Guild M.D.   On: 08/17/2021 06:39  ? ?DG Chest Port 1 View ? ?Result Date: 08/16/2021 ?CLINICAL DATA:  Dyspnea EXAM: PORTABLE CHEST 1 VIEW COMPARISON:  08/06/2021 FINDINGS: Lungs are clear. No pneumothorax or pleural effusion. Cardiac size is mildly enlarged. Pulmonary vascularity is normal. No acute bone abnormality. IMPRESSION: No active disease.  Stable cardiomegaly. Electronically Signed   By: Fidela Salisbury M.D.   On: 08/16/2021 23:17  ? ?VAS [place]  LOWER EXTREMITY VENOUS (DVT) ? ?Result Date: 08/17/2021 ? Lower Venous DVT Study Patient Name:  MYLISSA LAMBE  Date of Exam:   08/17/2021 Medical Rec #: [id number]         Accession #:    [id number] Date of B

## 2021-08-18 NOTE — TOC Transition Note (Signed)
Transition of Care (TOC) - CM/SW Discharge Note ?Marvetta Gibbons Therapist, sports, BSN ?Transitions of Care ?Unit 4E- RN Case Manager ?See Treatment Team for direct phone #  ? ? ?Patient Details  ?Name: Candice Perez ?MRN: [id number] ?Date of Birth: January 23, 1951 ? ?Transition of Care (TOC) CM/SW Contact:  ?Dahlia Client, Romeo Rabon, RN ?Phone Number: ?08/18/2021, 12:09 PM ? ? ?Clinical Narrative:    ?Pt stable for transition home today, Transition of Care Department [facility]) has reviewed patient and no TOC needs have been identified at this time.  ? ?Final next level of care: Home/Self Care ?Barriers to Discharge: No Barriers Identified ? ? ?Patient Goals and CMS Choice ?  ?  ?Choice offered to / list presented to : NA ? ?Discharge Placement ?  ?           ? Home ?  ?  ?  ? ?Discharge Plan and Services ?  ?  ?           ?DME Arranged: N/A ?DME Agency: NA ?  ?  ?  ?HH Arranged: NA ?[place] Agency: NA ?  ?  ?  ? ?Social Determinants of Health (SDOH) Interventions ?  ? ? ?Readmission Risk Interventions ?   ? View : No data to display.  ?  ?  ?  ? ? ? ? ? ?

## 2021-08-18 NOTE — Progress Notes (Signed)
BP trending up this afternoon, 180-190 range now ?-add Po amlodipine and hydralazine, pt and family understandably reluctant to go home till BP better,  ?-will monitor this afternoon ? ?Domenic Polite, MD ?

## 2021-08-18 NOTE — Evaluation (Signed)
Occupational Therapy Evaluation ?Patient Details ?Name: Candice Perez ?MRN: [id number] ?DOB: [birth year] ?Today's Date: 73/10/2021 ? ? ?History of Present Illness 71 yo female presents to [facility] on 4/4 with pain under R side of breast, LE swelling, bradycardia with EKG showing 1st degree AV block. REcent d/c from [facility] for UTI, HF. PMH includes A-fib on Eliquis, history of DVT, left MCA stroke 11/2020 with residual spastic hemiparesis and expressive aphasia, chronic systolic CHF, prediabetes, hypertension, hyperlipidemia, hypothyroidism, GERD, peripheral neuropathy.  ? ?Clinical Impression ?  ?PTA patient modified independent with ADLs, mobility using cane/RW.  Admitted for above and presents near baseline modified independent level for ADLs and mobility in room using RW.  Pt with hx of CVA with R sided deficits, but baseline.  Hr ranged from 48-58 with elevated BP, but RN aware and provided medication during session. Pt reports having good support at home from spouse.  Based on performance today, no further OT needs identified and OT will sign off.  ?   ? ?Recommendations for follow up therapy are one component of a multi-disciplinary discharge planning process, led by the attending physician.  Recommendations may be updated based on patient status, additional functional criteria and insurance authorization.  ? ?Follow Up Recommendations ? No OT follow up  ?  ?Assistance Recommended at Discharge None  ?Patient can return home with the following Assist for transportation;Assistance with cooking/housework ? ?  ?Functional Status Assessment ?    ?Equipment Recommendations ? None recommended by OT  ?  ?Recommendations for Other Services   ? ? ?  ?Precautions / Restrictions Precautions ?Precautions: Fall ?Restrictions ?Weight Bearing Restrictions: No  ? ?  ? ?Mobility Bed Mobility ?Overal bed mobility: Modified Independent ?  ?  ?  ?  ?  ?  ?  ?  ? ?Transfers ?  ?  ?  ?  ?  ?  ?  ?  ?  ?  ?  ? ?  ?Balance Overall  balance assessment: Mild deficits observed, not formally tested ?  ?  ?  ?  ?  ?  ?  ?  ?  ?  ?  ?  ?  ?  ?  ?  ?  ?  ?   ? ?ADL either performed or assessed with clinical judgement  ? ?ADL Overall ADL's : Modified independent;At baseline ?  ?  ?  ?  ?  ?  ?  ?  ?  ?  ?  ?  ?  ?  ?  ?  ?  ?  ?  ?General ADL Comments: Pt demonstrates ability to complete ADls and mobility in room using RW with modified independence, baseline.  ? ? ? ?Vision   ?Vision Assessment?: No apparent visual deficits  ?   ?Perception   ?  ?Praxis   ?  ? ?Pertinent Vitals/Pain Pain Assessment ?Pain Assessment: 0-10 ?Pain Score: 4  ?Pain Location: Under R breast ?Pain Descriptors / Indicators: Sore, Discomfort ?Pain Intervention(s): Limited activity within patient's tolerance, Monitored during session, Repositioned  ? ? ? ?Hand Dominance Right ?  ?Extremity/Trunk Assessment Upper Extremity Assessment ?Upper Extremity Assessment: RUE deficits/detail ?RUE Deficits / Details: decreased sesnation R hand, and decreased [place] from prior CVA ?RUE Sensation: decreased light touch ?RUE Coordination: decreased fine motor ?  ?Lower Extremity Assessment ?Lower Extremity Assessment: Defer to PT evaluation ?  ?Cervical / Trunk Assessment ?Cervical / Trunk Assessment: Normal ?  ?Communication Communication ?Communication: Expressive difficulties ?  ?Cognition Arousal/Alertness:  Awake/alert ?Behavior During Therapy: [facility] for tasks assessed/performed ?Overall Cognitive Status: Within Functional Limits for tasks assessed ?  ?  ?  ?  ?  ?  ?  ?  ?  ?  ?  ?  ?  ?  ?  ?  ?  ?  ?  ?General Comments  HR 48-58 throughout session. BP elevated but RN provided medication. ? ?  ?Exercises   ?  ?Shoulder Instructions    ? ? ?Home Living Family/patient expects to be discharged to:: Private residence ?Living Arrangements: Spouse/significant other ?Available Help at Discharge: Family;Available 24 hours/day;Friend(s) ?Type of Home: Apartment ?Home Access: Level entry ?  ?  ?Home  Layout: One level ?  ?  ?Bathroom Shower/Tub: Walk-in shower ?  ?Bathroom Toilet: Handicapped height ?  ?  ?Home Equipment: BSC/3in1;Shower seat;Cane - quad;Rollator (4 wheels);Wheelchair - manual ?  ?  ?  ? ?  ?Prior Functioning/Environment Prior Level of Function : Independent/Modified Independent ?  ?  ?  ?  ?  ?  ?Mobility Comments: pt reports intermittently using quad cane or rollator. Pt reports since hospitalization ~10 days ago she has been having bilat foot pain ?ADLs Comments: independent ADLs, spouse assists with IADLs, not driving, manages meds ?  ? ?  ?  ?OT Problem List: Decreased strength;Decreased activity tolerance;Impaired balance (sitting and/or standing);Pain;Impaired UE functional use ?  ?   ?OT Treatment/Interventions:    ?  ?OT Goals(Current goals can be found in the care plan section) Acute Rehab OT Goals ?Patient Stated Goal: home today ?OT Goal Formulation: With patient ?Time For Goal Achievement: 09/01/21 ?Potential to Achieve Goals: Good  ?OT Frequency:   ?  ? ?Co-evaluation   ?  ?  ?  ?  ? ?  ?AM-PAC OT "6 Clicks" Daily Activity     ?Outcome Measure Help from another person eating meals?: None ?Help from another person taking care of personal grooming?: None ?Help from another person toileting, which includes using toliet, bedpan, or urinal?: None ?Help from another person bathing (including washing, rinsing, drying)?: None ?Help from another person to put on and taking off regular upper body clothing?: None ?Help from another person to put on and taking off regular lower body clothing?: None ?6 Click Score: 24 ?  ?End of Session Equipment Utilized During Treatment: Rolling walker (2 wheels) ?Nurse Communication: Mobility status ? ?Activity Tolerance: Patient tolerated treatment well ?Patient left: in bed;with call bell/phone within reach ? ?OT Visit Diagnosis: Unsteadiness on feet (R26.81);Pain ?Pain - Right/Left: Right ?Pain - part of body:  (R chest)  ?              ?Time:  7473-4037 ?OT Time Calculation (min): 19 min ?Charges:  OT General Charges ?$OT Visit: 1 Visit ?OT Evaluation ?$OT Eval Low Complexity: 1 Low ? ?Jolaine Artist, OT ?Acute Rehabilitation Services ?Pager [phone] ?Office [phone] ? ? ?Delight Stare ?08/18/2021, 9:28 AM ?

## 2021-08-18 NOTE — Care Management Obs Status (Signed)
MEDICARE OBSERVATION STATUS NOTIFICATION ? ? ?Patient Details  ?Name: Candice Perez ?MRN: [id number] ?Date of Birth: 05-01-1951 ? ? ?Medicare Observation Status Notification Given:  Yes ? ? ? ?Dawayne Patricia, RN ?08/18/2021, 4:07 PM ?

## 2021-08-19 LAB — BASIC METABOLIC PANEL
Anion gap: 8 (ref 5–15)
BUN: 20 mg/dL (ref 8–23)
CO2: 30 mmol/L (ref 22–32)
Calcium: 9.5 mg/dL (ref 8.9–10.3)
Chloride: 103 mmol/L (ref 98–111)
Creatinine, Ser: 1.48 mg/dL — ABNORMAL HIGH (ref 0.44–1.00)
GFR, Estimated: 37 mL/min — ABNORMAL LOW (ref >60)
Glucose, Bld: 96 mg/dL (ref 70–99)
Potassium: 4.2 mmol/L (ref 3.5–5.1)
Sodium: 141 mmol/L (ref 135–145)

## 2021-08-19 MED ORDER — AMLODIPINE BESYLATE 10 MG PO TABS: 10.0000 mg | ORAL_TABLET | Freq: Every day | ORAL | 0 refills | Status: DC

## 2021-08-19 MED ORDER — HYDRALAZINE HCL 25 MG PO TABS: 25.0000 mg | ORAL_TABLET | Freq: Two times a day (BID) | ORAL | 0 refills | Status: DC

## 2021-08-19 NOTE — Progress Notes (Signed)
D/C instructions given to patient. Medications reviewed. All questions answered. Son to escort pt home. ? ?Clyde Canterbury, RN ? ?

## 2021-08-27 DIAGNOSIS — I5022 Chronic systolic (congestive) heart failure: Secondary | ICD-10-CM | POA: Diagnosis not present

## 2021-08-27 DIAGNOSIS — I4821 Permanent atrial fibrillation: Secondary | ICD-10-CM | POA: Diagnosis not present

## 2021-08-27 DIAGNOSIS — I10 Essential (primary) hypertension: Secondary | ICD-10-CM | POA: Diagnosis not present

## 2021-09-07 IMAGING — DX DG CHEST 1V PORT
1 series · 1 of 1 positions shown · non-contrast
Comparison: 12/03/2020.

CLINICAL DATA: Acute respiratory failure.  Hypoxia.

EXAM:
PORTABLE CHEST 1 VIEW

[chest]
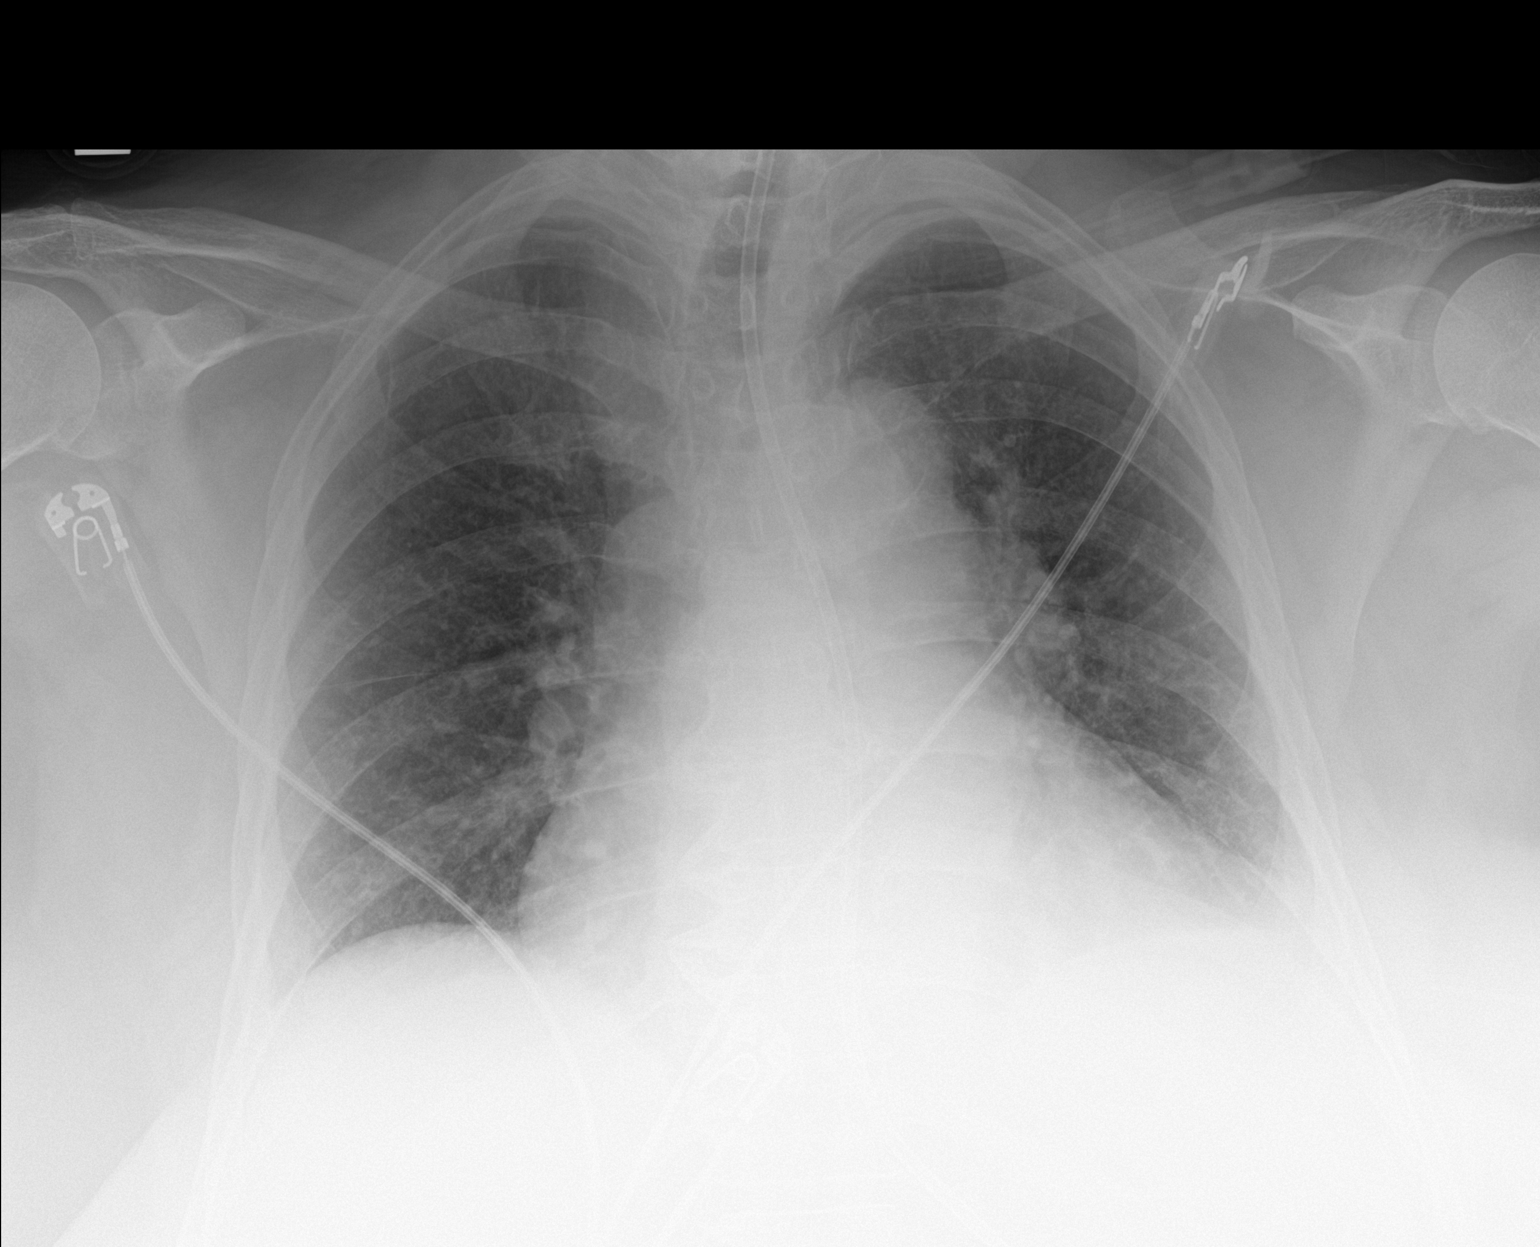

[1 of 1 positions shown; findings below may reference images not displayed]

FINDINGS: Feeding tube noted with tip below left hemidiaphragm. Stable
cardiomegaly. Low lung volumes with mild bibasilar atelectasis.
Small left pleural effusion no pneumothorax.
IMPRESSION: Feeding tube noted with tip below left hemidiaphragm.

2.  Stable cardiomegaly.

3. Low lung volumes with mild bibasilar atelectasis. Small left
pleural effusion.

## 2021-09-11 DIAGNOSIS — E039 Hypothyroidism, unspecified: Secondary | ICD-10-CM | POA: Diagnosis not present

## 2021-09-11 DIAGNOSIS — I4891 Unspecified atrial fibrillation: Secondary | ICD-10-CM | POA: Diagnosis not present

## 2021-09-11 DIAGNOSIS — I10 Essential (primary) hypertension: Secondary | ICD-10-CM | POA: Diagnosis not present

## 2021-09-23 DIAGNOSIS — I10 Essential (primary) hypertension: Secondary | ICD-10-CM | POA: Diagnosis not present

## 2021-09-27 ENCOUNTER — Encounter: Payer: Medicare HMO | Attending: Physical Medicine and Rehabilitation | Admitting: Physical Medicine and Rehabilitation

## 2021-09-27 ENCOUNTER — Encounter: Payer: Self-pay | Admitting: Physical Medicine and Rehabilitation

## 2021-09-27 VITALS — BP 130/78 | HR 60 | Ht 67.0 in | Wt 271.6 lb

## 2021-09-27 DIAGNOSIS — G609 Hereditary and idiopathic neuropathy, unspecified: Secondary | ICD-10-CM | POA: Insufficient documentation

## 2021-09-27 DIAGNOSIS — R001 Bradycardia, unspecified: Secondary | ICD-10-CM | POA: Insufficient documentation

## 2021-09-27 MED ORDER — GABAPENTIN 400 MG PO CAPS
400.0000 mg | ORAL_CAPSULE | Freq: Three times a day (TID) | ORAL | 3 refills | Status: DC
Start: 2021-09-27 — End: 2022-09-26

## 2021-09-27 NOTE — Progress Notes (Signed)
? ?Subjective:  ? ? Patient ID: Candice Perez, female    DOB: 01/15/1951, 71 y.o.   MRN: [id number] ? ?HPI ?Candice Perez is a 71 year old woman who presents for follow-up of MCA CVA, as well as for fatigue and peripheral neuropathy ? ?Sensation is decreased in right foot only. Left foot now has sensation. ? ?Pain is 0/10. It had responded very well to Qutenza and Gabapentin. The Gabapentin does make her sleepy.  ? ?In hospital she was found to have prediabetes.  ? ?She tries to follow a healthy diet at home ? ?Does not need Qutenza today ? ?Needs refill of Gabapentin.  ? ?She is using Lidocaine patch 4% on her foot which also helps. ? ?She does not check her CBGs at home ? ?HR dropped down to 25 and so she was taken to [facility]. She did not receive the Amiodarone.  ? ?Still taking Eliquis.  ? ?She asks if she may restart her vitamins- used to use a lot at home ? ?Notes worsening fatigue post-stroke ? ?Discussed the vascular ultrasound results and clot  ? ? ?Pain Inventory ?Average Pain 6 ?Pain Right Now 3 ?My pain is aching ? ?LOCATION OF PAIN  hand,ankles , toes ? ?BOWEL ?Number of stools per week: 5 ?Oral laxative use No  ?Type of laxative n/a ?Enema or suppository use No  ?History of colostomy No  ?Incontinent No  ? ?BLADDER ? ?In and out cath, frequency n/a ?Able to self cath No  ?Bladder incontinence Yes  ?Frequent urination No  ?Leakage with coughing No  ?Difficulty starting stream No  ?Incomplete bladder emptying No  ? ? ?Mobility ?use a cane ?use a wheelchair ?transfers alone ? ?Function ?not employed: date last employed   ?I need assistance with the following:  shopping ? ?Neuro/Psych ?bladder control problems ?weakness ?numbness ?trouble walking ? ?Prior Studies ?N/a ? ?Physicians involved in your care ?N/a ? ? ?Family History  ?Problem Relation Age of Onset  ? Congestive Heart Failure Mother   ? Cancer Mother   ?     tongue/sinus  ? Cancer Father   ?     "in his back"  ? Other Father   ?     Varicose  veins, Arteriosclerosis  ? ?Social History  ? ?Socioeconomic History  ? Marital status: Married  ?  Spouse name: Not on file  ? Number of children: 2  ? Years of education: 46  ? Highest education level: Not on file  ?Occupational History  ? Occupation: Retired  ?Tobacco Use  ? Smoking status: Former  ?  Years: 40.00  ?  Types: Cigarettes  ?  Quit date: 09/17/2010  ?  Years since quitting: 11.0  ? Smokeless tobacco: Never  ?Vaping Use  ? Vaping Use: Never used  ?Substance and Sexual Activity  ? Alcohol use: No  ? Drug use: No  ? Sexual activity: Not on file  ?Other Topics Concern  ? Not on file  ?Social History Narrative  ? Lives at home with her husband.  ? Right-handed.  ? No daily caffeine use.  ? ?Social Determinants of Health  ? ?Financial Resource Strain: Not on file  ?Food Insecurity: Not on file  ?Transportation Needs: Not on file  ?Physical Activity: Not on file  ?Stress: Not on file  ?Social Connections: Not on file  ? ?Past Surgical History:  ?Procedure Laterality Date  ? CHOLECYSTECTOMY    ? Laparoscopic  ? CYSTOCELE REPAIR    ?  HERNIA REPAIR    ? IR CT HEAD LTD  12/02/2020  ? IR PERCUTANEOUS ART THROMBECTOMY/INFUSION INTRACRANIAL INC DIAG ANGIO  12/02/2020  ? RADIOLOGY WITH ANESTHESIA N/A 12/02/2020  ? Procedure: IR WITH ANESTHESIA;  Surgeon: Luanne Bras, MD;  Location: [facility];  Service: Radiology;  Laterality: N/A;  ? TONSILLECTOMY    ? TOTAL KNEE ARTHROPLASTY Left 08/26/2013  ? Procedure: LEFT TOTAL KNEE REVISION;  Surgeon: Mauri Pole, MD;  Location: [facility];  Service: Orthopedics;  Laterality: Left;  ? VARICOSE VEIN SURGERY    ? ?Past Medical History:  ?Diagnosis Date  ? A-fib ([place])   ? Arthritis   ? DVT (deep venous thrombosis) ([place])   ? Edema   ? GERD (gastroesophageal reflux disease)   ? OCCASIONAL - PRILOSEC IF NEEDED  ? Hyperlipidemia   ? Hypertension   ? no meds now  ? Hypothyroidism   ? Varicose veins   ? ?BP 130/78   Pulse 60   Ht '5\' 7"'$  (1.702 m)   Wt 271 lb 9.6 oz (123.2 kg)   SpO2 94%    BMI 42.54 kg/m?  ? ?Opioid Risk Score:   ?Fall Risk Score:  `1 ? ?Depression screen PHQ 2/9 ? ? ?  09/27/2021  ? 12:00 PM 01/12/2021  ? 10:20 AM  ?Depression screen PHQ 2/9  ?Decreased Interest 0 0  ?Down, Depressed, Hopeless 0 0  ?PHQ - 2 Score 0 0  ?Altered sleeping  0  ?Tired, decreased energy  1  ?Change in appetite  0  ?Feeling bad or failure about yourself   0  ?Trouble concentrating  3  ?Moving slowly or fidgety/restless  3  ?Suicidal thoughts  0  ?PHQ-9 Score  7  ?Difficult doing work/chores  Somewhat difficult  ?  ? ? ? ?Review of Systems  ?Constitutional: Negative.   ?HENT: Negative.    ?Eyes: Negative.   ?Respiratory: Negative.    ?Cardiovascular:  Positive for leg swelling.  ?     Limb swelling  ?Gastrointestinal: Negative.   ?Endocrine: Negative.   ?Genitourinary: Negative.   ?Musculoskeletal:   ?     Pain in ankles , toes,   ?Skin: Negative.   ?Allergic/Immunologic: Negative.   ?Neurological:  Positive for weakness and numbness.  ?Hematological: Negative.   ?Psychiatric/Behavioral: Negative.    ? ?   ?Objective:  ? Physical Exam ?Gen: no distress, normal appearing ?HEENT: oral mucosa pink and moist, NCAT ?Cardio: Reg rate ?Chest: normal effort, normal rate of breathing ?Abd: soft, non-distended ?Ext: no edema ?Psych: pleasant, normal affect ?Skin: intact ?Neuro: Alert and oriented x3 ?Musculoskeletal: Ambulating well with cane.  ? ?   ?Assessment & Plan:  ?Candice Perez is a 71 year old woman who presents for hospital follow-up after CIR admission for CVA, as well as for prediabetes, peripheral neuropathy, and fatigue ? ?1) Prediabetes ?-avoid sugar, bread, pasta, rice ?-avoid snacking ?-try to incorporate into your diet some of the following foods which are good for diabetes: ?1) cinnamon- imitates effects of insulin, increasing glucose transport into cells ([place] or [place] cinnamon is best, least processed) ?2) nuts- can slow down the blood sugar response of carbohydrate rich foods ?3) oatmeal-  contains and anti-inflammatory compound avenanthramide ?4) whole-milk yogurt (best types are no sugar, [place] yogurt, or goat/sheep yogurt) ?5) beans- high in protein, fiber, and vitamins, low glycemic index ?6) broccoli- great source of vitamin A and C ?7) quinoa- higher in protein and fiber than other grains ?8) spinach- high in  vitamin A, fiber, and protein ?9) olive oil- reduces glucose levels, LDL, and triglycerides ?10) salmon- excellent amount of omega-3-fatty acids ?11) walnuts- rich in antioxidants ?12) apples- high in fiber and quercetin ?13) carrots- highly nutritious with low impact on blood sugar ?14) eggs- improve HDL (good cholesterol), high in protein, keep you satiated ?15) turmeric: improves blood sugars, cardiovascular disease, and protects kidney health ?16) garlic: improves blood sugar, blood pressure, pain ?17) tomatoes: highly nutritious with low impact on blood sugar  ?Recommended 1 glass water with 1 TB apple cider vinegar before meals to reduce CBG spike, has additional health benefits, drink with straw to protect enamel.   ? ?2) Impaired mobility following L MCA stroke ?-she requires larger wheelchair- current one she is renting is great- will provide script and fax to company ?-recommend daily walk outside, building distance daily ? ?3) Idiopathic peripheral neuropathy ?-Discussed Qutenza as an option for neuropathic pain control. Discussed that this is a capsaicin patch, stronger than capsaicin cream. Discussed that it is currently approved for diabetic peripheral neuropathy and post-herpetic neuralgia, but that it has also shown benefit in treating other forms of neuropathy. Provided patient with link to site to learn more about the patch: [employer]. Discussed that the patch would be placed in office and benefits usually last 3 months. Discussed that unintended exposure to capsaicin can cause severe irritation of eyes, mucous membranes, respiratory tract, and skin, but  that Qutenza is a local treatment and does not have the systemic side effects of other nerve medications. Discussed that there may be pain, itching, erythema, and decreased sensory function associated with t

## 2021-10-12 DIAGNOSIS — I4891 Unspecified atrial fibrillation: Secondary | ICD-10-CM | POA: Diagnosis not present

## 2021-10-12 DIAGNOSIS — I10 Essential (primary) hypertension: Secondary | ICD-10-CM | POA: Diagnosis not present

## 2021-10-12 DIAGNOSIS — E039 Hypothyroidism, unspecified: Secondary | ICD-10-CM | POA: Diagnosis not present

## 2021-11-08 DIAGNOSIS — Z79899 Other long term (current) drug therapy: Secondary | ICD-10-CM | POA: Diagnosis not present

## 2021-11-08 DIAGNOSIS — I10 Essential (primary) hypertension: Secondary | ICD-10-CM | POA: Diagnosis not present

## 2021-11-08 DIAGNOSIS — R300 Dysuria: Secondary | ICD-10-CM | POA: Diagnosis not present

## 2021-11-08 DIAGNOSIS — E039 Hypothyroidism, unspecified: Secondary | ICD-10-CM | POA: Diagnosis not present

## 2021-11-08 DIAGNOSIS — Z6841 Body Mass Index (BMI) 40.0 and over, adult: Secondary | ICD-10-CM | POA: Diagnosis not present

## 2021-11-11 DIAGNOSIS — I10 Essential (primary) hypertension: Secondary | ICD-10-CM | POA: Diagnosis not present

## 2021-11-11 DIAGNOSIS — I4891 Unspecified atrial fibrillation: Secondary | ICD-10-CM | POA: Diagnosis not present

## 2021-11-11 DIAGNOSIS — E039 Hypothyroidism, unspecified: Secondary | ICD-10-CM | POA: Diagnosis not present

## 2021-11-30 DIAGNOSIS — I5022 Chronic systolic (congestive) heart failure: Secondary | ICD-10-CM | POA: Diagnosis not present

## 2021-11-30 DIAGNOSIS — I340 Nonrheumatic mitral (valve) insufficiency: Secondary | ICD-10-CM | POA: Diagnosis not present

## 2021-11-30 DIAGNOSIS — I4821 Permanent atrial fibrillation: Secondary | ICD-10-CM | POA: Diagnosis not present

## 2021-12-12 DIAGNOSIS — I4891 Unspecified atrial fibrillation: Secondary | ICD-10-CM | POA: Diagnosis not present

## 2021-12-12 DIAGNOSIS — E039 Hypothyroidism, unspecified: Secondary | ICD-10-CM | POA: Diagnosis not present

## 2021-12-12 DIAGNOSIS — I10 Essential (primary) hypertension: Secondary | ICD-10-CM | POA: Diagnosis not present

## 2021-12-22 ENCOUNTER — Other Ambulatory Visit: Payer: Self-pay | Admitting: *Deleted

## 2021-12-22 DIAGNOSIS — N390 Urinary tract infection, site not specified: Secondary | ICD-10-CM | POA: Diagnosis not present

## 2021-12-22 DIAGNOSIS — R7303 Prediabetes: Secondary | ICD-10-CM | POA: Diagnosis not present

## 2021-12-22 DIAGNOSIS — I509 Heart failure, unspecified: Secondary | ICD-10-CM | POA: Diagnosis not present

## 2021-12-22 DIAGNOSIS — G629 Polyneuropathy, unspecified: Secondary | ICD-10-CM | POA: Diagnosis not present

## 2021-12-22 DIAGNOSIS — N183 Chronic kidney disease, stage 3 unspecified: Secondary | ICD-10-CM | POA: Diagnosis not present

## 2021-12-22 DIAGNOSIS — I129 Hypertensive chronic kidney disease with stage 1 through stage 4 chronic kidney disease, or unspecified chronic kidney disease: Secondary | ICD-10-CM | POA: Diagnosis not present

## 2021-12-22 DIAGNOSIS — I4891 Unspecified atrial fibrillation: Secondary | ICD-10-CM | POA: Diagnosis not present

## 2021-12-22 NOTE — Patient Outreach (Signed)
  Care Coordination   12/22/2021 Name: Candice Perez MRN: [id number] DOB: 1950-09-18   Care Coordination Outreach Attempts:  An unsuccessful telephone outreach was attempted today to offer the patient information about available care coordination services as a benefit of their health plan.   Follow Up Plan:  Additional outreach attempts will be made to offer the patient care coordination information and services.   Encounter Outcome:  No Answer  Care Coordination Interventions Activated:  No   Care Coordination Interventions:  No, not indicated    Emelia Loron RN, BSN [place] [phone] [email or web address]

## 2022-01-12 DIAGNOSIS — I4891 Unspecified atrial fibrillation: Secondary | ICD-10-CM | POA: Diagnosis not present

## 2022-01-12 DIAGNOSIS — I10 Essential (primary) hypertension: Secondary | ICD-10-CM | POA: Diagnosis not present

## 2022-01-12 DIAGNOSIS — E039 Hypothyroidism, unspecified: Secondary | ICD-10-CM | POA: Diagnosis not present

## 2022-02-16 DIAGNOSIS — E755 Other lipid storage disorders: Secondary | ICD-10-CM | POA: Diagnosis not present

## 2022-03-14 DIAGNOSIS — I4891 Unspecified atrial fibrillation: Secondary | ICD-10-CM | POA: Diagnosis not present

## 2022-03-14 DIAGNOSIS — E039 Hypothyroidism, unspecified: Secondary | ICD-10-CM | POA: Diagnosis not present

## 2022-03-14 DIAGNOSIS — I10 Essential (primary) hypertension: Secondary | ICD-10-CM | POA: Diagnosis not present

## 2022-03-21 DIAGNOSIS — I5022 Chronic systolic (congestive) heart failure: Secondary | ICD-10-CM | POA: Diagnosis not present

## 2022-03-21 DIAGNOSIS — I340 Nonrheumatic mitral (valve) insufficiency: Secondary | ICD-10-CM | POA: Diagnosis not present

## 2022-03-21 DIAGNOSIS — I480 Paroxysmal atrial fibrillation: Secondary | ICD-10-CM | POA: Diagnosis not present

## 2022-04-13 DIAGNOSIS — E039 Hypothyroidism, unspecified: Secondary | ICD-10-CM | POA: Diagnosis not present

## 2022-04-13 DIAGNOSIS — I4891 Unspecified atrial fibrillation: Secondary | ICD-10-CM | POA: Diagnosis not present

## 2022-04-13 DIAGNOSIS — I10 Essential (primary) hypertension: Secondary | ICD-10-CM | POA: Diagnosis not present

## 2022-04-18 ENCOUNTER — Telehealth: Payer: Self-pay

## 2022-04-18 NOTE — Patient Outreach (Signed)
  Care Coordination   Initial Visit Note   04/18/2022 Name: Candice Perez MRN: [id number] DOB: 1951-03-31  Candice Perez is a 71 y.o. year old female who sees Ronita Hipps, MD for primary care. I spoke with  Woodward Ku by phone today.  What matters to the patients health and wellness today?  Placed call to patient today and reviewed [facility] care coordination program.  Patient reports that she is doing well and denies any needs today.     SDOH assessments and interventions completed:  No     Care Coordination Interventions:  No, not indicated   Follow up plan: No further intervention required.   Encounter Outcome:  Pt. Refused   Tomasa Rand, RN, BSN, CEN [facility] [employer] [phone]

## 2022-05-17 DIAGNOSIS — Z1331 Encounter for screening for depression: Secondary | ICD-10-CM | POA: Diagnosis not present

## 2022-05-17 DIAGNOSIS — G629 Polyneuropathy, unspecified: Secondary | ICD-10-CM | POA: Diagnosis not present

## 2022-05-17 DIAGNOSIS — Z79899 Other long term (current) drug therapy: Secondary | ICD-10-CM | POA: Diagnosis not present

## 2022-05-17 DIAGNOSIS — Z6841 Body Mass Index (BMI) 40.0 and over, adult: Secondary | ICD-10-CM | POA: Diagnosis not present

## 2022-05-17 DIAGNOSIS — Z0000 Encounter for general adult medical examination without abnormal findings: Secondary | ICD-10-CM | POA: Diagnosis not present

## 2022-05-17 DIAGNOSIS — Z23 Encounter for immunization: Secondary | ICD-10-CM | POA: Diagnosis not present

## 2022-05-17 DIAGNOSIS — R300 Dysuria: Secondary | ICD-10-CM | POA: Diagnosis not present

## 2022-05-17 DIAGNOSIS — E039 Hypothyroidism, unspecified: Secondary | ICD-10-CM | POA: Diagnosis not present

## 2022-06-26 ENCOUNTER — Encounter: Payer: Self-pay | Admitting: Physical Medicine and Rehabilitation

## 2022-06-26 DIAGNOSIS — R609 Edema, unspecified: Secondary | ICD-10-CM

## 2022-06-27 MED ORDER — FUROSEMIDE 40 MG PO TABS: 40.0000 mg | ORAL_TABLET | Freq: Every morning | ORAL | 3 refills | Status: DC

## 2022-06-27 MED ORDER — FUROSEMIDE 20 MG PO TABS: 20.0000 mg | ORAL_TABLET | Freq: Every day | ORAL | 3 refills | Status: DC | PRN

## 2022-07-12 DIAGNOSIS — R7303 Prediabetes: Secondary | ICD-10-CM | POA: Diagnosis not present

## 2022-07-12 DIAGNOSIS — G629 Polyneuropathy, unspecified: Secondary | ICD-10-CM | POA: Diagnosis not present

## 2022-07-12 DIAGNOSIS — I129 Hypertensive chronic kidney disease with stage 1 through stage 4 chronic kidney disease, or unspecified chronic kidney disease: Secondary | ICD-10-CM | POA: Diagnosis not present

## 2022-07-12 DIAGNOSIS — N183 Chronic kidney disease, stage 3 unspecified: Secondary | ICD-10-CM | POA: Diagnosis not present

## 2022-07-12 DIAGNOSIS — I4891 Unspecified atrial fibrillation: Secondary | ICD-10-CM | POA: Diagnosis not present

## 2022-07-12 DIAGNOSIS — I509 Heart failure, unspecified: Secondary | ICD-10-CM | POA: Diagnosis not present

## 2022-07-17 DIAGNOSIS — I340 Nonrheumatic mitral (valve) insufficiency: Secondary | ICD-10-CM | POA: Diagnosis not present

## 2022-07-17 DIAGNOSIS — I480 Paroxysmal atrial fibrillation: Secondary | ICD-10-CM | POA: Diagnosis not present

## 2022-07-17 DIAGNOSIS — I5022 Chronic systolic (congestive) heart failure: Secondary | ICD-10-CM | POA: Diagnosis not present

## 2022-09-26 ENCOUNTER — Encounter: Payer: Self-pay | Admitting: Physical Medicine and Rehabilitation

## 2022-09-26 ENCOUNTER — Encounter: Payer: Medicare HMO | Attending: Physical Medicine and Rehabilitation | Admitting: Physical Medicine and Rehabilitation

## 2022-09-26 VITALS — BP 137/74 | HR 62 | Ht 67.0 in | Wt 310.0 lb

## 2022-09-26 DIAGNOSIS — I10 Essential (primary) hypertension: Secondary | ICD-10-CM | POA: Insufficient documentation

## 2022-09-26 DIAGNOSIS — I63512 Cerebral infarction due to unspecified occlusion or stenosis of left middle cerebral artery: Secondary | ICD-10-CM | POA: Diagnosis not present

## 2022-09-26 MED ORDER — GABAPENTIN 400 MG PO CAPS: 400.0000 mg | ORAL_CAPSULE | Freq: Every day | ORAL | 3 refills | Status: DC

## 2022-09-26 NOTE — Progress Notes (Signed)
Subjective:    Patient ID: Candice Perez, female    DOB: 04/25/1951, 72 y.o.   MRN: [id number]  HPI Candice Perez is a 72 year old woman who presents for follow-up of MCA CVA, as well as for fatigue and peripheral neuropathy  1) Decreased sensation in right foot and hand post-stroke Sensation is decreased in right foot only. Left foot now has sensation.  Pain is 0/10. It had responded very well to Qutenza and Gabapentin. The Gabapentin does make her sleepy.   In hospital she was found to have prediabetes.   She tries to follow a healthy diet at home  Does not need Qutenza today  Needs refill of Gabapentin.   She is using Lidocaine patch 4% on her foot which also helps.  She does not check her CBGs at home  HR dropped down to 25 and so she was taken to [facility]. She did not receive the Amiodarone.   Still taking Eliquis.   She asks if she may restart her vitamins- used to use a lot at home  Notes worsening fatigue post-stroke  Discussed the vascular ultrasound results and clot    Pain Inventory Average Pain 0 Pain Right Now 0 My pain is  No pain  LOCATION OF PAIN  hand,ankles , toes  BOWEL Number of stools per week: 7 Oral laxative use No    BLADDER Use Pads Bladder incontinence No  Frequent urination No  Leakage with coughing Yes    Mobility use a cane use a wheelchair transfers alone  Function not employed: date last employed   I need assistance with the following:  shopping  Neuro/Psych bladder control problems weakness numbness trouble walking  Prior Studies N/a  Physicians involved in your care N/a   Family History  Problem Relation Age of Onset   Congestive Heart Failure Mother    Cancer Mother        tongue/sinus   Cancer Father        "in his back"   Other Father        Varicose veins, Arteriosclerosis   Social History   Socioeconomic History   Marital status: Married    Spouse name: Not on file   Number of  children: 2   Years of education: 9   Highest education level: Not on file  Occupational History   Occupation: Retired  Tobacco Use   Smoking status: Former    Years: 40    Types: Cigarettes    Quit date: 09/17/2010    Years since quitting: 12.0   Smokeless tobacco: Never  Vaping Use   Vaping Use: Never used  Substance and Sexual Activity   Alcohol use: No   Drug use: No   Sexual activity: Not on file  Other Topics Concern   Not on file  Social History Narrative   Lives at home with her husband.   Right-handed.   No daily caffeine use.   Social Determinants of Health   Financial Resource Strain: Not on file  Food Insecurity: Not on file  Transportation Needs: Not on file  Physical Activity: Not on file  Stress: Not on file  Social Connections: Not on file   Past Surgical History:  Procedure Laterality Date   CHOLECYSTECTOMY     Laparoscopic   CYSTOCELE REPAIR     HERNIA REPAIR     IR CT HEAD LTD  12/02/2020   IR PERCUTANEOUS ART THROMBECTOMY/INFUSION INTRACRANIAL INC DIAG ANGIO  12/02/2020  RADIOLOGY WITH ANESTHESIA N/A 12/02/2020   Procedure: IR WITH ANESTHESIA;  Surgeon: Julieanne Cotton, MD;  Location: [facility];  Service: Radiology;  Laterality: N/A;   TONSILLECTOMY     TOTAL KNEE ARTHROPLASTY Left 08/26/2013   Procedure: LEFT TOTAL KNEE REVISION;  Surgeon: Shelda Pal, MD;  Location: [facility];  Service: Orthopedics;  Laterality: Left;   VARICOSE VEIN SURGERY     Past Medical History:  Diagnosis Date   A-fib (HCC)    Arthritis    DVT (deep venous thrombosis) (HCC)    Edema    GERD (gastroesophageal reflux disease)    OCCASIONAL - PRILOSEC IF NEEDED   Hyperlipidemia    Hypertension    no meds now   Hypothyroidism    Varicose veins    BP 137/74   Pulse 62   Ht 5\' 7"  (1.702 m)   Wt (!) 310 lb (140.6 kg)   SpO2 95%   BMI 48.55 kg/m   Opioid Risk Score:   Fall Risk Score:  `1  Depression screen [facility] 2/9     09/26/2022    9:19 AM 09/27/2021   12:00  PM 01/12/2021   10:20 AM  Depression screen PHQ 2/9  Decreased Interest 0 0 0  Down, Depressed, Hopeless 0 0 0  PHQ - 2 Score 0 0 0  Altered sleeping   0  Tired, decreased energy   1  Change in appetite   0  Feeling bad or failure about yourself    0  Trouble concentrating   3  Moving slowly or fidgety/restless   3  Suicidal thoughts   0  PHQ-9 Score   7  Difficult doing work/chores   Somewhat difficult       Review of Systems  Constitutional: Negative.   HENT: Negative.    Eyes: Negative.   Respiratory: Negative.    Cardiovascular:  Positive for leg swelling.       Limb swelling  Gastrointestinal: Negative.   Endocrine: Negative.   Genitourinary: Negative.   Musculoskeletal:        Pain in ankles , toes,   Skin: Negative.   Allergic/Immunologic: Negative.   Neurological:  Positive for weakness and numbness.  Hematological: Negative.   Psychiatric/Behavioral: Negative.         Objective:   Physical Exam Gen: no distress, normal appearing HEENT: oral mucosa pink and moist, NCAT Cardio: Reg rate Chest: normal effort, normal rate of breathing Abd: soft, non-distended Ext: no edema Psych: pleasant, normal affect Skin: intact Neuro: Alert and oriented x3 Musculoskeletal: Ambulating well with cane.      Assessment & Plan:  Candice Perez is a 72 year old woman who presents for hospital follow-up after CIR admission for CVA, as well as for prediabetes, peripheral neuropathy, and fatigue  1) Prediabetes -avoid sugar, bread, pasta, rice -avoid snacking -try to incorporate into your diet some of the following foods which are good for diabetes: 1) cinnamon- imitates effects of insulin, increasing glucose transport into cells ([place] or [place] cinnamon is best, least processed) 2) nuts- can slow down the blood sugar response of carbohydrate rich foods 3) oatmeal- contains and anti-inflammatory compound avenanthramide 4) whole-milk yogurt (best types are no sugar,  [place] yogurt, or goat/sheep yogurt) 5) beans- high in protein, fiber, and vitamins, low glycemic index 6) broccoli- great source of vitamin A and C 7) quinoa- higher in protein and fiber than other grains 8) spinach- high in vitamin A, fiber, and protein 9) olive oil-  reduces glucose levels, LDL, and triglycerides 10) salmon- excellent amount of omega-3-fatty acids 11) walnuts- rich in antioxidants 12) apples- high in fiber and quercetin 13) carrots- highly nutritious with low impact on blood sugar 14) eggs- improve HDL (good cholesterol), high in protein, keep you satiated 15) turmeric: improves blood sugars, cardiovascular disease, and protects kidney health 16) garlic: improves blood sugar, blood pressure, pain 17) tomatoes: highly nutritious with low impact on blood sugar  Recommended 1 glass water with 1 TB apple cider vinegar before meals to reduce CBG spike, has additional health benefits, drink with straw to protect enamel.    2) Impaired mobility following L MCA stroke -she requires larger wheelchair- current one she is renting is great- will provide script and fax to company -recommend daily walk outside, building distance daily -continue ambulating with RW  3) Idiopathic peripheral neuropathy -Discussed Qutenza as an option for neuropathic pain control. Discussed that this is a capsaicin patch, stronger than capsaicin cream. Discussed that it is currently approved for diabetic peripheral neuropathy and post-herpetic neuralgia, but that it has also shown benefit in treating other forms of neuropathy. Provided patient with link to site to learn more about the patch: https://www.clark.biz/. Discussed that the patch would be placed in office and benefits usually last 3 months. Discussed that unintended exposure to capsaicin can cause severe irritation of eyes, mucous membranes, respiratory tract, and skin, but that Qutenza is a local treatment and does not have the systemic side  effects of other nerve medications. Discussed that there may be pain, itching, erythema, and decreased sensory function associated with the application of Qutenza. Side effects usually subside within 1 week. A cold pack of analgesic medications can help with these side effects. Blood pressure can also be increased due to pain associated with administration of the patch.   Decrease gabapentin to 400mg  HS  4) Lower extremity pain -discussed vascular ultrasound results- show right lower extremity chronic DVT- discussed no change in management with this finding- her pain is in her left more than right leg  5) Bradycardia: -discussed to stay off digoxin and amiodarone given current bradycardia, and to follow up with cardiology  6) Decreased sensation in right foot and hand -discussed that this is likely related to her stroke and not her diabetes so is not likely to be improved by Qutenza

## 2022-09-26 NOTE — Patient Instructions (Signed)
HTN: Advised checking BP daily at home and logging results to bring into follow-up appointment with PCP and myself. -Reviewed BP meds today.  -Advised regarding healthy foods that can help lower blood pressure and provided with a list: 1) citrus foods- high in vitamins and minerals 2) salmon and other fatty fish - reduces inflammation and oxylipins 3) swiss chard (leafy green)- high level of nitrates 4) pumpkin seeds- one of the best natural sources of magnesium 5) Beans and lentils- high in fiber, magnesium, and potassium 6) Berries- high in flavonoids 7) Amaranth (whole grain, can be cooked similarly to rice and oats)- high in magnesium and fiber 8) Pistachios- even more effective at reducing BP than other nuts 9) Carrots- high in phenolic compounds that relax blood vessels and reduce inflammation 10) Celery- contain phthalides that relax tissues of arterial walls 11) Tomatoes- can also improve cholesterol and reduce risk of heart disease 12) Broccoli- good source of magnesium, calcium, and potassium 13) Greek yogurt: high in potassium and calcium 14) Herbs and spices: Celery seed, cilantro, saffron, lemongrass, black cumin, ginseng, cinnamon, cardamom, sweet basil, and ginger 15) Chia and flax seeds- also help to lower cholesterol and blood sugar 16) Beets- high levels of nitrates that relax blood vessels  17) spinach and bananas- high in potassium  -Provided lise of supplements that can help with hypertension:  1) magnesium: one high quality brand is Bioptemizers since it contains all 7 types of magnesium, otherwise over the counter magnesium gluconate 400mg is a good option 2) B vitamins 3) vitamin D 4) potassium 5) CoQ10 6) L-arginine 7) Vitamin C 8) Beetroot -Educated that goal BP is 120/80. -Made goal to incorporate some of the above foods into diet.   

## 2022-11-07 DIAGNOSIS — R300 Dysuria: Secondary | ICD-10-CM | POA: Diagnosis not present

## 2022-11-07 DIAGNOSIS — E039 Hypothyroidism, unspecified: Secondary | ICD-10-CM | POA: Diagnosis not present

## 2022-11-07 DIAGNOSIS — Z6841 Body Mass Index (BMI) 40.0 and over, adult: Secondary | ICD-10-CM | POA: Diagnosis not present

## 2022-11-07 DIAGNOSIS — Z79899 Other long term (current) drug therapy: Secondary | ICD-10-CM | POA: Diagnosis not present

## 2022-11-07 DIAGNOSIS — R609 Edema, unspecified: Secondary | ICD-10-CM | POA: Diagnosis not present

## 2022-11-15 ENCOUNTER — Other Ambulatory Visit: Payer: Self-pay

## 2022-11-15 ENCOUNTER — Inpatient Hospital Stay (HOSPITAL_COMMUNITY)
Admission: AD | Admit: 2022-11-15 | Discharge: 2022-11-24 | DRG: 286 | Disposition: A | Discharge status: Home Health | Payer: Medicare HMO | Attending: Cardiovascular Disease | Admitting: Cardiovascular Disease

## 2022-11-15 ENCOUNTER — Encounter (HOSPITAL_COMMUNITY): Payer: Self-pay | Admitting: Cardiovascular Disease

## 2022-11-15 ENCOUNTER — Inpatient Hospital Stay (HOSPITAL_COMMUNITY): Payer: Medicare HMO

## 2022-11-15 ENCOUNTER — Encounter (HOSPITAL_COMMUNITY): Payer: Self-pay

## 2022-11-15 DIAGNOSIS — Z6841 Body Mass Index (BMI) 40.0 and over, adult: Secondary | ICD-10-CM

## 2022-11-15 DIAGNOSIS — I5041 Acute combined systolic (congestive) and diastolic (congestive) heart failure: Secondary | ICD-10-CM | POA: Diagnosis not present

## 2022-11-15 DIAGNOSIS — I340 Nonrheumatic mitral (valve) insufficiency: Secondary | ICD-10-CM | POA: Diagnosis not present

## 2022-11-15 DIAGNOSIS — I130 Hypertensive heart and chronic kidney disease with heart failure and stage 1 through stage 4 chronic kidney disease, or unspecified chronic kidney disease: Secondary | ICD-10-CM | POA: Diagnosis not present

## 2022-11-15 DIAGNOSIS — Z8673 Personal history of transient ischemic attack (TIA), and cerebral infarction without residual deficits: Secondary | ICD-10-CM | POA: Diagnosis not present

## 2022-11-15 DIAGNOSIS — I2510 Atherosclerotic heart disease of native coronary artery without angina pectoris: Secondary | ICD-10-CM | POA: Diagnosis present

## 2022-11-15 DIAGNOSIS — Z79890 Hormone replacement therapy (postmenopausal): Secondary | ICD-10-CM | POA: Diagnosis not present

## 2022-11-15 DIAGNOSIS — E039 Hypothyroidism, unspecified: Secondary | ICD-10-CM | POA: Diagnosis present

## 2022-11-15 DIAGNOSIS — E785 Hyperlipidemia, unspecified: Secondary | ICD-10-CM | POA: Diagnosis not present

## 2022-11-15 DIAGNOSIS — I509 Heart failure, unspecified: Secondary | ICD-10-CM | POA: Diagnosis not present

## 2022-11-15 DIAGNOSIS — I272 Pulmonary hypertension, unspecified: Secondary | ICD-10-CM | POA: Diagnosis present

## 2022-11-15 DIAGNOSIS — D631 Anemia in chronic kidney disease: Secondary | ICD-10-CM | POA: Diagnosis not present

## 2022-11-15 DIAGNOSIS — E876 Hypokalemia: Secondary | ICD-10-CM | POA: Diagnosis not present

## 2022-11-15 DIAGNOSIS — Z79899 Other long term (current) drug therapy: Secondary | ICD-10-CM

## 2022-11-15 DIAGNOSIS — I5021 Acute systolic (congestive) heart failure: Secondary | ICD-10-CM | POA: Diagnosis not present

## 2022-11-15 DIAGNOSIS — R600 Localized edema: Secondary | ICD-10-CM | POA: Diagnosis not present

## 2022-11-15 DIAGNOSIS — Z86718 Personal history of other venous thrombosis and embolism: Secondary | ICD-10-CM

## 2022-11-15 DIAGNOSIS — E1122 Type 2 diabetes mellitus with diabetic chronic kidney disease: Secondary | ICD-10-CM | POA: Diagnosis present

## 2022-11-15 DIAGNOSIS — J9600 Acute respiratory failure, unspecified whether with hypoxia or hypercapnia: Secondary | ICD-10-CM | POA: Diagnosis present

## 2022-11-15 DIAGNOSIS — Z7901 Long term (current) use of anticoagulants: Secondary | ICD-10-CM | POA: Diagnosis not present

## 2022-11-15 DIAGNOSIS — I482 Chronic atrial fibrillation, unspecified: Secondary | ICD-10-CM | POA: Diagnosis not present

## 2022-11-15 DIAGNOSIS — M109 Gout, unspecified: Secondary | ICD-10-CM | POA: Diagnosis present

## 2022-11-15 DIAGNOSIS — Z96652 Presence of left artificial knee joint: Secondary | ICD-10-CM | POA: Diagnosis present

## 2022-11-15 DIAGNOSIS — N390 Urinary tract infection, site not specified: Secondary | ICD-10-CM | POA: Diagnosis present

## 2022-11-15 DIAGNOSIS — Z87891 Personal history of nicotine dependence: Secondary | ICD-10-CM

## 2022-11-15 DIAGNOSIS — I4821 Permanent atrial fibrillation: Secondary | ICD-10-CM | POA: Diagnosis not present

## 2022-11-15 DIAGNOSIS — K219 Gastro-esophageal reflux disease without esophagitis: Secondary | ICD-10-CM | POA: Diagnosis present

## 2022-11-15 DIAGNOSIS — N1831 Chronic kidney disease, stage 3a: Secondary | ICD-10-CM | POA: Diagnosis not present

## 2022-11-15 DIAGNOSIS — E119 Type 2 diabetes mellitus without complications: Secondary | ICD-10-CM | POA: Diagnosis not present

## 2022-11-15 DIAGNOSIS — Z8249 Family history of ischemic heart disease and other diseases of the circulatory system: Secondary | ICD-10-CM | POA: Diagnosis not present

## 2022-11-15 LAB — CBC WITH DIFFERENTIAL/PLATELET
Abs Immature Granulocytes: 0.03 10*3/uL (ref 0.00–0.07)
Basophils Absolute: 0 10*3/uL (ref 0.0–0.1)
Basophils Relative: 0 %
Eosinophils Absolute: 0.1 10*3/uL (ref 0.0–0.5)
Eosinophils Relative: 1 %
HCT: 37.9 % (ref 36.0–46.0)
Hemoglobin: 12 g/dL (ref 12.0–15.0)
Immature Granulocytes: 0 %
Lymphocytes Relative: 24 %
Lymphs Abs: 2.2 10*3/uL (ref 0.7–4.0)
MCH: 29.5 pg (ref 26.0–34.0)
MCHC: 31.7 g/dL (ref 30.0–36.0)
MCV: 93.1 fL (ref 80.0–100.0)
Monocytes Absolute: 0.7 10*3/uL (ref 0.1–1.0)
Monocytes Relative: 8 %
Neutro Abs: 6 10*3/uL (ref 1.7–7.7)
Neutrophils Relative %: 67 %
Platelets: 264 10*3/uL (ref 150–400)
RBC: 4.07 MIL/uL (ref 3.87–5.11)
RDW: 14.4 % (ref 11.5–15.5)
WBC: 9.1 10*3/uL (ref 4.0–10.5)
nRBC: 0 % (ref 0.0–0.2)

## 2022-11-15 LAB — COMPREHENSIVE METABOLIC PANEL
ALT: 21 U/L (ref 0–44)
AST: 19 U/L (ref 15–41)
Albumin: 3.9 g/dL (ref 3.5–5.0)
Alkaline Phosphatase: 45 U/L (ref 38–126)
Anion gap: 13 (ref 5–15)
BUN: 23 mg/dL (ref 8–23)
CO2: 25 mmol/L (ref 22–32)
Calcium: 9.3 mg/dL (ref 8.9–10.3)
Chloride: 105 mmol/L (ref 98–111)
Creatinine, Ser: 1.28 mg/dL — ABNORMAL HIGH (ref 0.44–1.00)
GFR, Estimated: 44 mL/min — ABNORMAL LOW (ref >60)
Glucose, Bld: 115 mg/dL — ABNORMAL HIGH (ref 70–99)
Potassium: 3.6 mmol/L (ref 3.5–5.1)
Sodium: 143 mmol/L (ref 135–145)
Total Bilirubin: 0.5 mg/dL (ref 0.3–1.2)
Total Protein: 6.9 g/dL (ref 6.5–8.1)

## 2022-11-15 LAB — BRAIN NATRIURETIC PEPTIDE: B Natriuretic Peptide: 387.6 pg/mL — ABNORMAL HIGH (ref 0.0–100.0)

## 2022-11-15 LAB — TSH: TSH: 5.142 u[IU]/mL — ABNORMAL HIGH (ref 0.350–4.500)

## 2022-11-15 MED ORDER — SODIUM CHLORIDE 0.9% FLUSH: 3.0000 mL | INTRAVENOUS | Status: DC | PRN

## 2022-11-15 MED ORDER — POTASSIUM CHLORIDE CRYS ER 20 MEQ PO TBCR
20.0000 meq | EXTENDED_RELEASE_TABLET | Freq: Every day | ORAL | Status: DC
  Administered 2022-11-16: 20 meq via ORAL
  Filled 2022-11-15: qty 1

## 2022-11-15 MED ORDER — HEPARIN SODIUM (PORCINE) 5000 UNIT/ML IJ SOLN: 5000.0000 [IU] | Freq: Three times a day (TID) | INTRAMUSCULAR | Status: DC

## 2022-11-15 MED ORDER — FUROSEMIDE 10 MG/ML IJ SOLN
40.0000 mg | Freq: Two times a day (BID) | INTRAMUSCULAR | Status: DC
  Administered 2022-11-15 – 2022-11-18 (×6): 40 mg via INTRAVENOUS
  Filled 2022-11-15 (×6): qty 4

## 2022-11-15 MED ORDER — ORAL CARE MOUTH RINSE: 15.0000 mL | OROMUCOSAL | Status: DC | PRN

## 2022-11-15 MED ORDER — ACETAMINOPHEN 325 MG PO TABS
650.0000 mg | ORAL_TABLET | ORAL | Status: DC | PRN
  Administered 2022-11-16 – 2022-11-24 (×6): 650 mg via ORAL
  Filled 2022-11-15 (×8): qty 2

## 2022-11-15 MED ORDER — GABAPENTIN 400 MG PO CAPS
400.0000 mg | ORAL_CAPSULE | Freq: Every day | ORAL | Status: DC
  Administered 2022-11-15 – 2022-11-23 (×9): 400 mg via ORAL
  Filled 2022-11-15 (×9): qty 1

## 2022-11-15 MED ORDER — ROSUVASTATIN CALCIUM 20 MG PO TABS
20.0000 mg | ORAL_TABLET | Freq: Every day | ORAL | Status: DC
  Administered 2022-11-16 – 2022-11-24 (×9): 20 mg via ORAL
  Filled 2022-11-15 (×9): qty 1

## 2022-11-15 MED ORDER — DIGOXIN 0.25 MG/ML IJ SOLN
0.2500 mg | Freq: Every day | INTRAMUSCULAR | Status: AC
  Administered 2022-11-15: 0.25 mg via INTRAVENOUS
  Filled 2022-11-15: qty 2

## 2022-11-15 MED ORDER — DILTIAZEM HCL 30 MG PO TABS
30.0000 mg | ORAL_TABLET | Freq: Four times a day (QID) | ORAL | Status: DC
  Administered 2022-11-15: 30 mg via ORAL
  Filled 2022-11-15: qty 1

## 2022-11-15 MED ORDER — ONDANSETRON HCL 4 MG/2ML IJ SOLN: 4.0000 mg | Freq: Four times a day (QID) | INTRAMUSCULAR | Status: DC | PRN

## 2022-11-15 MED ORDER — AMLODIPINE BESYLATE 10 MG PO TABS: 10.0000 mg | ORAL_TABLET | Freq: Every day | ORAL | Status: DC

## 2022-11-15 MED ORDER — SODIUM CHLORIDE 0.9 % IV SOLN: 250.0000 mL | INTRAVENOUS | Status: DC | PRN

## 2022-11-15 MED ORDER — SODIUM CHLORIDE 0.9% FLUSH
3.0000 mL | Freq: Two times a day (BID) | INTRAVENOUS | Status: DC
  Administered 2022-11-15 – 2022-11-23 (×9): 3 mL via INTRAVENOUS

## 2022-11-15 MED ORDER — HYDRALAZINE HCL 25 MG PO TABS
25.0000 mg | ORAL_TABLET | Freq: Two times a day (BID) | ORAL | Status: DC
  Administered 2022-11-15: 25 mg via ORAL
  Filled 2022-11-15: qty 1

## 2022-11-15 MED ORDER — APIXABAN 5 MG PO TABS
5.0000 mg | ORAL_TABLET | Freq: Two times a day (BID) | ORAL | Status: DC
  Administered 2022-11-15 – 2022-11-19 (×8): 5 mg via ORAL
  Filled 2022-11-15 (×8): qty 1

## 2022-11-15 MED ORDER — LEVOTHYROXINE SODIUM 112 MCG PO TABS
112.0000 ug | ORAL_TABLET | Freq: Every day | ORAL | Status: DC
  Administered 2022-11-16 – 2022-11-21 (×6): 112 ug via ORAL
  Filled 2022-11-15 (×6): qty 1

## 2022-11-15 NOTE — H&P (Signed)
Referring Physician:  SILVANNA PRICKETT is an 72 y.o. female.                       Chief Complaint: Shortness of breath and leg edema  HPI: 72 years old white female with PMH of atrial fibrillation, Left MCA area stroke, HTN, Hypothyroidism, DVT and morbid obesity has 1 month history of shortness of breath and bilateral leg edema. She was given Torsemide 20 mg. two daily by [facility] physician without significant diuresis over 1 month.  Past Medical History:  Diagnosis Date   A-fib (HCC)    Arthritis    DVT (deep venous thrombosis) (HCC)    Edema    GERD (gastroesophageal reflux disease)    OCCASIONAL - PRILOSEC IF NEEDED   Hyperlipidemia    Hypertension    no meds now   Hypothyroidism    Varicose veins       Past Surgical History:  Procedure Laterality Date   CHOLECYSTECTOMY     Laparoscopic   CYSTOCELE REPAIR     HERNIA REPAIR     IR CT HEAD LTD  12/02/2020   IR PERCUTANEOUS ART THROMBECTOMY/INFUSION INTRACRANIAL INC DIAG ANGIO  12/02/2020   RADIOLOGY WITH ANESTHESIA N/A 12/02/2020   Procedure: IR WITH ANESTHESIA;  Surgeon: Julieanne Cotton, MD;  Location: [facility] OR;  Service: Radiology;  Laterality: N/A;   TONSILLECTOMY     TOTAL KNEE ARTHROPLASTY Left 08/26/2013   Procedure: LEFT TOTAL KNEE REVISION;  Surgeon: Shelda Pal, MD;  Location: [facility];  Service: Orthopedics;  Laterality: Left;   VARICOSE VEIN SURGERY      Family History  Problem Relation Age of Onset   Congestive Heart Failure Mother    Cancer Mother        tongue/sinus   Cancer Father        "in his back"   Other Father        Varicose veins, Arteriosclerosis   Social History:  reports that she quit smoking about 12 years ago. Her smoking use included cigarettes. She has never used smokeless tobacco. She reports that she does not drink alcohol and does not use drugs.  Allergies: No Known Allergies  Medications Prior to Admission  Medication Sig Dispense Refill   acetaminophen (TYLENOL) 650 MG CR  tablet Take 1,300 mg by mouth every 8 (eight) hours as needed for pain. (Patient not taking: Reported on 09/27/2021)     amiodarone (PACERONE) 200 MG tablet Take 1 tablet (200 mg total) by mouth daily. (Patient not taking: Reported on 09/27/2021) 30 tablet 0   amLODipine (NORVASC) 10 MG tablet Take 1 tablet (10 mg total) by mouth daily. 30 tablet 0   apixaban (ELIQUIS) 5 MG TABS tablet Take 1 tablet (5 mg total) by mouth 2 (two) times daily. 60 tablet 0   furosemide (LASIX) 20 MG tablet Take 1 tablet (20 mg total) by mouth daily as needed. (Patient not taking: Reported on 09/26/2022) 90 tablet 3   furosemide (LASIX) 40 MG tablet Take 1 tablet (40 mg total) by mouth every morning. 90 tablet 3   gabapentin (NEURONTIN) 400 MG capsule Take 1 capsule (400 mg total) by mouth at bedtime. 90 capsule 3   hydrALAZINE (APRESOLINE) 25 MG tablet Take 1 tablet (25 mg total) by mouth 2 (two) times daily. 60 tablet 0   KLOR-CON M20 20 MEQ tablet Take 20 mEq by mouth daily.     levothyroxine (SYNTHROID) 100 MCG tablet Take 1  tablet (100 mcg total) by mouth daily at 6 (six) AM. 30 tablet 0   metoprolol tartrate (LOPRESSOR) 50 MG tablet Take 50 mg by mouth 2 (two) times daily. (Patient not taking: Reported on 09/26/2022)     rosuvastatin (CRESTOR) 20 MG tablet Take 1 tablet (20 mg total) by mouth daily. 30 tablet 0   TART CHERRY PO Take 500 mg by mouth 3 (three) times daily.      No results found for this or any previous visit (from the past 48 hour(s)). No results found.  Review Of Systems Constitutional: No fever, chills, weight loss or gain. Eyes: No vision change, wears glasses. No discharge or pain. Ears: No hearing loss, No tinnitus. Respiratory: No asthma, COPD, pneumonias. Positive shortness of breath. No hemoptysis. Cardiovascular: Positive chest pain, palpitation, leg edema. Gastrointestinal: No nausea, vomiting, diarrhea, positive constipation. No GI bleed. No hepatitis. Genitourinary: No dysuria,  hematuria, kidney stone. No incontinance. Neurological: No headache, stroke, seizures.  Psychiatry: No psych facility admission for anxiety, depression, suicide. No detox. Skin: No rash. Musculoskeletal: Positive joint pain, fibromyalgia, neck pain, back pain. Lymphadenopathy: No lymphadenopathy. Hematology: No anemia or easy bruising.   Height 5\' 7"  (1.702 m), weight (!) 141 kg. Body mass index is 48.69 kg/m. General appearance: alert, cooperative, appears stated age and moderate respiratory distress Head: Normocephalic, atraumatic. Eyes: Hazel eyes, pink conjunctiva, corneas clear.  Neck: No adenopathy, no carotid bruit, no JVD, supple, symmetrical, trachea midline and thyroid not enlarged. Resp: Clearing to auscultation bilaterally. Cardio: Irregular rate and rhythm, S1, S2 normal, II/VI systolic murmur, no click, rub or gallop GI: Soft, non-tender; bowel sounds normal; no organomegaly. Extremities: 2 + edema, no cyanosis or clubbing. Skin: Warm and dry.  Neurologic: Alert and oriented X 3, right sided weakness with thick speech. slow gait.  Assessment/Plan Acute diastolic left heart failure Chronic atrial fibrillation Acute respiratory failure HTN Hypothyroidism Morbid obesity Type 2 DM S/P stroke  Plan: Admit IV lasix. Echocardiogram.  Time spent: Review of old records, Lab, x-rays, EKG, other cardiac tests, examination, discussion with patient/Nurse over 70 minutes.  Ricki Rodriguez, MD  11/15/2022, 4:52 PM

## 2022-11-16 LAB — BASIC METABOLIC PANEL
Anion gap: 11 (ref 5–15)
BUN: 22 mg/dL (ref 8–23)
CO2: 29 mmol/L (ref 22–32)
Calcium: 8.7 mg/dL — ABNORMAL LOW (ref 8.9–10.3)
Chloride: 101 mmol/L (ref 98–111)
Creatinine, Ser: 1.24 mg/dL — ABNORMAL HIGH (ref 0.44–1.00)
GFR, Estimated: 46 mL/min — ABNORMAL LOW (ref >60)
Glucose, Bld: 102 mg/dL — ABNORMAL HIGH (ref 70–99)
Potassium: 3.2 mmol/L — ABNORMAL LOW (ref 3.5–5.1)
Sodium: 141 mmol/L (ref 135–145)

## 2022-11-16 MED ORDER — POTASSIUM CHLORIDE CRYS ER 20 MEQ PO TBCR
20.0000 meq | EXTENDED_RELEASE_TABLET | Freq: Three times a day (TID) | ORAL | Status: DC
  Administered 2022-11-16 – 2022-11-17 (×5): 20 meq via ORAL
  Filled 2022-11-16 (×5): qty 1

## 2022-11-16 MED ORDER — MAGNESIUM SULFATE 2 GM/50ML IV SOLN
2.0000 g | Freq: Once | INTRAVENOUS | Status: AC
  Administered 2022-11-16: 2 g via INTRAVENOUS
  Filled 2022-11-16: qty 50

## 2022-11-16 MED ORDER — CARVEDILOL 6.25 MG PO TABS
6.2500 mg | ORAL_TABLET | Freq: Two times a day (BID) | ORAL | Status: DC
  Administered 2022-11-16 – 2022-11-18 (×6): 6.25 mg via ORAL
  Filled 2022-11-16 (×6): qty 1

## 2022-11-16 MED ORDER — DIGOXIN 125 MCG PO TABS
0.0625 mg | ORAL_TABLET | Freq: Every day | ORAL | Status: DC
  Administered 2022-11-16 – 2022-11-24 (×9): 0.0625 mg via ORAL
  Filled 2022-11-16 (×9): qty 1

## 2022-11-16 MED ORDER — DILTIAZEM HCL 60 MG PO TABS
60.0000 mg | ORAL_TABLET | Freq: Four times a day (QID) | ORAL | Status: DC
  Administered 2022-11-16 – 2022-11-24 (×31): 60 mg via ORAL
  Filled 2022-11-16 (×31): qty 1

## 2022-11-16 NOTE — Progress Notes (Signed)
Ref: Marylen Ponto, MD   Subjective:  Feeling little better. HR is improving to 70-105/min. BNP is elevated. Echocardiogram is pending. Mild hypokalemia.  Objective:  Vital Signs in the last 24 hours: Temp:  [97.6 F (36.4 C)-98.3 F (36.8 C)] 97.7 F (36.5 C) (07/04 0715) Pulse Rate:  [70-146] 106 (07/04 0715) Cardiac Rhythm: Atrial fibrillation (07/04 0700) Resp:  [18-20] 18 (07/04 0715) BP: (94-126)/(70-110) 111/84 (07/04 0715) SpO2:  [92 %-96 %] 95 % (07/04 0715) Weight:  [140.3 kg-141 kg] 140.3 kg (07/04 0330)  Physical Exam: BP Readings from Last 1 Encounters:  11/16/22 111/84     Wt Readings from Last 1 Encounters:  11/16/22 (!) 140.3 kg    Weight change:  Body mass index is 48.44 kg/m. HEENT: [place]/AT, Eyes-Hazel,  Conjunctiva-Pink, Sclera-Non-icteric Neck: No JVD, No bruit, Trachea midline. Lungs:  Clearing, Bilateral. Cardiac:  Irregular rhythm, normal S1 and S2, no S3. II/VI systolic murmur. Abdomen:  Soft, non-tender. BS present. Extremities:  1 + edema present. No cyanosis. No clubbing. CNS: AxOx3, Cranial nerves grossly intact, moves all 4 extremities.  Skin: Warm and dry.   Intake/Output from previous day: 07/03 0701 - 07/04 0700 In: 480 [P.O.:480] Out: 1900 [Urine:1900]    Lab Results: BMET    Component Value Date/Time   NA 141 11/16/2022 0021   NA 143 11/15/2022 1700   NA 141 08/19/2021 0125   NA 142 06/15/2017 0930   K 3.2 (L) 11/16/2022 0021   K 3.6 11/15/2022 1700   K 4.2 08/19/2021 0125   CL 101 11/16/2022 0021   CL 105 11/15/2022 1700   CL 103 08/19/2021 0125   CO2 29 11/16/2022 0021   CO2 25 11/15/2022 1700   CO2 30 08/19/2021 0125   GLUCOSE 102 (H) 11/16/2022 0021   GLUCOSE 115 (H) 11/15/2022 1700   GLUCOSE 96 08/19/2021 0125   BUN 22 11/16/2022 0021   BUN 23 11/15/2022 1700   BUN 20 08/19/2021 0125   BUN 19 06/15/2017 0930   CREATININE 1.24 (H) 11/16/2022 0021   CREATININE 1.28 (H) 11/15/2022 1700   CREATININE 1.48 (H)  08/19/2021 0125   CALCIUM 8.7 (L) 11/16/2022 0021   CALCIUM 9.3 11/15/2022 1700   CALCIUM 9.5 08/19/2021 0125   GFRNONAA 46 (L) 11/16/2022 0021   GFRNONAA 44 (L) 11/15/2022 1700   GFRNONAA 37 (L) 08/19/2021 0125   GFRAA 68 06/15/2017 0930   GFRAA 64 (L) 08/28/2013 0429   GFRAA 77 (L) 08/27/2013 0456   CBC    Component Value Date/Time   WBC 9.1 11/15/2022 1700   RBC 4.07 11/15/2022 1700   HGB 12.0 11/15/2022 1700   HGB 12.9 06/15/2017 0930   HCT 37.9 11/15/2022 1700   HCT 38.6 06/15/2017 0930   PLT 264 11/15/2022 1700   PLT 265 06/15/2017 0930   MCV 93.1 11/15/2022 1700   MCV 86 06/15/2017 0930   MCH 29.5 11/15/2022 1700   MCHC 31.7 11/15/2022 1700   RDW 14.4 11/15/2022 1700   RDW 14.5 06/15/2017 0930   LYMPHSABS 2.2 11/15/2022 1700   MONOABS 0.7 11/15/2022 1700   EOSABS 0.1 11/15/2022 1700   BASOSABS 0.0 11/15/2022 1700   HEPATIC Function Panel Recent Labs    11/15/22 1700  PROT 6.9  ALBUMIN 3.9  AST 19  ALT 21  ALKPHOS 45   HEMOGLOBIN A1C Lab Results  Component Value Date   MPG 131.24 12/03/2020   CARDIAC ENZYMES No results found for: "CKTOTAL", "CKMB", "CKMBINDEX", "TROPONINI" BNP No results  for input(s): "PROBNP" in the last 8760 hours. TSH Recent Labs    11/15/22 1700  TSH 5.142*   CHOLESTEROL No results for input(s): "CHOL" in the last 8760 hours.  Scheduled Meds:  apixaban  5 mg Oral BID   carvedilol  6.25 mg Oral BID WC   digoxin  0.0625 mg Oral Daily   diltiazem  60 mg Oral Q6H   furosemide  40 mg Intravenous Q12H   gabapentin  400 mg Oral QHS   levothyroxine  112 mcg Oral Q0600   potassium chloride SA  20 mEq Oral TID   rosuvastatin  20 mg Oral Daily   sodium chloride flush  3 mL Intravenous Q12H   Continuous Infusions:  sodium chloride     magnesium sulfate bolus IVPB     PRN Meds:.sodium chloride, acetaminophen, ondansetron (ZOFRAN) IV, mouth rinse, sodium chloride flush  Assessment/Plan: Acute diastolic left heart  failure Chronic atrial fibrillation Acute respiratory failure HTN Hypothyroidism Morbid obesity Type 2 DM S/P stroke  Plan: Continue diuresis. Potassium and magnesium supplement.   LOS: 1 day   Time spent including chart review, lab review, examination, discussion with patient/Nurse : 30 min   [name]  MD  11/16/2022, 10:10 AM

## 2022-11-16 NOTE — Progress Notes (Signed)
HR in 110 to 130/min with atrial fibrillation. BP improving slowly post IV digoxin.  Plan: Add Carvedilol and increase diltiazem to 60 mg. q 6 hr. Discontinue hydralazine and amlodipine. One dose digoxin given earlier.  [name], MD 11/16/2022 12:08 AM

## 2022-11-17 ENCOUNTER — Inpatient Hospital Stay (HOSPITAL_COMMUNITY): Payer: Medicare HMO

## 2022-11-17 LAB — ECHOCARDIOGRAM COMPLETE
AR max vel: 3.58 cm2
AV Area VTI: 3.9 cm2
AV Area mean vel: 3.36 cm2
AV Mean grad: 4 mmHg
AV Peak grad: 7.1 mmHg
Ao pk vel: 1.33 m/s
Calc EF: 31.6 %
Height: 67 in
MV M vel: 3.68 m/s
MV Peak grad: 54.2 mmHg
S' Lateral: 5 cm
Single Plane A2C EF: 35.2 %
Single Plane A4C EF: 27.5 %
Weight: 4948.89 oz

## 2022-11-17 LAB — BASIC METABOLIC PANEL
Anion gap: 9 (ref 5–15)
BUN: 23 mg/dL (ref 8–23)
CO2: 29 mmol/L (ref 22–32)
Calcium: 8.7 mg/dL — ABNORMAL LOW (ref 8.9–10.3)
Chloride: 101 mmol/L (ref 98–111)
Creatinine, Ser: 1.39 mg/dL — ABNORMAL HIGH (ref 0.44–1.00)
GFR, Estimated: 40 mL/min — ABNORMAL LOW (ref >60)
Glucose, Bld: 109 mg/dL — ABNORMAL HIGH (ref 70–99)
Potassium: 4 mmol/L (ref 3.5–5.1)
Sodium: 139 mmol/L (ref 135–145)

## 2022-11-17 LAB — MAGNESIUM: Magnesium: 2.3 mg/dL (ref 1.7–2.4)

## 2022-11-17 MED ORDER — PERFLUTREN LIPID MICROSPHERE
1.0000 mL | INTRAVENOUS | Status: AC | PRN
  Administered 2022-11-17: 4 mL via INTRAVENOUS

## 2022-11-17 NOTE — Consult Note (Signed)
   [facility] Inpatient Consult   11/17/2022  SISTER SCHLAGETER [birth year] [id number]  [facility] [[facility]]  Accountable Care Organization [ACO] Patient: Candice Perez   [facility] Liaison remote coverage review for patient admitted to [facility] for admission follow up  Primary Care Provider:  Marylen Ponto, MD with [facility]   Patient screened for hospitalization with noted low risk score for unplanned readmission risk to assess for potential [facility]  [[facility]] Care Management service needs for post hospital transition for care coordination.  Review of patient's electronic medical record reveals patient is admitted with shortness of breath and Atrial Fib.   Plan:  Continue to follow progress and disposition to assess for post hospital community care coordination/management needs.  Referral request for community care coordination: pending disposition needs.  Of note, [facility] Care Management/Population Health does not replace or interfere with any arrangements made by the Inpatient Transition of Care team.  For questions contact:   Charlesetta Shanks, RN BSN CCM [facility]  [phone] business mobile phone Toll free office [phone]  *Concierge Line  [phone] Fax number: [phone] [email or web address]

## 2022-11-17 NOTE — Plan of Care (Signed)

## 2022-11-17 NOTE — Progress Notes (Signed)
Ref: Marylen Ponto, MD   Subjective:  Awake. Feeling little better. Leg edema and shortness of breath persist. Echocardiogram shows decrease in LV systolic function and moderate MR.  Objective:  Vital Signs in the last 24 hours: Temp:  [97.5 F (36.4 C)-98.2 F (36.8 C)] 97.5 F (36.4 C) (07/05 1636) Pulse Rate:  [60-100] 100 (07/05 1202) Cardiac Rhythm: Atrial fibrillation;Bundle branch block (07/05 1029) Resp:  [16-20] 16 (07/05 1202) BP: (98-119)/(44-80) 119/80 (07/05 1636) SpO2:  [90 %-95 %] 94 % (07/05 1641) Weight:  [140.3 kg] 140.3 kg (07/05 0500)  Physical Exam: BP Readings from Last 1 Encounters:  11/17/22 119/80     Wt Readings from Last 1 Encounters:  11/17/22 (!) 140.3 kg    Weight change: -0.7 kg Body mass index is 48.44 kg/m. HEENT: [place]/AT, Eyes-Brown, PERL, EOMI, Conjunctiva-Pink, Sclera-Non-icteric Neck: No JVD, No bruit, Trachea midline. Lungs:  Clear, Bilateral. Cardiac:  Regular rhythm, normal S1 and S2, no S3. II/VI systolic murmur. Abdomen:  Soft, non-tender. BS present. Extremities:  No edema present. No cyanosis. No clubbing. CNS: AxOx3, Cranial nerves grossly intact, moves all 4 extremities.  Skin: Warm and dry.   Intake/Output from previous day: 07/04 0701 - 07/05 0700 In: 363 [P.O.:360; I.V.:3] Out: 2151 [Urine:2150; Stool:1]    Lab Results: BMET    Component Value Date/Time   NA 139 11/17/2022 0039   NA 141 11/16/2022 0021   NA 143 11/15/2022 1700   NA 142 06/15/2017 0930   K 4.0 11/17/2022 0039   K 3.2 (L) 11/16/2022 0021   K 3.6 11/15/2022 1700   CL 101 11/17/2022 0039   CL 101 11/16/2022 0021   CL 105 11/15/2022 1700   CO2 29 11/17/2022 0039   CO2 29 11/16/2022 0021   CO2 25 11/15/2022 1700   GLUCOSE 109 (H) 11/17/2022 0039   GLUCOSE 102 (H) 11/16/2022 0021   GLUCOSE 115 (H) 11/15/2022 1700   BUN 23 11/17/2022 0039   BUN 22 11/16/2022 0021   BUN 23 11/15/2022 1700   BUN 19 06/15/2017 0930   CREATININE 1.39 (H)  11/17/2022 0039   CREATININE 1.24 (H) 11/16/2022 0021   CREATININE 1.28 (H) 11/15/2022 1700   CALCIUM 8.7 (L) 11/17/2022 0039   CALCIUM 8.7 (L) 11/16/2022 0021   CALCIUM 9.3 11/15/2022 1700   GFRNONAA 40 (L) 11/17/2022 0039   GFRNONAA 46 (L) 11/16/2022 0021   GFRNONAA 44 (L) 11/15/2022 1700   GFRAA 68 06/15/2017 0930   GFRAA 64 (L) 08/28/2013 0429   GFRAA 77 (L) 08/27/2013 0456   CBC    Component Value Date/Time   WBC 9.1 11/15/2022 1700   RBC 4.07 11/15/2022 1700   HGB 12.0 11/15/2022 1700   HGB 12.9 06/15/2017 0930   HCT 37.9 11/15/2022 1700   HCT 38.6 06/15/2017 0930   PLT 264 11/15/2022 1700   PLT 265 06/15/2017 0930   MCV 93.1 11/15/2022 1700   MCV 86 06/15/2017 0930   MCH 29.5 11/15/2022 1700   MCHC 31.7 11/15/2022 1700   RDW 14.4 11/15/2022 1700   RDW 14.5 06/15/2017 0930   LYMPHSABS 2.2 11/15/2022 1700   MONOABS 0.7 11/15/2022 1700   EOSABS 0.1 11/15/2022 1700   BASOSABS 0.0 11/15/2022 1700   HEPATIC Function Panel Recent Labs    11/15/22 1700  PROT 6.9  ALBUMIN 3.9  AST 19  ALT 21  ALKPHOS 45   HEMOGLOBIN A1C Lab Results  Component Value Date   MPG 131.24 12/03/2020   CARDIAC ENZYMES  No results found for: "CKTOTAL", "CKMB", "CKMBINDEX", "TROPONINI" BNP No results for input(s): "PROBNP" in the last 8760 hours. TSH Recent Labs    11/15/22 1700  TSH 5.142*   CHOLESTEROL No results for input(s): "CHOL" in the last 8760 hours.  Scheduled Meds:  apixaban  5 mg Oral BID   carvedilol  6.25 mg Oral BID WC   digoxin  0.0625 mg Oral Daily   diltiazem  60 mg Oral Q6H   furosemide  40 mg Intravenous Q12H   gabapentin  400 mg Oral QHS   levothyroxine  112 mcg Oral Q0600   potassium chloride SA  20 mEq Oral TID   rosuvastatin  20 mg Oral Daily   sodium chloride flush  3 mL Intravenous Q12H   Continuous Infusions:  sodium chloride     PRN Meds:.sodium chloride, acetaminophen, ondansetron (ZOFRAN) IV, mouth rinse, sodium chloride  flush  Assessment/Plan: Acute systolic left heart failure Chronic atrial fibrillation Acute respiratory failure HTN Hypothyroidism Morbid obesity Type 2 DM S/P stroke Moderate MR  Plan: Continue slow diuresis as tolerated R + Left heart catheterization on Monday.   LOS: 2 days   Time spent including chart review, lab review, examination, discussion with patient : 30 min   [name]  MD  11/17/2022, 6:17 PM

## 2022-11-17 NOTE — Progress Notes (Signed)
Echocardiogram 2D Echocardiogram has been performed.  Candice Perez 11/17/2022, 10:05 AM

## 2022-11-18 LAB — BASIC METABOLIC PANEL
Anion gap: 9 (ref 5–15)
BUN: 25 mg/dL — ABNORMAL HIGH (ref 8–23)
CO2: 30 mmol/L (ref 22–32)
Calcium: 9.1 mg/dL (ref 8.9–10.3)
Chloride: 102 mmol/L (ref 98–111)
Creatinine, Ser: 1.35 mg/dL — ABNORMAL HIGH (ref 0.44–1.00)
GFR, Estimated: 41 mL/min — ABNORMAL LOW (ref >60)
Glucose, Bld: 113 mg/dL — ABNORMAL HIGH (ref 70–99)
Potassium: 4.7 mmol/L (ref 3.5–5.1)
Sodium: 141 mmol/L (ref 135–145)

## 2022-11-18 MED ORDER — CARVEDILOL 12.5 MG PO TABS
12.5000 mg | ORAL_TABLET | Freq: Two times a day (BID) | ORAL | Status: DC
  Administered 2022-11-18 – 2022-11-24 (×12): 12.5 mg via ORAL
  Filled 2022-11-18 (×12): qty 1

## 2022-11-18 MED ORDER — FUROSEMIDE 10 MG/ML IJ SOLN
40.0000 mg | Freq: Every day | INTRAMUSCULAR | Status: DC
  Administered 2022-11-19 – 2022-11-23 (×5): 40 mg via INTRAVENOUS
  Filled 2022-11-18 (×6): qty 4

## 2022-11-18 MED ORDER — POTASSIUM CHLORIDE CRYS ER 10 MEQ PO TBCR
10.0000 meq | EXTENDED_RELEASE_TABLET | Freq: Two times a day (BID) | ORAL | Status: DC
  Administered 2022-11-18 – 2022-11-24 (×13): 10 meq via ORAL
  Filled 2022-11-18 (×13): qty 1

## 2022-11-18 NOTE — Progress Notes (Signed)
Ref: Marylen Ponto, MD   Subjective:  Awake. HR 100's. Leg edema slightly improved.  Objective:  Vital Signs in the last 24 hours: Temp:  [97.5 F (36.4 C)-98.2 F (36.8 C)] 97.9 F (36.6 C) (07/06 1000) Pulse Rate:  [59-132] 59 (07/06 1000) Cardiac Rhythm: Atrial fibrillation (07/06 0700) Resp:  [16-18] 18 (07/06 1000) BP: (92-120)/(63-86) 92/66 (07/06 1000) SpO2:  [90 %-94 %] 93 % (07/06 1000) Weight:  [139.2 kg] 139.2 kg (07/06 0454)  Physical Exam: BP Readings from Last 1 Encounters:  11/18/22 92/66     Wt Readings from Last 1 Encounters:  11/18/22 (!) 139.2 kg    Weight change: -1.091 kg Body mass index is 48.07 kg/m. HEENT: [place]/AT, Eyes-Hazel,  Conjunctiva-Pink, Sclera-Non-icteric Neck: No JVD, No bruit, Trachea midline. Lungs:  Clearing, Bilateral. Cardiac:  Irregular rhythm, normal S1 and S2, no S3. II/VI systolic murmur. Abdomen:  Soft, non-tender. BS present. Extremities:  1 + edema present. No cyanosis. No clubbing. CNS: AxOx3, Cranial nerves grossly intact, moves all 4 extremities.  Skin: Warm and dry.   Intake/Output from previous day: 07/05 0701 - 07/06 0700 In: 240 [P.O.:240] Out: 1000 [Urine:1000]    Lab Results: BMET    Component Value Date/Time   NA 141 11/18/2022 0019   NA 139 11/17/2022 0039   NA 141 11/16/2022 0021   NA 142 06/15/2017 0930   K 4.7 11/18/2022 0019   K 4.0 11/17/2022 0039   K 3.2 (L) 11/16/2022 0021   CL 102 11/18/2022 0019   CL 101 11/17/2022 0039   CL 101 11/16/2022 0021   CO2 30 11/18/2022 0019   CO2 29 11/17/2022 0039   CO2 29 11/16/2022 0021   GLUCOSE 113 (H) 11/18/2022 0019   GLUCOSE 109 (H) 11/17/2022 0039   GLUCOSE 102 (H) 11/16/2022 0021   BUN 25 (H) 11/18/2022 0019   BUN 23 11/17/2022 0039   BUN 22 11/16/2022 0021   BUN 19 06/15/2017 0930   CREATININE 1.35 (H) 11/18/2022 0019   CREATININE 1.39 (H) 11/17/2022 0039   CREATININE 1.24 (H) 11/16/2022 0021   CALCIUM 9.1 11/18/2022 0019   CALCIUM 8.7 (L)  11/17/2022 0039   CALCIUM 8.7 (L) 11/16/2022 0021   GFRNONAA 41 (L) 11/18/2022 0019   GFRNONAA 40 (L) 11/17/2022 0039   GFRNONAA 46 (L) 11/16/2022 0021   GFRAA 68 06/15/2017 0930   GFRAA 64 (L) 08/28/2013 0429   GFRAA 77 (L) 08/27/2013 0456   CBC    Component Value Date/Time   WBC 9.1 11/15/2022 1700   RBC 4.07 11/15/2022 1700   HGB 12.0 11/15/2022 1700   HGB 12.9 06/15/2017 0930   HCT 37.9 11/15/2022 1700   HCT 38.6 06/15/2017 0930   PLT 264 11/15/2022 1700   PLT 265 06/15/2017 0930   MCV 93.1 11/15/2022 1700   MCV 86 06/15/2017 0930   MCH 29.5 11/15/2022 1700   MCHC 31.7 11/15/2022 1700   RDW 14.4 11/15/2022 1700   RDW 14.5 06/15/2017 0930   LYMPHSABS 2.2 11/15/2022 1700   MONOABS 0.7 11/15/2022 1700   EOSABS 0.1 11/15/2022 1700   BASOSABS 0.0 11/15/2022 1700   HEPATIC Function Panel Recent Labs    11/15/22 1700  PROT 6.9  ALBUMIN 3.9  AST 19  ALT 21  ALKPHOS 45   HEMOGLOBIN A1C Lab Results  Component Value Date   MPG 131.24 12/03/2020   CARDIAC ENZYMES No results found for: "CKTOTAL", "CKMB", "CKMBINDEX", "TROPONINI" BNP No results for input(s): "PROBNP" in the last  8760 hours. TSH Recent Labs    11/15/22 1700  TSH 5.142*   CHOLESTEROL No results for input(s): "CHOL" in the last 8760 hours.  Scheduled Meds:  apixaban  5 mg Oral BID   carvedilol  12.5 mg Oral BID WC   digoxin  0.0625 mg Oral Daily   diltiazem  60 mg Oral Q6H   [START ON 11/19/2022] furosemide  40 mg Intravenous Daily   gabapentin  400 mg Oral QHS   levothyroxine  112 mcg Oral Q0600   potassium chloride SA  10 mEq Oral BID   rosuvastatin  20 mg Oral Daily   sodium chloride flush  3 mL Intravenous Q12H   Continuous Infusions:  sodium chloride     PRN Meds:.sodium chloride, acetaminophen, ondansetron (ZOFRAN) IV, mouth rinse, sodium chloride flush  Assessment/Plan: Acute systolic left heart failure, HFrEF Chronic atrial fibrillation HTN Hypothyroidism Morbid obesity Type  2 DM S/P stroke Moderate MR  Plan: Continue diuresis. Increase carvedilol dose for HR control. Increase activity   LOS: 3 days   Time spent including chart review, lab review, examination, discussion with patient/Nurse: 30 min   [name]  MD  11/18/2022, 11:54 AM

## 2022-11-18 NOTE — H&P (View-Only) (Signed)
Ref: Marylen Ponto, MD   Subjective:  Awake. HR 100's. Leg edema slightly improved.  Objective:  Vital Signs in the last 24 hours: Temp:  [97.5 F (36.4 C)-98.2 F (36.8 C)] 97.9 F (36.6 C) (07/06 1000) Pulse Rate:  [59-132] 59 (07/06 1000) Cardiac Rhythm: Atrial fibrillation (07/06 0700) Resp:  [16-18] 18 (07/06 1000) BP: (92-120)/(63-86) 92/66 (07/06 1000) SpO2:  [90 %-94 %] 93 % (07/06 1000) Weight:  [139.2 kg] 139.2 kg (07/06 0454)  Physical Exam: BP Readings from Last 1 Encounters:  11/18/22 92/66     Wt Readings from Last 1 Encounters:  11/18/22 (!) 139.2 kg    Weight change: -1.091 kg Body mass index is 48.07 kg/m. HEENT: [place]/AT, Eyes-Hazel,  Conjunctiva-Pink, Sclera-Non-icteric Neck: No JVD, No bruit, Trachea midline. Lungs:  Clearing, Bilateral. Cardiac:  Irregular rhythm, normal S1 and S2, no S3. II/VI systolic murmur. Abdomen:  Soft, non-tender. BS present. Extremities:  1 + edema present. No cyanosis. No clubbing. CNS: AxOx3, Cranial nerves grossly intact, moves all 4 extremities.  Skin: Warm and dry.   Intake/Output from previous day: 07/05 0701 - 07/06 0700 In: 240 [P.O.:240] Out: 1000 [Urine:1000]    Lab Results: BMET    Component Value Date/Time   NA 141 11/18/2022 0019   NA 139 11/17/2022 0039   NA 141 11/16/2022 0021   NA 142 06/15/2017 0930   K 4.7 11/18/2022 0019   K 4.0 11/17/2022 0039   K 3.2 (L) 11/16/2022 0021   CL 102 11/18/2022 0019   CL 101 11/17/2022 0039   CL 101 11/16/2022 0021   CO2 30 11/18/2022 0019   CO2 29 11/17/2022 0039   CO2 29 11/16/2022 0021   GLUCOSE 113 (H) 11/18/2022 0019   GLUCOSE 109 (H) 11/17/2022 0039   GLUCOSE 102 (H) 11/16/2022 0021   BUN 25 (H) 11/18/2022 0019   BUN 23 11/17/2022 0039   BUN 22 11/16/2022 0021   BUN 19 06/15/2017 0930   CREATININE 1.35 (H) 11/18/2022 0019   CREATININE 1.39 (H) 11/17/2022 0039   CREATININE 1.24 (H) 11/16/2022 0021   CALCIUM 9.1 11/18/2022 0019   CALCIUM 8.7 (L)  11/17/2022 0039   CALCIUM 8.7 (L) 11/16/2022 0021   GFRNONAA 41 (L) 11/18/2022 0019   GFRNONAA 40 (L) 11/17/2022 0039   GFRNONAA 46 (L) 11/16/2022 0021   GFRAA 68 06/15/2017 0930   GFRAA 64 (L) 08/28/2013 0429   GFRAA 77 (L) 08/27/2013 0456   CBC    Component Value Date/Time   WBC 9.1 11/15/2022 1700   RBC 4.07 11/15/2022 1700   HGB 12.0 11/15/2022 1700   HGB 12.9 06/15/2017 0930   HCT 37.9 11/15/2022 1700   HCT 38.6 06/15/2017 0930   PLT 264 11/15/2022 1700   PLT 265 06/15/2017 0930   MCV 93.1 11/15/2022 1700   MCV 86 06/15/2017 0930   MCH 29.5 11/15/2022 1700   MCHC 31.7 11/15/2022 1700   RDW 14.4 11/15/2022 1700   RDW 14.5 06/15/2017 0930   LYMPHSABS 2.2 11/15/2022 1700   MONOABS 0.7 11/15/2022 1700   EOSABS 0.1 11/15/2022 1700   BASOSABS 0.0 11/15/2022 1700   HEPATIC Function Panel Recent Labs    11/15/22 1700  PROT 6.9  ALBUMIN 3.9  AST 19  ALT 21  ALKPHOS 45   HEMOGLOBIN A1C Lab Results  Component Value Date   MPG 131.24 12/03/2020   CARDIAC ENZYMES No results found for: "CKTOTAL", "CKMB", "CKMBINDEX", "TROPONINI" BNP No results for input(s): "PROBNP" in the last  8760 hours. TSH Recent Labs    11/15/22 1700  TSH 5.142*   CHOLESTEROL No results for input(s): "CHOL" in the last 8760 hours.  Scheduled Meds:  apixaban  5 mg Oral BID   carvedilol  12.5 mg Oral BID WC   digoxin  0.0625 mg Oral Daily   diltiazem  60 mg Oral Q6H   [START ON 11/19/2022] furosemide  40 mg Intravenous Daily   gabapentin  400 mg Oral QHS   levothyroxine  112 mcg Oral Q0600   potassium chloride SA  10 mEq Oral BID   rosuvastatin  20 mg Oral Daily   sodium chloride flush  3 mL Intravenous Q12H   Continuous Infusions:  sodium chloride     PRN Meds:.sodium chloride, acetaminophen, ondansetron (ZOFRAN) IV, mouth rinse, sodium chloride flush  Assessment/Plan: Acute systolic left heart failure, HFrEF Chronic atrial fibrillation HTN Hypothyroidism Morbid obesity Type  2 DM S/P stroke Moderate MR  Plan: Continue diuresis. Increase carvedilol dose for HR control. Increase activity   LOS: 3 days   Time spent including chart review, lab review, examination, discussion with patient/Nurse: 30 min   [name]  MD  11/18/2022, 11:54 AM

## 2022-11-19 LAB — BASIC METABOLIC PANEL
Anion gap: 9 (ref 5–15)
BUN: 26 mg/dL — ABNORMAL HIGH (ref 8–23)
CO2: 28 mmol/L (ref 22–32)
Calcium: 8.9 mg/dL (ref 8.9–10.3)
Chloride: 103 mmol/L (ref 98–111)
Creatinine, Ser: 1.19 mg/dL — ABNORMAL HIGH (ref 0.44–1.00)
GFR, Estimated: 48 mL/min — ABNORMAL LOW (ref >60)
Glucose, Bld: 127 mg/dL — ABNORMAL HIGH (ref 70–99)
Potassium: 4.5 mmol/L (ref 3.5–5.1)
Sodium: 140 mmol/L (ref 135–145)

## 2022-11-19 LAB — DIGOXIN LEVEL: Digoxin Level: 0.5 ng/mL — ABNORMAL LOW (ref 0.8–2.0)

## 2022-11-19 LAB — MAGNESIUM: Magnesium: 2.5 mg/dL — ABNORMAL HIGH (ref 1.7–2.4)

## 2022-11-19 MED ORDER — SODIUM CHLORIDE 0.9 % IV SOLN: INTRAVENOUS | Status: DC

## 2022-11-19 MED ORDER — SODIUM CHLORIDE 0.9 % IV SOLN: 250.0000 mL | INTRAVENOUS | Status: DC | PRN

## 2022-11-19 MED ORDER — ASPIRIN 81 MG PO CHEW
81.0000 mg | CHEWABLE_TABLET | ORAL | Status: AC
  Administered 2022-11-20: 81 mg via ORAL
  Filled 2022-11-19: qty 1

## 2022-11-19 MED ORDER — SODIUM CHLORIDE 0.9% FLUSH: 3.0000 mL | INTRAVENOUS | Status: DC | PRN

## 2022-11-19 MED ORDER — SODIUM CHLORIDE 0.9% FLUSH
3.0000 mL | Freq: Two times a day (BID) | INTRAVENOUS | Status: DC
  Administered 2022-11-19 – 2022-11-23 (×7): 3 mL via INTRAVENOUS

## 2022-11-19 NOTE — Plan of Care (Signed)
  Problem: Education: Goal: Knowledge of General Education information will improve Description Including pain rating scale, medication(s)/side effects and non-pharmacologic comfort measures Outcome: Progressing   

## 2022-11-20 ENCOUNTER — Encounter (HOSPITAL_COMMUNITY)
Admission: AD | Disposition: A | Discharge status: Home Health | Payer: Self-pay | Source: Home / Self Care | Attending: Cardiovascular Disease

## 2022-11-20 HISTORY — PX: RIGHT/LEFT HEART CATH AND CORONARY ANGIOGRAPHY: CATH118266

## 2022-11-20 LAB — POCT I-STAT EG7
Acid-Base Excess: 6 mmol/L — ABNORMAL HIGH (ref 0.0–2.0)
Bicarbonate: 31.6 mmol/L — ABNORMAL HIGH (ref 20.0–28.0)
Calcium, Ion: 1.2 mmol/L (ref 1.15–1.40)
HCT: 35 % — ABNORMAL LOW (ref 36.0–46.0)
Hemoglobin: 11.9 g/dL — ABNORMAL LOW (ref 12.0–15.0)
O2 Saturation: 50 %
Potassium: 4.3 mmol/L (ref 3.5–5.1)
Sodium: 140 mmol/L (ref 135–145)
TCO2: 33 mmol/L — ABNORMAL HIGH (ref 22–32)
pCO2, Ven: 49 mmHg (ref 44–60)
pH, Ven: 7.418 (ref 7.25–7.43)
pO2, Ven: 27 mmHg — CL (ref 32–45)

## 2022-11-20 LAB — CBC
HCT: 33.5 % — ABNORMAL LOW (ref 36.0–46.0)
Hemoglobin: 10.4 g/dL — ABNORMAL LOW (ref 12.0–15.0)
MCH: 28.4 pg (ref 26.0–34.0)
MCHC: 31 g/dL (ref 30.0–36.0)
MCV: 91.5 fL (ref 80.0–100.0)
Platelets: 224 10*3/uL (ref 150–400)
RBC: 3.66 MIL/uL — ABNORMAL LOW (ref 3.87–5.11)
RDW: 14.1 % (ref 11.5–15.5)
WBC: 9.3 10*3/uL (ref 4.0–10.5)
nRBC: 0 % (ref 0.0–0.2)

## 2022-11-20 LAB — BASIC METABOLIC PANEL
Anion gap: 9 (ref 5–15)
BUN: 32 mg/dL — ABNORMAL HIGH (ref 8–23)
CO2: 27 mmol/L (ref 22–32)
Calcium: 8.8 mg/dL — ABNORMAL LOW (ref 8.9–10.3)
Chloride: 101 mmol/L (ref 98–111)
Creatinine, Ser: 1.25 mg/dL — ABNORMAL HIGH (ref 0.44–1.00)
GFR, Estimated: 45 mL/min — ABNORMAL LOW (ref >60)
Glucose, Bld: 117 mg/dL — ABNORMAL HIGH (ref 70–99)
Potassium: 4.2 mmol/L (ref 3.5–5.1)
Sodium: 137 mmol/L (ref 135–145)

## 2022-11-20 LAB — POCT I-STAT 7, (LYTES, BLD GAS, ICA,H+H)
Acid-Base Excess: 6 mmol/L — ABNORMAL HIGH (ref 0.0–2.0)
Bicarbonate: 30.9 mmol/L — ABNORMAL HIGH (ref 20.0–28.0)
Calcium, Ion: 1.19 mmol/L (ref 1.15–1.40)
HCT: 35 % — ABNORMAL LOW (ref 36.0–46.0)
Hemoglobin: 11.9 g/dL — ABNORMAL LOW (ref 12.0–15.0)
O2 Saturation: 87 %
Potassium: 4.4 mmol/L (ref 3.5–5.1)
Sodium: 139 mmol/L (ref 135–145)
TCO2: 32 mmol/L (ref 22–32)
pCO2 arterial: 43.9 mmHg (ref 32–48)
pH, Arterial: 7.455 — ABNORMAL HIGH (ref 7.35–7.45)
pO2, Arterial: 50 mmHg — ABNORMAL LOW (ref 83–108)

## 2022-11-20 LAB — MAGNESIUM: Magnesium: 2.3 mg/dL (ref 1.7–2.4)

## 2022-11-20 SURGERY — RIGHT/LEFT HEART CATH AND CORONARY ANGIOGRAPHY
Anesthesia: LOCAL

## 2022-11-20 MED ORDER — SODIUM CHLORIDE 0.9% FLUSH: 3.0000 mL | INTRAVENOUS | Status: DC | PRN

## 2022-11-20 MED ORDER — HEPARIN (PORCINE) IN NACL 1000-0.9 UT/500ML-% IV SOLN
INTRAVENOUS | Status: DC | PRN
  Administered 2022-11-20 (×2): 500 mL

## 2022-11-20 MED ORDER — SODIUM CHLORIDE 0.9% FLUSH
3.0000 mL | Freq: Two times a day (BID) | INTRAVENOUS | Status: DC
  Administered 2022-11-20 – 2022-11-24 (×7): 3 mL via INTRAVENOUS

## 2022-11-20 MED ORDER — MIDAZOLAM HCL 2 MG/2ML IJ SOLN
INTRAMUSCULAR | Status: DC | PRN
  Administered 2022-11-20: .5 mg via INTRAVENOUS

## 2022-11-20 MED ORDER — SODIUM CHLORIDE 0.9 % IV SOLN: 250.0000 mL | INTRAVENOUS | Status: DC | PRN

## 2022-11-20 MED ORDER — LIDOCAINE HCL (PF) 1 % IJ SOLN
INTRAMUSCULAR | Status: AC
  Filled 2022-11-20: qty 30

## 2022-11-20 MED ORDER — LABETALOL HCL 5 MG/ML IV SOLN
INTRAVENOUS | Status: DC | PRN
  Administered 2022-11-20: 10 mg via INTRAVENOUS

## 2022-11-20 MED ORDER — LIDOCAINE HCL (PF) 1 % IJ SOLN
INTRAMUSCULAR | Status: DC | PRN
  Administered 2022-11-20: 10 mL

## 2022-11-20 MED ORDER — FENTANYL CITRATE (PF) 100 MCG/2ML IJ SOLN
INTRAMUSCULAR | Status: DC | PRN
  Administered 2022-11-20: 12.5 ug via INTRAVENOUS

## 2022-11-20 MED ORDER — FENTANYL CITRATE (PF) 100 MCG/2ML IJ SOLN
INTRAMUSCULAR | Status: AC
  Filled 2022-11-20: qty 2

## 2022-11-20 MED ORDER — MIDAZOLAM HCL 2 MG/2ML IJ SOLN
INTRAMUSCULAR | Status: AC
  Filled 2022-11-20: qty 2

## 2022-11-20 MED ORDER — CHLORHEXIDINE GLUCONATE CLOTH 2 % EX PADS
6.0000 | MEDICATED_PAD | Freq: Once | CUTANEOUS | Status: AC
  Administered 2022-11-20: 6 via TOPICAL

## 2022-11-20 MED ORDER — APIXABAN 5 MG PO TABS
5.0000 mg | ORAL_TABLET | Freq: Two times a day (BID) | ORAL | Status: DC
  Administered 2022-11-20 – 2022-11-24 (×8): 5 mg via ORAL
  Filled 2022-11-20 (×8): qty 1

## 2022-11-20 MED ORDER — IOHEXOL 350 MG/ML SOLN
INTRAVENOUS | Status: DC | PRN
  Administered 2022-11-20: 60 mL via INTRA_ARTERIAL

## 2022-11-20 MED ORDER — LABETALOL HCL 5 MG/ML IV SOLN
INTRAVENOUS | Status: AC
  Filled 2022-11-20: qty 4

## 2022-11-20 MED ORDER — SODIUM CHLORIDE 0.9 % IV SOLN
INTRAVENOUS | Status: AC | PRN
  Administered 2022-11-20: 10 mL/h via INTRAVENOUS

## 2022-11-20 SURGICAL SUPPLY — 9 items
CATH INFINITI 5FR MULTPACK ANG (CATHETERS) IMPLANT
CATH SWAN GANZ 7F STRAIGHT (CATHETERS) IMPLANT
KIT HEART LEFT (KITS) ×1 IMPLANT
KIT MICROPUNCTURE NIT STIFF (SHEATH) IMPLANT
PACK CARDIAC CATHETERIZATION (CUSTOM PROCEDURE TRAY) ×1 IMPLANT
SHEATH PINNACLE 5F 10CM (SHEATH) IMPLANT
SHEATH PINNACLE 7F 10CM (SHEATH) IMPLANT
TRANSDUCER W/STOPCOCK (MISCELLANEOUS) ×1 IMPLANT
WIRE EMERALD 3MM-J .035X150CM (WIRE) IMPLANT

## 2022-11-20 NOTE — Progress Notes (Addendum)
Site Area: right femoral (arterial)  Site prior to removal: stable, level 0  Pressure applied for: 20 minutes   Bedrest beginning at: 1415  Manual: yes   Patient status during pull: stable   Post pull groin site: level 0   Post pull instructions given: yes   Post pull pulses present: +2 DP on L and +2 DP on R  Dressing applied: yes   Comments:

## 2022-11-20 NOTE — Progress Notes (Signed)
Heart Failure Navigator Progress Note  Assessed for Heart & Vascular TOC clinic readiness.  Patient EF 30-35%, Per Dr. Algie Coffer he will do follow up appointment at his office. .   Navigator will sign off at this time.    Rhae Hammock, BSN, Scientist, clinical (histocompatibility and immunogenetics) Only

## 2022-11-20 NOTE — Progress Notes (Signed)
Site Area: right femoral (venous)  Site prior to removal: stable, level 0   Pressure applied for: 10 minutes   Bedrest beginning at: 1415  Manual: yes   Patient status during pull: stable   Post pull groin site: level 0   Post pull instructions given: yes   Post pull pulses present: +2 DP on L and +2 DP on R  Dressing applied: yes   Comments:

## 2022-11-20 NOTE — Interval H&P Note (Signed)
History and Physical Interval Note:  11/20/2022 11:39 AM  Candice Perez  has presented today for surgery, with the diagnosis of chf.  The various methods of treatment have been discussed with the patient and family. After consideration of risks, benefits and other options for treatment, the patient has consented to  Procedure(s): RIGHT/LEFT HEART CATH AND CORONARY ANGIOGRAPHY (N/A) as a surgical intervention.  The patient's history has been reviewed, patient examined, no change in status, stable for surgery.  I have reviewed the patient's chart and labs.  Questions were answered to the patient's satisfaction.     Ricki Rodriguez

## 2022-11-20 NOTE — Care Management Important Message (Signed)
Important Message  Patient Details  Name: Candice Perez MRN: [id number] Date of Birth: 1951-04-12   Medicare Important Message Given:  Yes     Renie Ora 11/20/2022, 8:24 AM

## 2022-11-21 ENCOUNTER — Encounter (HOSPITAL_COMMUNITY): Payer: Self-pay | Admitting: Cardiovascular Disease

## 2022-11-21 LAB — BASIC METABOLIC PANEL
Anion gap: 10 (ref 5–15)
BUN: 29 mg/dL — ABNORMAL HIGH (ref 8–23)
CO2: 26 mmol/L (ref 22–32)
Calcium: 8.6 mg/dL — ABNORMAL LOW (ref 8.9–10.3)
Chloride: 99 mmol/L (ref 98–111)
Creatinine, Ser: 1.18 mg/dL — ABNORMAL HIGH (ref 0.44–1.00)
GFR, Estimated: 48 mL/min — ABNORMAL LOW (ref >60)
Glucose, Bld: 134 mg/dL — ABNORMAL HIGH (ref 70–99)
Potassium: 4.3 mmol/L (ref 3.5–5.1)
Sodium: 135 mmol/L (ref 135–145)

## 2022-11-21 LAB — MAGNESIUM: Magnesium: 2.4 mg/dL (ref 1.7–2.4)

## 2022-11-21 MED ORDER — ALLOPURINOL 100 MG PO TABS
100.0000 mg | ORAL_TABLET | Freq: Every day | ORAL | Status: DC
  Administered 2022-11-21 – 2022-11-24 (×4): 100 mg via ORAL
  Filled 2022-11-21 (×4): qty 1

## 2022-11-21 MED ORDER — LEVOTHYROXINE SODIUM 25 MCG PO TABS
125.0000 ug | ORAL_TABLET | Freq: Every day | ORAL | Status: DC
  Administered 2022-11-22 – 2022-11-24 (×3): 125 ug via ORAL
  Filled 2022-11-21 (×3): qty 1

## 2022-11-21 MED ORDER — COLCHICINE 0.6 MG PO TABS
0.6000 mg | ORAL_TABLET | Freq: Every day | ORAL | Status: DC
  Administered 2022-11-21 – 2022-11-23 (×3): 0.6 mg via ORAL
  Filled 2022-11-21 (×4): qty 1

## 2022-11-21 NOTE — Progress Notes (Signed)
Ref: Candice Ponto, MD   Subjective:  Feeling stable. Patient aware of Mild CAD. Discussed diet, activity and medications with patient personally and sister(by phone). Heart failure booklet given by nurse. Left hand swelling, probably from gout made worse by diuretics. Will check uric acid level and give colchicine and allopurinol. Will check UA per patient request.  Objective:  Vital Signs in the last 24 hours: Temp:  [97.6 F (36.4 C)-99.2 F (37.3 C)] 97.6 F (36.4 C) (07/09 1126) Pulse Rate:  [52-167] 61 (07/09 1600) Cardiac Rhythm: Atrial fibrillation (07/09 0700) Resp:  [18-20] 18 (07/09 1600) BP: (94-111)/(58-79) 98/62 (07/09 1600) SpO2:  [93 %-100 %] 98 % (07/09 1600) Weight:  [409 kg] 138 kg (07/09 0400)  Physical Exam: BP Readings from Last 1 Encounters:  11/21/22 98/62     Wt Readings from Last 1 Encounters:  11/21/22 (!) 138 kg    Weight change: -2.2 kg Body mass index is 47.65 kg/m. HEENT: [place]/AT, Eyes-Hazel, Conjunctiva-Pink, Sclera-Non-icteric Neck: No JVD, No bruit, Trachea midline. Lungs:  Clearing, Bilateral. Cardiac:  Irregular rhythm, normal S1 and S2, no S3. II/VI systolic murmur. Abdomen:  Soft, non-tender. BS present. Extremities:  Trace edema present. No cyanosis. No clubbing.Left hand and wrist swelling at joints. CNS: AxOx3, Cranial nerves grossly intact, moves all 4 extremities.  Skin: Warm and dry.   Intake/Output from previous day: 07/08 0701 - 07/09 0700 In: 422.2 [P.O.:350; I.V.:72.2] Out: 1900 [Urine:1900]    Lab Results: BMET    Component Value Date/Time   NA 135 11/21/2022 0047   NA 139 11/20/2022 1244   NA 140 11/20/2022 1239   NA 142 06/15/2017 0930   K 4.3 11/21/2022 0047   K 4.4 11/20/2022 1244   K 4.3 11/20/2022 1239   CL 99 11/21/2022 0047   CL 101 11/20/2022 0038   CL 103 11/19/2022 0031   CO2 26 11/21/2022 0047   CO2 27 11/20/2022 0038   CO2 28 11/19/2022 0031   GLUCOSE 134 (H) 11/21/2022 0047   GLUCOSE 117  (H) 11/20/2022 0038   GLUCOSE 127 (H) 11/19/2022 0031   BUN 29 (H) 11/21/2022 0047   BUN 32 (H) 11/20/2022 0038   BUN 26 (H) 11/19/2022 0031   BUN 19 06/15/2017 0930   CREATININE 1.18 (H) 11/21/2022 0047   CREATININE 1.25 (H) 11/20/2022 0038   CREATININE 1.19 (H) 11/19/2022 0031   CALCIUM 8.6 (L) 11/21/2022 0047   CALCIUM 8.8 (L) 11/20/2022 0038   CALCIUM 8.9 11/19/2022 0031   GFRNONAA 48 (L) 11/21/2022 0047   GFRNONAA 45 (L) 11/20/2022 0038   GFRNONAA 48 (L) 11/19/2022 0031   GFRAA 68 06/15/2017 0930   GFRAA 64 (L) 08/28/2013 0429   GFRAA 77 (L) 08/27/2013 0456   CBC    Component Value Date/Time   WBC 9.3 11/20/2022 0038   RBC 3.66 (L) 11/20/2022 0038   HGB 11.9 (L) 11/20/2022 1244   HGB 12.9 06/15/2017 0930   HCT 35.0 (L) 11/20/2022 1244   HCT 38.6 06/15/2017 0930   PLT 224 11/20/2022 0038   PLT 265 06/15/2017 0930   MCV 91.5 11/20/2022 0038   MCV 86 06/15/2017 0930   MCH 28.4 11/20/2022 0038   MCHC 31.0 11/20/2022 0038   RDW 14.1 11/20/2022 0038   RDW 14.5 06/15/2017 0930   LYMPHSABS 2.2 11/15/2022 1700   MONOABS 0.7 11/15/2022 1700   EOSABS 0.1 11/15/2022 1700   BASOSABS 0.0 11/15/2022 1700   HEPATIC Function Panel Recent Labs    11/15/22  1700  PROT 6.9  ALBUMIN 3.9  AST 19  ALT 21  ALKPHOS 45   HEMOGLOBIN A1C Lab Results  Component Value Date   MPG 131.24 12/03/2020   CARDIAC ENZYMES No results found for: "CKTOTAL", "CKMB", "CKMBINDEX", "TROPONINI" BNP No results for input(s): "PROBNP" in the last 8760 hours. TSH Recent Labs    11/15/22 1700  TSH 5.142*   CHOLESTEROL No results for input(s): "CHOL" in the last 8760 hours.  Scheduled Meds:  allopurinol  100 mg Oral Daily   apixaban  5 mg Oral BID   carvedilol  12.5 mg Oral BID WC   colchicine  0.6 mg Oral Daily   digoxin  0.0625 mg Oral Daily   diltiazem  60 mg Oral Q6H   furosemide  40 mg Intravenous Daily   gabapentin  400 mg Oral QHS   levothyroxine  112 mcg Oral Q0600   potassium  chloride SA  10 mEq Oral BID   rosuvastatin  20 mg Oral Daily   sodium chloride flush  3 mL Intravenous Q12H   sodium chloride flush  3 mL Intravenous Q12H   sodium chloride flush  3 mL Intravenous Q12H   Continuous Infusions:  sodium chloride     sodium chloride     PRN Meds:.sodium chloride, sodium chloride, acetaminophen, ondansetron (ZOFRAN) IV, mouth rinse, sodium chloride flush, sodium chloride flush  Assessment/Plan: Acute systolic left heart failure, HFrEF Chronic atrial fibrillation HTN Hypothyroidism Morbid obesity Type 2 DM S/P stroke Moderate MR Left wrist swelling r/o gout  Plan: Follow heart failure booklet instructions. Check uric acid level. Check UA.    LOS: 6 days   Time spent including chart review, lab review, examination, discussion with patient : 30 min   [name]  MD  11/21/2022, 5:12 PM

## 2022-11-21 NOTE — TOC Initial Note (Signed)
Transition of Care [facility]) - Initial/Assessment Note    Patient Details  Name: Candice Perez MRN: [id number] Date of Birth: 1951-03-12  Transition of Care [facility]) CM/SW Contact:    Leone Haven, RN Phone Number: 11/21/2022, 5:02 PM  Clinical Narrative:                 From home with spouse, indep, PCP is Dr. Leonor Liv. She has insurance on file.  She currently has no HH in place . She has 3 n 1, w/chair, rollator, cane at home, spouse will transport her home at dc.  He is her support system. She gets her medications from [facility] in [place].  Pta she ambulates indep with cane.   Expected Discharge Plan: Home/Self Care Barriers to Discharge: Continued Medical Work up   Patient Goals and CMS Choice Patient states their goals for this hospitalization and ongoing recovery are:: return home   Choice offered to / list presented to : NA      Expected Discharge Plan and Services In-house Referral: NA Discharge Planning Services: CM Consult Post Acute Care Choice: NA Living arrangements for the past 2 months: Single Family Home                 DME Arranged: N/A DME Agency: NA       HH Arranged: NA          Prior Living Arrangements/Services Living arrangements for the past 2 months: Single Family Home Lives with:: Spouse Patient language and need for interpreter reviewed:: Yes Do you feel safe going back to the place where you live?: Yes      Need for Family Participation in Patient Care: Yes (Comment) Care giver support system in place?: Yes (comment) Current home services: DME (3 n 1, w/chair, cane, rollator) Criminal Activity/Legal Involvement Pertinent to Current Situation/Hospitalization: No - Comment as needed  Activities of Daily Living Home Assistive Devices/Equipment: Eyeglasses, Cane (specify quad or straight) (quad) ADL Screening (condition at time of admission) Patient's cognitive ability adequate to safely complete daily activities?: Yes Is the patient  deaf or have difficulty hearing?: No Does the patient have difficulty seeing, even when wearing glasses/contacts?: No Does the patient have difficulty concentrating, remembering, or making decisions?: No Patient able to express need for assistance with ADLs?: Yes Does the patient have difficulty dressing or bathing?: No Independently performs ADLs?: Yes (appropriate for developmental age) Does the patient have difficulty walking or climbing stairs?: Yes Weakness of Legs: None Weakness of Arms/Hands: None  Permission Sought/Granted                  Emotional Assessment Appearance:: Appears stated age Attitude/Demeanor/Rapport: Engaged Affect (typically observed): Appropriate Orientation: : Oriented to Self, Oriented to Place, Oriented to  Time, Oriented to Situation Alcohol / Substance Use: Not Applicable Psych Involvement: No (comment)  Admission diagnosis:  CHF (congestive heart failure) (HCC) [I50.9] Patient Active Problem List   Diagnosis Date Noted   CHF (congestive heart failure) (HCC) 11/15/2022   Symptomatic bradycardia 08/17/2021   Dyspnea 08/17/2021   AKI (acute kidney injury) (HCC) 08/17/2021   Paroxysmal A-fib (HCC) 08/17/2021   History of stroke 08/17/2021   HLD (hyperlipidemia) 08/17/2021   Hypothyroidism 08/17/2021   Chronic systolic CHF (congestive heart failure) (HCC) 08/17/2021   Cerebrovascular accident (CVA) due to embolism of left middle cerebral artery (HCC) 03/29/2021   Right leg pain 03/29/2021   Snoring 03/29/2021   Right hemiparesis (HCC)    Essential hypertension  Sinus tachycardia    Acute lower UTI    Left middle cerebral artery stroke (HCC) 12/09/2020   Right hemiplegia (HCC) 12/09/2020   Acute respiratory failure with hypoxia (HCC)    Cerebrovascular accident (CVA) (HCC) 12/02/2020   Middle cerebral artery embolism, left 12/02/2020   TIA (transient ischemic attack) 06/15/2017   Morbid obesity (HCC) 08/27/2013   Expected blood loss  anemia 08/27/2013   S/P left TKA 08/26/2013   Deep vein thrombosis of left lower extremity (HCC) 01/03/2013   Varicose veins of lower extremities with other complications 05/01/2011   Chronic venous insufficiency 12/09/2010   PCP:  Marylen Ponto, MD Pharmacy:   [facility] [address], [place] - [address] [facility] [place] [address] Phone: [phone] Fax: [phone]  [facility] - [place], [place] - [address] [place] [address] Phone: [phone] Fax: [phone]     Social Determinants of Health (SDOH) Social History: SDOH Screenings   Food Insecurity: No Food Insecurity (11/15/2022)  Housing: Low Risk  (11/15/2022)  Transportation Needs: No Transportation Needs (11/15/2022)  Utilities: Not At Risk (11/15/2022)  Depression (PHQ2-9): Low Risk  (09/26/2022)  Tobacco Use: Medium Risk (11/21/2022)   SDOH Interventions:     Readmission Risk Interventions     No data to display

## 2022-11-22 LAB — URINALYSIS, ROUTINE W REFLEX MICROSCOPIC
Bilirubin Urine: NEGATIVE
Glucose, UA: NEGATIVE mg/dL
Ketones, ur: NEGATIVE mg/dL
Nitrite: NEGATIVE
Protein, ur: 30 mg/dL — AB
Specific Gravity, Urine: 1.017 (ref 1.005–1.030)
WBC, UA: >50 WBC/hpf (ref 0–5)
pH: 5 (ref 5.0–8.0)

## 2022-11-22 LAB — COMPREHENSIVE METABOLIC PANEL
ALT: 33 U/L (ref 0–44)
AST: 28 U/L (ref 15–41)
Albumin: 2.8 g/dL — ABNORMAL LOW (ref 3.5–5.0)
Alkaline Phosphatase: 50 U/L (ref 38–126)
Anion gap: 12 (ref 5–15)
BUN: 34 mg/dL — ABNORMAL HIGH (ref 8–23)
CO2: 28 mmol/L (ref 22–32)
Calcium: 8.9 mg/dL (ref 8.9–10.3)
Chloride: 100 mmol/L (ref 98–111)
Creatinine, Ser: 1.26 mg/dL — ABNORMAL HIGH (ref 0.44–1.00)
GFR, Estimated: 45 mL/min — ABNORMAL LOW (ref >60)
Glucose, Bld: 119 mg/dL — ABNORMAL HIGH (ref 70–99)
Potassium: 4.4 mmol/L (ref 3.5–5.1)
Sodium: 140 mmol/L (ref 135–145)
Total Bilirubin: 0.6 mg/dL (ref 0.3–1.2)
Total Protein: 6.2 g/dL — ABNORMAL LOW (ref 6.5–8.1)

## 2022-11-22 LAB — URIC ACID: Uric Acid, Serum: 9.2 mg/dL — ABNORMAL HIGH (ref 2.5–7.1)

## 2022-11-22 MED ORDER — CIPROFLOXACIN HCL 250 MG PO TABS
250.0000 mg | ORAL_TABLET | Freq: Two times a day (BID) | ORAL | Status: DC
  Administered 2022-11-22 – 2022-11-24 (×5): 250 mg via ORAL
  Filled 2022-11-22 (×5): qty 1

## 2022-11-22 MED ORDER — ALBUMIN HUMAN 25 % IV SOLN
12.5000 g | Freq: Once | INTRAVENOUS | Status: AC
  Administered 2022-11-22: 12.5 g via INTRAVENOUS
  Filled 2022-11-22: qty 50

## 2022-11-22 NOTE — Progress Notes (Signed)
Mobility Specialist Progress Note:   11/22/22 1140  Mobility  Activity Transferred from bed to chair  Level of Assistance Moderate assist, patient does 50-74%  Assistive Device Front wheel walker  Distance Ambulated (ft) 3 ft  Activity Response Tolerated fair  Mobility Referral Yes  $Mobility charge 1 Mobility  Mobility Specialist Start Time (ACUTE ONLY) 1140  Mobility Specialist Stop Time (ACUTE ONLY) 1200  Mobility Specialist Time Calculation (min) (ACUTE ONLY) 20 min   Pre Mobility: BP 96/62 (71)  Pt agreeable to mobility session. Required minA to get EOB, heavy modA to stand and to take pivotal steps to chair. Pt with flexed posture throughout transfer, despite verbal cues. C/o generalized pain throughout. Pt left in chair with all needs met. Encouraged frequent OOB mobility.  Addison Lank Mobility Specialist Please contact via SecureChat or  Rehab office at [phone]

## 2022-11-22 NOTE — Progress Notes (Signed)
Ref: Marylen Ponto, MD   Subjective:  Left hand swelling stable. UA positive for UTI.  Objective:  Vital Signs in the last 24 hours: Temp:  [97.6 F (36.4 C)-98.1 F (36.7 C)] 97.6 F (36.4 C) (07/10 1108) Pulse Rate:  [39-110] 77 (07/10 1108) Cardiac Rhythm: Atrial fibrillation (07/10 0700) Resp:  [18-19] 19 (07/10 1108) BP: (92-115)/(54-85) 92/54 (07/10 1108) SpO2:  [95 %-98 %] 98 % (07/10 1108) Weight:  [135.1 kg] 135.1 kg (07/10 0540)  Physical Exam: BP Readings from Last 1 Encounters:  11/22/22 (!) 92/54     Wt Readings from Last 1 Encounters:  11/22/22 135.1 kg    Weight change: -2.9 kg Body mass index is 46.65 kg/m. HEENT: [place]/AT, Eyes-Hazel, Conjunctiva-Pink, Sclera-Non-icteric Neck: No JVD, No bruit, Trachea midline. Lungs:  Clearing, Bilateral. Cardiac:  Irregular rhythm, normal S1 and S2, no S3. II/VI systolic murmur. Abdomen:  Soft, non-tender. BS present. Extremities:  No edema present. No cyanosis. No clubbing. CNS: AxOx3, Cranial nerves grossly intact, moves all 4 extremities.  Skin: Warm and dry.   Intake/Output from previous day: 07/09 0701 - 07/10 0700 In: 637 [P.O.:637] Out: 2050 [Urine:2050]    Lab Results: BMET    Component Value Date/Time   NA 140 11/22/2022 0134   NA 135 11/21/2022 0047   NA 139 11/20/2022 1244   NA 142 06/15/2017 0930   K 4.4 11/22/2022 0134   K 4.3 11/21/2022 0047   K 4.4 11/20/2022 1244   CL 100 11/22/2022 0134   CL 99 11/21/2022 0047   CL 101 11/20/2022 0038   CO2 28 11/22/2022 0134   CO2 26 11/21/2022 0047   CO2 27 11/20/2022 0038   GLUCOSE 119 (H) 11/22/2022 0134   GLUCOSE 134 (H) 11/21/2022 0047   GLUCOSE 117 (H) 11/20/2022 0038   BUN 34 (H) 11/22/2022 0134   BUN 29 (H) 11/21/2022 0047   BUN 32 (H) 11/20/2022 0038   BUN 19 06/15/2017 0930   CREATININE 1.26 (H) 11/22/2022 0134   CREATININE 1.18 (H) 11/21/2022 0047   CREATININE 1.25 (H) 11/20/2022 0038   CALCIUM 8.9 11/22/2022 0134   CALCIUM 8.6 (L)  11/21/2022 0047   CALCIUM 8.8 (L) 11/20/2022 0038   GFRNONAA 45 (L) 11/22/2022 0134   GFRNONAA 48 (L) 11/21/2022 0047   GFRNONAA 45 (L) 11/20/2022 0038   GFRAA 68 06/15/2017 0930   GFRAA 64 (L) 08/28/2013 0429   GFRAA 77 (L) 08/27/2013 0456   CBC    Component Value Date/Time   WBC 9.3 11/20/2022 0038   RBC 3.66 (L) 11/20/2022 0038   HGB 11.9 (L) 11/20/2022 1244   HGB 12.9 06/15/2017 0930   HCT 35.0 (L) 11/20/2022 1244   HCT 38.6 06/15/2017 0930   PLT 224 11/20/2022 0038   PLT 265 06/15/2017 0930   MCV 91.5 11/20/2022 0038   MCV 86 06/15/2017 0930   MCH 28.4 11/20/2022 0038   MCHC 31.0 11/20/2022 0038   RDW 14.1 11/20/2022 0038   RDW 14.5 06/15/2017 0930   LYMPHSABS 2.2 11/15/2022 1700   MONOABS 0.7 11/15/2022 1700   EOSABS 0.1 11/15/2022 1700   BASOSABS 0.0 11/15/2022 1700   HEPATIC Function Panel Recent Labs    11/15/22 1700 11/22/22 0134  PROT 6.9 6.2*  ALBUMIN 3.9 2.8*  AST 19 28  ALT 21 33  ALKPHOS 45 50   HEMOGLOBIN A1C Lab Results  Component Value Date   MPG 131.24 12/03/2020   CARDIAC ENZYMES No results found for: "CKTOTAL", "CKMB", "CKMBINDEX", "  TROPONINI" BNP No results for input(s): "PROBNP" in the last 8760 hours. TSH Recent Labs    11/15/22 1700  TSH 5.142*   CHOLESTEROL No results for input(s): "CHOL" in the last 8760 hours.  Scheduled Meds:  allopurinol  100 mg Oral Daily   apixaban  5 mg Oral BID   carvedilol  12.5 mg Oral BID WC   ciprofloxacin  250 mg Oral BID   colchicine  0.6 mg Oral Daily   digoxin  0.0625 mg Oral Daily   diltiazem  60 mg Oral Q6H   furosemide  40 mg Intravenous Daily   gabapentin  400 mg Oral QHS   levothyroxine  125 mcg Oral Q0600   potassium chloride SA  10 mEq Oral BID   rosuvastatin  20 mg Oral Daily   sodium chloride flush  3 mL Intravenous Q12H   sodium chloride flush  3 mL Intravenous Q12H   sodium chloride flush  3 mL Intravenous Q12H   Continuous Infusions:  sodium chloride     sodium  chloride     albumin human     PRN Meds:.sodium chloride, sodium chloride, acetaminophen, ondansetron (ZOFRAN) IV, mouth rinse, sodium chloride flush, sodium chloride flush  Assessment/Plan: Acute systolic left heart failure, HFrEF Chronic atrial fibrillation HTN Hypothyroidism Morbid obesity Type 2 DM S/P stroke Moderate MR Left wrist gout UTI  Plan: Cipro 250 mg. Bid x 5 days.   LOS: 7 days   Time spent including chart review, lab review, examination, discussion with patient/Nurse : 30 min   [name]  MD  11/22/2022, 12:03 PM

## 2022-11-23 LAB — CBC
HCT: 37.3 % (ref 36.0–46.0)
Hemoglobin: 11.7 g/dL — ABNORMAL LOW (ref 12.0–15.0)
MCH: 28.9 pg (ref 26.0–34.0)
MCHC: 31.4 g/dL (ref 30.0–36.0)
MCV: 92.1 fL (ref 80.0–100.0)
Platelets: 261 10*3/uL (ref 150–400)
RBC: 4.05 MIL/uL (ref 3.87–5.11)
RDW: 13.4 % (ref 11.5–15.5)
WBC: 6.1 10*3/uL (ref 4.0–10.5)
nRBC: 0 % (ref 0.0–0.2)

## 2022-11-23 LAB — COMPREHENSIVE METABOLIC PANEL
ALT: 89 U/L — ABNORMAL HIGH (ref 0–44)
AST: 67 U/L — ABNORMAL HIGH (ref 15–41)
Albumin: 3.1 g/dL — ABNORMAL LOW (ref 3.5–5.0)
Alkaline Phosphatase: 61 U/L (ref 38–126)
Anion gap: 14 (ref 5–15)
BUN: 34 mg/dL — ABNORMAL HIGH (ref 8–23)
CO2: 28 mmol/L (ref 22–32)
Calcium: 9.2 mg/dL (ref 8.9–10.3)
Chloride: 101 mmol/L (ref 98–111)
Creatinine, Ser: 1.12 mg/dL — ABNORMAL HIGH (ref 0.44–1.00)
GFR, Estimated: 52 mL/min — ABNORMAL LOW (ref >60)
Glucose, Bld: 124 mg/dL — ABNORMAL HIGH (ref 70–99)
Potassium: 3.9 mmol/L (ref 3.5–5.1)
Sodium: 143 mmol/L (ref 135–145)
Total Bilirubin: 0.5 mg/dL (ref 0.3–1.2)
Total Protein: 6.9 g/dL (ref 6.5–8.1)

## 2022-11-23 LAB — LIPOPROTEIN A (LPA): Lipoprotein (a): 46.1 nmol/L — ABNORMAL HIGH (ref <75.0)

## 2022-11-23 NOTE — Evaluation (Signed)
Occupational Therapy Evaluation Patient Details Name: Candice Perez MRN: [id number] DOB: 02/20/1951 Today's Date: 11/23/2022   History of Present Illness Pt is a 72 year old woman admitted on 7/3 wtih CHF exacerabation and UTI. PMH: afib on Eliquis, DVT, CVA with residual R side weaknss and dysarthria, L TKR, prediabets, HTN, HLD, hypothyroidism, GERD, peripheral neuropathy.   Clinical Impression   Pt typically walks with a quad cane or RW indoors and uses a rollator or w/c for longer distances. She is modified independent in self care and works with her husband on IADLs. She likes to crochet. Pt presents with R side baseline weakness, impaired standing balance and decreased activity tolerance. She ambulates with min guard assist and RW and requires set up to max assist for ADLs. She typically wears slippers, no socks at home. SpO2 100% on RA. Recommending HHOT upon discharge.      Recommendations for follow up therapy are one component of a multi-disciplinary discharge planning process, led by the attending physician.  Recommendations may be updated based on patient status, additional functional criteria and insurance authorization.   Assistance Recommended at Discharge Frequent or constant Supervision/Assistance  Patient can return home with the following A little help with walking and/or transfers;A lot of help with bathing/dressing/bathroom;Assistance with cooking/housework;Assist for transportation;Help with stairs or ramp for entrance    Functional Status Assessment  Patient has had a recent decline in their functional status and demonstrates the ability to make significant improvements in function in a reasonable and predictable amount of time.  Equipment Recommendations  None recommended by OT    Recommendations for Other Services       Precautions / Restrictions Precautions Precautions: Fall Restrictions Weight Bearing Restrictions: No      Mobility Bed Mobility Overal  bed mobility: Needs Assistance Bed Mobility: Supine to Sit     Supine to sit: HOB elevated, Min guard     General bed mobility comments: increased time    Transfers Overall transfer level: Needs assistance Equipment used: Rolling walker (2 wheels) Transfers: Sit to/from Stand Sit to Stand: Min guard           General transfer comment: cues for hand placement, slow to rise      Balance Overall balance assessment: Needs assistance   Sitting balance-Leahy Scale: Fair       Standing balance-Leahy Scale: Poor Standing balance comment: reliant on RW                           ADL either performed or assessed with clinical judgement   ADL Overall ADL's : Needs assistance/impaired Eating/Feeding: Set up;Sitting   Grooming: Oral care;Wash/dry hands;Wash/dry face;Brushing hair;Sitting;Set up Grooming Details (indicate cue type and reason): seated at sink Upper Body Bathing: Minimal assistance;Sitting   Lower Body Bathing: Moderate assistance;Sit to/from stand   Upper Body Dressing : Set up;Sitting   Lower Body Dressing: Maximal assistance;Sitting/lateral leans Lower Body Dressing Details (indicate cue type and reason): assist for socks, does not normally wear, wears bedroom shoes Toilet Transfer: Min guard;Ambulation;BSC/3in1;Rolling walker (2 wheels)   Toileting- Clothing Manipulation and Hygiene: Min guard;Sit to/from stand       Functional mobility during ADLs: Min guard;Rolling walker (2 wheels)       Vision         Perception     Praxis      Pertinent Vitals/Pain Pain Assessment Pain Assessment: No/denies pain     Hand Dominance Right (leads  with L since stroke)   Extremity/Trunk Assessment Upper Extremity Assessment Upper Extremity Assessment: RUE deficits/detail RUE Deficits / Details: hemiparesis with arthritic changes in hand, uses as a functional assist, reports impaired sensation RUE Coordination: decreased fine motor;decreased  gross motor   Lower Extremity Assessment Lower Extremity Assessment: Defer to PT evaluation       Communication Communication Communication: Expressive difficulties   Cognition Arousal/Alertness: Awake/alert Behavior During Therapy: WFL for tasks assessed/performed Overall Cognitive Status: Within Functional Limits for tasks assessed                                       General Comments       Exercises     Shoulder Instructions      Home Living Family/patient expects to be discharged to:: Private residence Living Arrangements: Spouse/significant other Available Help at Discharge: Family;Available 24 hours/day Type of Home: Apartment Home Access: Level entry     Home Layout: One level     Bathroom Shower/Tub: Producer, television/film/video: Standard     Home Equipment: Production designer, theatre/television/film (4 wheels);Cane - quad;Hand held shower head;Wheelchair - Forensic psychologist (2 wheels)          Prior Functioning/Environment Prior Level of Function : Needs assist             Mobility Comments: walks with quad cane ADLs Comments: mod I in self care, husband does laundry, heavy housework, pt cooks        OT Problem List: Decreased strength;Decreased activity tolerance;Impaired balance (sitting and/or standing);Impaired UE functional use;Obesity      OT Treatment/Interventions: Self-care/ADL training;Energy conservation;DME and/or AE instruction;Therapeutic activities;Patient/family education;Balance training    OT Goals(Current goals can be found in the care plan section) Acute Rehab OT Goals OT Goal Formulation: With patient Time For Goal Achievement: 12/07/22 Potential to Achieve Goals: Good ADL Goals Pt Will Perform Grooming: standing;with supervision Pt Will Perform Upper Body Bathing: with set-up;sitting (pt will be aware of benefits of long bath sponge for back) Pt Will Perform Upper Body Dressing: with set-up;sitting Pt  Will Perform Lower Body Dressing: with min assist;sit to/from stand (simulate pants, does not wear socks) Pt Will Transfer to Toilet: with supervision;ambulating;bedside commode Pt Will Perform Toileting - Clothing Manipulation and hygiene: with modified independence;sit to/from stand  OT Frequency: Min 1X/week    Co-evaluation              AM-PAC OT "6 Clicks" Daily Activity     Outcome Measure Help from another person eating meals?: A Little Help from another person taking care of personal grooming?: A Little Help from another person toileting, which includes using toliet, bedpan, or urinal?: A Little Help from another person bathing (including washing, rinsing, drying)?: A Lot Help from another person to put on and taking off regular upper body clothing?: A Lot   6 Click Score: 13   End of Session Equipment Utilized During Treatment: Rolling walker (2 wheels);Gait belt Nurse Communication: Other (comment);Mobility status (SpO2 100% on RA)  Activity Tolerance: Patient tolerated treatment well Patient left: in chair;with call bell/phone within reach;with chair alarm set  OT Visit Diagnosis: Unsteadiness on feet (R26.81);Other abnormalities of gait and mobility (R26.89);Muscle weakness (generalized) (M62.81)                Time: 0981-1914 OT Time Calculation (min): 21 min Charges:  OT General Charges $OT Visit: 1 Visit  OT Evaluation $OT Eval Moderate Complexity: 1 Mod  Berna Spare, OTR/L Acute Rehabilitation Services Office: [phone]   Evern Bio 11/23/2022, 2:11 PM

## 2022-11-23 NOTE — Progress Notes (Signed)
Ref: Candice Ponto, MD   Subjective:  Awake.  VS stable. HR: 69 /min  Objective:  Vital Signs in the last 24 hours: Temp:  [97.9 F (36.6 C)] 97.9 F (36.6 C) (07/11 1632) Pulse Rate:  [58-79] 69 (07/11 1632) Cardiac Rhythm: Atrial fibrillation (07/11 0700) Resp:  [18-20] 18 (07/11 1632) BP: (100-125)/(54-76) 125/66 (07/11 1632) SpO2:  [96 %-100 %] 96 % (07/11 1632) Weight:  [135.5 kg] 135.5 kg (07/11 0400)  Physical Exam: BP Readings from Last 1 Encounters:  11/23/22 125/66     Wt Readings from Last 1 Encounters:  11/23/22 135.5 kg    Weight change: 0.4 kg Body mass index is 46.79 kg/m. HEENT: [place]/AT, Eyes-Hazel, Conjunctiva-Pink, Sclera-Non-icteric Neck: No JVD, No bruit, Trachea midline. Lungs:  Clearing, Bilateral. Cardiac:  Irregular rhythm, normal S1 and S2, no S3. II/VI systolic murmur. Abdomen:  Soft, non-tender. BS present. Extremities:  No edema present. No cyanosis. No clubbing. CNS: AxOx3, Cranial nerves grossly intact, moves all 4 extremities.  Skin: Warm and dry.   Intake/Output from previous day: 07/10 0701 - 07/11 0700 In: 513.7 [P.O.:472; IV Piggyback:41.7] Out: 1600 [Urine:1600]    Lab Results: BMET    Component Value Date/Time   NA 143 11/23/2022 1303   NA 140 11/22/2022 0134   NA 135 11/21/2022 0047   NA 142 06/15/2017 0930   K 3.9 11/23/2022 1303   K 4.4 11/22/2022 0134   K 4.3 11/21/2022 0047   CL 101 11/23/2022 1303   CL 100 11/22/2022 0134   CL 99 11/21/2022 0047   CO2 28 11/23/2022 1303   CO2 28 11/22/2022 0134   CO2 26 11/21/2022 0047   GLUCOSE 124 (H) 11/23/2022 1303   GLUCOSE 119 (H) 11/22/2022 0134   GLUCOSE 134 (H) 11/21/2022 0047   BUN 34 (H) 11/23/2022 1303   BUN 34 (H) 11/22/2022 0134   BUN 29 (H) 11/21/2022 0047   BUN 19 06/15/2017 0930   CREATININE 1.12 (H) 11/23/2022 1303   CREATININE 1.26 (H) 11/22/2022 0134   CREATININE 1.18 (H) 11/21/2022 0047   CALCIUM 9.2 11/23/2022 1303   CALCIUM 8.9 11/22/2022 0134    CALCIUM 8.6 (L) 11/21/2022 0047   GFRNONAA 52 (L) 11/23/2022 1303   GFRNONAA 45 (L) 11/22/2022 0134   GFRNONAA 48 (L) 11/21/2022 0047   GFRAA 68 06/15/2017 0930   GFRAA 64 (L) 08/28/2013 0429   GFRAA 77 (L) 08/27/2013 0456   CBC    Component Value Date/Time   WBC 6.1 11/23/2022 1303   RBC 4.05 11/23/2022 1303   HGB 11.7 (L) 11/23/2022 1303   HGB 12.9 06/15/2017 0930   HCT 37.3 11/23/2022 1303   HCT 38.6 06/15/2017 0930   PLT 261 11/23/2022 1303   PLT 265 06/15/2017 0930   MCV 92.1 11/23/2022 1303   MCV 86 06/15/2017 0930   MCH 28.9 11/23/2022 1303   MCHC 31.4 11/23/2022 1303   RDW 13.4 11/23/2022 1303   RDW 14.5 06/15/2017 0930   LYMPHSABS 2.2 11/15/2022 1700   MONOABS 0.7 11/15/2022 1700   EOSABS 0.1 11/15/2022 1700   BASOSABS 0.0 11/15/2022 1700   HEPATIC Function Panel Recent Labs    11/15/22 1700 11/22/22 0134 11/23/22 1303  PROT 6.9 6.2* 6.9  ALBUMIN 3.9 2.8* 3.1*  AST 19 28 67*  ALT 21 33 89*  ALKPHOS 45 50 61   HEMOGLOBIN A1C Lab Results  Component Value Date   MPG 131.24 12/03/2020   CARDIAC ENZYMES No results found for: "CKTOTAL", "CKMB", "  CKMBINDEX", "TROPONINI" BNP No results for input(s): "PROBNP" in the last 8760 hours. TSH Recent Labs    11/15/22 1700  TSH 5.142*   CHOLESTEROL No results for input(s): "CHOL" in the last 8760 hours.  Scheduled Meds:  allopurinol  100 mg Oral Daily   apixaban  5 mg Oral BID   carvedilol  12.5 mg Oral BID WC   ciprofloxacin  250 mg Oral BID   colchicine  0.6 mg Oral Daily   digoxin  0.0625 mg Oral Daily   diltiazem  60 mg Oral Q6H   furosemide  40 mg Intravenous Daily   gabapentin  400 mg Oral QHS   levothyroxine  125 mcg Oral Q0600   potassium chloride SA  10 mEq Oral BID   rosuvastatin  20 mg Oral Daily   sodium chloride flush  3 mL Intravenous Q12H   sodium chloride flush  3 mL Intravenous Q12H   sodium chloride flush  3 mL Intravenous Q12H   Continuous Infusions:  sodium chloride     sodium  chloride     PRN Meds:.sodium chloride, sodium chloride, acetaminophen, ondansetron (ZOFRAN) IV, mouth rinse, sodium chloride flush, sodium chloride flush  Assessment/Plan: Acute systolic left heart failure, HFrEF Chronic atrial fibrillation HTN Hypothyroidism Morbid obesity Type 2 DM S/P stroke Moderate MR Left wrist gout UTI CKD, 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 Discharge home in AM if stable.     LOS: 8 days   Time spent including chart review, lab review, examination, discussion with patient/Nurse/Family : 30 min   Candice Cobb  MD  11/23/2022, 6:19 PM

## 2022-11-23 NOTE — Progress Notes (Signed)
Mobility Specialist: Progress Note   11/23/22 1531  Mobility  Activity Ambulated with assistance in hallway  Level of Assistance Minimal assist, patient does 75% or more  Assistive Device Front wheel walker  Distance Ambulated (ft) 50 ft  Activity Response Tolerated well  Mobility Referral Yes  $Mobility charge 1 Mobility  Mobility Specialist Start Time (ACUTE ONLY) 1515  Mobility Specialist Stop Time (ACUTE ONLY) 1530  Mobility Specialist Time Calculation (min) (ACUTE ONLY) 15 min     Pt agreed to mobility session. Required MinA to stand from recliner, minG to ambulate. No c/o. After session, pt returned to bed.  Bed Alarm on.   Barnie Mort Mobility Specialist Please contact via SecureChat or Rehab office at [phone]

## 2022-11-23 NOTE — Evaluation (Signed)
Physical Therapy Evaluation Patient Details Name: Candice Perez MRN: [id number] DOB: Sep 10, 1950 Today's Date: 11/23/2022  History of Present Illness  Pt is a 72 year old woman admitted on 7/3 wtih CHF exacerabation and UTI. PMH: afib on Eliquis, DVT, CVA with residual R side weaknss and dysarthria, L TKR, prediabets, HTN, HLD, hypothyroidism, GERD, peripheral neuropathy.  Clinical Impression   Pt presents with generalized weakness, impaired balance requiring more supportive AD vs baseline, and impaired activity tolerance. Pt to benefit from acute PT to address deficits. Pt ambulated room distance with use of RW and close guard for safety, pt is typically ambulatory with quad cane and is independent in home. PT recommending follow up PT services post-acutely to address deficits. PT to progress mobility as tolerated, and will continue to follow acutely.          Assistance Recommended at Discharge Frequent or constant Supervision/Assistance  If plan is discharge home, recommend the following:  Can travel by private vehicle  A little help with walking and/or transfers;A little help with bathing/dressing/bathroom        Equipment Recommendations None recommended by PT  Recommendations for Other Services       Functional Status Assessment Patient has had a recent decline in their functional status and demonstrates the ability to make significant improvements in function in a reasonable and predictable amount of time.     Precautions / Restrictions Precautions Precautions: Fall Restrictions Weight Bearing Restrictions: No      Mobility  Bed Mobility                    Transfers Overall transfer level: Needs assistance Equipment used: Rolling walker (2 wheels) Transfers: Sit to/from Stand Sit to Stand: Min guard           General transfer comment: for safety, slow to rise and cues for hand placement    Ambulation/Gait Ambulation/Gait assistance: Min  guard Gait Distance (Feet): 20 Feet Assistive device: Rolling walker (2 wheels) Gait Pattern/deviations: Step-through pattern, Decreased stride length, Trunk flexed Gait velocity: decr     General Gait Details: close guard for safety, cues for RW proximity, upright posture  Stairs            Wheelchair Mobility     Tilt Bed    Modified Rankin (Stroke Patients Only)       Balance Overall balance assessment: Needs assistance   Sitting balance-Leahy Scale: Fair       Standing balance-Leahy Scale: Poor Standing balance comment: reliant on RW                             Pertinent Vitals/Pain Pain Assessment Pain Assessment: No/denies pain    Home Living Family/patient expects to be discharged to:: Private residence Living Arrangements: Spouse/significant other Available Help at Discharge: Family;Available 24 hours/day Type of Home: Apartment Home Access: Level entry       Home Layout: One level Home Equipment: BSC/3in1;Shower seat;Rollator (4 wheels);Cane - quad;Hand held shower head;Wheelchair - Forensic psychologist (2 wheels)      Prior Function Prior Level of Function : Needs assist             Mobility Comments: walks with quad cane ADLs Comments: mod I in self care, husband does laundry, heavy housework, pt cooks     Hand Dominance   Dominant Hand: Right (leads with L since stroke)    Extremity/Trunk Assessment   Upper Extremity  Assessment Upper Extremity Assessment: Defer to OT evaluation RUE Deficits / Details: hemiparesis with arthritic changes in hand, uses as a functional assist, reports impaired sensation RUE Coordination: decreased fine motor;decreased gross motor    Lower Extremity Assessment Lower Extremity Assessment: Generalized weakness    Cervical / Trunk Assessment Cervical / Trunk Assessment: Kyphotic  Communication   Communication: Expressive difficulties (history of cva)  Cognition Arousal/Alertness:  Awake/alert Behavior During Therapy: WFL for tasks assessed/performed Overall Cognitive Status: Within Functional Limits for tasks assessed                                          General Comments General comments (skin integrity, edema, etc.): vss on RA, SPO2 95% and greater throughout    Exercises     Assessment/Plan    PT Assessment Patient needs continued PT services  PT Problem List Decreased strength;Decreased mobility;Decreased activity tolerance;Decreased balance;Decreased knowledge of use of DME;Pain;Cardiopulmonary status limiting activity;Decreased knowledge of precautions;Decreased safety awareness       PT Treatment Interventions DME instruction;Therapeutic activities;Gait training;Therapeutic exercise;Patient/family education;Balance training;Stair training;Functional mobility training;Neuromuscular re-education    PT Goals (Current goals can be found in the Care Plan section)  Acute Rehab PT Goals Patient Stated Goal: home PT Goal Formulation: With patient Time For Goal Achievement: 12/07/22 Potential to Achieve Goals: Good    Frequency Min 1X/week     Co-evaluation               AM-PAC PT "6 Clicks" Mobility  Outcome Measure Help needed turning from your back to your side while in a flat bed without using bedrails?: A Little Help needed moving from lying on your back to sitting on the side of a flat bed without using bedrails?: A Little Help needed moving to and from a bed to a chair (including a wheelchair)?: A Little Help needed standing up from a chair using your arms (e.g., wheelchair or bedside chair)?: A Little Help needed to walk in hospital room?: A Little Help needed climbing 3-5 steps with a railing? : A Little 6 Click Score: 18    End of Session Equipment Utilized During Treatment: Gait belt Activity Tolerance: Patient tolerated treatment well Patient left: in chair;with call bell/phone within reach;with chair alarm  set Nurse Communication: Mobility status PT Visit Diagnosis: Other abnormalities of gait and mobility (R26.89)    Time: 4098-1191 PT Time Calculation (min) (ACUTE ONLY): 21 min   Charges:   PT Evaluation $PT Eval Low Complexity: 1 Low   PT General Charges $$ ACUTE PT VISIT: 1 Visit         Marye Round, PT DPT Acute Rehabilitation Services Secure Chat Preferred  Office [phone]   Owin Vignola E Christain [place] 11/23/2022, 4:06 PM

## 2022-11-24 ENCOUNTER — Other Ambulatory Visit (HOSPITAL_COMMUNITY): Payer: Self-pay

## 2022-11-24 MED ORDER — COLCHICINE 0.3 MG HALF TABLET
0.3000 mg | ORAL_TABLET | Freq: Every day | ORAL | Status: DC
  Administered 2022-11-24: 0.3 mg via ORAL
  Filled 2022-11-24: qty 1

## 2022-11-24 MED ORDER — DILTIAZEM HCL ER COATED BEADS 120 MG PO CP24
120.0000 mg | ORAL_CAPSULE | Freq: Every day | ORAL | 1 refills | Status: DC
  Filled 2022-11-24: qty 90, 90d supply, fill #0

## 2022-11-24 MED ORDER — COLCHICINE 0.6 MG PO TABS
0.3000 mg | ORAL_TABLET | Freq: Every day | ORAL | 1 refills | Status: DC
  Filled 2022-11-24: qty 30, 60d supply, fill #0

## 2022-11-24 MED ORDER — CIPROFLOXACIN HCL 250 MG PO TABS
250.0000 mg | ORAL_TABLET | Freq: Two times a day (BID) | ORAL | 0 refills | Status: DC
  Filled 2022-11-24: qty 8, 4d supply, fill #0

## 2022-11-24 MED ORDER — CARVEDILOL 12.5 MG PO TABS
12.5000 mg | ORAL_TABLET | Freq: Two times a day (BID) | ORAL | 1 refills | Status: DC
  Filled 2022-11-24: qty 180, 90d supply, fill #0

## 2022-11-24 MED ORDER — TORSEMIDE 20 MG PO TABS
20.0000 mg | ORAL_TABLET | Freq: Every day | ORAL | Status: DC
  Administered 2022-11-24: 20 mg via ORAL
  Filled 2022-11-24: qty 1

## 2022-11-24 MED ORDER — DILTIAZEM HCL ER COATED BEADS 120 MG PO CP24
120.0000 mg | ORAL_CAPSULE | Freq: Every day | ORAL | Status: DC
  Filled 2022-11-24: qty 1

## 2022-11-24 MED ORDER — LEVOTHYROXINE SODIUM 125 MCG PO TABS
125.0000 ug | ORAL_TABLET | Freq: Every day | ORAL | 3 refills | Status: DC
  Filled 2022-11-24: qty 30, 30d supply, fill #0

## 2022-11-24 MED ORDER — DIGOXIN 125 MCG PO TABS
0.0625 mg | ORAL_TABLET | Freq: Every day | ORAL | 3 refills | Status: DC
  Filled 2022-11-24: qty 50, 100d supply, fill #0

## 2022-11-24 MED ORDER — ALLOPURINOL 100 MG PO TABS
100.0000 mg | ORAL_TABLET | Freq: Every day | ORAL | 3 refills | Status: AC
  Filled 2022-11-24: qty 30, 30d supply, fill #0

## 2022-11-24 NOTE — Plan of Care (Signed)
  Problem: Elimination: Goal: Will not experience complications related to urinary retention Outcome: Completed/Met   

## 2022-11-24 NOTE — Discharge Summary (Signed)
Physician Discharge Summary  Patient ID: Candice Perez MRN: [id number] DOB/AGE: [birth year] 74 y.o.  Admit date: 11/15/2022 Discharge date: 11/24/2022  Admission Diagnoses: Acute diastolic left heart failure Chronic atrial fibrillation Acute respiratory failure HTN Hypothyroidism Morbid obesity Type 2 DM S/P stroke  Discharge Diagnoses:  Principal Problem:   Acute systolic left heart failure (HCC) Active problems:  Chronic atrial fibrillation  HTN  Hypothyroidism  Morbid obesity  Type 2 DM  S/P stroke  Moderate MR  Left wrist and hand gout  UTI  CKD, II to IIIa  Anemia of chronic disease  Discharged Condition: fair  Hospital Course: 72 years old white female with PMH of atrial fibrillation, Left MCA area stroke, HTN, Hypothyroidism, DVT and morbid obesity had 1 month history of shortness of breath and bilateral leg edema. She was given Torsemide 20 mg. two daily by [facility] health physician without significant diuresis over 1 month. She responded to IV furosemide 40 mg. Bid followed by oral torsemide. Her chest x-ray was without pulmonary edema but echocardiogram showed moderate LV systolic dysfunction and moderate MR. There was no obstructive CAD on cardiac cath. Right groin cath site was without pain, swelling or discharge. She developed left hand and wrist gout from diuresis. Her uric acid level was 9.2 mg/dl. This responded to colchicine (lower dose due to concomitant diltiazem use) and allopurinol. Her atrial fibrillation had improved rate control with carvedilol, diltiazem and low dose digoxin. UTI was improving with ciprofloxacin use. She will see primary care in 2 weeks and me in 1 week.  Consults: cardiology  Significant Diagnostic Studies: labs: Near normal CBC except Hgb of 10.4 to 11.7 gm. Creatinine of 1.28 on admission and 1.12 mg. At discharge.  EKG: Atrial fibrillation with RVR on admission. A.Fib, rate controlled.  CXR: Cardiomegaly otherwise  unremarkable.  Echocardiogram showed moderate LV systolic dysfunction and moderate MR.   Cardiac cath showed mild CAD.  Treatments: cardiac meds: Apixaban, carvedilol, digoxin, diltiazem, rosuvastatin and torsemide. Ciprofloxacin, colchicine and allopurinol   Discharge Exam: Blood pressure 95/64, pulse 71, temperature 98 F (36.7 C), temperature source Oral, resp. rate 16, height 5\' 7"  (1.702 m), weight 134.8 kg, SpO2 99%. General appearance: alert, cooperative and appears stated age. Head: Normocephalic, atraumatic. Eyes: Hazel eyes, pink conjunctiva, corneas clear. Wears glasses.  Neck: No adenopathy, no carotid bruit, no JVD, supple, symmetrical, trachea midline and thyroid not enlarged. Resp: Clear to auscultation bilaterally. Cardio: Regular rate and rhythm, S1, S2 normal, II/VI systolic murmur, no click, rub or gallop. GI: Soft, non-tender; bowel sounds normal; no organomegaly. Extremities: Trace edema, no cyanosis or clubbing. Skin: Warm and dry.  Neurologic: Alert and oriented X 3, thick speech, right sided weakness, slow gait.  Disposition: Discharge disposition: 01-Home or Self Care        Allergies as of 11/24/2022   No Known Allergies      Medication List     STOP taking these medications    amiodarone 200 MG tablet Commonly known as: PACERONE   amLODipine 10 MG tablet Commonly known as: NORVASC   furosemide 20 MG tablet Commonly known as: Lasix   furosemide 40 MG tablet Commonly known as: LASIX   hydrALAZINE 25 MG tablet Commonly known as: APRESOLINE       TAKE these medications    allopurinol 100 MG tablet Commonly known as: ZYLOPRIM Take 1 tablet (100 mg total) by mouth daily.   apixaban 5 MG Tabs tablet Commonly known as: ELIQUIS Take 1 tablet (5 mg total)  by mouth 2 (two) times daily.   carvedilol 12.5 MG tablet Commonly known as: COREG Take 1 tablet (12.5 mg total) by mouth 2 (two) times daily with a meal.   ciprofloxacin 250  MG tablet Commonly known as: CIPRO Take 1 tablet (250 mg total) by mouth 2 (two) times daily.   colchicine 0.6 MG tablet Take 0.5 tablets (0.3 mg total) by mouth daily.   digoxin 0.125 MG tablet Commonly known as: LANOXIN Take 0.5 tablets (0.0625 mg total) by mouth daily.   diltiazem 120 MG 24 hr capsule Commonly known as: CARDIZEM CD Take 1 capsule (120 mg total) by mouth daily.   gabapentin 400 MG capsule Commonly known as: Neurontin Take 1 capsule (400 mg total) by mouth at bedtime.   Klor-Con M20 20 MEQ tablet Generic drug: potassium chloride SA Take 20 mEq by mouth daily.   levothyroxine 125 MCG tablet Commonly known as: SYNTHROID Take 1 tablet (125 mcg total) by mouth daily at 6 (six) AM. Start taking on: November 25, 2022 What changed:  medication strength how much to take Another medication with the same name was removed. Continue taking this medication, and follow the directions you see here.   rosuvastatin 20 MG tablet Commonly known as: CRESTOR Take 1 tablet (20 mg total) by mouth daily.   TART CHERRY PO Take 500 mg by mouth 3 (three) times daily.   torsemide 20 MG tablet Commonly known as: DEMADEX Take 20 mg by mouth daily.        Follow-up Information     Marylen Ponto, MD. Go on 12/07/2022.   Specialty: Family Medicine Why: @2 :00pm Contact information: [address] [place] [address] [phone]         [name], MD Follow up in 1 week(s).   Specialty: Cardiology Contact information: [address] [place] [address] [phone]         [facility] Follow up.   Specialty: Home Health Services Why: Agency will call you to coordinate apt times Contact information: [address] [place] [address] [phone]                 Time spent: Review of old chart, current chart, lab, x-ray, cardiac tests and discussion with patient over 60 minutes.  Signed: Ricki Rodriguez 11/24/2022,  10:04 AM

## 2022-11-24 NOTE — Progress Notes (Signed)
Pt has orders to be discharged. Discharge instructions given and pt has no additional questions at this time. Medication regimen reviewed and pt educated. Pt verbalized understanding and has no additional questions. Telemetry removed. IV removed and site in good condition. Pt stable and waiting for TOC meds.

## 2022-11-24 NOTE — TOC Transition Note (Signed)
Transition of Care [facility]) - CM/SW Discharge Note   Patient Details  Name: Candice Perez MRN: [id number] Date of Birth: May 17, 1950  Transition of [facility] CM/SW Contact:  Leone Haven, RN Phone Number: 11/24/2022, 10:07 AM   Clinical Narrative:    NCM offered choice, she states she does not have a preference. NCM made referral to [place] at [facility] for HHPT, HHOT,  HHRN she is able to to take referral.  Soc will begin 24 to 48 hrs post dc.  Spouse at bedside to tranport her home today.    Final next level of care: Home w Home Health Services Barriers to Discharge: No Barriers Identified   Patient Goals and CMS Choice CMS Medicare.gov Compare Post Acute Care list provided to:: Patient Choice offered to / list presented to : Patient  Discharge Placement                         Discharge Plan and Services Additional resources added to the After Visit Summary for   In-house Referral: NA Discharge Planning Services: CM Consult Post Acute Care Choice: NA          DME Arranged: N/A DME Agency: NA       HH Arranged: PT, OT, RN HH Agency: [facility] Date [facility] Agency Contacted: 11/24/22 Time HH Agency Contacted: 1007 Representative spoke with at [facility] Agency: [place]  Social Determinants of Health (SDOH) Interventions SDOH Screenings   Food Insecurity: No Food Insecurity (11/15/2022)  Housing: Low Risk  (11/15/2022)  Transportation Needs: No Transportation Needs (11/15/2022)  Utilities: Not At Risk (11/15/2022)  Depression (PHQ2-9): Low Risk  (09/26/2022)  Tobacco Use: Medium Risk (11/15/2022)     Readmission Risk Interventions     No data to display

## 2022-11-26 DIAGNOSIS — D631 Anemia in chronic kidney disease: Secondary | ICD-10-CM | POA: Diagnosis not present

## 2022-11-26 DIAGNOSIS — M109 Gout, unspecified: Secondary | ICD-10-CM | POA: Diagnosis not present

## 2022-11-26 DIAGNOSIS — I482 Chronic atrial fibrillation, unspecified: Secondary | ICD-10-CM | POA: Diagnosis not present

## 2022-11-26 DIAGNOSIS — N1831 Chronic kidney disease, stage 3a: Secondary | ICD-10-CM | POA: Diagnosis not present

## 2022-11-26 DIAGNOSIS — I69351 Hemiplegia and hemiparesis following cerebral infarction affecting right dominant side: Secondary | ICD-10-CM | POA: Diagnosis not present

## 2022-11-26 DIAGNOSIS — I2510 Atherosclerotic heart disease of native coronary artery without angina pectoris: Secondary | ICD-10-CM | POA: Diagnosis not present

## 2022-11-26 DIAGNOSIS — I8390 Asymptomatic varicose veins of unspecified lower extremity: Secondary | ICD-10-CM | POA: Diagnosis not present

## 2022-11-26 DIAGNOSIS — I130 Hypertensive heart and chronic kidney disease with heart failure and stage 1 through stage 4 chronic kidney disease, or unspecified chronic kidney disease: Secondary | ICD-10-CM | POA: Diagnosis not present

## 2022-11-26 DIAGNOSIS — Z7901 Long term (current) use of anticoagulants: Secondary | ICD-10-CM | POA: Diagnosis not present

## 2022-11-26 DIAGNOSIS — Z86718 Personal history of other venous thrombosis and embolism: Secondary | ICD-10-CM | POA: Diagnosis not present

## 2022-11-26 DIAGNOSIS — M6289 Other specified disorders of muscle: Secondary | ICD-10-CM | POA: Diagnosis not present

## 2022-11-26 DIAGNOSIS — Z9181 History of falling: Secondary | ICD-10-CM | POA: Diagnosis not present

## 2022-11-26 DIAGNOSIS — E039 Hypothyroidism, unspecified: Secondary | ICD-10-CM | POA: Diagnosis not present

## 2022-11-26 DIAGNOSIS — E1122 Type 2 diabetes mellitus with diabetic chronic kidney disease: Secondary | ICD-10-CM | POA: Diagnosis not present

## 2022-11-26 DIAGNOSIS — Z6841 Body Mass Index (BMI) 40.0 and over, adult: Secondary | ICD-10-CM | POA: Diagnosis not present

## 2022-11-26 DIAGNOSIS — I5041 Acute combined systolic (congestive) and diastolic (congestive) heart failure: Secondary | ICD-10-CM | POA: Diagnosis not present

## 2022-11-26 DIAGNOSIS — J9600 Acute respiratory failure, unspecified whether with hypoxia or hypercapnia: Secondary | ICD-10-CM | POA: Diagnosis not present

## 2022-11-26 DIAGNOSIS — N390 Urinary tract infection, site not specified: Secondary | ICD-10-CM | POA: Diagnosis not present

## 2022-11-27 DIAGNOSIS — I2510 Atherosclerotic heart disease of native coronary artery without angina pectoris: Secondary | ICD-10-CM | POA: Diagnosis not present

## 2022-11-27 DIAGNOSIS — I69351 Hemiplegia and hemiparesis following cerebral infarction affecting right dominant side: Secondary | ICD-10-CM | POA: Diagnosis not present

## 2022-11-27 DIAGNOSIS — Z9181 History of falling: Secondary | ICD-10-CM | POA: Diagnosis not present

## 2022-11-27 DIAGNOSIS — I130 Hypertensive heart and chronic kidney disease with heart failure and stage 1 through stage 4 chronic kidney disease, or unspecified chronic kidney disease: Secondary | ICD-10-CM | POA: Diagnosis not present

## 2022-11-27 DIAGNOSIS — D631 Anemia in chronic kidney disease: Secondary | ICD-10-CM | POA: Diagnosis not present

## 2022-11-27 DIAGNOSIS — I5041 Acute combined systolic (congestive) and diastolic (congestive) heart failure: Secondary | ICD-10-CM | POA: Diagnosis not present

## 2022-11-27 DIAGNOSIS — N1831 Chronic kidney disease, stage 3a: Secondary | ICD-10-CM | POA: Diagnosis not present

## 2022-11-27 DIAGNOSIS — J9600 Acute respiratory failure, unspecified whether with hypoxia or hypercapnia: Secondary | ICD-10-CM | POA: Diagnosis not present

## 2022-11-27 DIAGNOSIS — Z86718 Personal history of other venous thrombosis and embolism: Secondary | ICD-10-CM | POA: Diagnosis not present

## 2022-11-27 DIAGNOSIS — N390 Urinary tract infection, site not specified: Secondary | ICD-10-CM | POA: Diagnosis not present

## 2022-11-27 DIAGNOSIS — Z6841 Body Mass Index (BMI) 40.0 and over, adult: Secondary | ICD-10-CM | POA: Diagnosis not present

## 2022-11-27 DIAGNOSIS — M109 Gout, unspecified: Secondary | ICD-10-CM | POA: Diagnosis not present

## 2022-11-27 DIAGNOSIS — E1122 Type 2 diabetes mellitus with diabetic chronic kidney disease: Secondary | ICD-10-CM | POA: Diagnosis not present

## 2022-11-27 DIAGNOSIS — I8390 Asymptomatic varicose veins of unspecified lower extremity: Secondary | ICD-10-CM | POA: Diagnosis not present

## 2022-11-27 DIAGNOSIS — E039 Hypothyroidism, unspecified: Secondary | ICD-10-CM | POA: Diagnosis not present

## 2022-11-27 DIAGNOSIS — I482 Chronic atrial fibrillation, unspecified: Secondary | ICD-10-CM | POA: Diagnosis not present

## 2022-11-27 DIAGNOSIS — Z7901 Long term (current) use of anticoagulants: Secondary | ICD-10-CM | POA: Diagnosis not present

## 2022-12-04 DIAGNOSIS — I5023 Acute on chronic systolic (congestive) heart failure: Secondary | ICD-10-CM | POA: Diagnosis not present

## 2022-12-04 DIAGNOSIS — I340 Nonrheumatic mitral (valve) insufficiency: Secondary | ICD-10-CM | POA: Diagnosis not present

## 2022-12-04 DIAGNOSIS — I480 Paroxysmal atrial fibrillation: Secondary | ICD-10-CM | POA: Diagnosis not present

## 2022-12-05 DIAGNOSIS — N390 Urinary tract infection, site not specified: Secondary | ICD-10-CM | POA: Diagnosis not present

## 2022-12-05 DIAGNOSIS — I482 Chronic atrial fibrillation, unspecified: Secondary | ICD-10-CM | POA: Diagnosis not present

## 2022-12-05 DIAGNOSIS — M109 Gout, unspecified: Secondary | ICD-10-CM | POA: Diagnosis not present

## 2022-12-05 DIAGNOSIS — E1122 Type 2 diabetes mellitus with diabetic chronic kidney disease: Secondary | ICD-10-CM | POA: Diagnosis not present

## 2022-12-05 DIAGNOSIS — Z9181 History of falling: Secondary | ICD-10-CM | POA: Diagnosis not present

## 2022-12-05 DIAGNOSIS — N1831 Chronic kidney disease, stage 3a: Secondary | ICD-10-CM | POA: Diagnosis not present

## 2022-12-05 DIAGNOSIS — Z6841 Body Mass Index (BMI) 40.0 and over, adult: Secondary | ICD-10-CM | POA: Diagnosis not present

## 2022-12-05 DIAGNOSIS — J9600 Acute respiratory failure, unspecified whether with hypoxia or hypercapnia: Secondary | ICD-10-CM | POA: Diagnosis not present

## 2022-12-05 DIAGNOSIS — Z86718 Personal history of other venous thrombosis and embolism: Secondary | ICD-10-CM | POA: Diagnosis not present

## 2022-12-05 DIAGNOSIS — I5041 Acute combined systolic (congestive) and diastolic (congestive) heart failure: Secondary | ICD-10-CM | POA: Diagnosis not present

## 2022-12-05 DIAGNOSIS — I2510 Atherosclerotic heart disease of native coronary artery without angina pectoris: Secondary | ICD-10-CM | POA: Diagnosis not present

## 2022-12-05 DIAGNOSIS — I130 Hypertensive heart and chronic kidney disease with heart failure and stage 1 through stage 4 chronic kidney disease, or unspecified chronic kidney disease: Secondary | ICD-10-CM | POA: Diagnosis not present

## 2022-12-05 DIAGNOSIS — I69351 Hemiplegia and hemiparesis following cerebral infarction affecting right dominant side: Secondary | ICD-10-CM | POA: Diagnosis not present

## 2022-12-05 DIAGNOSIS — I8390 Asymptomatic varicose veins of unspecified lower extremity: Secondary | ICD-10-CM | POA: Diagnosis not present

## 2022-12-05 DIAGNOSIS — E039 Hypothyroidism, unspecified: Secondary | ICD-10-CM | POA: Diagnosis not present

## 2022-12-05 DIAGNOSIS — D631 Anemia in chronic kidney disease: Secondary | ICD-10-CM | POA: Diagnosis not present

## 2022-12-05 DIAGNOSIS — Z7901 Long term (current) use of anticoagulants: Secondary | ICD-10-CM | POA: Diagnosis not present

## 2022-12-06 DIAGNOSIS — I2510 Atherosclerotic heart disease of native coronary artery without angina pectoris: Secondary | ICD-10-CM | POA: Diagnosis not present

## 2022-12-06 DIAGNOSIS — M109 Gout, unspecified: Secondary | ICD-10-CM | POA: Diagnosis not present

## 2022-12-06 DIAGNOSIS — I69351 Hemiplegia and hemiparesis following cerebral infarction affecting right dominant side: Secondary | ICD-10-CM | POA: Diagnosis not present

## 2022-12-07 DIAGNOSIS — I2510 Atherosclerotic heart disease of native coronary artery without angina pectoris: Secondary | ICD-10-CM | POA: Diagnosis not present

## 2022-12-07 DIAGNOSIS — Z6841 Body Mass Index (BMI) 40.0 and over, adult: Secondary | ICD-10-CM | POA: Diagnosis not present

## 2022-12-07 DIAGNOSIS — E039 Hypothyroidism, unspecified: Secondary | ICD-10-CM | POA: Diagnosis not present

## 2022-12-07 DIAGNOSIS — J9600 Acute respiratory failure, unspecified whether with hypoxia or hypercapnia: Secondary | ICD-10-CM | POA: Diagnosis not present

## 2022-12-07 DIAGNOSIS — I8390 Asymptomatic varicose veins of unspecified lower extremity: Secondary | ICD-10-CM | POA: Diagnosis not present

## 2022-12-07 DIAGNOSIS — I509 Heart failure, unspecified: Secondary | ICD-10-CM | POA: Diagnosis not present

## 2022-12-07 DIAGNOSIS — I5041 Acute combined systolic (congestive) and diastolic (congestive) heart failure: Secondary | ICD-10-CM | POA: Diagnosis not present

## 2022-12-07 DIAGNOSIS — N1831 Chronic kidney disease, stage 3a: Secondary | ICD-10-CM | POA: Diagnosis not present

## 2022-12-07 DIAGNOSIS — I130 Hypertensive heart and chronic kidney disease with heart failure and stage 1 through stage 4 chronic kidney disease, or unspecified chronic kidney disease: Secondary | ICD-10-CM | POA: Diagnosis not present

## 2022-12-07 DIAGNOSIS — I69351 Hemiplegia and hemiparesis following cerebral infarction affecting right dominant side: Secondary | ICD-10-CM | POA: Diagnosis not present

## 2022-12-07 DIAGNOSIS — Z86718 Personal history of other venous thrombosis and embolism: Secondary | ICD-10-CM | POA: Diagnosis not present

## 2022-12-07 DIAGNOSIS — D631 Anemia in chronic kidney disease: Secondary | ICD-10-CM | POA: Diagnosis not present

## 2022-12-07 DIAGNOSIS — Z7901 Long term (current) use of anticoagulants: Secondary | ICD-10-CM | POA: Diagnosis not present

## 2022-12-07 DIAGNOSIS — I4891 Unspecified atrial fibrillation: Secondary | ICD-10-CM | POA: Diagnosis not present

## 2022-12-07 DIAGNOSIS — I482 Chronic atrial fibrillation, unspecified: Secondary | ICD-10-CM | POA: Diagnosis not present

## 2022-12-07 DIAGNOSIS — N390 Urinary tract infection, site not specified: Secondary | ICD-10-CM | POA: Diagnosis not present

## 2022-12-07 DIAGNOSIS — E1122 Type 2 diabetes mellitus with diabetic chronic kidney disease: Secondary | ICD-10-CM | POA: Diagnosis not present

## 2022-12-07 DIAGNOSIS — M109 Gout, unspecified: Secondary | ICD-10-CM | POA: Diagnosis not present

## 2022-12-07 DIAGNOSIS — Z9181 History of falling: Secondary | ICD-10-CM | POA: Diagnosis not present

## 2022-12-08 DIAGNOSIS — I5041 Acute combined systolic (congestive) and diastolic (congestive) heart failure: Secondary | ICD-10-CM | POA: Diagnosis not present

## 2022-12-08 DIAGNOSIS — M109 Gout, unspecified: Secondary | ICD-10-CM | POA: Diagnosis not present

## 2022-12-08 DIAGNOSIS — J9600 Acute respiratory failure, unspecified whether with hypoxia or hypercapnia: Secondary | ICD-10-CM | POA: Diagnosis not present

## 2022-12-08 DIAGNOSIS — Z6841 Body Mass Index (BMI) 40.0 and over, adult: Secondary | ICD-10-CM | POA: Diagnosis not present

## 2022-12-08 DIAGNOSIS — N1831 Chronic kidney disease, stage 3a: Secondary | ICD-10-CM | POA: Diagnosis not present

## 2022-12-08 DIAGNOSIS — I69351 Hemiplegia and hemiparesis following cerebral infarction affecting right dominant side: Secondary | ICD-10-CM | POA: Diagnosis not present

## 2022-12-08 DIAGNOSIS — N390 Urinary tract infection, site not specified: Secondary | ICD-10-CM | POA: Diagnosis not present

## 2022-12-08 DIAGNOSIS — I130 Hypertensive heart and chronic kidney disease with heart failure and stage 1 through stage 4 chronic kidney disease, or unspecified chronic kidney disease: Secondary | ICD-10-CM | POA: Diagnosis not present

## 2022-12-08 DIAGNOSIS — E1122 Type 2 diabetes mellitus with diabetic chronic kidney disease: Secondary | ICD-10-CM | POA: Diagnosis not present

## 2022-12-08 DIAGNOSIS — I2510 Atherosclerotic heart disease of native coronary artery without angina pectoris: Secondary | ICD-10-CM | POA: Diagnosis not present

## 2022-12-08 DIAGNOSIS — D631 Anemia in chronic kidney disease: Secondary | ICD-10-CM | POA: Diagnosis not present

## 2022-12-08 DIAGNOSIS — I482 Chronic atrial fibrillation, unspecified: Secondary | ICD-10-CM | POA: Diagnosis not present

## 2022-12-08 DIAGNOSIS — Z86718 Personal history of other venous thrombosis and embolism: Secondary | ICD-10-CM | POA: Diagnosis not present

## 2022-12-08 DIAGNOSIS — Z9181 History of falling: Secondary | ICD-10-CM | POA: Diagnosis not present

## 2022-12-08 DIAGNOSIS — E039 Hypothyroidism, unspecified: Secondary | ICD-10-CM | POA: Diagnosis not present

## 2022-12-08 DIAGNOSIS — Z7901 Long term (current) use of anticoagulants: Secondary | ICD-10-CM | POA: Diagnosis not present

## 2022-12-08 DIAGNOSIS — I8390 Asymptomatic varicose veins of unspecified lower extremity: Secondary | ICD-10-CM | POA: Diagnosis not present

## 2022-12-12 ENCOUNTER — Other Ambulatory Visit (HOSPITAL_COMMUNITY): Payer: Self-pay

## 2022-12-12 DIAGNOSIS — J9600 Acute respiratory failure, unspecified whether with hypoxia or hypercapnia: Secondary | ICD-10-CM | POA: Diagnosis not present

## 2022-12-12 DIAGNOSIS — I5041 Acute combined systolic (congestive) and diastolic (congestive) heart failure: Secondary | ICD-10-CM | POA: Diagnosis not present

## 2022-12-12 DIAGNOSIS — Z7901 Long term (current) use of anticoagulants: Secondary | ICD-10-CM | POA: Diagnosis not present

## 2022-12-12 DIAGNOSIS — E1122 Type 2 diabetes mellitus with diabetic chronic kidney disease: Secondary | ICD-10-CM | POA: Diagnosis not present

## 2022-12-12 DIAGNOSIS — Z6841 Body Mass Index (BMI) 40.0 and over, adult: Secondary | ICD-10-CM | POA: Diagnosis not present

## 2022-12-12 DIAGNOSIS — I69351 Hemiplegia and hemiparesis following cerebral infarction affecting right dominant side: Secondary | ICD-10-CM | POA: Diagnosis not present

## 2022-12-12 DIAGNOSIS — Z86718 Personal history of other venous thrombosis and embolism: Secondary | ICD-10-CM | POA: Diagnosis not present

## 2022-12-12 DIAGNOSIS — N1831 Chronic kidney disease, stage 3a: Secondary | ICD-10-CM | POA: Diagnosis not present

## 2022-12-12 DIAGNOSIS — M109 Gout, unspecified: Secondary | ICD-10-CM | POA: Diagnosis not present

## 2022-12-12 DIAGNOSIS — N390 Urinary tract infection, site not specified: Secondary | ICD-10-CM | POA: Diagnosis not present

## 2022-12-12 DIAGNOSIS — I2510 Atherosclerotic heart disease of native coronary artery without angina pectoris: Secondary | ICD-10-CM | POA: Diagnosis not present

## 2022-12-12 DIAGNOSIS — E039 Hypothyroidism, unspecified: Secondary | ICD-10-CM | POA: Diagnosis not present

## 2022-12-12 DIAGNOSIS — D631 Anemia in chronic kidney disease: Secondary | ICD-10-CM | POA: Diagnosis not present

## 2022-12-12 DIAGNOSIS — I482 Chronic atrial fibrillation, unspecified: Secondary | ICD-10-CM | POA: Diagnosis not present

## 2022-12-12 DIAGNOSIS — I130 Hypertensive heart and chronic kidney disease with heart failure and stage 1 through stage 4 chronic kidney disease, or unspecified chronic kidney disease: Secondary | ICD-10-CM | POA: Diagnosis not present

## 2022-12-12 DIAGNOSIS — Z9181 History of falling: Secondary | ICD-10-CM | POA: Diagnosis not present

## 2022-12-12 DIAGNOSIS — I8390 Asymptomatic varicose veins of unspecified lower extremity: Secondary | ICD-10-CM | POA: Diagnosis not present

## 2022-12-14 DIAGNOSIS — Z9181 History of falling: Secondary | ICD-10-CM | POA: Diagnosis not present

## 2022-12-14 DIAGNOSIS — I69351 Hemiplegia and hemiparesis following cerebral infarction affecting right dominant side: Secondary | ICD-10-CM | POA: Diagnosis not present

## 2022-12-14 DIAGNOSIS — J9600 Acute respiratory failure, unspecified whether with hypoxia or hypercapnia: Secondary | ICD-10-CM | POA: Diagnosis not present

## 2022-12-14 DIAGNOSIS — M109 Gout, unspecified: Secondary | ICD-10-CM | POA: Diagnosis not present

## 2022-12-14 DIAGNOSIS — I8390 Asymptomatic varicose veins of unspecified lower extremity: Secondary | ICD-10-CM | POA: Diagnosis not present

## 2022-12-14 DIAGNOSIS — E039 Hypothyroidism, unspecified: Secondary | ICD-10-CM | POA: Diagnosis not present

## 2022-12-14 DIAGNOSIS — I482 Chronic atrial fibrillation, unspecified: Secondary | ICD-10-CM | POA: Diagnosis not present

## 2022-12-14 DIAGNOSIS — Z86718 Personal history of other venous thrombosis and embolism: Secondary | ICD-10-CM | POA: Diagnosis not present

## 2022-12-14 DIAGNOSIS — I130 Hypertensive heart and chronic kidney disease with heart failure and stage 1 through stage 4 chronic kidney disease, or unspecified chronic kidney disease: Secondary | ICD-10-CM | POA: Diagnosis not present

## 2022-12-14 DIAGNOSIS — D631 Anemia in chronic kidney disease: Secondary | ICD-10-CM | POA: Diagnosis not present

## 2022-12-14 DIAGNOSIS — Z6841 Body Mass Index (BMI) 40.0 and over, adult: Secondary | ICD-10-CM | POA: Diagnosis not present

## 2022-12-14 DIAGNOSIS — N390 Urinary tract infection, site not specified: Secondary | ICD-10-CM | POA: Diagnosis not present

## 2022-12-14 DIAGNOSIS — I2510 Atherosclerotic heart disease of native coronary artery without angina pectoris: Secondary | ICD-10-CM | POA: Diagnosis not present

## 2022-12-14 DIAGNOSIS — I5041 Acute combined systolic (congestive) and diastolic (congestive) heart failure: Secondary | ICD-10-CM | POA: Diagnosis not present

## 2022-12-14 DIAGNOSIS — N1831 Chronic kidney disease, stage 3a: Secondary | ICD-10-CM | POA: Diagnosis not present

## 2022-12-14 DIAGNOSIS — E1122 Type 2 diabetes mellitus with diabetic chronic kidney disease: Secondary | ICD-10-CM | POA: Diagnosis not present

## 2022-12-14 DIAGNOSIS — Z7901 Long term (current) use of anticoagulants: Secondary | ICD-10-CM | POA: Diagnosis not present

## 2022-12-17 NOTE — Progress Notes (Signed)
Late entry. Ref: Marylen Ponto, MD   Subjective:  Awake. VS stable. Slow diuresis.  Objective:  Vital Signs in the last 24 hours:    Physical Exam: BP Readings from Last 1 Encounters:  11/19/22 95/64     Wt Readings from Last 1 Encounters:  11/19/22 138 kg    Weight change:  Body mass index is 46.54 kg/m. HEENT: [place]/AT, Eyes-Hazel, Conjunctiva-Pink, Sclera-Non-icteric Neck: No JVD, No bruit, Trachea midline. Lungs:  Clearing, Bilateral. Cardiac:  Regular rhythm, normal S1 and S2, no S3. II/VI systolic murmur. Abdomen:  Soft, non-tender. BS present. Extremities:  1 + edema present. No cyanosis. No clubbing. CNS: AxOx3, Cranial nerves grossly intact, moves all 4 extremities.  Skin: Warm and dry.   Intake/Output from previous day: No intake/output data recorded.    Lab Results: BMET    Component Value Date/Time   NA 143 11/23/2022 1303   NA 140 11/22/2022 0134   NA 135 11/21/2022 0047   NA 142 06/15/2017 0930   K 3.9 11/23/2022 1303   K 4.4 11/22/2022 0134   K 4.3 11/21/2022 0047   CL 101 11/23/2022 1303   CL 100 11/22/2022 0134   CL 99 11/21/2022 0047   CO2 28 11/23/2022 1303   CO2 28 11/22/2022 0134   CO2 26 11/21/2022 0047   GLUCOSE 124 (H) 11/23/2022 1303   GLUCOSE 119 (H) 11/22/2022 0134   GLUCOSE 134 (H) 11/21/2022 0047   BUN 34 (H) 11/23/2022 1303   BUN 34 (H) 11/22/2022 0134   BUN 29 (H) 11/21/2022 0047   BUN 19 06/15/2017 0930   CREATININE 1.12 (H) 11/23/2022 1303   CREATININE 1.26 (H) 11/22/2022 0134   CREATININE 1.18 (H) 11/21/2022 0047   CALCIUM 9.2 11/23/2022 1303   CALCIUM 8.9 11/22/2022 0134   CALCIUM 8.6 (L) 11/21/2022 0047   GFRNONAA 52 (L) 11/23/2022 1303   GFRNONAA 45 (L) 11/22/2022 0134   GFRNONAA 48 (L) 11/21/2022 0047   GFRAA 68 06/15/2017 0930   GFRAA 64 (L) 08/28/2013 0429   GFRAA 77 (L) 08/27/2013 0456   CBC    Component Value Date/Time   WBC 6.1 11/23/2022 1303   RBC 4.05 11/23/2022 1303   HGB 11.7 (L) 11/23/2022 1303    HGB 12.9 06/15/2017 0930   HCT 37.3 11/23/2022 1303   HCT 38.6 06/15/2017 0930   PLT 261 11/23/2022 1303   PLT 265 06/15/2017 0930   MCV 92.1 11/23/2022 1303   MCV 86 06/15/2017 0930   MCH 28.9 11/23/2022 1303   MCHC 31.4 11/23/2022 1303   RDW 13.4 11/23/2022 1303   RDW 14.5 06/15/2017 0930   LYMPHSABS 2.2 11/15/2022 1700   MONOABS 0.7 11/15/2022 1700   EOSABS 0.1 11/15/2022 1700   BASOSABS 0.0 11/15/2022 1700   HEPATIC Function Panel Recent Labs    11/15/22 1700 11/22/22 0134 11/23/22 1303  PROT 6.9 6.2* 6.9  ALBUMIN 3.9 2.8* 3.1*  AST 19 28 67*  ALT 21 33 89*  ALKPHOS 45 50 61   HEMOGLOBIN A1C Lab Results  Component Value Date   MPG 131.24 12/03/2020   CARDIAC ENZYMES No results found for: "CKTOTAL", "CKMB", "CKMBINDEX", "TROPONINI" BNP No results for input(s): "PROBNP" in the last 8760 hours. TSH Recent Labs    11/15/22 1700  TSH 5.142*   CHOLESTEROL No results for input(s): "CHOL" in the last 8760 hours.  Scheduled Meds: Continuous Infusions: PRN Meds:.  Assessment/Plan: Acute on chronic systolic left heart failure Chronic atrial fibrillation HTN Hypothyroidism Type 2  DM Morbid Obesity S/P stroke Moderate MR  Plan: R + L Cardiac cath tomorrow.   LOS: 9 days   Time spent including chart review, lab review, examination, discussion with patient/Family/Nurse : 30 min   [name]  MD  11/19/2022, 5:23 PM

## 2022-12-18 DIAGNOSIS — I8390 Asymptomatic varicose veins of unspecified lower extremity: Secondary | ICD-10-CM | POA: Diagnosis not present

## 2022-12-18 DIAGNOSIS — J9600 Acute respiratory failure, unspecified whether with hypoxia or hypercapnia: Secondary | ICD-10-CM | POA: Diagnosis not present

## 2022-12-18 DIAGNOSIS — I5041 Acute combined systolic (congestive) and diastolic (congestive) heart failure: Secondary | ICD-10-CM | POA: Diagnosis not present

## 2022-12-18 DIAGNOSIS — N1831 Chronic kidney disease, stage 3a: Secondary | ICD-10-CM | POA: Diagnosis not present

## 2022-12-18 DIAGNOSIS — D631 Anemia in chronic kidney disease: Secondary | ICD-10-CM | POA: Diagnosis not present

## 2022-12-18 DIAGNOSIS — I2510 Atherosclerotic heart disease of native coronary artery without angina pectoris: Secondary | ICD-10-CM | POA: Diagnosis not present

## 2022-12-18 DIAGNOSIS — N390 Urinary tract infection, site not specified: Secondary | ICD-10-CM | POA: Diagnosis not present

## 2022-12-18 DIAGNOSIS — I482 Chronic atrial fibrillation, unspecified: Secondary | ICD-10-CM | POA: Diagnosis not present

## 2022-12-18 DIAGNOSIS — E1122 Type 2 diabetes mellitus with diabetic chronic kidney disease: Secondary | ICD-10-CM | POA: Diagnosis not present

## 2022-12-18 DIAGNOSIS — Z86718 Personal history of other venous thrombosis and embolism: Secondary | ICD-10-CM | POA: Diagnosis not present

## 2022-12-18 DIAGNOSIS — Z7901 Long term (current) use of anticoagulants: Secondary | ICD-10-CM | POA: Diagnosis not present

## 2022-12-18 DIAGNOSIS — M109 Gout, unspecified: Secondary | ICD-10-CM | POA: Diagnosis not present

## 2022-12-18 DIAGNOSIS — Z9181 History of falling: Secondary | ICD-10-CM | POA: Diagnosis not present

## 2022-12-18 DIAGNOSIS — E039 Hypothyroidism, unspecified: Secondary | ICD-10-CM | POA: Diagnosis not present

## 2022-12-18 DIAGNOSIS — I130 Hypertensive heart and chronic kidney disease with heart failure and stage 1 through stage 4 chronic kidney disease, or unspecified chronic kidney disease: Secondary | ICD-10-CM | POA: Diagnosis not present

## 2022-12-18 DIAGNOSIS — Z6841 Body Mass Index (BMI) 40.0 and over, adult: Secondary | ICD-10-CM | POA: Diagnosis not present

## 2022-12-18 DIAGNOSIS — I69351 Hemiplegia and hemiparesis following cerebral infarction affecting right dominant side: Secondary | ICD-10-CM | POA: Diagnosis not present

## 2022-12-20 ENCOUNTER — Other Ambulatory Visit (HOSPITAL_COMMUNITY): Payer: Self-pay

## 2022-12-21 DIAGNOSIS — I130 Hypertensive heart and chronic kidney disease with heart failure and stage 1 through stage 4 chronic kidney disease, or unspecified chronic kidney disease: Secondary | ICD-10-CM | POA: Diagnosis not present

## 2022-12-21 DIAGNOSIS — Z86718 Personal history of other venous thrombosis and embolism: Secondary | ICD-10-CM | POA: Diagnosis not present

## 2022-12-21 DIAGNOSIS — M109 Gout, unspecified: Secondary | ICD-10-CM | POA: Diagnosis not present

## 2022-12-21 DIAGNOSIS — J9600 Acute respiratory failure, unspecified whether with hypoxia or hypercapnia: Secondary | ICD-10-CM | POA: Diagnosis not present

## 2022-12-21 DIAGNOSIS — I482 Chronic atrial fibrillation, unspecified: Secondary | ICD-10-CM | POA: Diagnosis not present

## 2022-12-21 DIAGNOSIS — E1122 Type 2 diabetes mellitus with diabetic chronic kidney disease: Secondary | ICD-10-CM | POA: Diagnosis not present

## 2022-12-21 DIAGNOSIS — Z7901 Long term (current) use of anticoagulants: Secondary | ICD-10-CM | POA: Diagnosis not present

## 2022-12-21 DIAGNOSIS — I2510 Atherosclerotic heart disease of native coronary artery without angina pectoris: Secondary | ICD-10-CM | POA: Diagnosis not present

## 2022-12-21 DIAGNOSIS — E039 Hypothyroidism, unspecified: Secondary | ICD-10-CM | POA: Diagnosis not present

## 2022-12-21 DIAGNOSIS — I5041 Acute combined systolic (congestive) and diastolic (congestive) heart failure: Secondary | ICD-10-CM | POA: Diagnosis not present

## 2022-12-21 DIAGNOSIS — D631 Anemia in chronic kidney disease: Secondary | ICD-10-CM | POA: Diagnosis not present

## 2022-12-21 DIAGNOSIS — I69351 Hemiplegia and hemiparesis following cerebral infarction affecting right dominant side: Secondary | ICD-10-CM | POA: Diagnosis not present

## 2022-12-21 DIAGNOSIS — Z9181 History of falling: Secondary | ICD-10-CM | POA: Diagnosis not present

## 2022-12-21 DIAGNOSIS — N390 Urinary tract infection, site not specified: Secondary | ICD-10-CM | POA: Diagnosis not present

## 2022-12-21 DIAGNOSIS — N1831 Chronic kidney disease, stage 3a: Secondary | ICD-10-CM | POA: Diagnosis not present

## 2022-12-21 DIAGNOSIS — Z6841 Body Mass Index (BMI) 40.0 and over, adult: Secondary | ICD-10-CM | POA: Diagnosis not present

## 2022-12-21 DIAGNOSIS — I8390 Asymptomatic varicose veins of unspecified lower extremity: Secondary | ICD-10-CM | POA: Diagnosis not present

## 2022-12-25 DIAGNOSIS — I130 Hypertensive heart and chronic kidney disease with heart failure and stage 1 through stage 4 chronic kidney disease, or unspecified chronic kidney disease: Secondary | ICD-10-CM | POA: Diagnosis not present

## 2022-12-25 DIAGNOSIS — Z9181 History of falling: Secondary | ICD-10-CM | POA: Diagnosis not present

## 2022-12-25 DIAGNOSIS — Z86718 Personal history of other venous thrombosis and embolism: Secondary | ICD-10-CM | POA: Diagnosis not present

## 2022-12-25 DIAGNOSIS — I482 Chronic atrial fibrillation, unspecified: Secondary | ICD-10-CM | POA: Diagnosis not present

## 2022-12-25 DIAGNOSIS — J9600 Acute respiratory failure, unspecified whether with hypoxia or hypercapnia: Secondary | ICD-10-CM | POA: Diagnosis not present

## 2022-12-25 DIAGNOSIS — Z7901 Long term (current) use of anticoagulants: Secondary | ICD-10-CM | POA: Diagnosis not present

## 2022-12-25 DIAGNOSIS — M109 Gout, unspecified: Secondary | ICD-10-CM | POA: Diagnosis not present

## 2022-12-25 DIAGNOSIS — E039 Hypothyroidism, unspecified: Secondary | ICD-10-CM | POA: Diagnosis not present

## 2022-12-25 DIAGNOSIS — E1122 Type 2 diabetes mellitus with diabetic chronic kidney disease: Secondary | ICD-10-CM | POA: Diagnosis not present

## 2022-12-25 DIAGNOSIS — I5041 Acute combined systolic (congestive) and diastolic (congestive) heart failure: Secondary | ICD-10-CM | POA: Diagnosis not present

## 2022-12-25 DIAGNOSIS — I69351 Hemiplegia and hemiparesis following cerebral infarction affecting right dominant side: Secondary | ICD-10-CM | POA: Diagnosis not present

## 2022-12-25 DIAGNOSIS — Z6841 Body Mass Index (BMI) 40.0 and over, adult: Secondary | ICD-10-CM | POA: Diagnosis not present

## 2022-12-25 DIAGNOSIS — N1831 Chronic kidney disease, stage 3a: Secondary | ICD-10-CM | POA: Diagnosis not present

## 2022-12-25 DIAGNOSIS — I8390 Asymptomatic varicose veins of unspecified lower extremity: Secondary | ICD-10-CM | POA: Diagnosis not present

## 2022-12-25 DIAGNOSIS — D631 Anemia in chronic kidney disease: Secondary | ICD-10-CM | POA: Diagnosis not present

## 2022-12-25 DIAGNOSIS — I2510 Atherosclerotic heart disease of native coronary artery without angina pectoris: Secondary | ICD-10-CM | POA: Diagnosis not present

## 2022-12-25 DIAGNOSIS — N390 Urinary tract infection, site not specified: Secondary | ICD-10-CM | POA: Diagnosis not present

## 2022-12-26 DIAGNOSIS — I69351 Hemiplegia and hemiparesis following cerebral infarction affecting right dominant side: Secondary | ICD-10-CM | POA: Diagnosis not present

## 2022-12-26 DIAGNOSIS — D631 Anemia in chronic kidney disease: Secondary | ICD-10-CM | POA: Diagnosis not present

## 2022-12-26 DIAGNOSIS — J9600 Acute respiratory failure, unspecified whether with hypoxia or hypercapnia: Secondary | ICD-10-CM | POA: Diagnosis not present

## 2022-12-26 DIAGNOSIS — M109 Gout, unspecified: Secondary | ICD-10-CM | POA: Diagnosis not present

## 2022-12-26 DIAGNOSIS — I8390 Asymptomatic varicose veins of unspecified lower extremity: Secondary | ICD-10-CM | POA: Diagnosis not present

## 2022-12-26 DIAGNOSIS — E039 Hypothyroidism, unspecified: Secondary | ICD-10-CM | POA: Diagnosis not present

## 2022-12-26 DIAGNOSIS — I2510 Atherosclerotic heart disease of native coronary artery without angina pectoris: Secondary | ICD-10-CM | POA: Diagnosis not present

## 2022-12-26 DIAGNOSIS — I482 Chronic atrial fibrillation, unspecified: Secondary | ICD-10-CM | POA: Diagnosis not present

## 2022-12-26 DIAGNOSIS — Z86718 Personal history of other venous thrombosis and embolism: Secondary | ICD-10-CM | POA: Diagnosis not present

## 2022-12-26 DIAGNOSIS — N1831 Chronic kidney disease, stage 3a: Secondary | ICD-10-CM | POA: Diagnosis not present

## 2022-12-26 DIAGNOSIS — N390 Urinary tract infection, site not specified: Secondary | ICD-10-CM | POA: Diagnosis not present

## 2022-12-26 DIAGNOSIS — E1122 Type 2 diabetes mellitus with diabetic chronic kidney disease: Secondary | ICD-10-CM | POA: Diagnosis not present

## 2022-12-26 DIAGNOSIS — I5041 Acute combined systolic (congestive) and diastolic (congestive) heart failure: Secondary | ICD-10-CM | POA: Diagnosis not present

## 2022-12-26 DIAGNOSIS — Z9181 History of falling: Secondary | ICD-10-CM | POA: Diagnosis not present

## 2022-12-26 DIAGNOSIS — I130 Hypertensive heart and chronic kidney disease with heart failure and stage 1 through stage 4 chronic kidney disease, or unspecified chronic kidney disease: Secondary | ICD-10-CM | POA: Diagnosis not present

## 2022-12-26 DIAGNOSIS — Z6841 Body Mass Index (BMI) 40.0 and over, adult: Secondary | ICD-10-CM | POA: Diagnosis not present

## 2022-12-26 DIAGNOSIS — Z7901 Long term (current) use of anticoagulants: Secondary | ICD-10-CM | POA: Diagnosis not present

## 2022-12-28 DIAGNOSIS — I8390 Asymptomatic varicose veins of unspecified lower extremity: Secondary | ICD-10-CM | POA: Diagnosis not present

## 2022-12-28 DIAGNOSIS — Z6841 Body Mass Index (BMI) 40.0 and over, adult: Secondary | ICD-10-CM | POA: Diagnosis not present

## 2022-12-28 DIAGNOSIS — I69351 Hemiplegia and hemiparesis following cerebral infarction affecting right dominant side: Secondary | ICD-10-CM | POA: Diagnosis not present

## 2022-12-28 DIAGNOSIS — I130 Hypertensive heart and chronic kidney disease with heart failure and stage 1 through stage 4 chronic kidney disease, or unspecified chronic kidney disease: Secondary | ICD-10-CM | POA: Diagnosis not present

## 2022-12-28 DIAGNOSIS — E1122 Type 2 diabetes mellitus with diabetic chronic kidney disease: Secondary | ICD-10-CM | POA: Diagnosis not present

## 2022-12-28 DIAGNOSIS — D631 Anemia in chronic kidney disease: Secondary | ICD-10-CM | POA: Diagnosis not present

## 2022-12-28 DIAGNOSIS — Z9181 History of falling: Secondary | ICD-10-CM | POA: Diagnosis not present

## 2022-12-28 DIAGNOSIS — N1831 Chronic kidney disease, stage 3a: Secondary | ICD-10-CM | POA: Diagnosis not present

## 2022-12-28 DIAGNOSIS — Z86718 Personal history of other venous thrombosis and embolism: Secondary | ICD-10-CM | POA: Diagnosis not present

## 2022-12-28 DIAGNOSIS — I5041 Acute combined systolic (congestive) and diastolic (congestive) heart failure: Secondary | ICD-10-CM | POA: Diagnosis not present

## 2022-12-28 DIAGNOSIS — E039 Hypothyroidism, unspecified: Secondary | ICD-10-CM | POA: Diagnosis not present

## 2022-12-28 DIAGNOSIS — I482 Chronic atrial fibrillation, unspecified: Secondary | ICD-10-CM | POA: Diagnosis not present

## 2022-12-28 DIAGNOSIS — N390 Urinary tract infection, site not specified: Secondary | ICD-10-CM | POA: Diagnosis not present

## 2022-12-28 DIAGNOSIS — M109 Gout, unspecified: Secondary | ICD-10-CM | POA: Diagnosis not present

## 2022-12-28 DIAGNOSIS — Z7901 Long term (current) use of anticoagulants: Secondary | ICD-10-CM | POA: Diagnosis not present

## 2022-12-28 DIAGNOSIS — I2510 Atherosclerotic heart disease of native coronary artery without angina pectoris: Secondary | ICD-10-CM | POA: Diagnosis not present

## 2022-12-28 DIAGNOSIS — J9600 Acute respiratory failure, unspecified whether with hypoxia or hypercapnia: Secondary | ICD-10-CM | POA: Diagnosis not present

## 2022-12-31 DIAGNOSIS — D6869 Other thrombophilia: Secondary | ICD-10-CM | POA: Diagnosis not present

## 2022-12-31 DIAGNOSIS — E034 Atrophy of thyroid (acquired): Secondary | ICD-10-CM | POA: Diagnosis not present

## 2022-12-31 DIAGNOSIS — I4891 Unspecified atrial fibrillation: Secondary | ICD-10-CM | POA: Diagnosis not present

## 2022-12-31 DIAGNOSIS — F411 Generalized anxiety disorder: Secondary | ICD-10-CM | POA: Diagnosis not present

## 2022-12-31 DIAGNOSIS — M1990 Unspecified osteoarthritis, unspecified site: Secondary | ICD-10-CM | POA: Diagnosis not present

## 2022-12-31 DIAGNOSIS — M8580 Other specified disorders of bone density and structure, unspecified site: Secondary | ICD-10-CM | POA: Diagnosis not present

## 2022-12-31 DIAGNOSIS — K219 Gastro-esophageal reflux disease without esophagitis: Secondary | ICD-10-CM | POA: Diagnosis not present

## 2022-12-31 DIAGNOSIS — R32 Unspecified urinary incontinence: Secondary | ICD-10-CM | POA: Diagnosis not present

## 2022-12-31 DIAGNOSIS — Z008 Encounter for other general examination: Secondary | ICD-10-CM | POA: Diagnosis not present

## 2022-12-31 DIAGNOSIS — I2510 Atherosclerotic heart disease of native coronary artery without angina pectoris: Secondary | ICD-10-CM | POA: Diagnosis not present

## 2022-12-31 DIAGNOSIS — G629 Polyneuropathy, unspecified: Secondary | ICD-10-CM | POA: Diagnosis not present

## 2022-12-31 DIAGNOSIS — E785 Hyperlipidemia, unspecified: Secondary | ICD-10-CM | POA: Diagnosis not present

## 2023-01-03 DIAGNOSIS — I129 Hypertensive chronic kidney disease with stage 1 through stage 4 chronic kidney disease, or unspecified chronic kidney disease: Secondary | ICD-10-CM | POA: Diagnosis not present

## 2023-01-03 DIAGNOSIS — I480 Paroxysmal atrial fibrillation: Secondary | ICD-10-CM | POA: Diagnosis not present

## 2023-01-03 DIAGNOSIS — I509 Heart failure, unspecified: Secondary | ICD-10-CM | POA: Diagnosis not present

## 2023-01-03 DIAGNOSIS — I340 Nonrheumatic mitral (valve) insufficiency: Secondary | ICD-10-CM | POA: Diagnosis not present

## 2023-01-03 DIAGNOSIS — I4891 Unspecified atrial fibrillation: Secondary | ICD-10-CM | POA: Diagnosis not present

## 2023-01-03 DIAGNOSIS — I5023 Acute on chronic systolic (congestive) heart failure: Secondary | ICD-10-CM | POA: Diagnosis not present

## 2023-01-03 DIAGNOSIS — G629 Polyneuropathy, unspecified: Secondary | ICD-10-CM | POA: Diagnosis not present

## 2023-01-03 DIAGNOSIS — N183 Chronic kidney disease, stage 3 unspecified: Secondary | ICD-10-CM | POA: Diagnosis not present

## 2023-01-03 DIAGNOSIS — R7303 Prediabetes: Secondary | ICD-10-CM | POA: Diagnosis not present

## 2023-01-03 DIAGNOSIS — I5022 Chronic systolic (congestive) heart failure: Secondary | ICD-10-CM | POA: Diagnosis not present

## 2023-01-04 DIAGNOSIS — I2510 Atherosclerotic heart disease of native coronary artery without angina pectoris: Secondary | ICD-10-CM | POA: Diagnosis not present

## 2023-01-04 DIAGNOSIS — Z9181 History of falling: Secondary | ICD-10-CM | POA: Diagnosis not present

## 2023-01-04 DIAGNOSIS — D631 Anemia in chronic kidney disease: Secondary | ICD-10-CM | POA: Diagnosis not present

## 2023-01-04 DIAGNOSIS — I69351 Hemiplegia and hemiparesis following cerebral infarction affecting right dominant side: Secondary | ICD-10-CM | POA: Diagnosis not present

## 2023-01-04 DIAGNOSIS — N1831 Chronic kidney disease, stage 3a: Secondary | ICD-10-CM | POA: Diagnosis not present

## 2023-01-04 DIAGNOSIS — I8390 Asymptomatic varicose veins of unspecified lower extremity: Secondary | ICD-10-CM | POA: Diagnosis not present

## 2023-01-04 DIAGNOSIS — Z6841 Body Mass Index (BMI) 40.0 and over, adult: Secondary | ICD-10-CM | POA: Diagnosis not present

## 2023-01-04 DIAGNOSIS — E1122 Type 2 diabetes mellitus with diabetic chronic kidney disease: Secondary | ICD-10-CM | POA: Diagnosis not present

## 2023-01-04 DIAGNOSIS — M109 Gout, unspecified: Secondary | ICD-10-CM | POA: Diagnosis not present

## 2023-01-04 DIAGNOSIS — Z86718 Personal history of other venous thrombosis and embolism: Secondary | ICD-10-CM | POA: Diagnosis not present

## 2023-01-04 DIAGNOSIS — E039 Hypothyroidism, unspecified: Secondary | ICD-10-CM | POA: Diagnosis not present

## 2023-01-04 DIAGNOSIS — N390 Urinary tract infection, site not specified: Secondary | ICD-10-CM | POA: Diagnosis not present

## 2023-01-04 DIAGNOSIS — Z7901 Long term (current) use of anticoagulants: Secondary | ICD-10-CM | POA: Diagnosis not present

## 2023-01-04 DIAGNOSIS — I482 Chronic atrial fibrillation, unspecified: Secondary | ICD-10-CM | POA: Diagnosis not present

## 2023-01-04 DIAGNOSIS — I130 Hypertensive heart and chronic kidney disease with heart failure and stage 1 through stage 4 chronic kidney disease, or unspecified chronic kidney disease: Secondary | ICD-10-CM | POA: Diagnosis not present

## 2023-01-04 DIAGNOSIS — I5041 Acute combined systolic (congestive) and diastolic (congestive) heart failure: Secondary | ICD-10-CM | POA: Diagnosis not present

## 2023-01-04 DIAGNOSIS — J9600 Acute respiratory failure, unspecified whether with hypoxia or hypercapnia: Secondary | ICD-10-CM | POA: Diagnosis not present

## 2023-01-24 DIAGNOSIS — N1831 Chronic kidney disease, stage 3a: Secondary | ICD-10-CM | POA: Diagnosis not present

## 2023-01-24 DIAGNOSIS — I8390 Asymptomatic varicose veins of unspecified lower extremity: Secondary | ICD-10-CM | POA: Diagnosis not present

## 2023-01-24 DIAGNOSIS — I69351 Hemiplegia and hemiparesis following cerebral infarction affecting right dominant side: Secondary | ICD-10-CM | POA: Diagnosis not present

## 2023-01-24 DIAGNOSIS — E039 Hypothyroidism, unspecified: Secondary | ICD-10-CM | POA: Diagnosis not present

## 2023-01-24 DIAGNOSIS — Z6841 Body Mass Index (BMI) 40.0 and over, adult: Secondary | ICD-10-CM | POA: Diagnosis not present

## 2023-01-24 DIAGNOSIS — I130 Hypertensive heart and chronic kidney disease with heart failure and stage 1 through stage 4 chronic kidney disease, or unspecified chronic kidney disease: Secondary | ICD-10-CM | POA: Diagnosis not present

## 2023-01-24 DIAGNOSIS — Z7901 Long term (current) use of anticoagulants: Secondary | ICD-10-CM | POA: Diagnosis not present

## 2023-01-24 DIAGNOSIS — J9600 Acute respiratory failure, unspecified whether with hypoxia or hypercapnia: Secondary | ICD-10-CM | POA: Diagnosis not present

## 2023-01-24 DIAGNOSIS — I482 Chronic atrial fibrillation, unspecified: Secondary | ICD-10-CM | POA: Diagnosis not present

## 2023-01-24 DIAGNOSIS — N390 Urinary tract infection, site not specified: Secondary | ICD-10-CM | POA: Diagnosis not present

## 2023-01-24 DIAGNOSIS — D631 Anemia in chronic kidney disease: Secondary | ICD-10-CM | POA: Diagnosis not present

## 2023-01-24 DIAGNOSIS — E1122 Type 2 diabetes mellitus with diabetic chronic kidney disease: Secondary | ICD-10-CM | POA: Diagnosis not present

## 2023-01-24 DIAGNOSIS — Z9181 History of falling: Secondary | ICD-10-CM | POA: Diagnosis not present

## 2023-01-24 DIAGNOSIS — M109 Gout, unspecified: Secondary | ICD-10-CM | POA: Diagnosis not present

## 2023-01-24 DIAGNOSIS — I5041 Acute combined systolic (congestive) and diastolic (congestive) heart failure: Secondary | ICD-10-CM | POA: Diagnosis not present

## 2023-01-24 DIAGNOSIS — Z86718 Personal history of other venous thrombosis and embolism: Secondary | ICD-10-CM | POA: Diagnosis not present

## 2023-01-24 DIAGNOSIS — I2510 Atherosclerotic heart disease of native coronary artery without angina pectoris: Secondary | ICD-10-CM | POA: Diagnosis not present

## 2023-01-29 DIAGNOSIS — M109 Gout, unspecified: Secondary | ICD-10-CM | POA: Diagnosis not present

## 2023-01-29 DIAGNOSIS — I8390 Asymptomatic varicose veins of unspecified lower extremity: Secondary | ICD-10-CM | POA: Diagnosis not present

## 2023-01-29 DIAGNOSIS — I5041 Acute combined systolic (congestive) and diastolic (congestive) heart failure: Secondary | ICD-10-CM | POA: Diagnosis not present

## 2023-01-29 DIAGNOSIS — E039 Hypothyroidism, unspecified: Secondary | ICD-10-CM | POA: Diagnosis not present

## 2023-01-29 DIAGNOSIS — I130 Hypertensive heart and chronic kidney disease with heart failure and stage 1 through stage 4 chronic kidney disease, or unspecified chronic kidney disease: Secondary | ICD-10-CM | POA: Diagnosis not present

## 2023-01-29 DIAGNOSIS — Z9181 History of falling: Secondary | ICD-10-CM | POA: Diagnosis not present

## 2023-01-29 DIAGNOSIS — Z7901 Long term (current) use of anticoagulants: Secondary | ICD-10-CM | POA: Diagnosis not present

## 2023-01-29 DIAGNOSIS — J9600 Acute respiratory failure, unspecified whether with hypoxia or hypercapnia: Secondary | ICD-10-CM | POA: Diagnosis not present

## 2023-01-29 DIAGNOSIS — I2510 Atherosclerotic heart disease of native coronary artery without angina pectoris: Secondary | ICD-10-CM | POA: Diagnosis not present

## 2023-01-29 DIAGNOSIS — Z6841 Body Mass Index (BMI) 40.0 and over, adult: Secondary | ICD-10-CM | POA: Diagnosis not present

## 2023-01-29 DIAGNOSIS — N1831 Chronic kidney disease, stage 3a: Secondary | ICD-10-CM | POA: Diagnosis not present

## 2023-01-29 DIAGNOSIS — E1122 Type 2 diabetes mellitus with diabetic chronic kidney disease: Secondary | ICD-10-CM | POA: Diagnosis not present

## 2023-01-29 DIAGNOSIS — N390 Urinary tract infection, site not specified: Secondary | ICD-10-CM | POA: Diagnosis not present

## 2023-01-29 DIAGNOSIS — I69351 Hemiplegia and hemiparesis following cerebral infarction affecting right dominant side: Secondary | ICD-10-CM | POA: Diagnosis not present

## 2023-01-29 DIAGNOSIS — Z86718 Personal history of other venous thrombosis and embolism: Secondary | ICD-10-CM | POA: Diagnosis not present

## 2023-01-29 DIAGNOSIS — I482 Chronic atrial fibrillation, unspecified: Secondary | ICD-10-CM | POA: Diagnosis not present

## 2023-01-29 DIAGNOSIS — D631 Anemia in chronic kidney disease: Secondary | ICD-10-CM | POA: Diagnosis not present

## 2023-02-01 DIAGNOSIS — E039 Hypothyroidism, unspecified: Secondary | ICD-10-CM | POA: Diagnosis not present

## 2023-02-01 DIAGNOSIS — I69351 Hemiplegia and hemiparesis following cerebral infarction affecting right dominant side: Secondary | ICD-10-CM | POA: Diagnosis not present

## 2023-02-01 DIAGNOSIS — I8390 Asymptomatic varicose veins of unspecified lower extremity: Secondary | ICD-10-CM | POA: Diagnosis not present

## 2023-02-01 DIAGNOSIS — E1122 Type 2 diabetes mellitus with diabetic chronic kidney disease: Secondary | ICD-10-CM | POA: Diagnosis not present

## 2023-02-01 DIAGNOSIS — I482 Chronic atrial fibrillation, unspecified: Secondary | ICD-10-CM | POA: Diagnosis not present

## 2023-02-01 DIAGNOSIS — D631 Anemia in chronic kidney disease: Secondary | ICD-10-CM | POA: Diagnosis not present

## 2023-02-01 DIAGNOSIS — J9600 Acute respiratory failure, unspecified whether with hypoxia or hypercapnia: Secondary | ICD-10-CM | POA: Diagnosis not present

## 2023-02-01 DIAGNOSIS — M109 Gout, unspecified: Secondary | ICD-10-CM | POA: Diagnosis not present

## 2023-02-01 DIAGNOSIS — I2510 Atherosclerotic heart disease of native coronary artery without angina pectoris: Secondary | ICD-10-CM | POA: Diagnosis not present

## 2023-02-01 DIAGNOSIS — N1831 Chronic kidney disease, stage 3a: Secondary | ICD-10-CM | POA: Diagnosis not present

## 2023-02-01 DIAGNOSIS — I130 Hypertensive heart and chronic kidney disease with heart failure and stage 1 through stage 4 chronic kidney disease, or unspecified chronic kidney disease: Secondary | ICD-10-CM | POA: Diagnosis not present

## 2023-02-01 DIAGNOSIS — N390 Urinary tract infection, site not specified: Secondary | ICD-10-CM | POA: Diagnosis not present

## 2023-02-01 DIAGNOSIS — Z9181 History of falling: Secondary | ICD-10-CM | POA: Diagnosis not present

## 2023-02-01 DIAGNOSIS — Z86718 Personal history of other venous thrombosis and embolism: Secondary | ICD-10-CM | POA: Diagnosis not present

## 2023-02-01 DIAGNOSIS — I5041 Acute combined systolic (congestive) and diastolic (congestive) heart failure: Secondary | ICD-10-CM | POA: Diagnosis not present

## 2023-02-01 DIAGNOSIS — Z7901 Long term (current) use of anticoagulants: Secondary | ICD-10-CM | POA: Diagnosis not present

## 2023-02-01 DIAGNOSIS — Z6841 Body Mass Index (BMI) 40.0 and over, adult: Secondary | ICD-10-CM | POA: Diagnosis not present

## 2023-02-05 DIAGNOSIS — E039 Hypothyroidism, unspecified: Secondary | ICD-10-CM | POA: Diagnosis not present

## 2023-02-05 DIAGNOSIS — E1122 Type 2 diabetes mellitus with diabetic chronic kidney disease: Secondary | ICD-10-CM | POA: Diagnosis not present

## 2023-02-05 DIAGNOSIS — Z6841 Body Mass Index (BMI) 40.0 and over, adult: Secondary | ICD-10-CM | POA: Diagnosis not present

## 2023-02-05 DIAGNOSIS — I8390 Asymptomatic varicose veins of unspecified lower extremity: Secondary | ICD-10-CM | POA: Diagnosis not present

## 2023-02-05 DIAGNOSIS — I2510 Atherosclerotic heart disease of native coronary artery without angina pectoris: Secondary | ICD-10-CM | POA: Diagnosis not present

## 2023-02-05 DIAGNOSIS — N390 Urinary tract infection, site not specified: Secondary | ICD-10-CM | POA: Diagnosis not present

## 2023-02-05 DIAGNOSIS — Z86718 Personal history of other venous thrombosis and embolism: Secondary | ICD-10-CM | POA: Diagnosis not present

## 2023-02-05 DIAGNOSIS — N1831 Chronic kidney disease, stage 3a: Secondary | ICD-10-CM | POA: Diagnosis not present

## 2023-02-05 DIAGNOSIS — I69351 Hemiplegia and hemiparesis following cerebral infarction affecting right dominant side: Secondary | ICD-10-CM | POA: Diagnosis not present

## 2023-02-05 DIAGNOSIS — I482 Chronic atrial fibrillation, unspecified: Secondary | ICD-10-CM | POA: Diagnosis not present

## 2023-02-05 DIAGNOSIS — Z7901 Long term (current) use of anticoagulants: Secondary | ICD-10-CM | POA: Diagnosis not present

## 2023-02-05 DIAGNOSIS — I5041 Acute combined systolic (congestive) and diastolic (congestive) heart failure: Secondary | ICD-10-CM | POA: Diagnosis not present

## 2023-02-05 DIAGNOSIS — I130 Hypertensive heart and chronic kidney disease with heart failure and stage 1 through stage 4 chronic kidney disease, or unspecified chronic kidney disease: Secondary | ICD-10-CM | POA: Diagnosis not present

## 2023-02-05 DIAGNOSIS — J9600 Acute respiratory failure, unspecified whether with hypoxia or hypercapnia: Secondary | ICD-10-CM | POA: Diagnosis not present

## 2023-02-05 DIAGNOSIS — D631 Anemia in chronic kidney disease: Secondary | ICD-10-CM | POA: Diagnosis not present

## 2023-02-05 DIAGNOSIS — M109 Gout, unspecified: Secondary | ICD-10-CM | POA: Diagnosis not present

## 2023-02-05 DIAGNOSIS — Z9181 History of falling: Secondary | ICD-10-CM | POA: Diagnosis not present

## 2023-02-06 DIAGNOSIS — M109 Gout, unspecified: Secondary | ICD-10-CM | POA: Diagnosis not present

## 2023-02-06 DIAGNOSIS — I69351 Hemiplegia and hemiparesis following cerebral infarction affecting right dominant side: Secondary | ICD-10-CM | POA: Diagnosis not present

## 2023-02-06 DIAGNOSIS — I2510 Atherosclerotic heart disease of native coronary artery without angina pectoris: Secondary | ICD-10-CM | POA: Diagnosis not present

## 2023-02-08 DIAGNOSIS — M109 Gout, unspecified: Secondary | ICD-10-CM | POA: Diagnosis not present

## 2023-02-08 DIAGNOSIS — E1122 Type 2 diabetes mellitus with diabetic chronic kidney disease: Secondary | ICD-10-CM | POA: Diagnosis not present

## 2023-02-08 DIAGNOSIS — I5041 Acute combined systolic (congestive) and diastolic (congestive) heart failure: Secondary | ICD-10-CM | POA: Diagnosis not present

## 2023-02-08 DIAGNOSIS — I130 Hypertensive heart and chronic kidney disease with heart failure and stage 1 through stage 4 chronic kidney disease, or unspecified chronic kidney disease: Secondary | ICD-10-CM | POA: Diagnosis not present

## 2023-02-08 DIAGNOSIS — N390 Urinary tract infection, site not specified: Secondary | ICD-10-CM | POA: Diagnosis not present

## 2023-02-08 DIAGNOSIS — E039 Hypothyroidism, unspecified: Secondary | ICD-10-CM | POA: Diagnosis not present

## 2023-02-08 DIAGNOSIS — D631 Anemia in chronic kidney disease: Secondary | ICD-10-CM | POA: Diagnosis not present

## 2023-02-08 DIAGNOSIS — Z9181 History of falling: Secondary | ICD-10-CM | POA: Diagnosis not present

## 2023-02-08 DIAGNOSIS — J9600 Acute respiratory failure, unspecified whether with hypoxia or hypercapnia: Secondary | ICD-10-CM | POA: Diagnosis not present

## 2023-02-08 DIAGNOSIS — Z86718 Personal history of other venous thrombosis and embolism: Secondary | ICD-10-CM | POA: Diagnosis not present

## 2023-02-08 DIAGNOSIS — Z6841 Body Mass Index (BMI) 40.0 and over, adult: Secondary | ICD-10-CM | POA: Diagnosis not present

## 2023-02-08 DIAGNOSIS — I8390 Asymptomatic varicose veins of unspecified lower extremity: Secondary | ICD-10-CM | POA: Diagnosis not present

## 2023-02-08 DIAGNOSIS — I482 Chronic atrial fibrillation, unspecified: Secondary | ICD-10-CM | POA: Diagnosis not present

## 2023-02-08 DIAGNOSIS — N1831 Chronic kidney disease, stage 3a: Secondary | ICD-10-CM | POA: Diagnosis not present

## 2023-02-08 DIAGNOSIS — I69351 Hemiplegia and hemiparesis following cerebral infarction affecting right dominant side: Secondary | ICD-10-CM | POA: Diagnosis not present

## 2023-02-08 DIAGNOSIS — I2510 Atherosclerotic heart disease of native coronary artery without angina pectoris: Secondary | ICD-10-CM | POA: Diagnosis not present

## 2023-02-08 DIAGNOSIS — Z7901 Long term (current) use of anticoagulants: Secondary | ICD-10-CM | POA: Diagnosis not present

## 2023-02-13 DIAGNOSIS — N1831 Chronic kidney disease, stage 3a: Secondary | ICD-10-CM | POA: Diagnosis not present

## 2023-02-13 DIAGNOSIS — E1122 Type 2 diabetes mellitus with diabetic chronic kidney disease: Secondary | ICD-10-CM | POA: Diagnosis not present

## 2023-02-13 DIAGNOSIS — M109 Gout, unspecified: Secondary | ICD-10-CM | POA: Diagnosis not present

## 2023-02-13 DIAGNOSIS — N390 Urinary tract infection, site not specified: Secondary | ICD-10-CM | POA: Diagnosis not present

## 2023-02-13 DIAGNOSIS — I8390 Asymptomatic varicose veins of unspecified lower extremity: Secondary | ICD-10-CM | POA: Diagnosis not present

## 2023-02-13 DIAGNOSIS — I482 Chronic atrial fibrillation, unspecified: Secondary | ICD-10-CM | POA: Diagnosis not present

## 2023-02-13 DIAGNOSIS — I130 Hypertensive heart and chronic kidney disease with heart failure and stage 1 through stage 4 chronic kidney disease, or unspecified chronic kidney disease: Secondary | ICD-10-CM | POA: Diagnosis not present

## 2023-02-13 DIAGNOSIS — I69351 Hemiplegia and hemiparesis following cerebral infarction affecting right dominant side: Secondary | ICD-10-CM | POA: Diagnosis not present

## 2023-02-13 DIAGNOSIS — D631 Anemia in chronic kidney disease: Secondary | ICD-10-CM | POA: Diagnosis not present

## 2023-02-13 DIAGNOSIS — Z86718 Personal history of other venous thrombosis and embolism: Secondary | ICD-10-CM | POA: Diagnosis not present

## 2023-02-13 DIAGNOSIS — I2510 Atherosclerotic heart disease of native coronary artery without angina pectoris: Secondary | ICD-10-CM | POA: Diagnosis not present

## 2023-02-13 DIAGNOSIS — I5041 Acute combined systolic (congestive) and diastolic (congestive) heart failure: Secondary | ICD-10-CM | POA: Diagnosis not present

## 2023-02-13 DIAGNOSIS — Z6841 Body Mass Index (BMI) 40.0 and over, adult: Secondary | ICD-10-CM | POA: Diagnosis not present

## 2023-02-13 DIAGNOSIS — J9600 Acute respiratory failure, unspecified whether with hypoxia or hypercapnia: Secondary | ICD-10-CM | POA: Diagnosis not present

## 2023-02-13 DIAGNOSIS — Z9181 History of falling: Secondary | ICD-10-CM | POA: Diagnosis not present

## 2023-02-13 DIAGNOSIS — E039 Hypothyroidism, unspecified: Secondary | ICD-10-CM | POA: Diagnosis not present

## 2023-02-13 DIAGNOSIS — Z7901 Long term (current) use of anticoagulants: Secondary | ICD-10-CM | POA: Diagnosis not present

## 2023-02-15 DIAGNOSIS — I2510 Atherosclerotic heart disease of native coronary artery without angina pectoris: Secondary | ICD-10-CM | POA: Diagnosis not present

## 2023-02-15 DIAGNOSIS — M109 Gout, unspecified: Secondary | ICD-10-CM | POA: Diagnosis not present

## 2023-02-15 DIAGNOSIS — N390 Urinary tract infection, site not specified: Secondary | ICD-10-CM | POA: Diagnosis not present

## 2023-02-15 DIAGNOSIS — Z86718 Personal history of other venous thrombosis and embolism: Secondary | ICD-10-CM | POA: Diagnosis not present

## 2023-02-15 DIAGNOSIS — D631 Anemia in chronic kidney disease: Secondary | ICD-10-CM | POA: Diagnosis not present

## 2023-02-15 DIAGNOSIS — I5041 Acute combined systolic (congestive) and diastolic (congestive) heart failure: Secondary | ICD-10-CM | POA: Diagnosis not present

## 2023-02-15 DIAGNOSIS — Z7901 Long term (current) use of anticoagulants: Secondary | ICD-10-CM | POA: Diagnosis not present

## 2023-02-15 DIAGNOSIS — E1122 Type 2 diabetes mellitus with diabetic chronic kidney disease: Secondary | ICD-10-CM | POA: Diagnosis not present

## 2023-02-15 DIAGNOSIS — I482 Chronic atrial fibrillation, unspecified: Secondary | ICD-10-CM | POA: Diagnosis not present

## 2023-02-15 DIAGNOSIS — Z6841 Body Mass Index (BMI) 40.0 and over, adult: Secondary | ICD-10-CM | POA: Diagnosis not present

## 2023-02-15 DIAGNOSIS — Z9181 History of falling: Secondary | ICD-10-CM | POA: Diagnosis not present

## 2023-02-15 DIAGNOSIS — E039 Hypothyroidism, unspecified: Secondary | ICD-10-CM | POA: Diagnosis not present

## 2023-02-15 DIAGNOSIS — I8390 Asymptomatic varicose veins of unspecified lower extremity: Secondary | ICD-10-CM | POA: Diagnosis not present

## 2023-02-15 DIAGNOSIS — I69351 Hemiplegia and hemiparesis following cerebral infarction affecting right dominant side: Secondary | ICD-10-CM | POA: Diagnosis not present

## 2023-02-15 DIAGNOSIS — J9600 Acute respiratory failure, unspecified whether with hypoxia or hypercapnia: Secondary | ICD-10-CM | POA: Diagnosis not present

## 2023-02-15 DIAGNOSIS — N1831 Chronic kidney disease, stage 3a: Secondary | ICD-10-CM | POA: Diagnosis not present

## 2023-02-15 DIAGNOSIS — I130 Hypertensive heart and chronic kidney disease with heart failure and stage 1 through stage 4 chronic kidney disease, or unspecified chronic kidney disease: Secondary | ICD-10-CM | POA: Diagnosis not present

## 2023-02-22 DIAGNOSIS — D631 Anemia in chronic kidney disease: Secondary | ICD-10-CM | POA: Diagnosis not present

## 2023-02-22 DIAGNOSIS — Z86718 Personal history of other venous thrombosis and embolism: Secondary | ICD-10-CM | POA: Diagnosis not present

## 2023-02-22 DIAGNOSIS — N1831 Chronic kidney disease, stage 3a: Secondary | ICD-10-CM | POA: Diagnosis not present

## 2023-02-22 DIAGNOSIS — Z6841 Body Mass Index (BMI) 40.0 and over, adult: Secondary | ICD-10-CM | POA: Diagnosis not present

## 2023-02-22 DIAGNOSIS — I5041 Acute combined systolic (congestive) and diastolic (congestive) heart failure: Secondary | ICD-10-CM | POA: Diagnosis not present

## 2023-02-22 DIAGNOSIS — I8390 Asymptomatic varicose veins of unspecified lower extremity: Secondary | ICD-10-CM | POA: Diagnosis not present

## 2023-02-22 DIAGNOSIS — M109 Gout, unspecified: Secondary | ICD-10-CM | POA: Diagnosis not present

## 2023-02-22 DIAGNOSIS — Z9181 History of falling: Secondary | ICD-10-CM | POA: Diagnosis not present

## 2023-02-22 DIAGNOSIS — Z7901 Long term (current) use of anticoagulants: Secondary | ICD-10-CM | POA: Diagnosis not present

## 2023-02-22 DIAGNOSIS — N390 Urinary tract infection, site not specified: Secondary | ICD-10-CM | POA: Diagnosis not present

## 2023-02-22 DIAGNOSIS — E039 Hypothyroidism, unspecified: Secondary | ICD-10-CM | POA: Diagnosis not present

## 2023-02-22 DIAGNOSIS — I2510 Atherosclerotic heart disease of native coronary artery without angina pectoris: Secondary | ICD-10-CM | POA: Diagnosis not present

## 2023-02-22 DIAGNOSIS — E1122 Type 2 diabetes mellitus with diabetic chronic kidney disease: Secondary | ICD-10-CM | POA: Diagnosis not present

## 2023-02-22 DIAGNOSIS — I69351 Hemiplegia and hemiparesis following cerebral infarction affecting right dominant side: Secondary | ICD-10-CM | POA: Diagnosis not present

## 2023-02-22 DIAGNOSIS — I130 Hypertensive heart and chronic kidney disease with heart failure and stage 1 through stage 4 chronic kidney disease, or unspecified chronic kidney disease: Secondary | ICD-10-CM | POA: Diagnosis not present

## 2023-02-22 DIAGNOSIS — I482 Chronic atrial fibrillation, unspecified: Secondary | ICD-10-CM | POA: Diagnosis not present

## 2023-02-22 DIAGNOSIS — J9600 Acute respiratory failure, unspecified whether with hypoxia or hypercapnia: Secondary | ICD-10-CM | POA: Diagnosis not present

## 2023-03-05 DIAGNOSIS — R300 Dysuria: Secondary | ICD-10-CM | POA: Diagnosis not present

## 2023-03-06 DIAGNOSIS — I5022 Chronic systolic (congestive) heart failure: Secondary | ICD-10-CM | POA: Diagnosis not present

## 2023-03-06 DIAGNOSIS — I480 Paroxysmal atrial fibrillation: Secondary | ICD-10-CM | POA: Diagnosis not present

## 2023-03-06 DIAGNOSIS — I340 Nonrheumatic mitral (valve) insufficiency: Secondary | ICD-10-CM | POA: Diagnosis not present

## 2023-03-06 DIAGNOSIS — I5023 Acute on chronic systolic (congestive) heart failure: Secondary | ICD-10-CM | POA: Diagnosis not present

## 2023-03-13 DIAGNOSIS — I480 Paroxysmal atrial fibrillation: Secondary | ICD-10-CM | POA: Diagnosis not present

## 2023-03-13 DIAGNOSIS — I5022 Chronic systolic (congestive) heart failure: Secondary | ICD-10-CM | POA: Diagnosis not present

## 2023-03-13 DIAGNOSIS — I340 Nonrheumatic mitral (valve) insufficiency: Secondary | ICD-10-CM | POA: Diagnosis not present

## 2023-03-14 DIAGNOSIS — I4821 Permanent atrial fibrillation: Secondary | ICD-10-CM | POA: Diagnosis not present

## 2023-03-14 DIAGNOSIS — E7849 Other hyperlipidemia: Secondary | ICD-10-CM | POA: Diagnosis not present

## 2023-03-14 DIAGNOSIS — Z79899 Other long term (current) drug therapy: Secondary | ICD-10-CM | POA: Diagnosis not present

## 2023-03-14 DIAGNOSIS — E1165 Type 2 diabetes mellitus with hyperglycemia: Secondary | ICD-10-CM | POA: Diagnosis not present

## 2023-03-14 DIAGNOSIS — D649 Anemia, unspecified: Secondary | ICD-10-CM | POA: Diagnosis not present

## 2023-03-14 DIAGNOSIS — E034 Atrophy of thyroid (acquired): Secondary | ICD-10-CM | POA: Diagnosis not present

## 2023-03-14 DIAGNOSIS — E786 Lipoprotein deficiency: Secondary | ICD-10-CM | POA: Diagnosis not present

## 2023-03-20 DIAGNOSIS — I5022 Chronic systolic (congestive) heart failure: Secondary | ICD-10-CM | POA: Diagnosis not present

## 2023-03-20 DIAGNOSIS — I340 Nonrheumatic mitral (valve) insufficiency: Secondary | ICD-10-CM | POA: Diagnosis not present

## 2023-03-20 DIAGNOSIS — I4821 Permanent atrial fibrillation: Secondary | ICD-10-CM | POA: Diagnosis not present

## 2023-03-23 DIAGNOSIS — Z1231 Encounter for screening mammogram for malignant neoplasm of breast: Secondary | ICD-10-CM | POA: Diagnosis not present

## 2023-03-28 DIAGNOSIS — Z1231 Encounter for screening mammogram for malignant neoplasm of breast: Secondary | ICD-10-CM | POA: Diagnosis not present

## 2023-05-22 DIAGNOSIS — I4821 Permanent atrial fibrillation: Secondary | ICD-10-CM | POA: Diagnosis not present

## 2023-05-22 DIAGNOSIS — I5022 Chronic systolic (congestive) heart failure: Secondary | ICD-10-CM | POA: Diagnosis not present

## 2023-05-22 DIAGNOSIS — I340 Nonrheumatic mitral (valve) insufficiency: Secondary | ICD-10-CM | POA: Diagnosis not present

## 2023-05-29 DIAGNOSIS — I130 Hypertensive heart and chronic kidney disease with heart failure and stage 1 through stage 4 chronic kidney disease, or unspecified chronic kidney disease: Secondary | ICD-10-CM | POA: Diagnosis not present

## 2023-05-29 DIAGNOSIS — N1831 Chronic kidney disease, stage 3a: Secondary | ICD-10-CM | POA: Diagnosis not present

## 2023-05-29 DIAGNOSIS — Z809 Family history of malignant neoplasm, unspecified: Secondary | ICD-10-CM | POA: Diagnosis not present

## 2023-05-29 DIAGNOSIS — Z008 Encounter for other general examination: Secondary | ICD-10-CM | POA: Diagnosis not present

## 2023-05-29 DIAGNOSIS — E785 Hyperlipidemia, unspecified: Secondary | ICD-10-CM | POA: Diagnosis not present

## 2023-05-29 DIAGNOSIS — D6869 Other thrombophilia: Secondary | ICD-10-CM | POA: Diagnosis not present

## 2023-05-29 DIAGNOSIS — I69351 Hemiplegia and hemiparesis following cerebral infarction affecting right dominant side: Secondary | ICD-10-CM | POA: Diagnosis not present

## 2023-05-29 DIAGNOSIS — I501 Left ventricular failure: Secondary | ICD-10-CM | POA: Diagnosis not present

## 2023-05-29 DIAGNOSIS — I429 Cardiomyopathy, unspecified: Secondary | ICD-10-CM | POA: Diagnosis not present

## 2023-05-29 DIAGNOSIS — Z87891 Personal history of nicotine dependence: Secondary | ICD-10-CM | POA: Diagnosis not present

## 2023-05-29 DIAGNOSIS — I4891 Unspecified atrial fibrillation: Secondary | ICD-10-CM | POA: Diagnosis not present

## 2023-05-29 DIAGNOSIS — M1990 Unspecified osteoarthritis, unspecified site: Secondary | ICD-10-CM | POA: Diagnosis not present

## 2023-06-27 DIAGNOSIS — R928 Other abnormal and inconclusive findings on diagnostic imaging of breast: Secondary | ICD-10-CM | POA: Diagnosis not present

## 2023-06-29 DIAGNOSIS — N6315 Unspecified lump in the right breast, overlapping quadrants: Secondary | ICD-10-CM | POA: Diagnosis not present

## 2023-07-06 DIAGNOSIS — R928 Other abnormal and inconclusive findings on diagnostic imaging of breast: Secondary | ICD-10-CM | POA: Diagnosis not present

## 2023-07-06 DIAGNOSIS — N6315 Unspecified lump in the right breast, overlapping quadrants: Secondary | ICD-10-CM | POA: Diagnosis not present

## 2023-07-06 DIAGNOSIS — N6341 Unspecified lump in right breast, subareolar: Secondary | ICD-10-CM | POA: Diagnosis not present

## 2023-07-11 NOTE — Progress Notes (Signed)
 Initial phone contact with newly diagnosed breast cancer patient. Names of local surgeons given. Pt to research and call back with her choice. Navigation contact info given and encouraged pt to call with questions or concerns.

## 2023-07-12 NOTE — Progress Notes (Signed)
 Phone contact with pt. Pt requests to go to [facility] for surgery. New pt referral sent to [facility] in [place].

## 2023-07-14 DIAGNOSIS — C50919 Malignant neoplasm of unspecified site of unspecified female breast: Secondary | ICD-10-CM

## 2023-07-14 HISTORY — DX: Malignant neoplasm of unspecified site of unspecified female breast: C50.919

## 2023-07-18 DIAGNOSIS — Z170 Estrogen receptor positive status [ER+]: Secondary | ICD-10-CM | POA: Diagnosis not present

## 2023-07-18 DIAGNOSIS — C50411 Malignant neoplasm of upper-outer quadrant of right female breast: Secondary | ICD-10-CM | POA: Diagnosis not present

## 2023-07-19 ENCOUNTER — Telehealth: Payer: Self-pay | Admitting: Hematology and Oncology

## 2023-07-19 NOTE — Telephone Encounter (Signed)
 patient called to reschedule appt. Xfer patient to Radiation

## 2023-07-25 ENCOUNTER — Inpatient Hospital Stay: Attending: Hematology and Oncology | Admitting: Hematology and Oncology

## 2023-07-25 VITALS — BP 106/57 | HR 94 | Temp 97.9°F | Resp 17 | Ht 67.0 in | Wt 286.9 lb

## 2023-07-25 DIAGNOSIS — Z87891 Personal history of nicotine dependence: Secondary | ICD-10-CM

## 2023-07-25 DIAGNOSIS — I4891 Unspecified atrial fibrillation: Secondary | ICD-10-CM

## 2023-07-25 DIAGNOSIS — C50411 Malignant neoplasm of upper-outer quadrant of right female breast: Secondary | ICD-10-CM

## 2023-07-25 DIAGNOSIS — Z170 Estrogen receptor positive status [ER+]: Secondary | ICD-10-CM | POA: Diagnosis not present

## 2023-07-25 DIAGNOSIS — E039 Hypothyroidism, unspecified: Secondary | ICD-10-CM

## 2023-07-25 DIAGNOSIS — Z86718 Personal history of other venous thrombosis and embolism: Secondary | ICD-10-CM

## 2023-07-25 DIAGNOSIS — I110 Hypertensive heart disease with heart failure: Secondary | ICD-10-CM

## 2023-07-25 NOTE — Assessment & Plan Note (Signed)
 2/25/2025[facility]:Screening mammogram detected right breast mass 7 mm retroareolar, axilla negative, biopsy: Grade 2 IDC with apocrine features, LVI not identified, ER 100%, PR 100%, Ki67 10%, HER2 1+ IHC negative Prior history of stroke  Pathology and radiology counseling:Discussed with the patient, the details of pathology including the type of breast cancer,the clinical staging, the significance of ER, PR and HER-2/neu receptors and the implications for treatment. After reviewing the pathology in detail, we proceeded to discuss the different treatment options between surgery, radiation, chemotherapy, antiestrogen therapies.  Recommendations: 1. Breast conserving surgery followed by 2. +/-Oncotype DX testing to determine if chemotherapy would be of any benefit followed by 3. +/- Adjuvant radiation therapy followed by 4. Adjuvant antiestrogen therapy  Oncotype counseling: I discussed Oncotype DX test. I explained to the patient that this is a 21 gene panel to evaluate patient tumors DNA to calculate recurrence score. This would help determine whether patient has high risk or low risk breast cancer. She understands that if her tumor was found to be high risk, she would benefit from systemic chemotherapy. If low risk, no need of chemotherapy.  Return to clinic after surgery to discuss final pathology report and then determine if Oncotype DX testing will need to be sent.

## 2023-07-25 NOTE — Progress Notes (Signed)
 Radiation Oncology         ([phone]) [phone] ________________________________  Name: Candice Perez        MRN: [id number]  Date of Service: 07/31/2023 DOB: 08-Sep-1950  VO:HYWV, Shea Evans, MD  Griselda Miner, MD     REFERRING PHYSICIAN: [name], MD   DIAGNOSIS: The encounter diagnosis was Malignant neoplasm of upper-outer quadrant of right breast in female, estrogen receptor positive (HCC).   HISTORY OF PRESENT ILLNESS: Candice Perez is a 73 y.o. female seen at the request of Dr. Pamelia Hoit for a new diagnosis of right breast cancer.  The patient went for screening mammogram in November 2024 where a possible mass in the right breast was identified.  She underwent further diagnostic workup that included ultrasound that showed a 6 mm nodule in the retroareolar aspect of the right breast in the 3 o'clock position, clinically her axilla was apparently normal but this was not noted in the report. A biopsy on 07/06/2023 showed a grade 2 invasive ductal carcinoma with apocrine differentiation, her cancer was ER/PR positive, HER2 negative with a Ki-67 of 10%.  She met with Dr. Carolynne Edouard who discussed her findings, and recommends lumpectomy.  He does not plan to further evaluate the axilla at the time of surgery. She's contacted today to discuss adjuvant radiotherapy.    PREVIOUS RADIATION THERAPY: No   PAST MEDICAL HISTORY:  Past Medical History:  Diagnosis Date   A-fib (HCC)    Arthritis    Breast cancer (HCC) 07/2023   DVT (deep venous thrombosis) (HCC)    Edema    GERD (gastroesophageal reflux disease)    OCCASIONAL - PRILOSEC IF NEEDED   Hyperlipidemia    Hypertension    no meds now   Hypothyroidism    Varicose veins        PAST SURGICAL HISTORY: Past Surgical History:  Procedure Laterality Date   CHOLECYSTECTOMY     Laparoscopic   CYSTOCELE REPAIR     HERNIA REPAIR     IR CT HEAD LTD  12/02/2020   IR PERCUTANEOUS ART THROMBECTOMY/INFUSION INTRACRANIAL INC DIAG ANGIO  12/02/2020    RADIOLOGY WITH ANESTHESIA N/A 12/02/2020   Procedure: IR WITH ANESTHESIA;  Surgeon: Julieanne Cotton, MD;  Location: [facility] OR;  Service: Radiology;  Laterality: N/A;   RIGHT/LEFT HEART CATH AND CORONARY ANGIOGRAPHY N/A 11/20/2022   Procedure: RIGHT/LEFT HEART CATH AND CORONARY ANGIOGRAPHY;  Surgeon: [name], MD;  Location: [facility];  Service: Cardiovascular;  Laterality: N/A;   TONSILLECTOMY     TOTAL KNEE ARTHROPLASTY Left 08/26/2013   Procedure: LEFT TOTAL KNEE REVISION;  Surgeon: Shelda Pal, MD;  Location: [facility];  Service: Orthopedics;  Laterality: Left;   VARICOSE VEIN SURGERY       FAMILY HISTORY:  Family History  Problem Relation Age of Onset   Congestive Heart Failure Mother    Cancer Mother        tongue/sinus   Cancer Father        "in his back"   Other Father        Varicose veins, Arteriosclerosis   Breast cancer Sister      SOCIAL HISTORY:  reports that she quit smoking about 12 years ago. Her smoking use included cigarettes. She started smoking about 52 years ago. She has never used smokeless tobacco. She reports that she does not drink alcohol and does not use drugs.  The patient is married and lives in [place].  She is accompanied  by her husband and son on the call.    ALLERGIES: Patient has no known allergies.   MEDICATIONS:  Current Outpatient Medications  Medication Sig Dispense Refill   acetaminophen (TYLENOL) 650 MG CR tablet Take by mouth.     allopurinol (ZYLOPRIM) 100 MG tablet Take 1 tablet (100 mg total) by mouth daily. 30 tablet 3   amLODipine (NORVASC) 10 MG tablet Take by mouth.     apixaban (ELIQUIS) 5 MG TABS tablet Take 1 tablet (5 mg total) by mouth 2 (two) times daily. 60 tablet 0   Ascorbic Acid (VITAMIN C) 500 MG CAPS      carvedilol (COREG) 12.5 MG tablet Take 1 tablet (12.5 mg total) by mouth 2 (two) times daily with a meal. 180 tablet 1   Cholecalciferol (VITAMIN D3) 1000 units CAPS      cyanocobalamin (VITAMIN B12)  1000 MCG tablet Take by mouth.     digoxin (LANOXIN) 0.125 MG tablet Take 0.5 tablets (0.0625 mg total) by mouth daily. 90 tablet 3   diltiazem (CARDIZEM CD) 120 MG 24 hr capsule Take 1 capsule (120 mg total) by mouth daily. 90 capsule 1   gabapentin (NEURONTIN) 400 MG capsule Take 1 capsule (400 mg total) by mouth at bedtime. 90 capsule 3   hydrALAZINE (APRESOLINE) 25 MG tablet Take by mouth.     KLOR-CON M20 20 MEQ tablet Take 20 mEq by mouth daily.     levothyroxine (SYNTHROID) 125 MCG tablet Take 1 tablet (125 mcg total) by mouth daily at 6 (six) AM. 30 tablet 3   Magnesium Oxide -Mg Supplement 250 MG TABS Take by mouth.     Multiple Vitamins-Minerals (MULTIVITAMIN WOMEN) TABS      rosuvastatin (CRESTOR) 20 MG tablet Take 1 tablet (20 mg total) by mouth daily. 30 tablet 0   TART CHERRY PO Take 500 mg by mouth 3 (three) times daily.     torsemide (DEMADEX) 20 MG tablet Take 20 mg by mouth daily.     Zinc 50 MG TABS      No current facility-administered medications for this encounter.     REVIEW OF SYSTEMS: On review of systems, the patient reports that she is doing okay overall. She does have trouble with her speech as a result of her prior stroke. She cannot grip things in her right hand, and struggles with writing and using her right side for any fine motor skills. No breast specific complaints are verbalized.      PHYSICAL EXAM:  Wt Readings from Last 3 Encounters:  07/31/23 286 lb (129.7 kg)  07/25/23 286 lb 14.4 oz (130.1 kg)  11/24/22 297 lb 2.9 oz (134.8 kg)   Temp Readings from Last 3 Encounters:  07/25/23 97.9 F (36.6 C) (Temporal)  11/24/22 98 F (36.7 C) (Oral)  08/19/21 98 F (36.7 C) (Oral)   BP Readings from Last 3 Encounters:  07/25/23 (!) 106/57  11/24/22 95/64  09/26/22 137/74   Pulse Readings from Last 3 Encounters:  07/25/23 94  11/24/22 71  09/26/22 62    In general this is a well appearing caucasian female in no acute distress. She's alert and  oriented x4 and appropriate throughout the examination. Cardiopulmonary assessment is negative for acute distress and she exhibits normal effort. Bilateral breast exam is deferred.    ECOG = 1  0 - Asymptomatic (Fully active, able to carry on all predisease activities without restriction)  1 - Symptomatic but completely ambulatory (Restricted in physically strenuous activity  but ambulatory and able to carry out work of a light or sedentary nature. For example, light housework, office work)  2 - Symptomatic, <50% in bed during the day (Ambulatory and capable of all self care but unable to carry out any work activities. Up and about more than 50% of waking hours)  3 - Symptomatic, >50% in bed, but not bedbound (Capable of only limited self-care, confined to bed or chair 50% or more of waking hours)  4 - Bedbound (Completely disabled. Cannot carry on any self-care. Totally confined to bed or chair)  5 - Death   Santiago Glad MM, Creech RH, Tormey DC, et al. [phone]). "Toxicity and response criteria of the [facility]". Am. Evlyn Clines. Oncol. 5 (6): 649-55    LABORATORY DATA:  Lab Results  Component Value Date   WBC 6.1 11/23/2022   HGB 11.7 (L) 11/23/2022   HCT 37.3 11/23/2022   MCV 92.1 11/23/2022   PLT 261 11/23/2022   Lab Results  Component Value Date   NA 143 11/23/2022   K 3.9 11/23/2022   CL 101 11/23/2022   CO2 28 11/23/2022   Lab Results  Component Value Date   ALT 89 (H) 11/23/2022   AST 67 (H) 11/23/2022   ALKPHOS 61 11/23/2022   BILITOT 0.5 11/23/2022      RADIOGRAPHY: No results found.     IMPRESSION/PLAN: 1. Stage IA, cT1bN0M0, grade 2, ER/PR positive invasive ductal carcinoma of the right breast. Dr. Mitzi Hansen discusses the pathology findings and reviews the nature of early stage right breast disease.  She is planning upcoming breast conservation with lumpectomy. Dr. Pamelia Hoit does not plan Oncotype Dx given her history of stroke and CHF. Dr. Mitzi Hansen  discusses uses of external radiotherapy to the breast  to reduce risks of local recurrence. Dr. Pamelia Hoit anticipates adjuvant antiestrogen therapy to follow. We discussed the risks, benefits, short, and long term effects of radiotherapy, as well as the curative intent, and the patient is interested in proceeding. Dr. Mitzi Hansen discusses the delivery and logistics of radiotherapy and anticipates a course of 4  weeks of radiotherapy. We also discussed alternatives including ultrahypofractionation, and the possibility of radiation being optional but determined by final pathology.  She lives in [place] and desires treatment if necessary closer to home and requests referral to meet with Dr. Thersa Salt which will be placed today.     This encounter was provided by telemedicine platform MyChart.  The patient has provided two factor identification and has given verbal consent for this type of encounter and has been advised to only accept a meeting of this type in a secure network environment. The time spent during this encounter was 60 minutes including preparation, discussion, and coordination of the patient's care. The attendants for this meeting include Rober Minion, RN, Dr. Mitzi Hansen, Ronny Bacon  and Gilford Rile and her son Italy Vincent and husband Nava Song. During the encounter,  Rober Minion, RN, Dr. Mitzi Hansen, and Ronny Bacon were located at [facility] Radiation Oncology Department.  LORIANA SAMAD was located at home with her son Italy Steinmetz and husband Addysen Louth.    The above documentation reflects my direct findings during this shared patient visit. Please see the separate note by Dr. Mitzi Hansen on this date for the remainder of the patient's plan of care.    Osker Mason, [facility]    **Disclaimer: This note was dictated with voice recognition software. Similar sounding words can inadvertently be  transcribed and this note may contain transcription errors which  may not have been corrected upon publication of note.**

## 2023-07-25 NOTE — Progress Notes (Signed)
 [facility] CONSULT NOTE  Patient Care Team: Marylen Ponto, MD as PCP - General (Family Medicine) Charlyne Petrin, RN as Nurse Navigator  CHIEF COMPLAINTS/PURPOSE OF CONSULTATION:  Newly diagnosed breast cancer  HISTORY OF PRESENTING ILLNESS:   Candice Perez is a 73 year old with recently diagnosed breast cancer.  She had routine screening mammogram that detected a right breast mass measuring 7 mm in the retroareolar region.  Axilla was negative.  Biopsy revealed grade 2 invasive ductal carcinoma with apocrine features ER/PR positive HER2 negative with a Ki-67 of 10%.  She is here today accompanied by her son.  She had a previous history of stroke and atrial fibrillation and congestive heart failure.  She is able to walk with the help of a cane.  She is here today to discuss her treatment plan for her breast cancer.   I reviewed her records extensively and collaborated the history with the patient.  SUMMARY OF ONCOLOGIC HISTORY: Oncology History  Malignant neoplasm of upper-outer quadrant of right breast in female, estrogen receptor positive (HCC)  07/10/2023 Initial Diagnosis   Screening mammogram detected right breast mass 7 mm retroareolar, axilla negative, biopsy: Grade 2 IDC with apocrine features, LVI not identified, ER 100%, PR 100%, Ki67 10%, HER2 1+ IHC negative   07/25/2023 Cancer Staging   Staging form: Breast, AJCC 8th Edition - Clinical: Stage IA (cT1b, cN0, cM0, G2, ER+, PR+, HER2-) - Signed by Serena Croissant, MD on 07/25/2023 Histologic grading system: 3 grade system      MEDICAL HISTORY:  Past Medical History:  Diagnosis Date   A-fib (HCC)    Arthritis    DVT (deep venous thrombosis) (HCC)    Edema    GERD (gastroesophageal reflux disease)    OCCASIONAL - PRILOSEC IF NEEDED   Hyperlipidemia    Hypertension    no meds now   Hypothyroidism    Varicose veins     SURGICAL HISTORY: Past Surgical History:  Procedure Laterality Date   CHOLECYSTECTOMY      Laparoscopic   CYSTOCELE REPAIR     HERNIA REPAIR     IR CT HEAD LTD  12/02/2020   IR PERCUTANEOUS ART THROMBECTOMY/INFUSION INTRACRANIAL INC DIAG ANGIO  12/02/2020   RADIOLOGY WITH ANESTHESIA N/A 12/02/2020   Procedure: IR WITH ANESTHESIA;  Surgeon: Julieanne Cotton, MD;  Location: [facility] OR;  Service: Radiology;  Laterality: N/A;   RIGHT/LEFT HEART CATH AND CORONARY ANGIOGRAPHY N/A 11/20/2022   Procedure: RIGHT/LEFT HEART CATH AND CORONARY ANGIOGRAPHY;  Surgeon: [name], MD;  Location: [facility] INV[facility];  Service: Cardiovascular;  Laterality: N/A;   TONSILLECTOMY     TOTAL KNEE ARTHROPLASTY Left 08/26/2013   Procedure: LEFT TOTAL KNEE REVISION;  Surgeon: Shelda Pal, MD;  Location: [facility];  Service: Orthopedics;  Laterality: Left;   VARICOSE VEIN SURGERY      SOCIAL HISTORY: Social History   Socioeconomic History   Marital status: Married    Spouse name: Not on file   Number of children: 2   Years of education: 9   Highest education level: Not on file  Occupational History   Occupation: Retired  Tobacco Use   Smoking status: Former    Current packs/day: 0.00    Types: Cigarettes    Start date: 09/17/1970    Quit date: 09/17/2010    Years since quitting: 12.8   Smokeless tobacco: Never  Vaping Use   Vaping status: Never Used  Substance and Sexual Activity   Alcohol use:  No   Drug use: No   Sexual activity: Not on file  Other Topics Concern   Not on file  Social History Narrative   Lives at home with her husband.   Right-handed.   No daily caffeine use.   Social Drivers of Corporate investment banker Strain: Not on file  Food Insecurity: No Food Insecurity (11/15/2022)   Hunger Vital Sign    Worried About Running Out of Food in the Last Year: Never true    Ran Out of Food in the Last Year: Never true  Transportation Needs: No Transportation Needs (11/15/2022)   PRAPARE - Administrator, Civil Service (Medical): No    Lack of Transportation  (Non-Medical): No  Physical Activity: Not on file  Stress: Not on file  Social Connections: Not on file  Intimate Partner Violence: Not At Risk (11/15/2022)   Humiliation, Afraid, Rape, and Kick questionnaire    Fear of Current or Ex-Partner: No    Emotionally Abused: No    Physically Abused: No    Sexually Abused: No    FAMILY HISTORY: Family History  Problem Relation Age of Onset   Congestive Heart Failure Mother    Cancer Mother        tongue/sinus   Cancer Father        "in his back"   Other Father        Varicose veins, Arteriosclerosis    ALLERGIES:  has no known allergies.  MEDICATIONS:  Current Outpatient Medications  Medication Sig Dispense Refill   allopurinol (ZYLOPRIM) 100 MG tablet Take 1 tablet (100 mg total) by mouth daily. 30 tablet 3   apixaban (ELIQUIS) 5 MG TABS tablet Take 1 tablet (5 mg total) by mouth 2 (two) times daily. 60 tablet 0   carvedilol (COREG) 12.5 MG tablet Take 1 tablet (12.5 mg total) by mouth 2 (two) times daily with a meal. 180 tablet 1   ciprofloxacin (CIPRO) 250 MG tablet Take 1 tablet (250 mg total) by mouth 2 (two) times daily. 8 tablet 0   colchicine 0.6 MG tablet Take 0.5 tablets (0.3 mg total) by mouth daily. 30 tablet 1   digoxin (LANOXIN) 0.125 MG tablet Take 0.5 tablets (0.0625 mg total) by mouth daily. 90 tablet 3   diltiazem (CARDIZEM CD) 120 MG 24 hr capsule Take 1 capsule (120 mg total) by mouth daily. 90 capsule 1   gabapentin (NEURONTIN) 400 MG capsule Take 1 capsule (400 mg total) by mouth at bedtime. 90 capsule 3   KLOR-CON M20 20 MEQ tablet Take 20 mEq by mouth daily.     levothyroxine (SYNTHROID) 125 MCG tablet Take 1 tablet (125 mcg total) by mouth daily at 6 (six) AM. 30 tablet 3   rosuvastatin (CRESTOR) 20 MG tablet Take 1 tablet (20 mg total) by mouth daily. 30 tablet 0   TART CHERRY PO Take 500 mg by mouth 3 (three) times daily.     torsemide (DEMADEX) 20 MG tablet Take 20 mg by mouth daily.     No current  facility-administered medications for this visit.    REVIEW OF SYSTEMS:   Constitutional: Denies fevers, chills or abnormal night sweats Breast:  Denies any palpable lumps or discharge All other systems were reviewed with the patient and are negative.  PHYSICAL EXAMINATION: ECOG PERFORMANCE STATUS: 2 - Symptomatic, <50% confined to bed  Vitals:   07/25/23 1455  BP: (!) 106/57  Pulse: 94  Resp: 17  Temp: 97.9 F (  36.6 C)  SpO2: 95%   Filed Weights   07/25/23 1455  Weight: 286 lb 14.4 oz (130.1 kg)    GENERAL:alert, no distress and comfortable     LABORATORY DATA:  I have reviewed the data as listed Lab Results  Component Value Date   WBC 6.1 11/23/2022   HGB 11.7 (L) 11/23/2022   HCT 37.3 11/23/2022   MCV 92.1 11/23/2022   PLT 261 11/23/2022   Lab Results  Component Value Date   NA 143 11/23/2022   K 3.9 11/23/2022   CL 101 11/23/2022   CO2 28 11/23/2022    RADIOGRAPHIC STUDIES: I have personally reviewed the radiological reports and agreed with the findings in the report.  ASSESSMENT AND PLAN:  Malignant neoplasm of upper-outer quadrant of right breast in female, estrogen receptor positive (HCC) 07/10/2023: [facility]:Screening mammogram detected right breast mass 7 mm retroareolar, axilla negative, biopsy: Grade 2 IDC with apocrine features, LVI not identified, ER 100%, PR 100%, Ki67 10%, HER2 1+ IHC negative Prior history of stroke  Pathology and radiology counseling:Discussed with the patient, the details of pathology including the type of breast cancer,the clinical staging, the significance of ER, PR and HER-2/neu receptors and the implications for treatment. After reviewing the pathology in detail, we proceeded to discuss the different treatment options between surgery, radiation, chemotherapy, antiestrogen therapies.  Recommendations: 1. Breast conserving surgery followed by 2. +/- Adjuvant radiation therapy followed by 3. Adjuvant antiestrogen  therapy  Oncotype not being considered because of her prior history of stroke and CHF  Return to clinic after surgery to discuss final pathology report and     All questions were answered. The patient knows to call the clinic with any problems, questions or concerns.    Tamsen Meek, MD 07/25/23

## 2023-07-26 ENCOUNTER — Encounter: Payer: Self-pay | Admitting: *Deleted

## 2023-07-30 NOTE — Progress Notes (Signed)
 New Breast Cancer Diagnosis: Right Breast   Did patient present with symptoms (if so, please note symptoms) or screening mammography?:She went for a screening mammogram in November 2024 which identified a possible mass in the right breast.   Histology per Pathology Report: grade 2, Invasive Ductal Carcinoma  Receptor Status: ER(positive), PR (positive), Her2-neu (negative), Ki-(10%)   Surgeon and surgical plan, if any:  Dr. Carolynne Edouard -breast conservation  -Given her findings it is unlikely that she would need a node evaluation.  -We will need cardiac clearance prior to scheduling.  -I will go ahead and refer her to medical and radiation oncology to discuss adjuvant therapy.     Medical oncologist, treatment if any:   Dr. Pamelia Hoit 07/25/2023 Recommendations: 1. Breast conserving surgery followed by 2. +/- Adjuvant radiation therapy followed by 3. Adjuvant antiestrogen therapy 4. Oncotype not being considered because of her prior history of stroke and CHF.     Family History of Breast/Ovarian/Prostate Cancer:   Lymphedema issues, if any:      Pain issues, if any:     SAFETY ISSUES: Prior radiation?  Pacemaker/ICD?  Possible current pregnancy? Postmenopausal Is the patient on methotrexate?   Current Complaints / other details:

## 2023-07-31 ENCOUNTER — Other Ambulatory Visit: Payer: Self-pay | Admitting: Radiation Oncology

## 2023-07-31 ENCOUNTER — Encounter: Payer: Self-pay | Admitting: Radiation Oncology

## 2023-07-31 ENCOUNTER — Ambulatory Visit
Admission: RE | Admit: 2023-07-31 | Discharge: 2023-07-31 | Disposition: A | Discharge status: Home / Self Care | Source: Ambulatory Visit | Attending: Radiation Oncology | Admitting: Radiation Oncology

## 2023-07-31 ENCOUNTER — Inpatient Hospital Stay
Admission: RE | Admit: 2023-07-31 | Discharge: 2023-07-31 | Disposition: A | Discharge status: Home / Self Care | Payer: Self-pay | Source: Ambulatory Visit | Attending: Radiation Oncology | Admitting: Radiation Oncology

## 2023-07-31 VITALS — Ht 67.0 in | Wt 286.0 lb

## 2023-07-31 DIAGNOSIS — C50411 Malignant neoplasm of upper-outer quadrant of right female breast: Secondary | ICD-10-CM | POA: Diagnosis not present

## 2023-07-31 DIAGNOSIS — Z170 Estrogen receptor positive status [ER+]: Secondary | ICD-10-CM

## 2023-08-01 ENCOUNTER — Other Ambulatory Visit: Payer: Self-pay | Admitting: Radiation Oncology

## 2023-08-01 ENCOUNTER — Encounter: Payer: Self-pay | Admitting: *Deleted

## 2023-08-01 ENCOUNTER — Inpatient Hospital Stay
Admission: RE | Admit: 2023-08-01 | Discharge: 2023-08-01 | Disposition: A | Discharge status: Home / Self Care | Payer: Self-pay | Source: Ambulatory Visit | Attending: Radiation Oncology | Admitting: Radiation Oncology

## 2023-08-01 DIAGNOSIS — C50411 Malignant neoplasm of upper-outer quadrant of right female breast: Secondary | ICD-10-CM

## 2023-08-01 DIAGNOSIS — Z170 Estrogen receptor positive status [ER+]: Secondary | ICD-10-CM

## 2023-08-06 ENCOUNTER — Telehealth: Payer: Self-pay | Admitting: Radiation Oncology

## 2023-08-06 NOTE — Telephone Encounter (Signed)
 3/24 Left voicemail for Dr. Bernerd Pho ref coord [facility], if patient has been scheduled after outgoing referral was sent on 3/20, requesting for date/time.

## 2023-08-07 ENCOUNTER — Encounter: Payer: Self-pay | Admitting: *Deleted

## 2023-08-09 ENCOUNTER — Telehealth: Payer: Self-pay | Admitting: Radiation Oncology

## 2023-08-09 NOTE — Telephone Encounter (Signed)
 3/27 @ 9:55 am Follow up call/left voicemail at Dr. Bernerd Pho office, concerning if patient has been schedule yet.  Still waiting on call back to confirm date/time for outgoing referral.

## 2023-08-14 DIAGNOSIS — I5022 Chronic systolic (congestive) heart failure: Secondary | ICD-10-CM | POA: Diagnosis not present

## 2023-08-14 DIAGNOSIS — I4821 Permanent atrial fibrillation: Secondary | ICD-10-CM | POA: Diagnosis not present

## 2023-08-14 DIAGNOSIS — I340 Nonrheumatic mitral (valve) insufficiency: Secondary | ICD-10-CM | POA: Diagnosis not present

## 2023-08-15 ENCOUNTER — Ambulatory Visit
Admission: RE | Admit: 2023-08-15 | Discharge: 2023-08-15 | Discharge status: Home / Self Care | Source: Ambulatory Visit | Attending: Radiation Oncology | Admitting: Radiation Oncology

## 2023-08-15 ENCOUNTER — Ambulatory Visit: Payer: Self-pay | Admitting: General Surgery

## 2023-08-15 VITALS — BP 125/69 | HR 73 | Temp 98.4°F | Ht 67.0 in | Wt 287.7 lb

## 2023-08-15 DIAGNOSIS — C50411 Malignant neoplasm of upper-outer quadrant of right female breast: Secondary | ICD-10-CM | POA: Insufficient documentation

## 2023-08-15 DIAGNOSIS — Z7901 Long term (current) use of anticoagulants: Secondary | ICD-10-CM | POA: Diagnosis not present

## 2023-08-15 DIAGNOSIS — I4891 Unspecified atrial fibrillation: Secondary | ICD-10-CM | POA: Diagnosis not present

## 2023-08-15 DIAGNOSIS — Z79890 Hormone replacement therapy (postmenopausal): Secondary | ICD-10-CM | POA: Insufficient documentation

## 2023-08-15 DIAGNOSIS — E039 Hypothyroidism, unspecified: Secondary | ICD-10-CM | POA: Diagnosis not present

## 2023-08-15 DIAGNOSIS — Z87891 Personal history of nicotine dependence: Secondary | ICD-10-CM | POA: Insufficient documentation

## 2023-08-15 DIAGNOSIS — Z170 Estrogen receptor positive status [ER+]: Secondary | ICD-10-CM | POA: Diagnosis not present

## 2023-08-15 DIAGNOSIS — Z79899 Other long term (current) drug therapy: Secondary | ICD-10-CM | POA: Insufficient documentation

## 2023-08-15 DIAGNOSIS — E785 Hyperlipidemia, unspecified: Secondary | ICD-10-CM | POA: Diagnosis not present

## 2023-08-15 DIAGNOSIS — Z86718 Personal history of other venous thrombosis and embolism: Secondary | ICD-10-CM | POA: Diagnosis not present

## 2023-08-15 DIAGNOSIS — I10 Essential (primary) hypertension: Secondary | ICD-10-CM | POA: Insufficient documentation

## 2023-08-15 DIAGNOSIS — K219 Gastro-esophageal reflux disease without esophagitis: Secondary | ICD-10-CM | POA: Insufficient documentation

## 2023-08-15 MED ORDER — KETOROLAC TROMETHAMINE 15 MG/ML IJ SOLN: 15.0000 mg | INTRAMUSCULAR | Status: AC

## 2023-08-16 NOTE — Progress Notes (Signed)
 Radiation Oncology         [phone] ________________________________  Name: Candice Perez        MRN: [id number]  Date of Service: 08/15/2023 DOB: 08-Nov-1950  IO:NGEX, Shea Evans, MD  Dorothy Puffer, MD   Serena Croissant, MD  REFERRING PHYSICIAN: [name], MD  DIAGNOSIS: The encounter diagnosis was Malignant neoplasm of upper-outer quadrant of right female breast, unspecified estrogen receptor status (HCC).   HISTORY OF PRESENT ILLNESS: Candice Perez is a 73 y.o. female seen at the request of Dr. Carolynne Edouard, earlier this year, she was found to have an abnormality in her right breast on screening mammogram.  Biopsy revealed grade 2 invasive ductal carcinoma, ER/PR positive and HER2 negative.  She has been seen in consultation by Dr. Carolynne Edouard, and surgical options have been discussed; she wishes to proceed with lumpectomy.  She has also been seen in consultation by Dr. Dorothy Puffer, and adjuvant radiation has been discussed.  As she lives in [place], she asked that her radiation be performed at our facility.  Of note, she has yet to have her lumpectomy performed, as she required clearance from cardiology; she reports that this was recently obtained.   PREVIOUS RADIATION THERAPY: No   PAST MEDICAL HISTORY:  Past Medical History:  Diagnosis Date   A-fib (HCC)    Arthritis    Breast cancer (HCC) 07/2023   DVT (deep venous thrombosis) (HCC)    Edema    GERD (gastroesophageal reflux disease)    OCCASIONAL - PRILOSEC IF NEEDED   Hyperlipidemia    Hypertension    no meds now   Hypothyroidism    Varicose veins        PAST SURGICAL HISTORY: Past Surgical History:  Procedure Laterality Date   CHOLECYSTECTOMY     Laparoscopic   CYSTOCELE REPAIR     HERNIA REPAIR     IR CT HEAD LTD  12/02/2020   IR PERCUTANEOUS ART THROMBECTOMY/INFUSION INTRACRANIAL INC DIAG ANGIO  12/02/2020   RADIOLOGY WITH ANESTHESIA N/A 12/02/2020   Procedure: IR WITH ANESTHESIA;  Surgeon: Julieanne Cotton, MD;   Location: [facility] OR;  Service: Radiology;  Laterality: N/A;   RIGHT/LEFT HEART CATH AND CORONARY ANGIOGRAPHY N/A 11/20/2022   Procedure: RIGHT/LEFT HEART CATH AND CORONARY ANGIOGRAPHY;  Surgeon: [name], MD;  Location: [facility];  Service: Cardiovascular;  Laterality: N/A;   TONSILLECTOMY     TOTAL KNEE ARTHROPLASTY Left 08/26/2013   Procedure: LEFT TOTAL KNEE REVISION;  Surgeon: Shelda Pal, MD;  Location: [facility];  Service: Orthopedics;  Laterality: Left;   VARICOSE VEIN SURGERY       FAMILY HISTORY:  Family History  Problem Relation Age of Onset   Congestive Heart Failure Mother    Cancer Mother        tongue/sinus   Cancer Father        "in his back"   Other Father        Varicose veins, Arteriosclerosis   Breast cancer Sister      SOCIAL HISTORY:  reports that she quit smoking about 12 years ago. Her smoking use included cigarettes. She started smoking about 52 years ago. She has never used smokeless tobacco. She reports that she does not drink alcohol and does not use drugs.   ALLERGIES: Patient has no known allergies.   MEDICATIONS:  Current Outpatient Medications  Medication Sig Dispense Refill   acetaminophen (TYLENOL) 650 MG CR tablet Take by mouth.  allopurinol (ZYLOPRIM) 100 MG tablet Take 1 tablet (100 mg total) by mouth daily. 30 tablet 3   apixaban (ELIQUIS) 5 MG TABS tablet Take 1 tablet (5 mg total) by mouth 2 (two) times daily. 60 tablet 0   Ascorbic Acid (VITAMIN C) 500 MG CAPS      carvedilol (COREG) 12.5 MG tablet Take 1 tablet (12.5 mg total) by mouth 2 (two) times daily with a meal. 180 tablet 1   Cholecalciferol (VITAMIN D3) 1000 units CAPS      cyanocobalamin (VITAMIN B12) 1000 MCG tablet Take by mouth.     digoxin (LANOXIN) 0.125 MG tablet Take 0.5 tablets (0.0625 mg total) by mouth daily. 90 tablet 3   diltiazem (CARDIZEM CD) 120 MG 24 hr capsule Take 1 capsule (120 mg total) by mouth daily. 90 capsule 1   gabapentin (NEURONTIN) 400 MG  capsule Take 1 capsule (400 mg total) by mouth at bedtime. 90 capsule 3   KLOR-CON M20 20 MEQ tablet Take 20 mEq by mouth daily.     levothyroxine (SYNTHROID) 125 MCG tablet Take 1 tablet (125 mcg total) by mouth daily at 6 (six) AM. 30 tablet 3   Magnesium Oxide -Mg Supplement 250 MG TABS Take by mouth.     Multiple Vitamins-Minerals (MULTIVITAMIN WOMEN) TABS      rosuvastatin (CRESTOR) 20 MG tablet Take 1 tablet (20 mg total) by mouth daily. 30 tablet 0   TART CHERRY PO Take 500 mg by mouth 3 (three) times daily.     torsemide (DEMADEX) 20 MG tablet Take 20 mg by mouth daily.     Zinc 50 MG TABS      No current facility-administered medications for this encounter.   Facility-Administered Medications Ordered in Other Encounters  Medication Dose Route Frequency Provider Last Rate Last Admin   ketorolac (TORADOL) 15 MG/ML injection 15 mg  15 mg Intravenous On Call to OR Griselda Miner, MD         REVIEW OF SYSTEMS: On review of systems, the patient reports that she is doing well overall.  She denies any chest pain, shortness of breath, cough, fevers, chills, night sweats, unintended weight changes.  She denies any bowel or bladder disturbances, and denies abdominal pain, nausea or vomiting.  She denies any new musculoskeletal or joint aches or pains.  She reports no pain in either breast.  She denies nipple bleeding or discharge.  A complete review of systems is obtained and is otherwise negative.     PHYSICAL EXAM:  Wt Readings from Last 3 Encounters:  08/15/23 287 lb 11.2 oz (130.5 kg)  07/31/23 286 lb (129.7 kg)  07/25/23 286 lb 14.4 oz (130.1 kg)   Temp Readings from Last 3 Encounters:  08/15/23 98.4 F (36.9 C) (Oral)  07/25/23 97.9 F (36.6 C) (Temporal)  11/24/22 98 F (36.7 C) (Oral)   BP Readings from Last 3 Encounters:  08/15/23 125/69  07/25/23 (!) 106/57  11/24/22 95/64   Pulse Readings from Last 3 Encounters:  08/15/23 73  07/25/23 94  11/24/22 71   Pain  Assessment Pain Score: 0-No pain/10  In general this is a well appearing female in no acute distress.  She's alert and oriented x4 and appropriate throughout the examination. Cardiopulmonary assessment is negative for acute distress and she exhibits normal effort.  Both the right and left breasts are without masses, tenderness, or edema.  No axillary lymphadenopathy is appreciated.    ECOG = 0  0 - Asymptomatic (Fully  active, able to carry on all predisease activities without restriction)  1 - Symptomatic but completely ambulatory (Restricted in physically strenuous activity but ambulatory and able to carry out work of a light or sedentary nature. For example, light housework, office work)  2 - Symptomatic, <50% in bed during the day (Ambulatory and capable of all self care but unable to carry out any work activities. Up and about more than 50% of waking hours)  3 - Symptomatic, >50% in bed, but not bedbound (Capable of only limited self-care, confined to bed or chair 50% or more of waking hours)  4 - Bedbound (Completely disabled. Cannot carry on any self-care. Totally confined to bed or chair)  5 - Death   Santiago Glad MM, Creech RH, Tormey DC, et al. [phone]). "Toxicity and response criteria of the [facility] Group". Am. Evlyn Clines. Oncol. 5 (6): 649-55    LABORATORY DATA:  Lab Results  Component Value Date   WBC 6.1 11/23/2022   HGB 11.7 (L) 11/23/2022   HCT 37.3 11/23/2022   MCV 92.1 11/23/2022   PLT 261 11/23/2022   Lab Results  Component Value Date   NA 143 11/23/2022   K 3.9 11/23/2022   CL 101 11/23/2022   CO2 28 11/23/2022   Lab Results  Component Value Date   ALT 89 (H) 11/23/2022   AST 67 (H) 11/23/2022   ALKPHOS 61 11/23/2022   BILITOT 0.5 11/23/2022     IMPRESSION/PLAN: 1.The patient is a 73 year old female with invasive ductal carcinoma of the right breast, hormone receptor positive.  She has been cleared by cardiology, and will now proceed with  right breast lumpectomy and sentinel lymph node sampling.  She has been seen in consultation by Dr. Pamelia Hoit, and adjuvant chemotherapy has not been recommended.  I again reviewed with the patient the rationale behind adjuvant breast radiation in her clinical scenario, as well as its potential side effects.  I explained that these may include, but are not limited to, skin irritation/breakdown, fatigue, and long-term risk of injury to a very small volume of her right lung.  She expressed understanding.  We will make arrangements for her to return to our department approximately 3 weeks following her lumpectomy to proceed with simulation and treatment planning.  I encouraged her to contact me in the interim with any questions or concerns she may have.  In a visit lasting 40 minutes, greater than 50% of the time was spent face to face discussing the patient's condition, in preparation for the discussion, and coordinating the patient's care.    Collyn Selk A. Thersa Salt, MD   **Disclaimer: This note was dictated with voice recognition software. Similar sounding words can inadvertently be transcribed and this note may contain transcription errors which may not have been corrected upon publication of note.**

## 2023-08-17 ENCOUNTER — Other Ambulatory Visit: Payer: Self-pay | Admitting: General Surgery

## 2023-08-17 DIAGNOSIS — Z170 Estrogen receptor positive status [ER+]: Secondary | ICD-10-CM

## 2023-08-17 NOTE — Progress Notes (Signed)
 Phone contact with pt. Pt reports that she is scheduled for a lumpectomy with Dr.Toth on 08/29/23. Encouraged pt to call with questions or concerns.

## 2023-08-27 ENCOUNTER — Encounter: Payer: Self-pay | Admitting: *Deleted

## 2023-08-27 ENCOUNTER — Telehealth: Payer: Self-pay | Admitting: Hematology and Oncology

## 2023-08-27 DIAGNOSIS — C50411 Malignant neoplasm of upper-outer quadrant of right female breast: Secondary | ICD-10-CM

## 2023-08-27 NOTE — Progress Notes (Signed)
 Surgical Instructions   Your procedure is scheduled on Wednesday, April 16th. Report to [facility] Main Entrance "A" at 6:30 A.M., then check in with the Admitting office. Any questions or running late day of surgery: call [phone]  Questions prior to your surgery date: call [phone], Monday-Friday, 8am-4pm. If you experience any cold or flu symptoms such as cough, fever, chills, shortness of breath, etc. between now and your scheduled surgery, please notify us at the above number.     Remember:  Do not eat after midnight the night before your surgery   You may drink clear liquids until 5:30 the morning of your surgery.   Clear liquids allowed are: Water, Non-Citrus Juices (without pulp), Carbonated Beverages, Clear Tea (no milk, honey, etc.), Black Coffee Only (NO MILK, CREAM OR POWDERED CREAMER of any kind), and Gatorade.    Take these medicines the morning of surgery with A SIP OF WATER: Allopurinol (Zyloprim) Carvedilol (Coreg) Digoxin (Lanoxin) Dilt-XR 240 MG Levothyroxine (Synthroid) Rosuvastatin (Crestor)   May take these medicines IF NEEDED: Acetaminophen (Tylenol)   Follow your surgeon's instructions on when to stop your Eliquis.  If no instructions received, please reach out to you surgeon immediately.     One week prior to surgery, STOP taking any Aspirin (unless otherwise instructed by your surgeon) Aleve, Naproxen, Ibuprofen, Motrin, Advil, Goody's, BC's, all herbal medications, fish oil, and non-prescription vitamins.                     Do NOT Smoke (Tobacco/Vaping) for 24 hours prior to your procedure.  If you use a CPAP at night, you may bring your mask/headgear for your overnight stay.   You will be asked to remove any contacts, glasses, piercing's, hearing aid's, dentures/partials prior to surgery. Please bring cases for these items if needed.    Patients discharged the day of surgery will not be allowed to drive home, and someone needs to stay  with them for 24 hours.  SURGICAL WAITING ROOM VISITATION Patients may have no more than 2 support people in the waiting area - these visitors may rotate.   Pre-op nurse will coordinate an appropriate time for 1 ADULT support person, who may not rotate, to accompany patient in pre-op.  Children under the age of 58 must have an adult with them who is not the patient and must remain in the main waiting area with an adult.  If the patient needs to stay at the hospital during part of their recovery, the visitor guidelines for inpatient rooms apply.  Please refer to the [facility] website for the visitor guidelines for any additional information.   If you received a COVID test during your pre-op visit  it is requested that you wear a mask when out in public, stay away from anyone that may not be feeling well and notify your surgeon if you develop symptoms. If you have been in contact with anyone that has tested positive in the last 10 days please notify you surgeon.      Pre-operative CHG Bathing Instructions   You can play a key role in reducing the risk of infection after surgery. Your skin needs to be as free of germs as possible. You can reduce the number of germs on your skin by washing with CHG (chlorhexidine gluconate) soap before surgery. CHG is an antiseptic soap that kills germs and continues to kill germs even after washing.   DO NOT use if you have an allergy to chlorhexidine/CHG  or antibacterial soaps. If your skin becomes reddened or irritated, stop using the CHG and notify one of our RNs at [phone].              TAKE A SHOWER THE NIGHT BEFORE SURGERY AND THE DAY OF SURGERY    Please keep in mind the following:  DO NOT shave, including legs and underarms, 48 hours prior to surgery.   You may shave your face before/day of surgery.  Place clean sheets on your bed the night before surgery Use a clean washcloth (not used since being washed) for each shower. DO NOT sleep with  pet's night before surgery.  CHG Shower Instructions:  Wash your face and private area with normal soap. If you choose to wash your hair, wash first with your normal shampoo.  After you use shampoo/soap, rinse your hair and body thoroughly to remove shampoo/soap residue.  Turn the water OFF and apply half the bottle of CHG soap to a CLEAN washcloth.  Apply CHG soap ONLY FROM YOUR NECK DOWN TO YOUR TOES (washing for 3-5 minutes)  DO NOT use CHG soap on face, private areas, open wounds, or sores.  Pay special attention to the area where your surgery is being performed.  If you are having back surgery, having someone wash your back for you may be helpful. Wait 2 minutes after CHG soap is applied, then you may rinse off the CHG soap.  Pat dry with a clean towel  Put on clean pajamas    Additional instructions for the day of surgery: DO NOT APPLY any lotions, deodorants, cologne, or perfumes.   Do not wear jewelry or makeup Do not wear nail polish, gel polish, artificial nails, or any other type of covering on natural nails (fingers and toes) Do not bring valuables to the hospital. [facility] is not responsible for valuables/personal belongings. Put on clean/comfortable clothes.  Please brush your teeth.  Ask your nurse before applying any prescription medications to the skin.

## 2023-08-27 NOTE — Telephone Encounter (Signed)
 Confirmed with pt scheduled appt date and time

## 2023-08-28 ENCOUNTER — Ambulatory Visit
Admission: RE | Admit: 2023-08-28 | Discharge: 2023-08-28 | Disposition: A | Discharge status: Home / Self Care | Source: Ambulatory Visit | Attending: General Surgery | Admitting: General Surgery

## 2023-08-28 ENCOUNTER — Other Ambulatory Visit: Payer: Self-pay

## 2023-08-28 ENCOUNTER — Encounter (HOSPITAL_COMMUNITY)
Admission: RE | Admit: 2023-08-28 | Discharge: 2023-08-28 | Disposition: A | Discharge status: Home / Self Care | Source: Ambulatory Visit | Attending: General Surgery | Admitting: General Surgery

## 2023-08-28 ENCOUNTER — Encounter (HOSPITAL_COMMUNITY): Payer: Self-pay | Admitting: General Surgery

## 2023-08-28 DIAGNOSIS — I509 Heart failure, unspecified: Secondary | ICD-10-CM | POA: Diagnosis not present

## 2023-08-28 DIAGNOSIS — Z96652 Presence of left artificial knee joint: Secondary | ICD-10-CM | POA: Diagnosis not present

## 2023-08-28 DIAGNOSIS — E785 Hyperlipidemia, unspecified: Secondary | ICD-10-CM | POA: Insufficient documentation

## 2023-08-28 DIAGNOSIS — I69351 Hemiplegia and hemiparesis following cerebral infarction affecting right dominant side: Secondary | ICD-10-CM | POA: Diagnosis not present

## 2023-08-28 DIAGNOSIS — E039 Hypothyroidism, unspecified: Secondary | ICD-10-CM | POA: Insufficient documentation

## 2023-08-28 DIAGNOSIS — Z1721 Progesterone receptor positive status: Secondary | ICD-10-CM | POA: Diagnosis not present

## 2023-08-28 DIAGNOSIS — Z86718 Personal history of other venous thrombosis and embolism: Secondary | ICD-10-CM | POA: Insufficient documentation

## 2023-08-28 DIAGNOSIS — Z1732 Human epidermal growth factor receptor 2 negative status: Secondary | ICD-10-CM | POA: Diagnosis not present

## 2023-08-28 DIAGNOSIS — I700 Atherosclerosis of aorta: Secondary | ICD-10-CM | POA: Diagnosis not present

## 2023-08-28 DIAGNOSIS — I110 Hypertensive heart disease with heart failure: Secondary | ICD-10-CM | POA: Diagnosis not present

## 2023-08-28 DIAGNOSIS — C50411 Malignant neoplasm of upper-outer quadrant of right female breast: Secondary | ICD-10-CM | POA: Diagnosis not present

## 2023-08-28 DIAGNOSIS — Z170 Estrogen receptor positive status [ER+]: Secondary | ICD-10-CM | POA: Insufficient documentation

## 2023-08-28 DIAGNOSIS — E66813 Obesity, class 3: Secondary | ICD-10-CM | POA: Diagnosis not present

## 2023-08-28 DIAGNOSIS — C50911 Malignant neoplasm of unspecified site of right female breast: Secondary | ICD-10-CM | POA: Diagnosis not present

## 2023-08-28 DIAGNOSIS — Z01812 Encounter for preprocedural laboratory examination: Secondary | ICD-10-CM | POA: Insufficient documentation

## 2023-08-28 DIAGNOSIS — I4891 Unspecified atrial fibrillation: Secondary | ICD-10-CM | POA: Insufficient documentation

## 2023-08-28 DIAGNOSIS — K219 Gastro-esophageal reflux disease without esophagitis: Secondary | ICD-10-CM | POA: Diagnosis not present

## 2023-08-28 DIAGNOSIS — Z8673 Personal history of transient ischemic attack (TIA), and cerebral infarction without residual deficits: Secondary | ICD-10-CM | POA: Insufficient documentation

## 2023-08-28 DIAGNOSIS — I5022 Chronic systolic (congestive) heart failure: Secondary | ICD-10-CM | POA: Diagnosis not present

## 2023-08-28 DIAGNOSIS — Z6841 Body Mass Index (BMI) 40.0 and over, adult: Secondary | ICD-10-CM | POA: Diagnosis not present

## 2023-08-28 DIAGNOSIS — Z87891 Personal history of nicotine dependence: Secondary | ICD-10-CM | POA: Diagnosis not present

## 2023-08-28 DIAGNOSIS — C50111 Malignant neoplasm of central portion of right female breast: Secondary | ICD-10-CM | POA: Diagnosis not present

## 2023-08-28 DIAGNOSIS — Z7901 Long term (current) use of anticoagulants: Secondary | ICD-10-CM | POA: Insufficient documentation

## 2023-08-28 DIAGNOSIS — I2510 Atherosclerotic heart disease of native coronary artery without angina pectoris: Secondary | ICD-10-CM | POA: Diagnosis not present

## 2023-08-28 DIAGNOSIS — Z01818 Encounter for other preprocedural examination: Secondary | ICD-10-CM | POA: Diagnosis not present

## 2023-08-28 HISTORY — PX: BREAST BIOPSY: SHX20

## 2023-08-28 LAB — CBC
HCT: 39.2 % (ref 36.0–46.0)
Hemoglobin: 12.5 g/dL (ref 12.0–15.0)
MCH: 29.6 pg (ref 26.0–34.0)
MCHC: 31.9 g/dL (ref 30.0–36.0)
MCV: 92.7 fL (ref 80.0–100.0)
Platelets: 286 10*3/uL (ref 150–400)
RBC: 4.23 MIL/uL (ref 3.87–5.11)
RDW: 13.8 % (ref 11.5–15.5)
WBC: 10.2 10*3/uL (ref 4.0–10.5)
nRBC: 0 % (ref 0.0–0.2)

## 2023-08-28 LAB — BASIC METABOLIC PANEL WITH GFR
Anion gap: 12 (ref 5–15)
BUN: 24 mg/dL — ABNORMAL HIGH (ref 8–23)
CO2: 28 mmol/L (ref 22–32)
Calcium: 9.8 mg/dL (ref 8.9–10.3)
Chloride: 101 mmol/L (ref 98–111)
Creatinine, Ser: 1.39 mg/dL — ABNORMAL HIGH (ref 0.44–1.00)
GFR, Estimated: 40 mL/min — ABNORMAL LOW (ref >60)
Glucose, Bld: 104 mg/dL — ABNORMAL HIGH (ref 70–99)
Potassium: 4.2 mmol/L (ref 3.5–5.1)
Sodium: 141 mmol/L (ref 135–145)

## 2023-08-28 LAB — SURGICAL PCR SCREEN
MRSA, PCR: POSITIVE — AB
Staphylococcus aureus: POSITIVE — AB

## 2023-08-28 NOTE — Progress Notes (Signed)
 MRSA PCR positive, called by lab at 1730 today. Message sent to Dr Alethea Andes.

## 2023-08-28 NOTE — Progress Notes (Signed)
 Anesthesia Chart Review:  Case: [id number] Date/Time: 08/29/23 0815   Procedure: BREAST LUMPECTOMY WITH RADIOACTIVE SEED LOCALIZATION (Right: Breast) - RIGHT BREAST RADIOACTIVE SEED LOCALIZED LUMPECTOMY   Anesthesia type: General   Diagnosis: Malignant neoplasm of upper-outer quadrant of right breast in female, estrogen receptor positive (HCC) [C50.411, Z17.0]   Pre-op diagnosis: RIGHT BREAST CANCER   [facility] ROOM 01 / [facility]   Surgeons: Griselda Miner, MD       DISCUSSION: Patient is a 73 year old female scheduled for the above procedure.  History includes former smoker (quit 09/17/10), HTN, HLD, CAD (mid LAD, LCX 11/20/22), chronic systolic CHF, afib, hypothyroidism, DVT (RLE chronic DVT 05/05/21 [place]), CVA (left MCA occlusion, s/p thrombolysis 12/02/20) GERD, dyspnea, breast cancer (right breast IDC 07/10/23), varicose vein (s/p ablation left GSV 05/22/11), osteoarthritis (left TKA, revision 08/2013).   She had a preoperative cardiology evaluation by Dr. Jodelle Green on 08/14/23 (scanned under Media tab). She was for follow-up afib, palpitations, edema, LV dysfunction. Afib was rate controlled. Creatinine 1/2 with 05/21/23 labs. She was feeling better after reduction of carvedilol, but was doing to see if she would tolerated increased to 12.5 mg Q AM and 6.25 mg Q PM.. Also on digoxin, diltiazem, rosuvastatin, furosemide, and Eliquis. He wrote, "May undergo right breast lumpectomy as arranged." He gave permission to hold Eliquis 4-5 days and may "consider" Lovenox bridge. She reported that compromise was to hold Eliquis on 08/26/23 with no need for bridging.  He plans to update her echo in the future.   She denied any new CV symptoms since her recent theology evaluation.  She denied chest pain and shortness of breath at rest.  She has stable DOE with activity such as long walks.  RSL is scheduled for 08/28/23 right after her PAT visit. Anesthesia team to evaluate on the day of surgery.   VS: BP 107/74    Pulse 85   Temp 36.6 C   Resp 18   Ht 5\' 7"  (1.702 m)   Wt 131.1 kg   SpO2 95%   BMI 45.26 kg/m    PROVIDERS: Marylen Ponto, MD is PCP [name], MD is cardiologist  Serena Croissant, MD is HEM-ONC Lance Bosch, MD is RAD-ONC   LABS: CBC and BMP results from 09/07/23 showed WBC 10.2, H/H 12.5/39.2, PLT 286., glucose 104, Na 141, K 4.2, BUN 24, Cr 1.39 (Cr A 1.20's range).   IMAGES: CTA Neck 12/02/20 (Canopy/PACS)  IMPRESSION: 1. Bilateral carotid bifurcation atherosclerosis with approximately 40% stenosis of the proximal left ICA. 2. Significantly limited evaluation of the great vessel origins in the neck due to artifact with possible stenosis of the right common carotid artery origin and bilateral vertebral artery origin. If clinically significant, a repeat study may better characterize.  EKG:   CV: RHC/LHC 11/20/22: Left Main: Vessel is normal in caliber.  Vessel is angiographically normal.  Almost double barrel. Left Anterior Descending: Vessel is normal in caliber.  There is mild focal disease in the vessel.    Left Circumflex: Vessel is large.  There is mild focal disease in the vessel. Right Coronary Artery: Vessel is large.  Vessel is angiographically normal.  Conclusion:   There is moderate to severe left ventricular systolic dysfunction.   The left ventricular ejection fraction is 25-35% by visual estimate.   Hemodynamic findings consistent with mild pulmonary hypertension.   Recommend to resume Apixaban, at currently prescribed dose and frequency.   Resume Apixaban for atrial fibrillation. Life-style modification for CHF.  Echo 11/17/22: IMPRESSIONS   1. Left ventricular ejection fraction, by estimation, is 30 to 35%. The  left ventricle has moderately decreased function. The left ventricle  demonstrates global hypokinesis. The left ventricular internal cavity size  was mildly dilated. Left ventricular  diastolic parameters are indeterminate.   2.  Right ventricular systolic function is mildly reduced. The right  ventricular size is normal.   3. Left atrial size was severely dilated.   4. Right atrial size was moderately dilated.   5. The mitral valve is degenerative. Moderate mitral valve regurgitation.  No evidence of mitral stenosis.   6. The aortic valve is tricuspid. There is mild calcification of the  aortic valve. There is mild thickening of the aortic valve. Aortic valve  regurgitation is not visualized.   7. There is mild (Grade II) atheroma plaque involving the ascending aorta  and aortic root.   8. The inferior vena cava is dilated in size with <50% respiratory  variability, suggesting right atrial pressure of 15 mmHg.  - Comparison: 08/06/21 [facility] LVEF 50-55%, no regional wall motion abnormalities, trace MR/TR; 12/06/20: LVEF 40-45%, diffuse LV hypokinesis, grade 1 DD, mild MR, mild-moderate TR; LVEF 55-60% 06/20/17  Past Medical History:  Diagnosis Date   A-fib (HCC)    Arthritis    Breast cancer (HCC) 07/2023   DVT (deep venous thrombosis) (HCC)    Edema    GERD (gastroesophageal reflux disease)    OCCASIONAL - PRILOSEC IF NEEDED   Hyperlipidemia    Hypertension    no meds now   Hypothyroidism    Varicose veins     Past Surgical History:  Procedure Laterality Date   CHOLECYSTECTOMY     Laparoscopic   CYSTOCELE REPAIR     HERNIA REPAIR     IR CT HEAD LTD  12/02/2020   IR PERCUTANEOUS ART THROMBECTOMY/INFUSION INTRACRANIAL INC DIAG ANGIO  12/02/2020   RADIOLOGY WITH ANESTHESIA N/A 12/02/2020   Procedure: IR WITH ANESTHESIA;  Surgeon: Luellen Sages, MD;  Location: [facility] OR;  Service: Radiology;  Laterality: N/A;   RIGHT/LEFT HEART CATH AND CORONARY ANGIOGRAPHY N/A 11/20/2022   Procedure: RIGHT/LEFT HEART CATH AND CORONARY ANGIOGRAPHY;  Surgeon: Pasqual Bone, MD;  [facility]: [facility] INV[facility] LAB;  Service: Cardiovascular;  Laterality: N/A;   TONSILLECTOMY     TOTAL KNEE ARTHROPLASTY Left 08/26/2013    Procedure: LEFT TOTAL KNEE REVISION;  Surgeon: Bevin Bucks, MD;  Location: [facility] ORS;  Service: Orthopedics;  Laterality: Left;   VARICOSE VEIN SURGERY      MEDICATIONS:  acetaminophen (TYLENOL) 650 MG CR tablet   allopurinol (ZYLOPRIM) 100 MG tablet   apixaban (ELIQUIS) 5 MG TABS tablet   Ascorbic Acid (VITAMIN C) 500 MG CAPS   carvedilol (COREG) 12.5 MG tablet   carvedilol (COREG) 6.25 MG tablet   Cholecalciferol (VITAMIN D3) 1000 units CAPS   cyanocobalamin (VITAMIN B12) 1000 MCG tablet   digoxin (LANOXIN) 0.125 MG tablet   DILT-XR 240 MG 24 hr capsule   diltiazem (CARDIZEM CD) 120 MG 24 hr capsule   gabapentin (NEURONTIN) 400 MG capsule   KLOR-CON M20 20 MEQ tablet   levothyroxine (SYNTHROID) 125 MCG tablet   Magnesium Oxide -Mg Supplement 250 MG TABS   Multiple Vitamins-Minerals (MULTIVITAMIN WOMEN) TABS   rosuvastatin (CRESTOR) 20 MG tablet   TART CHERRY PO   torsemide (DEMADEX) 20 MG tablet   Zinc 50 MG TABS   No current facility-administered medications for this encounter.    Jereline Ticer, PA-C  Surgical Short Stay/Anesthesiology [facility] Phone [phone] [facility] Phone [phone] 08/28/2023 12:44 PM

## 2023-08-28 NOTE — Anesthesia Preprocedure Evaluation (Addendum)
 Anesthesia Evaluation  Patient identified by MRN, date of birth, ID band Patient awake    Reviewed: Allergy & Precautions, Patient's Chart, lab work & pertinent test results, reviewed documented beta blocker date and time   Airway Mallampati: II  TM Distance: >3 FB Neck ROM: Full    Dental no notable dental hx.    Pulmonary former smoker   Pulmonary exam normal        Cardiovascular hypertension, Pt. on medications and Pt. on home beta blockers + CAD, + Cardiac Stents (mid LAD, LCX 11/20/22), +CHF (LVEF 30-35%) and + DVT (2022 RLE)  + dysrhythmias (eliquis) Atrial Fibrillation + Valvular Problems/Murmurs (mod MR) MR  Rhythm:Regular Rate:Normal  CV: RHC/LHC 11/20/22: Left Main: Vessel is normal in caliber.  Vessel is angiographically normal.  Almost double barrel. Left Anterior Descending: Vessel is normal in caliber.  There is mild focal disease in the vessel.    Left Circumflex: Vessel is large.  There is mild focal disease in the vessel. Right Coronary Artery: Vessel is large.  Vessel is angiographically normal.   Conclusion:   There is moderate to severe left ventricular systolic dysfunction.   The left ventricular ejection fraction is 25-35% by visual estimate.   Hemodynamic findings consistent with mild pulmonary hypertension.   Recommend to resume Apixaban, at currently prescribed dose and frequency.   Resume Apixaban for atrial fibrillation. Life-style modification for CHF.     Echo 11/17/22: IMPRESSIONS   1. Left ventricular ejection fraction, by estimation, is 30 to 35%. The  left ventricle has moderately decreased function. The left ventricle  demonstrates global hypokinesis. The left ventricular internal cavity size  was mildly dilated. Left ventricular  diastolic parameters are indeterminate.   2. Right ventricular systolic function is mildly reduced. The right  ventricular size is normal.   3. Left atrial size  was severely dilated.   4. Right atrial size was moderately dilated.   5. The mitral valve is degenerative. Moderate mitral valve regurgitation.  No evidence of mitral stenosis.   6. The aortic valve is tricuspid. There is mild calcification of the  aortic valve. There is mild thickening of the aortic valve. Aortic valve  regurgitation is not visualized.   7. There is mild (Grade II) atheroma plaque involving the ascending aorta  and aortic root.   8. The inferior vena cava is dilated in size with <50% respiratory  variability, suggesting right atrial pressure of 15 mmHg.  - Comparison: 08/06/21 [facility] LVEF 50-55%, no regional wall motion abnormalities, trace MR/TR; 12/06/20: LVEF 40-45%, diffuse LV hypokinesis, grade 1 DD, mild MR, mild-moderate TR; LVEF 55-60% 06/20/17      Neuro/Psych CVA (2022, residual R hand weakness), Residual Symptoms  negative psych ROS   GI/Hepatic Neg liver ROS,GERD  Medicated and Controlled,,  Endo/Other  Hypothyroidism  Class 3 obesityBMI 45  Renal/GU Renal InsufficiencyRenal diseaseCr 1.39  negative genitourinary   Musculoskeletal  (+) Arthritis , Osteoarthritis,    Abdominal Normal abdominal exam  (+)   Peds  Hematology negative hematology ROS (+) Hb 12.5   Anesthesia Other Findings   Reproductive/Obstetrics negative OB ROS                             Anesthesia Physical Anesthesia Plan  ASA: 4  Anesthesia Plan: General   Post-op Pain Management: Tylenol PO (pre-op)*   Induction: Intravenous  PONV Risk Score and Plan: 3 and Ondansetron, Dexamethasone, Midazolam and Treatment  may vary due to age or medical condition  Airway Management Planned: LMA and Mask  Additional Equipment: ClearSight  Intra-op Plan:   Post-operative Plan: Extubation in OR  Informed Consent: I have reviewed the patients History and Physical, chart, labs and discussed the procedure including the risks, benefits and alternatives  for the proposed anesthesia with the patient or authorized representative who has indicated his/her understanding and acceptance.     Dental advisory given  Plan Discussed with: CRNA  Anesthesia Plan Comments: (Clearsight for closer hemodynamic monitoring )       Anesthesia Quick Evaluation

## 2023-08-28 NOTE — Progress Notes (Signed)
 PCP - Suzette Espy, MD Cardiologist - Pasqual Bone, MD   PPM/ICD - denies Device Orders - n/a Rep Notified - n/a  Chest x-ray - 0/07/2022 EKG - 08/14/2023 (in the chart) Stress Test - denies ECHO - 11/17/2022 Cardiac Cath - 11/20/2022  Sleep Study - denies CPAP - n/a  Fasting Blood Sugar - pt denies hx of DM Checks Blood Sugar _____ times a day  Last dose of GLP1 agonist-  n/a GLP1 instructions: n/a  Blood Thinner Instructions: hold Eliquis for 2 days prior to the procedure. Last dose- 08/16/2023 Aspirin Instructions: n/a  ERAS Protcol - yes, til  0530 PRE-SURGERY Ensure or G2- no  COVID TEST- n/a   Anesthesia review: yes (seed placement today); cardiac history.   Patient denies shortness of breath, fever, cough and chest pain at PAT appointment   All instructions explained to the patient, with a verbal understanding of the material. Patient agrees to go over the instructions while at home for a better understanding. Patient also instructed to self quarantine after being tested for COVID-19. The opportunity to ask questions was provided.

## 2023-08-29 ENCOUNTER — Encounter (HOSPITAL_COMMUNITY)
Admission: RE | Disposition: A | Discharge status: Home / Self Care | Payer: Self-pay | Source: Home / Self Care | Attending: General Surgery

## 2023-08-29 ENCOUNTER — Encounter (HOSPITAL_COMMUNITY): Payer: Self-pay | Admitting: General Surgery

## 2023-08-29 ENCOUNTER — Ambulatory Visit (HOSPITAL_COMMUNITY)
Admission: RE | Admit: 2023-08-29 | Discharge: 2023-08-29 | Disposition: A | Discharge status: Home / Self Care | Attending: General Surgery | Admitting: General Surgery

## 2023-08-29 ENCOUNTER — Ambulatory Visit (HOSPITAL_BASED_OUTPATIENT_CLINIC_OR_DEPARTMENT_OTHER): Admitting: Anesthesiology

## 2023-08-29 ENCOUNTER — Ambulatory Visit
Admission: RE | Admit: 2023-08-29 | Discharge: 2023-08-29 | Disposition: A | Discharge status: Home / Self Care | Source: Ambulatory Visit | Attending: General Surgery | Admitting: General Surgery

## 2023-08-29 ENCOUNTER — Other Ambulatory Visit: Payer: Self-pay

## 2023-08-29 ENCOUNTER — Ambulatory Visit (HOSPITAL_COMMUNITY): Admitting: Vascular Surgery

## 2023-08-29 DIAGNOSIS — I110 Hypertensive heart disease with heart failure: Secondary | ICD-10-CM | POA: Insufficient documentation

## 2023-08-29 DIAGNOSIS — C50111 Malignant neoplasm of central portion of right female breast: Secondary | ICD-10-CM | POA: Diagnosis not present

## 2023-08-29 DIAGNOSIS — I10 Essential (primary) hypertension: Secondary | ICD-10-CM

## 2023-08-29 DIAGNOSIS — C50911 Malignant neoplasm of unspecified site of right female breast: Secondary | ICD-10-CM | POA: Diagnosis not present

## 2023-08-29 DIAGNOSIS — I4891 Unspecified atrial fibrillation: Secondary | ICD-10-CM | POA: Insufficient documentation

## 2023-08-29 DIAGNOSIS — I700 Atherosclerosis of aorta: Secondary | ICD-10-CM | POA: Insufficient documentation

## 2023-08-29 DIAGNOSIS — Z1721 Progesterone receptor positive status: Secondary | ICD-10-CM | POA: Insufficient documentation

## 2023-08-29 DIAGNOSIS — I480 Paroxysmal atrial fibrillation: Secondary | ICD-10-CM | POA: Diagnosis not present

## 2023-08-29 DIAGNOSIS — I2510 Atherosclerotic heart disease of native coronary artery without angina pectoris: Secondary | ICD-10-CM | POA: Insufficient documentation

## 2023-08-29 DIAGNOSIS — C50411 Malignant neoplasm of upper-outer quadrant of right female breast: Secondary | ICD-10-CM | POA: Diagnosis not present

## 2023-08-29 DIAGNOSIS — Z170 Estrogen receptor positive status [ER+]: Secondary | ICD-10-CM | POA: Diagnosis not present

## 2023-08-29 DIAGNOSIS — Z86718 Personal history of other venous thrombosis and embolism: Secondary | ICD-10-CM | POA: Insufficient documentation

## 2023-08-29 DIAGNOSIS — Z7901 Long term (current) use of anticoagulants: Secondary | ICD-10-CM | POA: Insufficient documentation

## 2023-08-29 DIAGNOSIS — Z1732 Human epidermal growth factor receptor 2 negative status: Secondary | ICD-10-CM | POA: Insufficient documentation

## 2023-08-29 DIAGNOSIS — E66813 Obesity, class 3: Secondary | ICD-10-CM | POA: Insufficient documentation

## 2023-08-29 DIAGNOSIS — I509 Heart failure, unspecified: Secondary | ICD-10-CM | POA: Insufficient documentation

## 2023-08-29 DIAGNOSIS — Z6841 Body Mass Index (BMI) 40.0 and over, adult: Secondary | ICD-10-CM | POA: Insufficient documentation

## 2023-08-29 DIAGNOSIS — Z01818 Encounter for other preprocedural examination: Secondary | ICD-10-CM

## 2023-08-29 DIAGNOSIS — I5022 Chronic systolic (congestive) heart failure: Secondary | ICD-10-CM | POA: Diagnosis not present

## 2023-08-29 DIAGNOSIS — K219 Gastro-esophageal reflux disease without esophagitis: Secondary | ICD-10-CM | POA: Insufficient documentation

## 2023-08-29 DIAGNOSIS — Z87891 Personal history of nicotine dependence: Secondary | ICD-10-CM

## 2023-08-29 DIAGNOSIS — I69351 Hemiplegia and hemiparesis following cerebral infarction affecting right dominant side: Secondary | ICD-10-CM | POA: Insufficient documentation

## 2023-08-29 HISTORY — DX: Cardiac arrhythmia, unspecified: I49.9

## 2023-08-29 HISTORY — PX: BREAST LUMPECTOMY WITH RADIOACTIVE SEED LOCALIZATION: SHX6424

## 2023-08-29 HISTORY — DX: Dyspnea, unspecified: R06.00

## 2023-08-29 SURGERY — BREAST LUMPECTOMY WITH RADIOACTIVE SEED LOCALIZATION
Anesthesia: General | Site: Breast | Laterality: Right

## 2023-08-29 MED ORDER — ONDANSETRON HCL 4 MG/2ML IJ SOLN
INTRAMUSCULAR | Status: AC
  Filled 2023-08-29: qty 2

## 2023-08-29 MED ORDER — OXYCODONE HCL 5 MG/5ML PO SOLN: 5.0000 mg | Freq: Once | ORAL | Status: DC | PRN

## 2023-08-29 MED ORDER — AMISULPRIDE (ANTIEMETIC) 5 MG/2ML IV SOLN: 10.0000 mg | Freq: Once | INTRAVENOUS | Status: DC | PRN

## 2023-08-29 MED ORDER — PHENYLEPHRINE 80 MCG/ML (10ML) SYRINGE FOR IV PUSH (FOR BLOOD PRESSURE SUPPORT)
PREFILLED_SYRINGE | INTRAVENOUS | Status: DC | PRN
  Administered 2023-08-29: 160 ug via INTRAVENOUS
  Administered 2023-08-29: 80 ug via INTRAVENOUS

## 2023-08-29 MED ORDER — PROPOFOL 10 MG/ML IV BOLUS
INTRAVENOUS | Status: AC
  Filled 2023-08-29: qty 20

## 2023-08-29 MED ORDER — ACETAMINOPHEN 500 MG PO TABS: 1000.0000 mg | ORAL_TABLET | Freq: Once | ORAL | Status: DC

## 2023-08-29 MED ORDER — DEXAMETHASONE SODIUM PHOSPHATE 10 MG/ML IJ SOLN
INTRAMUSCULAR | Status: DC | PRN
  Administered 2023-08-29: 10 mg via INTRAVENOUS

## 2023-08-29 MED ORDER — OXYCODONE HCL 5 MG PO TABS: 5.0000 mg | ORAL_TABLET | Freq: Once | ORAL | Status: DC | PRN

## 2023-08-29 MED ORDER — ONDANSETRON HCL 4 MG/2ML IJ SOLN: 4.0000 mg | Freq: Once | INTRAMUSCULAR | Status: DC | PRN

## 2023-08-29 MED ORDER — DEXAMETHASONE SODIUM PHOSPHATE 10 MG/ML IJ SOLN
INTRAMUSCULAR | Status: AC
  Filled 2023-08-29: qty 1

## 2023-08-29 MED ORDER — PHENYLEPHRINE HCL-NACL 20-0.9 MG/250ML-% IV SOLN
INTRAVENOUS | Status: DC | PRN
  Administered 2023-08-29: 40 ug/min via INTRAVENOUS

## 2023-08-29 MED ORDER — FENTANYL CITRATE (PF) 250 MCG/5ML IJ SOLN
INTRAMUSCULAR | Status: DC | PRN
  Administered 2023-08-29: 50 ug via INTRAVENOUS

## 2023-08-29 MED ORDER — STERILE WATER FOR IRRIGATION IR SOLN
Status: DC | PRN
  Administered 2023-08-29: 1000 mL

## 2023-08-29 MED ORDER — PHENYLEPHRINE 80 MCG/ML (10ML) SYRINGE FOR IV PUSH (FOR BLOOD PRESSURE SUPPORT)
PREFILLED_SYRINGE | INTRAVENOUS | Status: AC
  Filled 2023-08-29: qty 10

## 2023-08-29 MED ORDER — VASOPRESSIN 20 UNIT/ML IV SOLN
INTRAVENOUS | Status: AC
  Filled 2023-08-29: qty 1

## 2023-08-29 MED ORDER — BUPIVACAINE-EPINEPHRINE (PF) 0.25% -1:200000 IJ SOLN
INTRAMUSCULAR | Status: AC
  Filled 2023-08-29: qty 30

## 2023-08-29 MED ORDER — ONDANSETRON HCL 4 MG/2ML IJ SOLN
INTRAMUSCULAR | Status: DC | PRN
  Administered 2023-08-29: 4 mg via INTRAVENOUS

## 2023-08-29 MED ORDER — GABAPENTIN 100 MG PO CAPS
100.0000 mg | ORAL_CAPSULE | ORAL | Status: AC
  Administered 2023-08-29: 100 mg via ORAL
  Filled 2023-08-29: qty 1

## 2023-08-29 MED ORDER — CHLORHEXIDINE GLUCONATE CLOTH 2 % EX PADS
6.0000 | MEDICATED_PAD | Freq: Once | CUTANEOUS | Status: DC
Start: 2023-08-29 — End: 2023-08-29

## 2023-08-29 MED ORDER — OXYCODONE HCL 5 MG PO TABS: 5.0000 mg | ORAL_TABLET | Freq: Four times a day (QID) | ORAL | 0 refills | Status: DC | PRN

## 2023-08-29 MED ORDER — LIDOCAINE 2% (20 MG/ML) 5 ML SYRINGE
INTRAMUSCULAR | Status: AC
  Filled 2023-08-29: qty 5

## 2023-08-29 MED ORDER — CEFAZOLIN SODIUM-DEXTROSE 3-4 GM/150ML-% IV SOLN
3.0000 g | INTRAVENOUS | Status: AC
  Administered 2023-08-29: 3 g via INTRAVENOUS
  Filled 2023-08-29: qty 150

## 2023-08-29 MED ORDER — PROPOFOL 10 MG/ML IV BOLUS
INTRAVENOUS | Status: DC | PRN
  Administered 2023-08-29: 130 mg via INTRAVENOUS

## 2023-08-29 MED ORDER — BUPIVACAINE-EPINEPHRINE 0.25% -1:200000 IJ SOLN
INTRAMUSCULAR | Status: DC | PRN
  Administered 2023-08-29: 20 mL

## 2023-08-29 MED ORDER — HYDROMORPHONE HCL 1 MG/ML IJ SOLN: 0.2500 mg | INTRAMUSCULAR | Status: DC | PRN

## 2023-08-29 MED ORDER — ACETAMINOPHEN 500 MG PO TABS
1000.0000 mg | ORAL_TABLET | ORAL | Status: AC
  Administered 2023-08-29: 1000 mg via ORAL
  Filled 2023-08-29: qty 2

## 2023-08-29 MED ORDER — ORAL CARE MOUTH RINSE: 15.0000 mL | Freq: Once | OROMUCOSAL | Status: AC

## 2023-08-29 MED ORDER — LACTATED RINGERS IV SOLN: INTRAVENOUS | Status: DC

## 2023-08-29 MED ORDER — FENTANYL CITRATE (PF) 250 MCG/5ML IJ SOLN
INTRAMUSCULAR | Status: AC
  Filled 2023-08-29: qty 5

## 2023-08-29 MED ORDER — CHLORHEXIDINE GLUCONATE CLOTH 2 % EX PADS: 6.0000 | MEDICATED_PAD | Freq: Once | CUTANEOUS | Status: DC

## 2023-08-29 MED ORDER — LIDOCAINE 2% (20 MG/ML) 5 ML SYRINGE
INTRAMUSCULAR | Status: DC | PRN
  Administered 2023-08-29: 40 mg via INTRAVENOUS

## 2023-08-29 MED ORDER — 0.9 % SODIUM CHLORIDE (POUR BTL) OPTIME
TOPICAL | Status: DC | PRN
  Administered 2023-08-29: 1000 mL

## 2023-08-29 MED ORDER — CHLORHEXIDINE GLUCONATE 0.12 % MT SOLN
15.0000 mL | Freq: Once | OROMUCOSAL | Status: AC
  Administered 2023-08-29: 15 mL via OROMUCOSAL
  Filled 2023-08-29: qty 15

## 2023-08-29 MED ORDER — EPHEDRINE 5 MG/ML INJ
INTRAVENOUS | Status: AC
  Filled 2023-08-29: qty 5

## 2023-08-29 SURGICAL SUPPLY — 30 items
APPLIER CLIP 9.375 MED OPEN (MISCELLANEOUS) IMPLANT
BAG COUNTER SPONGE SURGICOUNT (BAG) IMPLANT
BINDER BREAST LRG (GAUZE/BANDAGES/DRESSINGS) IMPLANT
BINDER BREAST XLRG (GAUZE/BANDAGES/DRESSINGS) IMPLANT
CANISTER SUCT 3000ML PPV (MISCELLANEOUS) ×1 IMPLANT
CHLORAPREP W/TINT 26 (MISCELLANEOUS) ×1 IMPLANT
CLIP APPLIE 9.375 MED OPEN (MISCELLANEOUS) IMPLANT
COVER PROBE W GEL 5X96 (DRAPES) ×1 IMPLANT
COVER SURGICAL LIGHT HANDLE (MISCELLANEOUS) ×1 IMPLANT
DERMABOND ADVANCED .7 DNX12 (GAUZE/BANDAGES/DRESSINGS) ×1 IMPLANT
DEVICE DUBIN SPECIMEN MAMMOGRA (MISCELLANEOUS) ×1 IMPLANT
DRAPE CHEST BREAST 15X10 FENES (DRAPES) ×1 IMPLANT
ELECT COATED BLADE 2.86 ST (ELECTRODE) ×1 IMPLANT
ELECT REM PT RETURN 9FT ADLT (ELECTROSURGICAL) ×1 IMPLANT
ELECTRODE REM PT RTRN 9FT ADLT (ELECTROSURGICAL) ×1 IMPLANT
GLOVE BIO SURGEON STRL SZ7.5 (GLOVE) ×2 IMPLANT
GOWN STRL REUS W/ TWL LRG LVL3 (GOWN DISPOSABLE) ×2 IMPLANT
KIT BASIN OR (CUSTOM PROCEDURE TRAY) ×1 IMPLANT
KIT MARKER MARGIN INK (KITS) ×1 IMPLANT
LIGHT WAVEGUIDE WIDE FLAT (MISCELLANEOUS) IMPLANT
NDL HYPO 25GX1X1/2 BEV (NEEDLE) ×1 IMPLANT
NEEDLE HYPO 25GX1X1/2 BEV (NEEDLE) ×1 IMPLANT
NS IRRIG 1000ML POUR BTL (IV SOLUTION) ×1 IMPLANT
PACK GENERAL/GYN (CUSTOM PROCEDURE TRAY) ×1 IMPLANT
SUT MNCRL AB 4-0 PS2 18 (SUTURE) ×1 IMPLANT
SUT SILK 2 0 SH (SUTURE) IMPLANT
SUT VIC AB 3-0 SH 18 (SUTURE) ×1 IMPLANT
SYR CONTROL 10ML LL (SYRINGE) ×1 IMPLANT
TOWEL GREEN STERILE (TOWEL DISPOSABLE) ×1 IMPLANT
TOWEL GREEN STERILE FF (TOWEL DISPOSABLE) ×1 IMPLANT

## 2023-08-29 NOTE — Anesthesia Postprocedure Evaluation (Signed)
 Anesthesia Post Note  Patient: Candice Perez  Procedure(s) Performed: BREAST LUMPECTOMY WITH RADIOACTIVE SEED LOCALIZATION (Right: Breast)     Patient location during evaluation: PACU Anesthesia Type: General Level of consciousness: awake and alert Pain management: pain level controlled Vital Signs Assessment: post-procedure vital signs reviewed and stable Respiratory status: spontaneous breathing, nonlabored ventilation, respiratory function stable and patient connected to nasal cannula oxygen Cardiovascular status: blood pressure returned to baseline and stable Postop Assessment: no apparent nausea or vomiting Anesthetic complications: no   No notable events documented.  Last Vitals:  Vitals:   08/29/23 1011 08/29/23 1013  BP: 116/74   Pulse: 95 89  Resp: 10 12  Temp:  36.6 C  SpO2: 97% 97%    Last Pain:  Vitals:   08/29/23 0928  TempSrc:   PainSc: Asleep                 Valente Gaskin Kaleb Sek

## 2023-08-29 NOTE — H&P (Signed)
 REFERRING PHYSICIAN: Juleen China, MD PROVIDER: Lindell Noe, MD MRN: [id number] DOB: 1951/01/28 Subjective   Chief Complaint: New Consultation ( Small right breast mass retroareolar region)  History of Present Illness: Candice Perez is a 73 y.o. female who is seen today as an office consultation for evaluation of New Consultation ( Small right breast mass retroareolar region)  We are asked to see the patient in consultation by Dr. Rayetta Pigg to evaluate her for a new right breast cancer. The patient is a 73 year old white female who recently went for a routine screening mammogram. At that time she was found to have a 7 mm mass in the retroareolar lateral right breast. The lymph nodes of apparently looked normal although it is not included in the report. The mass was biopsied and came back as a grade 2 invasive ductal cancer that was ER and PR positive and HER2 negative with a Ki-67 of 10%. She does have a history of a stroke and is on Eliquis. She has some right sided weakness and speech deficits. She quit smoking in 2012.  Review of Systems: A complete review of systems was obtained from the patient. I have reviewed this information and discussed as appropriate with the patient. See HPI as well for other ROS.  ROS   Medical History: Past Medical History:  Diagnosis Date  Arrhythmia  CHF (congestive heart failure) (CMS/HHS-HCC)  Chronic kidney disease  DVT (deep venous thrombosis) (CMS/HHS-HCC)  History of stroke  Hyperlipidemia  Hypertension  Thyroid disease   There is no problem list on file for this patient.  Past Surgical History:  Procedure Laterality Date  CHOLECYSTECTOMY  HERNIA REPAIR  LAPAROSCOPIC TUBAL LIGATION  TONSILLECTOMY    No Known Allergies  Current Outpatient Medications on File Prior to Visit  Medication Sig Dispense Refill  acetaminophen (TYLENOL ARTHRITIS PAIN) 650 MG ER tablet Take 650 mg by mouth every 8 (eight) hours as needed for Pain   allopurinoL (ZYLOPRIM) 100 MG tablet Take 100 mg by mouth once daily  amLODIPine (NORVASC) 10 MG tablet Take 10 mg by mouth once daily  ascorbic acid,sod/zinc gluc,ox (ZINC AND C ORAL) Take by mouth  CARTIA XT 240 mg CD capsule Take 240 mg by mouth once daily  carvediloL (COREG) 6.25 MG tablet Take 6.25 mg by mouth 2 (two) times daily  cholecalciferol 1000 unit tablet Take by mouth  cyanocobalamin (VITAMIN B12) 1000 MCG tablet Take 1,000 mcg by mouth once daily  digoxin (LANOXIN) 0.125 MG tablet take 0.5 tablets (0.0625 mg total) by mouth daily  dilTIAZem (CARDIZEM CD) 120 MG XR capsule take 1 capsule by mouth once daily in the evening  ELIQUIS 5 mg tablet Take 5 mg by mouth 2 (two) times daily  gabapentin (NEURONTIN) 400 MG capsule Take 400 mg by mouth at bedtime  hydrALAZINE (APRESOLINE) 25 MG tablet Take 25 mg by mouth 2 (two) times daily  levothyroxine (SYNTHROID) 125 MCG tablet Take 125 mcg by mouth once daily  magnesium oxide 250 mg magnesium Tab Take by mouth  potassium chloride (KLOR-CON M20) 20 MEQ ER tablet Take 20 mEq by mouth once daily  rosuvastatin (CRESTOR) 20 MG tablet Take 20 mg by mouth once daily  TORsemide (DEMADEX) 20 MG tablet Take 40 mg by mouth 2 (two) times daily   No current facility-administered medications on file prior to visit.   Family History  Problem Relation Age of Onset  Other (mouth cancer) Mother  Stroke Sister  Breast cancer Sister    Social  History   Tobacco Use  Smoking Status Former  Types: Cigarettes  Smokeless Tobacco Not on file    Social History   Socioeconomic History  Marital status: Married  Tobacco Use  Smoking status: Former  Types: Cigarettes  Vaping Use  Vaping status: Unknown  Substance and Sexual Activity  Alcohol use: Not Currently  Drug use: Never   Social Drivers of Health   Food Insecurity: No Food Insecurity (11/15/2022)  Received from [facility]  Hunger Vital Sign  Worried About Running Out of Food in  the Last Year: Never true  Ran Out of Food in the Last Year: Never true  Transportation Needs: No Transportation Needs (11/15/2022)  Received from [facility]  Lack of Transportation (Medical): No  Lack of Transportation (Non-Medical): No  Housing Stability: Unknown (07/18/2023)  Housing Stability Vital Sign  Homeless in the Last Year: No   Objective:   Vitals:  BP: 117/78  Pulse: 86  Temp: 36.6 C (97.8 F)  SpO2: 94%  Weight: (!) 129.6 kg (285 lb 12.8 oz)  Height: 170.2 cm (5\' 7" )  PainSc: 0-No pain   Body mass index is 44.76 kg/m.  Physical Exam Vitals reviewed.  Constitutional:  General: She is not in acute distress. Appearance: Normal appearance.  HENT:  Head: Normocephalic and atraumatic.  Right Ear: External ear normal.  Left Ear: External ear normal.  Nose: Nose normal.  Mouth/Throat:  Mouth: Mucous membranes are moist.  Pharynx: Oropharynx is clear.  Eyes:  General: No scleral icterus. Extraocular Movements: Extraocular movements intact.  Conjunctiva/sclera: Conjunctivae normal.  Pupils: Pupils are equal, round, and reactive to light.  Cardiovascular:  Rate and Rhythm: Normal rate and regular rhythm.  Pulses: Normal pulses.  Heart sounds: Normal heart sounds.  Pulmonary:  Effort: Pulmonary effort is normal. No respiratory distress.  Breath sounds: Normal breath sounds.  Abdominal:  General: Bowel sounds are normal.  Palpations: Abdomen is soft.  Tenderness: There is no abdominal tenderness.  Musculoskeletal:  General: No swelling, tenderness or deformity. Normal range of motion.  Cervical back: Normal range of motion and neck supple.  Comments: Weak on the right side  Skin: General: Skin is warm and dry.  Coloration: Skin is not jaundiced.  Neurological:  General: No focal deficit present.  Mental Status: She is alert and oriented to person, place, and time.  Comments: She has some mild speech issues but communicates well   Psychiatric:  Mood and Affect: Mood normal.  Behavior: Behavior normal.     Breast: There is no palpable mass in either breast. There is no palpable axillary, supraclavicular, or cervical lymphadenopathy.  Labs, Imaging and Diagnostic Testing:  Assessment and Plan:   Diagnoses and all orders for this visit:  Malignant neoplasm of upper-outer quadrant of right breast in female, estrogen receptor positive (CMS/HHS-HCC) - Ambulatory Referral to Oncology-Medical - Ambulatory Referral to Radiation Oncology - [facility] Case Posting Request; Future   The patient appears to have a 7 mm cancer in the lateral retroareolar right breast with clinically negative nodes and all favorable markers. I have discussed with her in detail the different options for treatment and at this point she favors breast conservation which I feel is very reasonable. Especially since she may or may not be able to come off the Eliquis. Given her findings it is unlikely that she would need a node evaluation. I have discussed with her in detail the risks and benefits of the operation as well as some of  the technical aspects including the use of a radioactive seed for localization and she understands and wishes to proceed. We will need cardiac clearance prior to scheduling. I will go ahead and refer her to medical and radiation oncology to discuss adjuvant therapy.

## 2023-08-29 NOTE — Op Note (Signed)
 08/29/2023  9:19 AM  PATIENT:  Candice Perez  73 y.o. female  PRE-OPERATIVE DIAGNOSIS:  RIGHT BREAST CANCER  POST-OPERATIVE DIAGNOSIS:  RIGHT BREAST CANCER  PROCEDURE:  Procedure(s) with comments: RIGHT BREAST LUMPECTOMY WITH RADIOACTIVE SEED LOCALIZATION   SURGEON:  Surgeons and Role:    * Caralyn Chandler, MD - Primary  PHYSICIAN ASSISTANT:   ASSISTANTS: none   ANESTHESIA:   local and general  EBL:  5 mL   BLOOD ADMINISTERED:none  DRAINS: none   LOCAL MEDICATIONS USED:  MARCAINE     SPECIMEN:  Source of Specimen:  right breast tissue with additional anterior and posterior margin  DISPOSITION OF SPECIMEN:  PATHOLOGY  COUNTS:  YES  TOURNIQUET:  * No tourniquets in log *  DICTATION: .Dragon Dictation  After informed consent was obtained the patient was brought to the operating room and placed in the supine position on the operating table.  After adequate induction of general anesthesia the patient's right breast was prepped with ChloraPrep, allowed to dry, and draped in usual sterile manner.  An appropriate timeout was performed.  Previously an I-125 seed was placed in the medial subareolar right breast to mark an area of invasive breast cancer.  The neoprobe was set to I-125 in the area of radioactivity was readily identified.  The area around this was infiltrated with quarter percent Marcaine.  A curvilinear incision was made along the inner edge of the areola of the right breast with a 15 blade knife.  The incision was carried through the skin and subcutaneous tissue sharply with the electrocautery.  Dissection was then carried towards the radioactive seed under the direction of the neoprobe.  Once I more closely approach the radioactive seed I then removed a circular portion of breast tissue sharply with the electrocautery around the radioactive seed while checking the area of radioactivity frequently.  Once the tissue was removed it was oriented with the appropriate paint  colors.  A specimen radiograph was obtained that showed the clip and seed to be near the center of the specimen.  I did elect to take an additional anterior and posterior margin and these were marked appropriately.  All of the tissue was then sent to pathology for further evaluation.  Hemostasis was achieved using the Bovie electrocautery.  The wound was irrigated with saline and infiltrated with more quarter percent Marcaine.  The cavity was marked with clips.  The deep layer of the incision was then closed with layers of interrupted 3-0 Vicryl stitches.  The skin was then closed with interrupted 4-0 Monocryl subcuticular stitches.  Dermabond dressings were applied.  The patient tolerated the procedure well.  At the end of the case all needle sponge and instrument counts were correct.  The patient was then awakened and taken to recovery in stable condition.  PLAN OF CARE: Discharge to home after PACU  PATIENT DISPOSITION:  PACU - hemodynamically stable.   Delay start of Pharmacological VTE agent (>24hrs) due to surgical blood loss or risk of bleeding: not applicable

## 2023-08-29 NOTE — Transfer of Care (Signed)
 Immediate Anesthesia Transfer of Care Note  Patient: Candice Perez  Procedure(s) Performed: BREAST LUMPECTOMY WITH RADIOACTIVE SEED LOCALIZATION (Right: Breast)  Patient Location: PACU  Anesthesia Type:General  Level of Consciousness: awake and alert   Airway & Oxygen Therapy: Patient Spontanous Breathing and Patient connected to face mask oxygen  Post-op Assessment: Report given to RN and Post -op Vital signs reviewed and stable  Post vital signs: Reviewed and stable  Last Vitals:  Vitals Value Taken Time  BP 106/72 08/29/23 0928  Temp    Pulse 96 08/29/23 0930  Resp 12 08/29/23 0931  SpO2 78 % 08/29/23 0930  Vitals shown include unfiled device data.  Last Pain:  Vitals:   08/29/23 0722  TempSrc:   PainSc: 0-No pain         Complications: No notable events documented.

## 2023-08-29 NOTE — Interval H&P Note (Signed)
 History and Physical Interval Note:  08/29/2023 7:38 AM  Candice Perez  has presented today for surgery, with the diagnosis of RIGHT BREAST CANCER.  The various methods of treatment have been discussed with the patient and family. After consideration of risks, benefits and other options for treatment, the patient has consented to  Procedure(s) with comments: BREAST LUMPECTOMY WITH RADIOACTIVE SEED LOCALIZATION (Right) - RIGHT BREAST RADIOACTIVE SEED LOCALIZED LUMPECTOMY as a surgical intervention.  The patient's history has been reviewed, patient examined, no change in status, stable for surgery.  I have reviewed the patient's chart and labs.  Questions were answered to the patient's satisfaction.     Lillette Reid III

## 2023-08-29 NOTE — Progress Notes (Signed)
 Wedding ring on the left hand. Unable to remove. Dr. Stoltzfus is aware.

## 2023-08-29 NOTE — Anesthesia Procedure Notes (Signed)
 Procedure Name: LMA Insertion Date/Time: 08/29/2023 9:41 AM  Performed by: Bennett Brass, CRNAPre-anesthesia Checklist: Patient identified, Timeout performed, Emergency Drugs available and Suction available Patient Re-evaluated:Patient Re-evaluated prior to induction Oxygen Delivery Method: Circle system utilized Preoxygenation: Pre-oxygenation with 100% oxygen Induction Type: IV induction LMA Size: 4.0 Number of attempts: 1

## 2023-08-30 ENCOUNTER — Encounter (HOSPITAL_COMMUNITY): Payer: Self-pay | Admitting: General Surgery

## 2023-08-30 NOTE — Progress Notes (Signed)
 Phone contact with pt. Pt reports that her surgery went well yesterday and she is having no pain. She reports that she has some soreness.. Pt is scheduled for post op follow up on May 4th, 2025. Encouraged pt to call with questions or concerns.

## 2023-08-31 LAB — SURGICAL PATHOLOGY

## 2023-09-03 ENCOUNTER — Encounter: Payer: Self-pay | Admitting: *Deleted

## 2023-09-06 ENCOUNTER — Encounter: Payer: Self-pay | Admitting: *Deleted

## 2023-09-11 DIAGNOSIS — N6341 Unspecified lump in right breast, subareolar: Secondary | ICD-10-CM | POA: Diagnosis not present

## 2023-09-12 ENCOUNTER — Encounter: Payer: Self-pay | Admitting: General Surgery

## 2023-09-12 ENCOUNTER — Encounter: Payer: Self-pay | Admitting: *Deleted

## 2023-09-12 ENCOUNTER — Inpatient Hospital Stay: Attending: Hematology and Oncology | Admitting: Hematology and Oncology

## 2023-09-12 DIAGNOSIS — Z1732 Human epidermal growth factor receptor 2 negative status: Secondary | ICD-10-CM | POA: Diagnosis not present

## 2023-09-12 DIAGNOSIS — C50411 Malignant neoplasm of upper-outer quadrant of right female breast: Secondary | ICD-10-CM | POA: Diagnosis not present

## 2023-09-12 DIAGNOSIS — Z170 Estrogen receptor positive status [ER+]: Secondary | ICD-10-CM

## 2023-09-12 NOTE — Assessment & Plan Note (Addendum)
 [facility]:Screening mammogram detected right breast mass 7 mm retroareolar, axilla negative, biopsy: Grade 2 IDC with apocrine features, LVI not identified, ER 100%, PR 100%, Ki67 10%, HER2 1+ IHC negative Prior history of stroke  08/29/2023: Right lumpectomy: Grade 2 IDC 0.8 cm, margins negative, ER 100%, PR 100%, HER2 1+ negative, Ki-67 10%  Pathology counseling: I discussed the final pathology report of the patient provided  a copy of this report. I discussed the margins as well as lymph node surgeries. We also discussed the final staging along with previously performed ER/PR and HER-2/neu testing.  Treatment plan: +/- Adjuvant radiation therapy followed by Adjuvant antiestrogen therapy  Oncotype not being considered because of her prior history of stroke and CHF  Return to clinic after radiation is completed

## 2023-09-12 NOTE — Progress Notes (Signed)
 HEMATOLOGY-ONCOLOGY TELEPHONE VISIT PROGRESS NOTE  I connected with our patient on 09/12/23 at  2:30 PM EDT by telephone and verified that I am speaking with the correct person using two identifiers.  I discussed the limitations, risks, security and privacy concerns of performing an evaluation and management service by telephone and the availability of in person appointments.  I also discussed with the patient that there may be a patient responsible charge related to this service. The patient expressed understanding and agreed to proceed.   History of Present Illness:   History of Present Illness Candice Perez is a 73 year old female with breast cancer who presents for post-surgical follow-up.  She is recovering well after surgery for an estrogen receptor-positive breast tumor, which was excised with clear margins. The tumor was approximately pea-sized. She has experienced no pain or other symptoms post-surgery. She plans to start an anti-estrogen pill soon. She relies on others for transportation for follow-up care.    Oncology History  Malignant neoplasm of upper-outer quadrant of right breast in female, estrogen receptor positive (HCC)  07/10/2023 Initial Diagnosis   Screening mammogram detected right breast mass 7 mm retroareolar, axilla negative, biopsy: Grade 2 IDC with apocrine features, LVI not identified, ER 100%, PR 100%, Ki67 10%, HER2 1+ IHC negative   07/25/2023 Cancer Staging   Staging form: Breast, AJCC 8th Edition - Clinical: Stage IA (cT1b, cN0, cM0, G2, ER+, PR+, HER2-) - Signed by Cameron Cea, MD on 07/25/2023 Histologic grading system: 3 grade system   08/29/2023 Surgery   Right lumpectomy: Grade 2 IDC 0.8 cm, margins negative, ER 100%, PR 100%, HER2 1+ negative, Ki-67 10%      REVIEW OF SYSTEMS:   Constitutional: Denies fevers, chills or abnormal weight loss All other systems were reviewed with the patient and are negative. Observations/Objective:      Assessment Plan:  Malignant neoplasm of upper-outer quadrant of right breast in female, estrogen receptor positive (HCC) 07/10/2023: [facility]:Screening mammogram detected right breast mass 7 mm retroareolar, axilla negative, biopsy: Grade 2 IDC with apocrine features, LVI not identified, ER 100%, PR 100%, Ki67 10%, HER2 1+ IHC negative Prior history of stroke  08/29/2023: Right lumpectomy: Grade 2 IDC 0.8 cm, margins negative, ER 100%, PR 100%, HER2 1+ negative, Ki-67 10%  Pathology counseling: I discussed the final pathology report of the patient provided  a copy of this report. I discussed the margins as well as lymph node surgeries. We also discussed the final staging along with previously performed ER/PR and HER-2/neu testing.  Treatment plan: Patient is not interested in radiation.  Given her age and performance status and favorable prognostic profile, the benefits of radiation are small. Adjuvant antiestrogen therapy with Anastrozole 1 mg po daily: I sent in the prescription to her pharmacy.    Telephone visit in 3 months for follow up  Assessment & Plan Malignant neoplasm of right breast Post-surgical status following removal of estrogen receptor-positive tumor with clear margins. Awaiting radiation oncology consultation. - Consult radiation oncology to assess need for radiation therapy. - Prescribe anti-estrogen medication and send prescription to pharmacy. - Advise starting anti-estrogen therapy within two weeks. - Schedule follow-up call in three months to assess response to anti-estrogen therapy.      I discussed the assessment and treatment plan with the patient. The patient was provided an opportunity to ask questions and all were answered. The patient agreed with the plan and demonstrated an understanding of the instructions. The patient was advised to  call back or seek an in-person evaluation if the symptoms worsen or if the condition fails to improve as  anticipated.   I provided 20 minutes of non-face-to-face time during this encounter.  This includes time for charting and coordination of care   Margert Sheerer, MD

## 2023-09-13 ENCOUNTER — Telehealth: Payer: Self-pay | Admitting: Hematology and Oncology

## 2023-09-13 NOTE — Telephone Encounter (Signed)
 Confirmed with pt scheduled telephone appt time and date

## 2023-09-17 ENCOUNTER — Ambulatory Visit: Payer: Self-pay | Admitting: Radiation Oncology

## 2023-09-17 ENCOUNTER — Ambulatory Visit
Admission: RE | Admit: 2023-09-17 | Discharge: 2023-09-17 | Disposition: A | Discharge status: Home / Self Care | Source: Ambulatory Visit | Attending: Radiation Oncology | Admitting: Radiation Oncology

## 2023-09-17 ENCOUNTER — Ambulatory Visit: Admitting: Radiation Oncology

## 2023-09-17 DIAGNOSIS — Z510 Encounter for antineoplastic radiation therapy: Secondary | ICD-10-CM | POA: Diagnosis not present

## 2023-09-17 DIAGNOSIS — Z170 Estrogen receptor positive status [ER+]: Secondary | ICD-10-CM | POA: Diagnosis not present

## 2023-09-17 DIAGNOSIS — C50411 Malignant neoplasm of upper-outer quadrant of right female breast: Secondary | ICD-10-CM | POA: Diagnosis not present

## 2023-09-17 DIAGNOSIS — R300 Dysuria: Secondary | ICD-10-CM | POA: Insufficient documentation

## 2023-09-17 DIAGNOSIS — Z87891 Personal history of nicotine dependence: Secondary | ICD-10-CM | POA: Insufficient documentation

## 2023-09-20 DIAGNOSIS — Z170 Estrogen receptor positive status [ER+]: Secondary | ICD-10-CM | POA: Diagnosis not present

## 2023-09-20 DIAGNOSIS — C50411 Malignant neoplasm of upper-outer quadrant of right female breast: Secondary | ICD-10-CM | POA: Diagnosis not present

## 2023-09-20 DIAGNOSIS — Z87891 Personal history of nicotine dependence: Secondary | ICD-10-CM | POA: Diagnosis not present

## 2023-09-20 DIAGNOSIS — Z510 Encounter for antineoplastic radiation therapy: Secondary | ICD-10-CM | POA: Diagnosis not present

## 2023-09-20 DIAGNOSIS — R300 Dysuria: Secondary | ICD-10-CM | POA: Diagnosis not present

## 2023-09-24 ENCOUNTER — Encounter: Payer: Self-pay | Admitting: *Deleted

## 2023-09-24 DIAGNOSIS — Z170 Estrogen receptor positive status [ER+]: Secondary | ICD-10-CM

## 2023-09-26 ENCOUNTER — Other Ambulatory Visit: Payer: Self-pay

## 2023-09-26 ENCOUNTER — Ambulatory Visit
Admission: RE | Admit: 2023-09-26 | Discharge: 2023-09-26 | Disposition: A | Discharge status: Home / Self Care | Source: Ambulatory Visit | Attending: Radiation Oncology | Admitting: Radiation Oncology

## 2023-09-26 DIAGNOSIS — Z170 Estrogen receptor positive status [ER+]: Secondary | ICD-10-CM | POA: Diagnosis not present

## 2023-09-26 DIAGNOSIS — C50411 Malignant neoplasm of upper-outer quadrant of right female breast: Secondary | ICD-10-CM | POA: Diagnosis not present

## 2023-09-26 DIAGNOSIS — Z510 Encounter for antineoplastic radiation therapy: Secondary | ICD-10-CM | POA: Diagnosis not present

## 2023-09-26 DIAGNOSIS — Z87891 Personal history of nicotine dependence: Secondary | ICD-10-CM | POA: Diagnosis not present

## 2023-09-26 DIAGNOSIS — R300 Dysuria: Secondary | ICD-10-CM | POA: Diagnosis not present

## 2023-09-26 LAB — RAD ONC ARIA SESSION SUMMARY
Course Elapsed Days: 0
Plan Fractions Treated to Date: 1
Plan Prescribed Dose Per Fraction: 2.66 Gy
Plan Total Fractions Prescribed: 16
Plan Total Prescribed Dose: 42.56 Gy
Reference Point Dosage Given to Date: 2.66 Gy
Reference Point Session Dosage Given: 2.66 Gy
Session Number: 1

## 2023-09-27 ENCOUNTER — Other Ambulatory Visit: Payer: Self-pay

## 2023-09-27 ENCOUNTER — Ambulatory Visit
Admission: RE | Admit: 2023-09-27 | Discharge: 2023-09-27 | Disposition: A | Discharge status: Home / Self Care | Source: Ambulatory Visit | Attending: Radiation Oncology

## 2023-09-27 DIAGNOSIS — R300 Dysuria: Secondary | ICD-10-CM | POA: Diagnosis not present

## 2023-09-27 DIAGNOSIS — C50411 Malignant neoplasm of upper-outer quadrant of right female breast: Secondary | ICD-10-CM | POA: Diagnosis not present

## 2023-09-27 DIAGNOSIS — Z6841 Body Mass Index (BMI) 40.0 and over, adult: Secondary | ICD-10-CM | POA: Diagnosis not present

## 2023-09-27 DIAGNOSIS — Z170 Estrogen receptor positive status [ER+]: Secondary | ICD-10-CM | POA: Diagnosis not present

## 2023-09-27 DIAGNOSIS — Z510 Encounter for antineoplastic radiation therapy: Secondary | ICD-10-CM | POA: Diagnosis not present

## 2023-09-27 DIAGNOSIS — C50911 Malignant neoplasm of unspecified site of right female breast: Secondary | ICD-10-CM | POA: Diagnosis not present

## 2023-09-27 DIAGNOSIS — Z87891 Personal history of nicotine dependence: Secondary | ICD-10-CM | POA: Diagnosis not present

## 2023-09-27 DIAGNOSIS — I4891 Unspecified atrial fibrillation: Secondary | ICD-10-CM | POA: Diagnosis not present

## 2023-09-27 LAB — RAD ONC ARIA SESSION SUMMARY
Course Elapsed Days: 1
Plan Fractions Treated to Date: 2
Plan Prescribed Dose Per Fraction: 2.66 Gy
Plan Total Fractions Prescribed: 16
Plan Total Prescribed Dose: 42.56 Gy
Reference Point Dosage Given to Date: 5.32 Gy
Reference Point Session Dosage Given: 2.66 Gy
Session Number: 2

## 2023-09-28 ENCOUNTER — Other Ambulatory Visit: Payer: Self-pay

## 2023-09-28 ENCOUNTER — Ambulatory Visit
Admission: RE | Admit: 2023-09-28 | Discharge: 2023-09-28 | Disposition: A | Discharge status: Home / Self Care | Source: Ambulatory Visit | Attending: Radiation Oncology | Admitting: Radiation Oncology

## 2023-09-28 DIAGNOSIS — Z87891 Personal history of nicotine dependence: Secondary | ICD-10-CM | POA: Diagnosis not present

## 2023-09-28 DIAGNOSIS — R300 Dysuria: Secondary | ICD-10-CM | POA: Diagnosis not present

## 2023-09-28 DIAGNOSIS — C50411 Malignant neoplasm of upper-outer quadrant of right female breast: Secondary | ICD-10-CM | POA: Diagnosis not present

## 2023-09-28 DIAGNOSIS — Z170 Estrogen receptor positive status [ER+]: Secondary | ICD-10-CM | POA: Diagnosis not present

## 2023-09-28 DIAGNOSIS — Z510 Encounter for antineoplastic radiation therapy: Secondary | ICD-10-CM | POA: Diagnosis not present

## 2023-09-28 LAB — RAD ONC ARIA SESSION SUMMARY
Course Elapsed Days: 2
Plan Fractions Treated to Date: 3
Plan Prescribed Dose Per Fraction: 2.66 Gy
Plan Total Fractions Prescribed: 16
Plan Total Prescribed Dose: 42.56 Gy
Reference Point Dosage Given to Date: 7.98 Gy
Reference Point Session Dosage Given: 2.66 Gy
Session Number: 3

## 2023-10-01 ENCOUNTER — Other Ambulatory Visit: Payer: Self-pay | Admitting: *Deleted

## 2023-10-01 ENCOUNTER — Ambulatory Visit
Admission: RE | Admit: 2023-10-01 | Discharge: 2023-10-01 | Disposition: A | Discharge status: Home / Self Care | Source: Ambulatory Visit | Attending: Radiation Oncology | Admitting: Radiation Oncology

## 2023-10-01 ENCOUNTER — Other Ambulatory Visit: Payer: Self-pay

## 2023-10-01 DIAGNOSIS — Z510 Encounter for antineoplastic radiation therapy: Secondary | ICD-10-CM | POA: Diagnosis not present

## 2023-10-01 DIAGNOSIS — Z87891 Personal history of nicotine dependence: Secondary | ICD-10-CM | POA: Diagnosis not present

## 2023-10-01 DIAGNOSIS — R300 Dysuria: Secondary | ICD-10-CM | POA: Diagnosis not present

## 2023-10-01 DIAGNOSIS — C50411 Malignant neoplasm of upper-outer quadrant of right female breast: Secondary | ICD-10-CM | POA: Diagnosis not present

## 2023-10-01 DIAGNOSIS — Z170 Estrogen receptor positive status [ER+]: Secondary | ICD-10-CM | POA: Diagnosis not present

## 2023-10-01 LAB — RAD ONC ARIA SESSION SUMMARY
Course Elapsed Days: 5
Plan Fractions Treated to Date: 4
Plan Prescribed Dose Per Fraction: 2.66 Gy
Plan Total Fractions Prescribed: 16
Plan Total Prescribed Dose: 42.56 Gy
Reference Point Dosage Given to Date: 10.64 Gy
Reference Point Session Dosage Given: 2.66 Gy
Session Number: 4

## 2023-10-01 MED ORDER — ANASTROZOLE 1 MG PO TABS
1.0000 mg | ORAL_TABLET | Freq: Every day | ORAL | 3 refills | Status: AC
  Filled 2024-03-20: qty 30, 30d supply, fill #0
  Filled 2024-03-20: qty 60, 60d supply, fill #0
  Filled 2024-06-18: qty 90, 90d supply, fill #1

## 2023-10-02 ENCOUNTER — Other Ambulatory Visit: Payer: Self-pay

## 2023-10-02 ENCOUNTER — Ambulatory Visit
Admission: RE | Admit: 2023-10-02 | Discharge: 2023-10-02 | Disposition: A | Discharge status: Home / Self Care | Source: Ambulatory Visit | Attending: Radiation Oncology | Admitting: Radiation Oncology

## 2023-10-02 DIAGNOSIS — R300 Dysuria: Secondary | ICD-10-CM | POA: Diagnosis not present

## 2023-10-02 DIAGNOSIS — Z510 Encounter for antineoplastic radiation therapy: Secondary | ICD-10-CM | POA: Diagnosis not present

## 2023-10-02 DIAGNOSIS — Z170 Estrogen receptor positive status [ER+]: Secondary | ICD-10-CM | POA: Diagnosis not present

## 2023-10-02 DIAGNOSIS — C50411 Malignant neoplasm of upper-outer quadrant of right female breast: Secondary | ICD-10-CM | POA: Diagnosis not present

## 2023-10-02 DIAGNOSIS — Z87891 Personal history of nicotine dependence: Secondary | ICD-10-CM | POA: Diagnosis not present

## 2023-10-02 LAB — RAD ONC ARIA SESSION SUMMARY
Course Elapsed Days: 6
Plan Fractions Treated to Date: 5
Plan Prescribed Dose Per Fraction: 2.66 Gy
Plan Total Fractions Prescribed: 16
Plan Total Prescribed Dose: 42.56 Gy
Reference Point Dosage Given to Date: 13.3 Gy
Reference Point Session Dosage Given: 2.66 Gy
Session Number: 5

## 2023-10-03 ENCOUNTER — Ambulatory Visit
Admission: RE | Admit: 2023-10-03 | Discharge: 2023-10-03 | Disposition: A | Discharge status: Home / Self Care | Source: Ambulatory Visit | Attending: Radiation Oncology | Admitting: Radiation Oncology

## 2023-10-03 ENCOUNTER — Other Ambulatory Visit: Payer: Self-pay

## 2023-10-03 DIAGNOSIS — Z170 Estrogen receptor positive status [ER+]: Secondary | ICD-10-CM | POA: Diagnosis not present

## 2023-10-03 DIAGNOSIS — Z87891 Personal history of nicotine dependence: Secondary | ICD-10-CM | POA: Diagnosis not present

## 2023-10-03 DIAGNOSIS — R300 Dysuria: Secondary | ICD-10-CM | POA: Diagnosis not present

## 2023-10-03 DIAGNOSIS — Z510 Encounter for antineoplastic radiation therapy: Secondary | ICD-10-CM | POA: Diagnosis not present

## 2023-10-03 DIAGNOSIS — C50411 Malignant neoplasm of upper-outer quadrant of right female breast: Secondary | ICD-10-CM | POA: Diagnosis not present

## 2023-10-03 LAB — RAD ONC ARIA SESSION SUMMARY
Course Elapsed Days: 7
Plan Fractions Treated to Date: 6
Plan Prescribed Dose Per Fraction: 2.66 Gy
Plan Total Fractions Prescribed: 16
Plan Total Prescribed Dose: 42.56 Gy
Reference Point Dosage Given to Date: 15.96 Gy
Reference Point Session Dosage Given: 2.66 Gy
Session Number: 6

## 2023-10-04 ENCOUNTER — Ambulatory Visit
Admission: RE | Admit: 2023-10-04 | Discharge: 2023-10-04 | Disposition: A | Discharge status: Home / Self Care | Source: Ambulatory Visit | Attending: Radiation Oncology | Admitting: Radiation Oncology

## 2023-10-04 ENCOUNTER — Other Ambulatory Visit: Payer: Self-pay

## 2023-10-04 DIAGNOSIS — Z170 Estrogen receptor positive status [ER+]: Secondary | ICD-10-CM | POA: Diagnosis not present

## 2023-10-04 DIAGNOSIS — Z87891 Personal history of nicotine dependence: Secondary | ICD-10-CM | POA: Diagnosis not present

## 2023-10-04 DIAGNOSIS — C50411 Malignant neoplasm of upper-outer quadrant of right female breast: Secondary | ICD-10-CM | POA: Diagnosis not present

## 2023-10-04 DIAGNOSIS — R300 Dysuria: Secondary | ICD-10-CM | POA: Diagnosis not present

## 2023-10-04 DIAGNOSIS — Z510 Encounter for antineoplastic radiation therapy: Secondary | ICD-10-CM | POA: Diagnosis not present

## 2023-10-04 LAB — RAD ONC ARIA SESSION SUMMARY
Course Elapsed Days: 8
Plan Fractions Treated to Date: 7
Plan Prescribed Dose Per Fraction: 2.66 Gy
Plan Total Fractions Prescribed: 16
Plan Total Prescribed Dose: 42.56 Gy
Reference Point Dosage Given to Date: 18.62 Gy
Reference Point Session Dosage Given: 2.66 Gy
Session Number: 7

## 2023-10-05 ENCOUNTER — Ambulatory Visit
Admission: RE | Admit: 2023-10-05 | Discharge: 2023-10-05 | Disposition: A | Discharge status: Home / Self Care | Source: Ambulatory Visit | Attending: Radiation Oncology | Admitting: Radiation Oncology

## 2023-10-05 ENCOUNTER — Encounter (HOSPITAL_BASED_OUTPATIENT_CLINIC_OR_DEPARTMENT_OTHER): Payer: Self-pay

## 2023-10-05 ENCOUNTER — Ambulatory Visit (HOSPITAL_BASED_OUTPATIENT_CLINIC_OR_DEPARTMENT_OTHER)
Admission: EM | Admit: 2023-10-05 | Discharge: 2023-10-05 | Disposition: A | Discharge status: Home / Self Care | Attending: Family Medicine | Admitting: Family Medicine

## 2023-10-05 ENCOUNTER — Other Ambulatory Visit (HOSPITAL_BASED_OUTPATIENT_CLINIC_OR_DEPARTMENT_OTHER): Payer: Self-pay

## 2023-10-05 ENCOUNTER — Other Ambulatory Visit: Payer: Self-pay

## 2023-10-05 DIAGNOSIS — R300 Dysuria: Secondary | ICD-10-CM | POA: Diagnosis not present

## 2023-10-05 DIAGNOSIS — N3001 Acute cystitis with hematuria: Secondary | ICD-10-CM

## 2023-10-05 DIAGNOSIS — Z170 Estrogen receptor positive status [ER+]: Secondary | ICD-10-CM | POA: Diagnosis not present

## 2023-10-05 DIAGNOSIS — Z87891 Personal history of nicotine dependence: Secondary | ICD-10-CM | POA: Diagnosis not present

## 2023-10-05 DIAGNOSIS — C50411 Malignant neoplasm of upper-outer quadrant of right female breast: Secondary | ICD-10-CM | POA: Diagnosis not present

## 2023-10-05 DIAGNOSIS — Z510 Encounter for antineoplastic radiation therapy: Secondary | ICD-10-CM | POA: Diagnosis not present

## 2023-10-05 LAB — POCT URINALYSIS DIP (MANUAL ENTRY)
Bilirubin, UA: NEGATIVE
Glucose, UA: NEGATIVE mg/dL
Ketones, POC UA: NEGATIVE mg/dL
Nitrite, UA: NEGATIVE
Protein Ur, POC: 100 mg/dL — AB
Spec Grav, UA: 1.02 (ref 1.010–1.025)
Urobilinogen, UA: 0.2 U/dL
pH, UA: 6 (ref 5.0–8.0)

## 2023-10-05 LAB — RAD ONC ARIA SESSION SUMMARY
Course Elapsed Days: 9
Plan Fractions Treated to Date: 8
Plan Prescribed Dose Per Fraction: 2.66 Gy
Plan Total Fractions Prescribed: 16
Plan Total Prescribed Dose: 42.56 Gy
Reference Point Dosage Given to Date: 21.28 Gy
Reference Point Session Dosage Given: 2.66 Gy
Session Number: 8

## 2023-10-05 MED ORDER — CEPHALEXIN 500 MG PO CAPS
500.0000 mg | ORAL_CAPSULE | Freq: Two times a day (BID) | ORAL | 0 refills | Status: AC
  Filled 2023-10-05: qty 10, 5d supply, fill #0

## 2023-10-05 NOTE — ED Provider Notes (Signed)
 [facility]    CSN: [id number] Arrival date & time: 10/05/23  1119      History   Chief Complaint Chief Complaint  Patient presents with   Dysuria    HPI Candice Perez is a 73 y.o. female.   73 year old female presents today with dysuria.  This has been present and consistent over the past 3 days.  She has also had some frequency of urination and slight chills.  History of UTIs in the past.  Denies any associated flank pain, abdominal pain, fevers, nausea or vomiting.   Dysuria   Past Medical History:  Diagnosis Date   A-fib [facility])    Arthritis    Breast cancer (HCC) 07/2023   DVT (deep venous thrombosis) (HCC)    Dyspnea    Dysrhythmia    a-fib   Edema    GERD (gastroesophageal reflux disease)    OCCASIONAL - PRILOSEC IF NEEDED   Hyperlipidemia    Hypertension    no meds now   Hypothyroidism    Stroke (HCC) 2022   r side weakness (hand)   Varicose veins     Patient Active Problem List   Diagnosis Date Noted   Malignant neoplasm of upper-outer quadrant of right breast in female, estrogen receptor positive (HCC) 07/25/2023   CHF (congestive heart failure) (HCC) 11/15/2022   Symptomatic bradycardia 08/17/2021   Dyspnea 08/17/2021   AKI (acute kidney injury) (HCC) 08/17/2021   Paroxysmal A-fib (HCC) 08/17/2021   History of stroke 08/17/2021   HLD (hyperlipidemia) 08/17/2021   Hypothyroidism 08/17/2021   Chronic systolic CHF (congestive heart failure) (HCC) 08/17/2021   Cerebrovascular accident (CVA) due to embolism of left middle cerebral artery (HCC) 03/29/2021   Right leg pain 03/29/2021   Snoring 03/29/2021   Right hemiparesis (HCC)    Essential hypertension    Sinus tachycardia    Acute lower UTI    Left middle cerebral artery stroke (HCC) 12/09/2020   Right hemiplegia (HCC) 12/09/2020   Acute respiratory failure with hypoxia (HCC)    Cerebrovascular accident (CVA) (HCC) 12/02/2020   Middle cerebral artery embolism, left 12/02/2020    TIA (transient ischemic attack) 06/15/2017   Morbid obesity (HCC) 08/27/2013   Expected blood loss anemia 08/27/2013   S/P left TKA 08/26/2013   Deep vein thrombosis of left lower extremity (HCC) 01/03/2013   Varicose veins of bilateral lower extremities with other complications 05/01/2011   Chronic venous insufficiency 12/09/2010    Past Surgical History:  Procedure Laterality Date   APPENDECTOMY  1978   BREAST BIOPSY  08/28/2023   MM RT RADIOACTIVE SEED LOC MAMMO GUIDE 08/28/2023 [facility] MAMMOGRAPHY   BREAST LUMPECTOMY WITH RADIOACTIVE SEED LOCALIZATION Right 08/29/2023   Procedure: BREAST LUMPECTOMY WITH RADIOACTIVE SEED LOCALIZATION;  Surgeon: Caralyn Chandler, MD;  Location: [facility] OR;  Service: General;  Laterality: Right;  RIGHT BREAST RADIOACTIVE SEED LOCALIZED LUMPECTOMY   CHOLECYSTECTOMY     Laparoscopic   CYSTOCELE REPAIR     HERNIA REPAIR     IR CT HEAD LTD  12/02/2020   IR PERCUTANEOUS ART THROMBECTOMY/INFUSION INTRACRANIAL INC DIAG ANGIO  12/02/2020   RADIOLOGY WITH ANESTHESIA N/A 12/02/2020   Procedure: IR WITH ANESTHESIA;  Surgeon: Luellen Sages, MD;  Location: [facility] OR;  Service: Radiology;  Laterality: N/A;   RIGHT/LEFT HEART CATH AND CORONARY ANGIOGRAPHY N/A 11/20/2022   Procedure: RIGHT/LEFT HEART CATH AND CORONARY ANGIOGRAPHY;  Surgeon: Pasqual Bone, MD;  Location: [facility] INVASIVE CV LAB;  Service: Cardiovascular;  Laterality: N/A;  TONSILLECTOMY     TOTAL KNEE ARTHROPLASTY Left 08/26/2013   Procedure: LEFT TOTAL KNEE REVISION;  Surgeon: Bevin Bucks, MD;  Location: [facility] ORS;  Service: Orthopedics;  Laterality: Left;   VARICOSE VEIN SURGERY      OB History   No obstetric history on file.      Home Medications    Prior to Admission medications   Medication Sig Start Date End Date Taking? Authorizing Provider  cephALEXin (KEFLEX) 500 MG capsule Take 1 capsule (500 mg total) by mouth 2 (two) times daily for 5 days. 10/05/23 10/10/23 Yes Jeramy Dimmick A, FNP   acetaminophen  (TYLENOL ) 650 MG CR tablet Take 1,300 mg by mouth in the morning and at bedtime. In the morning & with lunch    [provider]  allopurinol  (ZYLOPRIM ) 100 MG tablet Take 1 tablet (100 mg total) by mouth daily. 11/24/22   Pasqual Bone, MD  anastrozole  (ARIMIDEX ) 1 MG tablet Take 1 tablet (1 mg total) by mouth daily. 10/01/23   Gudena, Vinay, MD  apixaban  (ELIQUIS ) 5 MG TABS tablet Take 1 tablet (5 mg total) by mouth 2 (two) times daily. 12/27/20   Angiulli, Everlyn Hockey, PA-C  Ascorbic Acid (VITAMIN C) 500 MG CAPS Take 500 mg by mouth in the morning.    [provider]  carvedilol  (COREG ) 12.5 MG tablet Take 1 tablet (12.5 mg total) by mouth 2 (two) times daily with a meal. Patient taking differently: Take 12.5 mg by mouth in the morning. 11/24/22   Pasqual Bone, MD  carvedilol  (COREG ) 6.25 MG tablet Take 6.25 mg by mouth every evening.    [provider]  Cholecalciferol (VITAMIN D3) 1000 units CAPS Take 1,000 Units by mouth in the morning.    [provider]  cyanocobalamin (VITAMIN B12) 1000 MCG tablet Take 1,000 mcg by mouth in the morning.    [provider]  digoxin  (LANOXIN ) 0.125 MG tablet Take 0.5 tablets (0.0625 mg total) by mouth daily. 11/24/22   Pasqual Bone, MD  DILT-XR 240 MG 24 hr capsule Take 240 mg by mouth every morning. 07/09/23   [provider]  diltiazem  (CARDIZEM  CD) 120 MG 24 hr capsule Take 1 capsule (120 mg total) by mouth daily. Patient taking differently: Take 120 mg by mouth at bedtime. 11/24/22   Pasqual Bone, MD  gabapentin  (NEURONTIN ) 400 MG capsule Take 1 capsule (400 mg total) by mouth at bedtime. 09/26/22   Raulkar, Keven Pel, MD  KLOR-CON  M20 20 MEQ tablet Take 20 mEq by mouth in the morning. 05/22/22   [provider]  levothyroxine  (SYNTHROID ) 125 MCG tablet Take 1 tablet (125 mcg total) by mouth daily at 6 (six) AM. 11/25/22   Pasqual Bone, MD  Magnesium  Oxide -Mg Supplement 250 MG TABS Take  250 mg by mouth in the morning.    [provider]  Multiple Vitamins-Minerals (MULTIVITAMIN WOMEN) TABS Take 1 tablet by mouth in the morning.    [provider]  oxyCODONE  (ROXICODONE ) 5 MG immediate release tablet Take 1 tablet (5 mg total) by mouth every 6 (six) hours as needed for severe pain (pain score 7-10). 08/29/23   Lillette Reid III, MD  rosuvastatin  (CRESTOR ) 20 MG tablet Take 1 tablet (20 mg total) by mouth daily. 12/27/20   Angiulli, Everlyn Hockey, PA-C  TART CHERRY PO Take 1 tablet by mouth 3 (three) times daily.    [provider]  torsemide  (DEMADEX ) 20 MG tablet Take 20 mg by mouth in the  morning and at bedtime.    [provider]  Zinc 50 MG TABS Take 50 mg by mouth in the morning.    [provider]    Family History Family History  Problem Relation Age of Onset   Congestive Heart Failure Mother    Cancer Mother        tongue/sinus   Cancer Father        "in his back"   Other Father        Varicose veins, Arteriosclerosis   Breast cancer Sister     Social History Social History   Tobacco Use   Smoking status: Former    Current packs/day: 0.00    Types: Cigarettes    Start date: 09/17/1970    Quit date: 09/17/2010    Years since quitting: 13.0   Smokeless tobacco: Never  Vaping Use   Vaping status: Never Used  Substance Use Topics   Alcohol use: No   Drug use: No     Allergies   Patient has no known allergies.   Review of Systems Review of Systems  Genitourinary:  Positive for dysuria.   See HPI  Physical Exam Triage Vital Signs ED Triage Vitals  Encounter Vitals Group     BP 10/05/23 1129 123/73     Systolic BP Percentile --      Diastolic BP Percentile --      Pulse Rate 10/05/23 1129 73     Resp 10/05/23 1129 20     Temp 10/05/23 1129 97.8 F (36.6 C)     Temp Source 10/05/23 1129 Oral     SpO2 10/05/23 1129 94 %     Weight --      Height --      Head Circumference --      Peak Flow --      Pain  Score 10/05/23 1131 6     Pain Loc --      Pain Education --      Exclude from Growth Chart --    No data found.  Updated Vital Signs BP 123/73 (BP Location: Right Arm)   Pulse 73   Temp 97.8 F (36.6 C) (Oral)   Resp 20   SpO2 94%   Visual Acuity Right Eye Distance:   Left Eye Distance:   Bilateral Distance:    Right Eye Near:   Left Eye Near:    Bilateral Near:     Physical Exam Vitals and nursing note reviewed.  Constitutional:      General: She is not in acute distress.    Appearance: Normal appearance. She is not ill-appearing, toxic-appearing or diaphoretic.  Pulmonary:     Effort: Pulmonary effort is normal.  Neurological:     Mental Status: She is alert.  Psychiatric:        Mood and Affect: Mood normal.      UC Treatments / Results  Labs (all labs ordered are listed, but only abnormal results are displayed) Labs Reviewed  POCT URINALYSIS DIP (MANUAL ENTRY) - Abnormal; Notable for the following components:      Result Value   Clarity, UA cloudy (*)    Blood, UA large (*)    Protein Ur, POC =100 (*)    Leukocytes, UA Large (3+) (*)    All other components within normal limits  URINE CULTURE    EKG   Radiology No results found.  Procedures Procedures (including critical care time)  Medications Ordered in UC Medications -  No data to display  Initial Impression / Assessment and Plan / UC Course  I have reviewed the triage vital signs and the nursing notes.  Pertinent labs & imaging results that were available during my care of the patient were reviewed by me and considered in my medical decision making (see chart for details).     Acute cystitis with hematuria-urine positive for large leuks, protein, large blood and cloudy.  Will send for culture.  Will go and treat for urinary tract infection in the meantime with antibiotics. Recommend take the antibiotics as prescribed and drink plenty of fluids Follow-up as needed Final Clinical  Impressions(s) / UC Diagnoses   Final diagnoses:  Acute cystitis with hematuria     Discharge Instructions      Treating you for a urinary tract infection.  Take the antibiotics as prescribed.  We will send for culture and call with any changes.  Make sure you are drinking plenty of fluids and follow-up as needed  ED Prescriptions     Medication Sig Dispense Auth. Provider   cephALEXin  (KEFLEX ) 500 MG capsule Take 1 capsule (500 mg total) by mouth 2 (two) times daily for 5 days. 10 capsule Landa Pine, FNP      PDMP not reviewed this encounter.   Landa Pine, FNP 10/05/23 1256

## 2023-10-05 NOTE — Discharge Instructions (Signed)
 Treating you for a urinary tract infection.  Take the antibiotics as prescribed.  We will send for culture and call with any changes.  Make sure you are drinking plenty of fluids and follow-up as needed

## 2023-10-05 NOTE — ED Triage Notes (Signed)
 Painful, frequent urination onset 3 days ago.

## 2023-10-07 LAB — URINE CULTURE: Culture: 50000 — AB

## 2023-10-08 ENCOUNTER — Ambulatory Visit (HOSPITAL_COMMUNITY): Payer: Self-pay

## 2023-10-09 ENCOUNTER — Ambulatory Visit
Admission: RE | Admit: 2023-10-09 | Discharge: 2023-10-09 | Disposition: A | Discharge status: Home / Self Care | Source: Ambulatory Visit | Attending: Radiation Oncology

## 2023-10-09 ENCOUNTER — Ambulatory Visit
Admission: RE | Admit: 2023-10-09 | Discharge: 2023-10-09 | Discharge status: Home / Self Care | Source: Ambulatory Visit | Attending: Radiation Oncology | Admitting: Radiation Oncology

## 2023-10-09 ENCOUNTER — Other Ambulatory Visit: Payer: Self-pay

## 2023-10-09 DIAGNOSIS — Z510 Encounter for antineoplastic radiation therapy: Secondary | ICD-10-CM | POA: Diagnosis not present

## 2023-10-09 DIAGNOSIS — Z170 Estrogen receptor positive status [ER+]: Secondary | ICD-10-CM | POA: Diagnosis not present

## 2023-10-09 DIAGNOSIS — C50411 Malignant neoplasm of upper-outer quadrant of right female breast: Secondary | ICD-10-CM | POA: Diagnosis not present

## 2023-10-09 DIAGNOSIS — Z87891 Personal history of nicotine dependence: Secondary | ICD-10-CM | POA: Diagnosis not present

## 2023-10-09 DIAGNOSIS — R300 Dysuria: Secondary | ICD-10-CM | POA: Diagnosis not present

## 2023-10-09 LAB — RAD ONC ARIA SESSION SUMMARY
Course Elapsed Days: 13
Plan Fractions Treated to Date: 9
Plan Prescribed Dose Per Fraction: 2.66 Gy
Plan Total Fractions Prescribed: 16
Plan Total Prescribed Dose: 42.56 Gy
Reference Point Dosage Given to Date: 23.94 Gy
Reference Point Session Dosage Given: 2.66 Gy
Session Number: 9

## 2023-10-10 ENCOUNTER — Other Ambulatory Visit: Payer: Self-pay

## 2023-10-10 ENCOUNTER — Ambulatory Visit
Admission: RE | Admit: 2023-10-10 | Discharge: 2023-10-10 | Disposition: A | Discharge status: Home / Self Care | Source: Ambulatory Visit | Attending: Radiation Oncology | Admitting: Radiation Oncology

## 2023-10-10 DIAGNOSIS — Z510 Encounter for antineoplastic radiation therapy: Secondary | ICD-10-CM | POA: Diagnosis not present

## 2023-10-10 DIAGNOSIS — Z170 Estrogen receptor positive status [ER+]: Secondary | ICD-10-CM | POA: Diagnosis not present

## 2023-10-10 DIAGNOSIS — R300 Dysuria: Secondary | ICD-10-CM | POA: Diagnosis not present

## 2023-10-10 DIAGNOSIS — Z87891 Personal history of nicotine dependence: Secondary | ICD-10-CM | POA: Diagnosis not present

## 2023-10-10 DIAGNOSIS — C50411 Malignant neoplasm of upper-outer quadrant of right female breast: Secondary | ICD-10-CM | POA: Diagnosis not present

## 2023-10-10 LAB — RAD ONC ARIA SESSION SUMMARY
Course Elapsed Days: 14
Plan Fractions Treated to Date: 10
Plan Prescribed Dose Per Fraction: 2.66 Gy
Plan Total Fractions Prescribed: 16
Plan Total Prescribed Dose: 42.56 Gy
Reference Point Dosage Given to Date: 26.6 Gy
Reference Point Session Dosage Given: 2.66 Gy
Session Number: 10

## 2023-10-11 ENCOUNTER — Other Ambulatory Visit: Payer: Self-pay

## 2023-10-11 ENCOUNTER — Ambulatory Visit
Admission: RE | Admit: 2023-10-11 | Discharge: 2023-10-11 | Disposition: A | Discharge status: Home / Self Care | Source: Ambulatory Visit | Attending: Radiation Oncology

## 2023-10-11 DIAGNOSIS — C50411 Malignant neoplasm of upper-outer quadrant of right female breast: Secondary | ICD-10-CM | POA: Diagnosis not present

## 2023-10-11 DIAGNOSIS — Z510 Encounter for antineoplastic radiation therapy: Secondary | ICD-10-CM | POA: Diagnosis not present

## 2023-10-11 DIAGNOSIS — Z170 Estrogen receptor positive status [ER+]: Secondary | ICD-10-CM | POA: Diagnosis not present

## 2023-10-11 DIAGNOSIS — Z87891 Personal history of nicotine dependence: Secondary | ICD-10-CM | POA: Diagnosis not present

## 2023-10-11 DIAGNOSIS — R300 Dysuria: Secondary | ICD-10-CM | POA: Diagnosis not present

## 2023-10-11 LAB — RAD ONC ARIA SESSION SUMMARY
Course Elapsed Days: 15
Plan Fractions Treated to Date: 11
Plan Prescribed Dose Per Fraction: 2.66 Gy
Plan Total Fractions Prescribed: 16
Plan Total Prescribed Dose: 42.56 Gy
Reference Point Dosage Given to Date: 29.26 Gy
Reference Point Session Dosage Given: 2.66 Gy
Session Number: 11

## 2023-10-12 ENCOUNTER — Ambulatory Visit
Admission: RE | Admit: 2023-10-12 | Discharge: 2023-10-12 | Disposition: A | Discharge status: Home / Self Care | Source: Ambulatory Visit | Attending: Radiation Oncology | Admitting: Radiation Oncology

## 2023-10-12 ENCOUNTER — Other Ambulatory Visit: Payer: Self-pay

## 2023-10-12 DIAGNOSIS — Z87891 Personal history of nicotine dependence: Secondary | ICD-10-CM | POA: Diagnosis not present

## 2023-10-12 DIAGNOSIS — Z510 Encounter for antineoplastic radiation therapy: Secondary | ICD-10-CM | POA: Diagnosis not present

## 2023-10-12 DIAGNOSIS — R300 Dysuria: Secondary | ICD-10-CM | POA: Diagnosis not present

## 2023-10-12 DIAGNOSIS — C50411 Malignant neoplasm of upper-outer quadrant of right female breast: Secondary | ICD-10-CM | POA: Diagnosis not present

## 2023-10-12 DIAGNOSIS — Z170 Estrogen receptor positive status [ER+]: Secondary | ICD-10-CM | POA: Diagnosis not present

## 2023-10-12 LAB — RAD ONC ARIA SESSION SUMMARY
Course Elapsed Days: 16
Plan Fractions Treated to Date: 12
Plan Prescribed Dose Per Fraction: 2.66 Gy
Plan Total Fractions Prescribed: 16
Plan Total Prescribed Dose: 42.56 Gy
Reference Point Dosage Given to Date: 31.92 Gy
Reference Point Session Dosage Given: 2.66 Gy
Session Number: 12

## 2023-10-15 ENCOUNTER — Other Ambulatory Visit: Payer: Self-pay

## 2023-10-15 ENCOUNTER — Ambulatory Visit
Admission: RE | Admit: 2023-10-15 | Discharge: 2023-10-15 | Disposition: A | Discharge status: Home / Self Care | Source: Ambulatory Visit | Attending: Radiation Oncology | Admitting: Radiation Oncology

## 2023-10-15 DIAGNOSIS — Z170 Estrogen receptor positive status [ER+]: Secondary | ICD-10-CM | POA: Insufficient documentation

## 2023-10-15 DIAGNOSIS — Z510 Encounter for antineoplastic radiation therapy: Secondary | ICD-10-CM | POA: Diagnosis not present

## 2023-10-15 DIAGNOSIS — C50411 Malignant neoplasm of upper-outer quadrant of right female breast: Secondary | ICD-10-CM | POA: Insufficient documentation

## 2023-10-15 DIAGNOSIS — R300 Dysuria: Secondary | ICD-10-CM | POA: Diagnosis not present

## 2023-10-15 DIAGNOSIS — Z87891 Personal history of nicotine dependence: Secondary | ICD-10-CM | POA: Diagnosis not present

## 2023-10-15 LAB — RAD ONC ARIA SESSION SUMMARY
Course Elapsed Days: 19
Plan Fractions Treated to Date: 13
Plan Prescribed Dose Per Fraction: 2.66 Gy
Plan Total Fractions Prescribed: 16
Plan Total Prescribed Dose: 42.56 Gy
Reference Point Dosage Given to Date: 34.58 Gy
Reference Point Session Dosage Given: 2.66 Gy
Session Number: 13

## 2023-10-16 ENCOUNTER — Other Ambulatory Visit: Payer: Self-pay

## 2023-10-16 ENCOUNTER — Ambulatory Visit
Admission: RE | Admit: 2023-10-16 | Discharge: 2023-10-16 | Disposition: A | Discharge status: Home / Self Care | Source: Ambulatory Visit | Attending: Radiation Oncology

## 2023-10-16 ENCOUNTER — Ambulatory Visit
Admission: RE | Admit: 2023-10-16 | Discharge: 2023-10-16 | Disposition: A | Discharge status: Home / Self Care | Source: Ambulatory Visit | Attending: Radiation Oncology | Admitting: Radiation Oncology

## 2023-10-16 DIAGNOSIS — Z87891 Personal history of nicotine dependence: Secondary | ICD-10-CM | POA: Diagnosis not present

## 2023-10-16 DIAGNOSIS — Z170 Estrogen receptor positive status [ER+]: Secondary | ICD-10-CM | POA: Diagnosis not present

## 2023-10-16 DIAGNOSIS — Z510 Encounter for antineoplastic radiation therapy: Secondary | ICD-10-CM | POA: Diagnosis not present

## 2023-10-16 DIAGNOSIS — C50411 Malignant neoplasm of upper-outer quadrant of right female breast: Secondary | ICD-10-CM | POA: Diagnosis not present

## 2023-10-16 DIAGNOSIS — R300 Dysuria: Secondary | ICD-10-CM | POA: Diagnosis not present

## 2023-10-16 LAB — RAD ONC ARIA SESSION SUMMARY
Course Elapsed Days: 20
Plan Fractions Treated to Date: 14
Plan Prescribed Dose Per Fraction: 2.66 Gy
Plan Total Fractions Prescribed: 16
Plan Total Prescribed Dose: 42.56 Gy
Reference Point Dosage Given to Date: 37.24 Gy
Reference Point Session Dosage Given: 2.66 Gy
Session Number: 14

## 2023-10-17 ENCOUNTER — Ambulatory Visit
Admission: RE | Admit: 2023-10-17 | Discharge: 2023-10-17 | Disposition: A | Discharge status: Home / Self Care | Source: Ambulatory Visit | Attending: Radiation Oncology | Admitting: Radiation Oncology

## 2023-10-17 ENCOUNTER — Other Ambulatory Visit: Payer: Self-pay

## 2023-10-17 DIAGNOSIS — Z510 Encounter for antineoplastic radiation therapy: Secondary | ICD-10-CM | POA: Diagnosis not present

## 2023-10-17 DIAGNOSIS — C50411 Malignant neoplasm of upper-outer quadrant of right female breast: Secondary | ICD-10-CM | POA: Diagnosis not present

## 2023-10-17 DIAGNOSIS — R300 Dysuria: Secondary | ICD-10-CM | POA: Diagnosis not present

## 2023-10-17 DIAGNOSIS — Z170 Estrogen receptor positive status [ER+]: Secondary | ICD-10-CM | POA: Diagnosis not present

## 2023-10-17 DIAGNOSIS — Z87891 Personal history of nicotine dependence: Secondary | ICD-10-CM | POA: Diagnosis not present

## 2023-10-17 LAB — RAD ONC ARIA SESSION SUMMARY
Course Elapsed Days: 21
Plan Fractions Treated to Date: 15
Plan Prescribed Dose Per Fraction: 2.66 Gy
Plan Total Fractions Prescribed: 16
Plan Total Prescribed Dose: 42.56 Gy
Reference Point Dosage Given to Date: 39.9 Gy
Reference Point Session Dosage Given: 2.66 Gy
Session Number: 15

## 2023-10-18 ENCOUNTER — Ambulatory Visit
Admission: RE | Admit: 2023-10-18 | Discharge: 2023-10-18 | Disposition: A | Discharge status: Home / Self Care | Source: Ambulatory Visit | Attending: Radiation Oncology

## 2023-10-18 ENCOUNTER — Other Ambulatory Visit: Payer: Self-pay

## 2023-10-18 DIAGNOSIS — R300 Dysuria: Secondary | ICD-10-CM | POA: Diagnosis not present

## 2023-10-18 DIAGNOSIS — Z170 Estrogen receptor positive status [ER+]: Secondary | ICD-10-CM | POA: Diagnosis not present

## 2023-10-18 DIAGNOSIS — C50411 Malignant neoplasm of upper-outer quadrant of right female breast: Secondary | ICD-10-CM | POA: Diagnosis not present

## 2023-10-18 DIAGNOSIS — Z510 Encounter for antineoplastic radiation therapy: Secondary | ICD-10-CM | POA: Diagnosis not present

## 2023-10-18 DIAGNOSIS — Z87891 Personal history of nicotine dependence: Secondary | ICD-10-CM | POA: Diagnosis not present

## 2023-10-18 LAB — RAD ONC ARIA SESSION SUMMARY
Course Elapsed Days: 22
Plan Fractions Treated to Date: 16
Plan Prescribed Dose Per Fraction: 2.66 Gy
Plan Total Fractions Prescribed: 16
Plan Total Prescribed Dose: 42.56 Gy
Reference Point Dosage Given to Date: 42.56 Gy
Reference Point Session Dosage Given: 2.66 Gy
Session Number: 16

## 2023-10-19 ENCOUNTER — Other Ambulatory Visit: Payer: Self-pay

## 2023-10-19 ENCOUNTER — Ambulatory Visit: Admitting: Radiation Oncology

## 2023-10-19 ENCOUNTER — Ambulatory Visit
Admission: RE | Admit: 2023-10-19 | Discharge: 2023-10-19 | Disposition: A | Discharge status: Home / Self Care | Source: Ambulatory Visit | Attending: Radiation Oncology | Admitting: Radiation Oncology

## 2023-10-19 DIAGNOSIS — Z170 Estrogen receptor positive status [ER+]: Secondary | ICD-10-CM | POA: Diagnosis not present

## 2023-10-19 DIAGNOSIS — R300 Dysuria: Secondary | ICD-10-CM | POA: Diagnosis not present

## 2023-10-19 DIAGNOSIS — Z87891 Personal history of nicotine dependence: Secondary | ICD-10-CM | POA: Diagnosis not present

## 2023-10-19 DIAGNOSIS — Z510 Encounter for antineoplastic radiation therapy: Secondary | ICD-10-CM | POA: Diagnosis not present

## 2023-10-19 DIAGNOSIS — C50411 Malignant neoplasm of upper-outer quadrant of right female breast: Secondary | ICD-10-CM | POA: Diagnosis not present

## 2023-10-19 LAB — RAD ONC ARIA SESSION SUMMARY
Course Elapsed Days: 23
Plan Fractions Treated to Date: 1
Plan Prescribed Dose Per Fraction: 2 Gy
Plan Total Fractions Prescribed: 5
Plan Total Prescribed Dose: 10 Gy
Reference Point Dosage Given to Date: 2 Gy
Reference Point Session Dosage Given: 2 Gy
Session Number: 17

## 2023-10-22 ENCOUNTER — Ambulatory Visit
Admission: RE | Admit: 2023-10-22 | Discharge: 2023-10-22 | Disposition: A | Discharge status: Home / Self Care | Source: Ambulatory Visit | Attending: Radiation Oncology

## 2023-10-22 ENCOUNTER — Other Ambulatory Visit: Payer: Self-pay

## 2023-10-22 DIAGNOSIS — Z170 Estrogen receptor positive status [ER+]: Secondary | ICD-10-CM | POA: Diagnosis not present

## 2023-10-22 DIAGNOSIS — R300 Dysuria: Secondary | ICD-10-CM | POA: Diagnosis not present

## 2023-10-22 DIAGNOSIS — Z87891 Personal history of nicotine dependence: Secondary | ICD-10-CM | POA: Diagnosis not present

## 2023-10-22 DIAGNOSIS — Z510 Encounter for antineoplastic radiation therapy: Secondary | ICD-10-CM | POA: Diagnosis not present

## 2023-10-22 DIAGNOSIS — C50411 Malignant neoplasm of upper-outer quadrant of right female breast: Secondary | ICD-10-CM | POA: Diagnosis not present

## 2023-10-22 LAB — RAD ONC ARIA SESSION SUMMARY
Course Elapsed Days: 26
Plan Fractions Treated to Date: 2
Plan Prescribed Dose Per Fraction: 2 Gy
Plan Total Fractions Prescribed: 5
Plan Total Prescribed Dose: 10 Gy
Reference Point Dosage Given to Date: 4 Gy
Reference Point Session Dosage Given: 2 Gy
Session Number: 18

## 2023-10-23 ENCOUNTER — Ambulatory Visit: Admitting: Radiation Oncology

## 2023-10-23 ENCOUNTER — Ambulatory Visit
Admission: RE | Admit: 2023-10-23 | Discharge: 2023-10-23 | Disposition: A | Discharge status: Home / Self Care | Source: Ambulatory Visit | Attending: Radiation Oncology

## 2023-10-23 ENCOUNTER — Other Ambulatory Visit: Payer: Self-pay

## 2023-10-23 ENCOUNTER — Ambulatory Visit
Admission: RE | Admit: 2023-10-23 | Discharge: 2023-10-23 | Disposition: A | Discharge status: Home / Self Care | Source: Ambulatory Visit | Attending: Radiation Oncology | Admitting: Radiation Oncology

## 2023-10-23 DIAGNOSIS — C50411 Malignant neoplasm of upper-outer quadrant of right female breast: Secondary | ICD-10-CM | POA: Diagnosis not present

## 2023-10-23 DIAGNOSIS — R300 Dysuria: Secondary | ICD-10-CM | POA: Diagnosis not present

## 2023-10-23 DIAGNOSIS — Z510 Encounter for antineoplastic radiation therapy: Secondary | ICD-10-CM | POA: Diagnosis not present

## 2023-10-23 DIAGNOSIS — Z87891 Personal history of nicotine dependence: Secondary | ICD-10-CM | POA: Diagnosis not present

## 2023-10-23 DIAGNOSIS — Z170 Estrogen receptor positive status [ER+]: Secondary | ICD-10-CM | POA: Diagnosis not present

## 2023-10-23 LAB — RAD ONC ARIA SESSION SUMMARY
Course Elapsed Days: 27
Plan Fractions Treated to Date: 3
Plan Prescribed Dose Per Fraction: 2 Gy
Plan Total Fractions Prescribed: 5
Plan Total Prescribed Dose: 10 Gy
Reference Point Dosage Given to Date: 6 Gy
Reference Point Session Dosage Given: 2 Gy
Session Number: 19

## 2023-10-24 ENCOUNTER — Ambulatory Visit
Admission: RE | Admit: 2023-10-24 | Discharge: 2023-10-24 | Disposition: A | Discharge status: Home / Self Care | Source: Ambulatory Visit | Attending: Radiation Oncology | Admitting: Radiation Oncology

## 2023-10-24 ENCOUNTER — Other Ambulatory Visit: Payer: Self-pay

## 2023-10-24 DIAGNOSIS — Z87891 Personal history of nicotine dependence: Secondary | ICD-10-CM | POA: Diagnosis not present

## 2023-10-24 DIAGNOSIS — Z510 Encounter for antineoplastic radiation therapy: Secondary | ICD-10-CM | POA: Diagnosis not present

## 2023-10-24 DIAGNOSIS — Z170 Estrogen receptor positive status [ER+]: Secondary | ICD-10-CM | POA: Diagnosis not present

## 2023-10-24 DIAGNOSIS — C50411 Malignant neoplasm of upper-outer quadrant of right female breast: Secondary | ICD-10-CM | POA: Diagnosis not present

## 2023-10-24 DIAGNOSIS — R300 Dysuria: Secondary | ICD-10-CM | POA: Diagnosis not present

## 2023-10-24 LAB — RAD ONC ARIA SESSION SUMMARY
Course Elapsed Days: 28
Plan Fractions Treated to Date: 4
Plan Prescribed Dose Per Fraction: 2 Gy
Plan Total Fractions Prescribed: 5
Plan Total Prescribed Dose: 10 Gy
Reference Point Dosage Given to Date: 8 Gy
Reference Point Session Dosage Given: 2 Gy
Session Number: 20

## 2023-10-25 ENCOUNTER — Other Ambulatory Visit: Payer: Self-pay

## 2023-10-25 ENCOUNTER — Ambulatory Visit
Admission: RE | Admit: 2023-10-25 | Discharge: 2023-10-25 | Disposition: A | Discharge status: Home / Self Care | Source: Ambulatory Visit | Attending: Radiation Oncology | Admitting: Radiation Oncology

## 2023-10-25 DIAGNOSIS — Z510 Encounter for antineoplastic radiation therapy: Secondary | ICD-10-CM | POA: Diagnosis not present

## 2023-10-25 DIAGNOSIS — Z87891 Personal history of nicotine dependence: Secondary | ICD-10-CM | POA: Diagnosis not present

## 2023-10-25 DIAGNOSIS — C50411 Malignant neoplasm of upper-outer quadrant of right female breast: Secondary | ICD-10-CM | POA: Diagnosis not present

## 2023-10-25 DIAGNOSIS — R300 Dysuria: Secondary | ICD-10-CM | POA: Diagnosis not present

## 2023-10-25 DIAGNOSIS — Z170 Estrogen receptor positive status [ER+]: Secondary | ICD-10-CM | POA: Diagnosis not present

## 2023-10-25 LAB — RAD ONC ARIA SESSION SUMMARY
Course Elapsed Days: 29
Plan Fractions Treated to Date: 5
Plan Prescribed Dose Per Fraction: 2 Gy
Plan Total Fractions Prescribed: 5
Plan Total Prescribed Dose: 10 Gy
Reference Point Dosage Given to Date: 10 Gy
Reference Point Session Dosage Given: 2 Gy
Session Number: 21

## 2023-10-26 NOTE — Radiation Completion Notes (Signed)
 Patient Name: Candice Perez, WAFER MRN: [id number] Date of Birth: 07-May-1951 Referring Physician: Suzette Espy, M.D. Date of Service: 2023-10-26 Radiation Oncologist: Verlie Glisson, M.D. [facility] [place]                             RADIATION ONCOLOGY END OF TREATMENT NOTE     Diagnosis: C50.411 Malignant neoplasm of upper-outer quadrant of right female breast Staging on 2023-07-25: Malignant neoplasm of upper-outer quadrant of right breast in female, estrogen receptor positive (HCC) T=cT1b, N=cN0, M=cM0 Intent: Curative     ==========DELIVERED PLANS==========  First Treatment Date: 2023-09-26 Last Treatment Date: 2023-10-25   Plan Name: Breast_R Site: Breast, Right Technique: 3D Mode: Photon Dose Per Fraction: 2.66 Gy Prescribed Dose (Delivered / Prescribed): 42.56 Gy / 42.56 Gy Prescribed Fxs (Delivered / Prescribed): 16 / 16   Plan Name: Breast_R_Bst Site: Breast, Right Technique: 3D Mode: Photon Dose Per Fraction: 2 Gy Prescribed Dose (Delivered / Prescribed): 10 Gy / 10 Gy Prescribed Fxs (Delivered / Prescribed): 5 / 5     ==========ON TREATMENT VISIT DATES========== 2023-10-02, 2023-10-09, 2023-10-16, 2023-10-23     ==========UPCOMING VISITS========== 12/18/2023 [facility] ON[facility] TELEPHONE OFFICE VISIT Cameron Cea, MD  11/21/2023 [facility] ONC FOLLOW UP 20 Verlie Glisson, MD  11/06/2023 [facility] ONC WEEKLY [facility] Jacqulyn Maxcy, MD        ==========APPENDIX - ON TREATMENT VISIT NOTES==========   See weekly On Treatment Notes in Epic for details in the Media tab (listed as Progress notes on the On Treatment Visit Dates listed above).

## 2023-10-30 ENCOUNTER — Ambulatory Visit: Admitting: Radiation Oncology

## 2023-11-06 ENCOUNTER — Ambulatory Visit: Admitting: Radiation Oncology

## 2023-11-10 ENCOUNTER — Encounter (HOSPITAL_COMMUNITY): Payer: Self-pay | Admitting: Interventional Radiology

## 2023-11-15 DIAGNOSIS — R300 Dysuria: Secondary | ICD-10-CM | POA: Diagnosis not present

## 2023-11-21 ENCOUNTER — Ambulatory Visit
Admission: RE | Admit: 2023-11-21 | Discharge: 2023-11-21 | Disposition: A | Discharge status: Home / Self Care | Payer: Self-pay | Source: Ambulatory Visit | Attending: Radiation Oncology | Admitting: Radiation Oncology

## 2023-11-21 VITALS — BP 108/81 | HR 75 | Temp 98.4°F | Resp 20 | Ht 67.0 in | Wt 286.4 lb

## 2023-11-21 DIAGNOSIS — C50411 Malignant neoplasm of upper-outer quadrant of right female breast: Secondary | ICD-10-CM

## 2023-11-21 NOTE — Progress Notes (Signed)
 Department of Radiation Oncology    Followup Note    Name: Candice Perez Date: 11/21/2023 MRN: [id number] DOB: [birth year]   Diagnosis:     ICD-10-CM   1. Malignant neoplasm of upper-outer quadrant of right female breast, unspecified estrogen receptor status (HCC)  C50.411         MEDICATIONS: Current Outpatient Medications  Medication Sig Dispense Refill   ciprofloxacin  (CIPRO ) 500 MG tablet Take 500 mg by mouth 2 (two) times daily.     Dermatological Products, Misc. (STRATA XRT) GEL Apply topically daily.     hydrALAZINE  (APRESOLINE ) 25 MG tablet Take 25 mg by mouth 2 (two) times daily.     Potassium Chloride  ER 20 MEQ TBCR Take 1 tablet by mouth daily.     acetaminophen  (TYLENOL ) 650 MG CR tablet Take 1,300 mg by mouth in the morning and at bedtime. In the morning & with lunch     allopurinol  (ZYLOPRIM ) 100 MG tablet Take 1 tablet (100 mg total) by mouth daily. 30 tablet 3   anastrozole  (ARIMIDEX ) 1 MG tablet Take 1 tablet (1 mg total) by mouth daily. 90 tablet 3   apixaban  (ELIQUIS ) 5 MG TABS tablet Take 1 tablet (5 mg total) by mouth 2 (two) times daily. 60 tablet 0   Ascorbic Acid (VITAMIN C) 500 MG CAPS Take 500 mg by mouth in the morning.     carvedilol  (COREG ) 12.5 MG tablet Take 1 tablet (12.5 mg total) by mouth 2 (two) times daily with a meal. (Patient taking differently: Take 12.5 mg by mouth in the morning.) 180 tablet 1   carvedilol  (COREG ) 6.25 MG tablet Take 6.25 mg by mouth every evening.     Cholecalciferol (VITAMIN D3) 1000 units CAPS Take 1,000 Units by mouth in the morning.     cyanocobalamin (VITAMIN B12) 1000 MCG tablet Take 1,000 mcg by mouth in the morning.     digoxin  (LANOXIN ) 0.125 MG tablet Take 0.5 tablets (0.0625 mg total) by mouth daily. 90 tablet 3   DILT-XR 240 MG 24 hr capsule Take 240 mg by mouth every morning.     diltiazem  (CARDIZEM  CD) 120 MG 24 hr capsule Take 1 capsule (120 mg total) by mouth daily. (Patient taking differently: Take 120  mg by mouth at bedtime.) 90 capsule 1   gabapentin  (NEURONTIN ) 400 MG capsule Take 1 capsule (400 mg total) by mouth at bedtime. 90 capsule 3   KLOR-CON  M20 20 MEQ tablet Take 20 mEq by mouth in the morning.     levothyroxine  (SYNTHROID ) 125 MCG tablet Take 1 tablet (125 mcg total) by mouth daily at 6 (six) AM. 30 tablet 3   Magnesium  Oxide -Mg Supplement 250 MG TABS Take 250 mg by mouth in the morning.     Multiple Vitamins-Minerals (MULTIVITAMIN WOMEN) TABS Take 1 tablet by mouth in the morning.     oxyCODONE  (ROXICODONE ) 5 MG immediate release tablet Take 1 tablet (5 mg total) by mouth every 6 (six) hours as needed for severe pain (pain score 7-10). 10 tablet 0   rosuvastatin  (CRESTOR ) 20 MG tablet Take 1 tablet (20 mg total) by mouth daily. 30 tablet 0   TART CHERRY PO Take 1 tablet by mouth 3 (three) times daily.     torsemide  (DEMADEX ) 20 MG tablet Take 20 mg by mouth in the morning and at bedtime.     Zinc 50 MG TABS Take 50 mg by mouth in the morning.     No current facility-administered  medications for this encounter.     ALLERGIES: Patient has no known allergies.   NARRATIVE: Candice Perez was seen today in followup for her invasive carcinoma of the right breast.  She completed adjuvant breast radiation approximately 4 weeks ago.  This is her first follow-up visit.  She is taking an aromatase inhibitor, and states that she is tolerating it well.  She reports no pain in either breast.  Her energy level is reasonably good.   PHYSICAL EXAMINATION: height is 5' 7 (1.702 m) and weight is 286 lb 6.4 oz (129.9 kg). Her oral temperature is 98.4 F (36.9 C). Her blood pressure is 108/81 and her pulse is 75. Her respiration is 20 and oxygen saturation is 94%.      She is in no apparent distress.  Examination of her right breast reveals resolving mild residual hyperpigmentation.  ASSESSMENT: The patient is doing well approximately 1 month out from completion of adjuvant breast radiation.   Her treatment related side effects have largely resolved.  She has upcoming follow-up scheduled with Dr. Gudena.  As she will continue to follow-up routinely with Dr. Gudena, further follow-up in our department will be on a as needed basis, though I encouraged her to contact me at anytime with any questions or concerns she may have.  PLAN: The patient will follow up on an as needed basis.     Auria Mckinlay A. Jomarie, MD

## 2023-12-18 ENCOUNTER — Telehealth: Admitting: Hematology and Oncology

## 2023-12-26 ENCOUNTER — Inpatient Hospital Stay: Attending: Adult Health | Admitting: Adult Health

## 2023-12-26 DIAGNOSIS — Z79811 Long term (current) use of aromatase inhibitors: Secondary | ICD-10-CM

## 2023-12-26 DIAGNOSIS — Z13820 Encounter for screening for osteoporosis: Secondary | ICD-10-CM

## 2023-12-26 DIAGNOSIS — Z170 Estrogen receptor positive status [ER+]: Secondary | ICD-10-CM

## 2023-12-26 DIAGNOSIS — C50411 Malignant neoplasm of upper-outer quadrant of right female breast: Secondary | ICD-10-CM

## 2023-12-26 NOTE — Progress Notes (Signed)
 [facility] Cancer Follow up:    Candice Marcellus RAMAN, MD [address] [place] [address]   DIAGNOSIS:  Cancer Staging  Malignant neoplasm of upper-outer quadrant of right breast in female, estrogen receptor positive (HCC) Staging form: Breast, AJCC 8th Edition - Clinical: Stage IA (cT1b, cN0, cM0, G2, ER+, PR+, HER2-) - Signed by Odean Potts, MD on 07/25/2023 Histologic grading system: 3 grade system  I connected with Candice Perez on 12/27/23 at  9:20 AM EDT by telephone and verified that I am speaking with the correct person using two identifiers.  I discussed the limitations, risks, security and privacy concerns of performing an evaluation and management service by telephone and the availability of in person appointments.  I also discussed with the patient that there may be a patient responsible charge related to this service. The patient expressed understanding and agreed to proceed.  Patient location: home Provider location: [facility] office  SUMMARY OF ONCOLOGIC HISTORY: Oncology History  Malignant neoplasm of upper-outer quadrant of right breast in female, estrogen receptor positive (HCC)  07/10/2023 Initial Diagnosis   Screening mammogram detected right breast mass 7 mm retroareolar, axilla negative, biopsy: Grade 2 IDC with apocrine features, LVI not identified, ER 100%, PR 100%, Ki67 10%, HER2 1+ IHC negative   07/25/2023 Cancer Staging   Staging form: Breast, AJCC 8th Edition - Clinical: Stage IA (cT1b, cN0, cM0, G2, ER+, PR+, HER2-) - Signed by Odean Potts, MD on 07/25/2023 Histologic grading system: 3 grade system   08/29/2023 Surgery   Right lumpectomy: Grade 2 IDC 0.8 cm, margins negative, ER 100%, PR 100%, HER2 1+ negative, Ki-67 10%    09/26/2023 - 10/25/2023 Radiation Therapy   Plan Name: Breast_R Site: Breast, Right Technique: 3D Mode: Photon Dose Per Fraction: 2.66 Gy Prescribed Dose (Delivered / Prescribed): 42.56 Gy / 42.56 Gy Prescribed Fxs  (Delivered / Prescribed): 16 / 16   Plan Name: Breast_R_Bst Site: Breast, Right Technique: 3D Mode: Photon Dose Per Fraction: 2 Gy Prescribed Dose (Delivered / Prescribed): 10 Gy / 10 Gy Prescribed Fxs (Delivered / Prescribed): 5 / 5   09/2023 -  Anti-estrogen oral therapy   Anastrozole  daily     CURRENT THERAPY: Anastrozole   INTERVAL HISTORY: Discussed the use of AI scribe software for clinical note transcription with the patient, who gave verbal consent to proceed.  History of Present Illness Candice Perez is a 73 year old female with breast cancer who presents for follow-up after radiation therapy.  She completed radiation therapy with the last week being challenging due to skin peeling, which has since healed. There is no current breast swelling.  She started anastrozole  in May and takes it daily. She tolerates it well with minimal side effects, including occasional diarrhea and mild dermatitis at night. She does not experience significant joint aches, pains, or hot flashes.  Her last mammogram was in February at [facility], and she prefers future mammograms at [facility] if possible. She had a bone density test in 2021 that was normal.       Patient Active Problem List   Diagnosis Date Noted   Malignant neoplasm of upper-outer quadrant of right breast in female, estrogen receptor positive (HCC) 07/25/2023   CHF (congestive heart failure) (HCC) 11/15/2022   Symptomatic bradycardia 08/17/2021   Dyspnea 08/17/2021   AKI (acute kidney injury) (HCC) 08/17/2021   Paroxysmal A-fib (HCC) 08/17/2021   History of stroke 08/17/2021   HLD (hyperlipidemia) 08/17/2021   Hypothyroidism 08/17/2021  Chronic systolic CHF (congestive heart failure) (HCC) 08/17/2021   Cerebrovascular accident (CVA) due to embolism of left middle cerebral artery (HCC) 03/29/2021   Right leg pain 03/29/2021   Snoring 03/29/2021   Right hemiparesis (HCC)    Essential hypertension     Sinus tachycardia    Acute lower UTI    Left middle cerebral artery stroke (HCC) 12/09/2020   Right hemiplegia (HCC) 12/09/2020   Acute respiratory failure with hypoxia (HCC)    Cerebrovascular accident (CVA) (HCC) 12/02/2020   Middle cerebral artery embolism, left 12/02/2020   TIA (transient ischemic attack) 06/15/2017   Morbid obesity (HCC) 08/27/2013   Expected blood loss anemia 08/27/2013   S/P left TKA 08/26/2013   Deep vein thrombosis of left lower extremity (HCC) 01/03/2013   Varicose veins of bilateral lower extremities with other complications 05/01/2011   Chronic venous insufficiency 12/09/2010    has no known allergies.  MEDICAL HISTORY: Past Medical History:  Diagnosis Date   A-fib [facility])    Arthritis    Breast cancer (HCC) 07/2023   DVT (deep venous thrombosis) (HCC)    Dyspnea    Dysrhythmia    a-fib   Edema    GERD (gastroesophageal reflux disease)    OCCASIONAL - PRILOSEC IF NEEDED   Hyperlipidemia    Hypertension    no meds now   Hypothyroidism    Stroke (HCC) 2022   r side weakness (hand)   Varicose veins     SURGICAL HISTORY: Past Surgical History:  Procedure Laterality Date   APPENDECTOMY  1978   BREAST BIOPSY  08/28/2023   MM RT RADIOACTIVE SEED LOC MAMMO GUIDE 08/28/2023 [facility] MAMMOGRAPHY   BREAST LUMPECTOMY WITH RADIOACTIVE SEED LOCALIZATION Right 08/29/2023   Procedure: BREAST LUMPECTOMY WITH RADIOACTIVE SEED LOCALIZATION;  Surgeon: Curvin Deward MOULD, MD;  Location: [facility] OR;  Service: General;  Laterality: Right;  RIGHT BREAST RADIOACTIVE SEED LOCALIZED LUMPECTOMY   CHOLECYSTECTOMY     Laparoscopic   CYSTOCELE REPAIR     HERNIA REPAIR     IR CT HEAD LTD  12/02/2020   IR PERCUTANEOUS ART THROMBECTOMY/INFUSION INTRACRANIAL INC DIAG ANGIO  12/02/2020   RADIOLOGY WITH ANESTHESIA N/A 12/02/2020   Procedure: IR WITH ANESTHESIA;  Surgeon: Dolphus Carrion, MD;  Location: [facility] OR;  Service: Radiology;  Laterality: N/A;   RIGHT/LEFT HEART CATH AND  CORONARY ANGIOGRAPHY N/A 11/20/2022   Procedure: RIGHT/LEFT HEART CATH AND CORONARY ANGIOGRAPHY;  Surgeon: Claudene Pacific, MD;  Location: [facility] INVASIVE CV LAB;  Service: Cardiovascular;  Laterality: N/A;   TONSILLECTOMY     TOTAL KNEE ARTHROPLASTY Left 08/26/2013   Procedure: LEFT TOTAL KNEE REVISION;  Surgeon: Donnice JONETTA Car, MD;  Location: [facility] ORS;  Service: Orthopedics;  Laterality: Left;   VARICOSE VEIN SURGERY      SOCIAL HISTORY: Social History   Socioeconomic History   Marital status: Married    Spouse name: Not on file   Number of children: 2   Years of education: 9   Highest education level: Not on file  Occupational History   Occupation: Retired  Tobacco Use   Smoking status: Former    Current packs/day: 0.00    Types: Cigarettes    Start date: 09/17/1970    Quit date: 09/17/2010    Years since quitting: 13.2   Smokeless tobacco: Never  Vaping Use   Vaping status: Never Used  Substance and Sexual Activity   Alcohol use: No   Drug use: No   Sexual activity: Not on file  Other Topics Concern   Not on file  Social History Narrative   Lives at home with her husband.   Right-handed.   No daily caffeine use.   Social Drivers of Corporate investment banker Strain: Not on file  Food Insecurity: No Food Insecurity (11/15/2022)   Hunger Vital Sign    Worried About Running Out of Food in the Last Year: Never true    Ran Out of Food in the Last Year: Never true  Transportation Needs: No Transportation Needs (11/15/2022)   PRAPARE - Administrator, Civil Service (Medical): No    Lack of Transportation (Non-Medical): No  Physical Activity: Not on file  Stress: Not on file  Social Connections: Not on file  Intimate Partner Violence: Not At Risk (11/15/2022)   Humiliation, Afraid, Rape, and Kick questionnaire    Fear of Current or Ex-Partner: No    Emotionally Abused: No    Physically Abused: No    Sexually Abused: No    FAMILY HISTORY: Family History  Problem  Relation Age of Onset   Congestive Heart Failure Mother    Cancer Mother        tongue/sinus   Cancer Father        in his back   Other Father        Varicose veins, Arteriosclerosis   Breast cancer Sister     Review of Systems  Constitutional:  Negative for appetite change, chills, fatigue, fever and unexpected weight change.  HENT:   Negative for hearing loss, lump/mass and trouble swallowing.   Eyes:  Negative for eye problems and icterus.  Respiratory:  Negative for chest tightness, cough and shortness of breath.   Cardiovascular:  Negative for chest pain, leg swelling and palpitations.  Gastrointestinal:  Negative for abdominal distention, abdominal pain, constipation, diarrhea, nausea and vomiting.  Endocrine: Positive for hot flashes (occasional at night).  Genitourinary:  Negative for difficulty urinating.   Musculoskeletal:  Negative for arthralgias.  Skin:  Negative for itching and rash.  Neurological:  Negative for dizziness, extremity weakness, headaches and numbness.  Hematological:  Negative for adenopathy. Does not bruise/bleed easily.  Psychiatric/Behavioral:  Negative for depression. The patient is not nervous/anxious.     PHYSICAL EXAMINATION  Patient sounds well, in no apparent distress.  Mood and behavior are normal.  Breathing is non labored.    ASSESSMENT and THERAPY PLAN:   Malignant neoplasm of upper-outer quadrant of right breast in female, estrogen receptor positive (HCC) 07/10/2023: [facility]:Screening mammogram detected right breast mass 7 mm retroareolar, axilla negative, biopsy: Grade 2 IDC with apocrine features, LVI not identified, ER 100%, PR 100%, Ki67 10%, HER2 1+ IHC negative Prior history of stroke  08/29/2023: Right lumpectomy: Grade 2 IDC 0.8 cm, margins negative, ER 100%, PR 100%, HER2 1+ negative, Ki-67 10%  Pathology counseling: I discussed the final pathology report of the patient provided  a copy of this report. I discussed the  margins as well as lymph node surgeries. We also discussed the final staging along with previously performed ER/PR and HER-2/neu testing.  Treatment plan: +/- Adjuvant radiation therapy followed by Adjuvant antiestrogen therapy  Assessment and Plan Assessment & Plan Breast cancer, status post radiation, on anastrozole  Breast cancer treated with radiation. Skin breakdown resolved. No breast swelling. Anastrozole  started May 2025, well-tolerated with minimal side effects. No significant arthralgia or vasomotor symptoms. Bone density monitoring needed due to anastrozole . - Order mammogram at [facility] for February 2026. - Continue  anastrozole  daily.  Bone Health.  Most recent bone density in 2021 and normal.   - Repeat bone density testing  RTC in 6 months for f/u with Dr. Gudena.     The patient was provided an opportunity to ask questions and all were answered. The patient agreed with the plan and demonstrated an understanding of the instructions.   The patient was advised to call back or seek an in-person evaluation if the symptoms worsen or if the condition fails to improve as anticipated.   I provided 20 minutes of non face-to-face telephone visit time during this encounter, and > 50% was spent counseling as documented under my assessment & plan.   Morna Kendall, NP 12/27/23 11:58 AM Medical Oncology and Hematology [facility] [address] [place], [place] [address] Tel. [phone]    Fax. [phone]  *Total Encounter Time as defined by the Centers for Medicare and Medicaid Services includes, in addition to the face-to-face time of a patient visit (documented in the note above) non-face-to-face time: obtaining and reviewing outside history, ordering and reviewing medications, tests or procedures, care coordination (communications with other health care professionals or caregivers) and documentation in the medical record.

## 2023-12-27 ENCOUNTER — Telehealth: Payer: Self-pay | Admitting: Hematology and Oncology

## 2023-12-27 ENCOUNTER — Encounter: Payer: Self-pay | Admitting: Adult Health

## 2023-12-27 ENCOUNTER — Telehealth: Payer: Self-pay

## 2023-12-27 NOTE — Telephone Encounter (Signed)
 Called and s/w pt and her husband. She was provided phone number for [facility]. They wrote this down and verbalized thanks.

## 2023-12-27 NOTE — Telephone Encounter (Signed)
-----   Message from Morna JAYSON Kendall sent at 12/27/2023 11:56 AM EDT ----- Can you call patient and let her know we got her bone density testing from 2021.  It was normal.  I am going to order repeat testing for [facility].  Will you call her and let her know?  Thanks so much, LC

## 2023-12-27 NOTE — Assessment & Plan Note (Signed)
 [facility]:Screening mammogram detected right breast mass 7 mm retroareolar, axilla negative, biopsy: Grade 2 IDC with apocrine features, LVI not identified, ER 100%, PR 100%, Ki67 10%, HER2 1+ IHC negative Prior history of stroke  08/29/2023: Right lumpectomy: Grade 2 IDC 0.8 cm, margins negative, ER 100%, PR 100%, HER2 1+ negative, Ki-67 10%  Pathology counseling: I discussed the final pathology report of the patient provided  a copy of this report. I discussed the margins as well as lymph node surgeries. We also discussed the final staging along with previously performed ER/PR and HER-2/neu testing.  Treatment plan: +/- Adjuvant radiation therapy followed by Adjuvant antiestrogen therapy  Assessment and Plan Assessment & Plan Breast cancer, status post radiation, on anastrozole  Breast cancer treated with radiation. Skin breakdown resolved. No breast swelling. Anastrozole  started May 2025, well-tolerated with minimal side effects. No significant arthralgia or vasomotor symptoms. Bone density monitoring needed due to anastrozole . - Order mammogram at [facility] for February 2026. - Continue anastrozole  daily.  Bone Health.  Most recent bone density in 2021 and normal.   - Repeat bone density testing  RTC in 6 months for f/u with Dr. Gudena.

## 2023-12-27 NOTE — Telephone Encounter (Signed)
 left vm for pt about scheduled f/u appt date and time

## 2023-12-28 ENCOUNTER — Encounter: Payer: Self-pay | Admitting: Family Medicine

## 2024-01-04 DIAGNOSIS — G629 Polyneuropathy, unspecified: Secondary | ICD-10-CM | POA: Diagnosis not present

## 2024-01-04 DIAGNOSIS — R7303 Prediabetes: Secondary | ICD-10-CM | POA: Diagnosis not present

## 2024-01-04 DIAGNOSIS — I509 Heart failure, unspecified: Secondary | ICD-10-CM | POA: Diagnosis not present

## 2024-01-04 DIAGNOSIS — I129 Hypertensive chronic kidney disease with stage 1 through stage 4 chronic kidney disease, or unspecified chronic kidney disease: Secondary | ICD-10-CM | POA: Diagnosis not present

## 2024-01-04 DIAGNOSIS — N183 Chronic kidney disease, stage 3 unspecified: Secondary | ICD-10-CM | POA: Diagnosis not present

## 2024-01-11 ENCOUNTER — Other Ambulatory Visit (HOSPITAL_BASED_OUTPATIENT_CLINIC_OR_DEPARTMENT_OTHER): Payer: Self-pay

## 2024-01-11 ENCOUNTER — Ambulatory Visit (HOSPITAL_BASED_OUTPATIENT_CLINIC_OR_DEPARTMENT_OTHER)
Admission: RE | Admit: 2024-01-11 | Discharge: 2024-01-11 | Disposition: A | Discharge status: Home / Self Care | Source: Ambulatory Visit | Attending: Adult Health | Admitting: Adult Health

## 2024-01-11 DIAGNOSIS — Z13820 Encounter for screening for osteoporosis: Secondary | ICD-10-CM

## 2024-01-11 DIAGNOSIS — Z79811 Long term (current) use of aromatase inhibitors: Secondary | ICD-10-CM

## 2024-01-15 ENCOUNTER — Ambulatory Visit: Payer: Self-pay | Admitting: *Deleted

## 2024-01-15 NOTE — Telephone Encounter (Signed)
-----   Message from Morna JAYSON Kendall sent at 01/15/2024  8:50 AM EDT ----- Osteopenia.  Please let patient know to continue calcium , vit d, and weight bearing exercises.  Will repeat in 2 years ----- Message ----- From: Interface, Rad Results In Sent: 01/11/2024   6:07 PM EDT To: Morna Dalton Kendall, NP

## 2024-01-15 NOTE — Telephone Encounter (Signed)
Per Lindsey Causey, NP, called pt with message below. Pt verbalized understanding.  

## 2024-01-29 ENCOUNTER — Inpatient Hospital Stay: Admitting: Adult Health

## 2024-01-29 ENCOUNTER — Inpatient Hospital Stay: Attending: Adult Health

## 2024-01-29 ENCOUNTER — Other Ambulatory Visit: Payer: Self-pay

## 2024-01-29 VITALS — BP 110/67 | HR 110 | Temp 97.8°F | Resp 17 | Wt 281.7 lb

## 2024-01-29 DIAGNOSIS — Z1721 Progesterone receptor positive status: Secondary | ICD-10-CM | POA: Diagnosis not present

## 2024-01-29 DIAGNOSIS — D500 Iron deficiency anemia secondary to blood loss (chronic): Secondary | ICD-10-CM | POA: Insufficient documentation

## 2024-01-29 DIAGNOSIS — C50411 Malignant neoplasm of upper-outer quadrant of right female breast: Secondary | ICD-10-CM

## 2024-01-29 DIAGNOSIS — Z170 Estrogen receptor positive status [ER+]: Secondary | ICD-10-CM

## 2024-01-29 DIAGNOSIS — Z87891 Personal history of nicotine dependence: Secondary | ICD-10-CM | POA: Diagnosis not present

## 2024-01-29 DIAGNOSIS — Z86718 Personal history of other venous thrombosis and embolism: Secondary | ICD-10-CM | POA: Diagnosis not present

## 2024-01-29 DIAGNOSIS — Z79811 Long term (current) use of aromatase inhibitors: Secondary | ICD-10-CM | POA: Diagnosis not present

## 2024-01-29 DIAGNOSIS — Z1732 Human epidermal growth factor receptor 2 negative status: Secondary | ICD-10-CM | POA: Diagnosis not present

## 2024-01-29 LAB — CMP (CANCER CENTER ONLY)
ALT: 10 U/L (ref 0–44)
AST: 12 U/L — ABNORMAL LOW (ref 15–41)
Albumin: 4.1 g/dL (ref 3.5–5.0)
Alkaline Phosphatase: 66 U/L (ref 38–126)
Anion gap: 9 (ref 5–15)
BUN: 27 mg/dL — ABNORMAL HIGH (ref 8–23)
CO2: 32 mmol/L (ref 22–32)
Calcium: 10 mg/dL (ref 8.9–10.3)
Chloride: 102 mmol/L (ref 98–111)
Creatinine: 1.49 mg/dL — ABNORMAL HIGH (ref 0.44–1.00)
GFR, Estimated: 36 mL/min — ABNORMAL LOW (ref >60)
Glucose, Bld: 152 mg/dL — ABNORMAL HIGH (ref 70–99)
Potassium: 4.5 mmol/L (ref 3.5–5.1)
Sodium: 143 mmol/L (ref 135–145)
Total Bilirubin: 0.4 mg/dL (ref 0.0–1.2)
Total Protein: 7.1 g/dL (ref 6.5–8.1)

## 2024-01-29 LAB — CBC WITH DIFFERENTIAL (CANCER CENTER ONLY)
Abs Immature Granulocytes: 0.04 K/uL (ref 0.00–0.07)
Basophils Absolute: 0 K/uL (ref 0.0–0.1)
Basophils Relative: 0 %
Eosinophils Absolute: 0.2 K/uL (ref 0.0–0.5)
Eosinophils Relative: 3 %
HCT: 39.2 % (ref 36.0–46.0)
Hemoglobin: 12.6 g/dL (ref 12.0–15.0)
Immature Granulocytes: 1 %
Lymphocytes Relative: 19 %
Lymphs Abs: 1.6 K/uL (ref 0.7–4.0)
MCH: 29.2 pg (ref 26.0–34.0)
MCHC: 32.1 g/dL (ref 30.0–36.0)
MCV: 91 fL (ref 80.0–100.0)
Monocytes Absolute: 0.7 K/uL (ref 0.1–1.0)
Monocytes Relative: 8 %
Neutro Abs: 6.1 K/uL (ref 1.7–7.7)
Neutrophils Relative %: 69 %
Platelet Count: 291 K/uL (ref 150–400)
RBC: 4.31 MIL/uL (ref 3.87–5.11)
RDW: 14.6 % (ref 11.5–15.5)
WBC Count: 8.7 K/uL (ref 4.0–10.5)
nRBC: 0 % (ref 0.0–0.2)

## 2024-01-29 NOTE — Progress Notes (Unsigned)
 [facility] Cancer Follow up:    Candice Marcellus RAMAN, MD [address] [place] [id number]   DIAGNOSIS: Cancer Staging  Malignant neoplasm of upper-outer quadrant of right breast in female, estrogen receptor positive (HCC) Staging form: Breast, AJCC 8th Edition - Clinical: Stage IA (cT1b, cN0, cM0, G2, ER+, PR+, HER2-) - Signed by Odean Potts, MD on 07/25/2023 Histologic grading system: 3 grade system    SUMMARY OF ONCOLOGIC HISTORY: Oncology History  Malignant neoplasm of upper-outer quadrant of right breast in female, estrogen receptor positive (HCC)  07/10/2023 Initial Diagnosis   Screening mammogram detected right breast mass 7 mm retroareolar, axilla negative, biopsy: Grade 2 IDC with apocrine features, LVI not identified, ER 100%, PR 100%, Ki67 10%, HER2 1+ IHC negative   07/25/2023 Cancer Staging   Staging form: Breast, AJCC 8th Edition - Clinical: Stage IA (cT1b, cN0, cM0, G2, ER+, PR+, HER2-) - Signed by Odean Potts, MD on 07/25/2023 Histologic grading system: 3 grade system   08/29/2023 Surgery   Right lumpectomy: Grade 2 IDC 0.8 cm, margins negative, ER 100%, PR 100%, HER2 1+ negative, Ki-67 10%    09/26/2023 - 10/25/2023 Radiation Therapy   Plan Name: Breast_R Site: Breast, Right Technique: 3D Mode: Photon Dose Per Fraction: 2.66 Gy Prescribed Dose (Delivered / Prescribed): 42.56 Gy / 42.56 Gy Prescribed Fxs (Delivered / Prescribed): 16 / 16   Plan Name: Breast_R_Bst Site: Breast, Right Technique: 3D Mode: Photon Dose Per Fraction: 2 Gy Prescribed Dose (Delivered / Prescribed): 10 Gy / 10 Gy Prescribed Fxs (Delivered / Prescribed): 5 / 5   09/2023 -  Anti-estrogen oral therapy   Anastrozole  daily     CURRENT THERAPY:  INTERVAL HISTORY:  Discussed the use of AI scribe software for clinical note transcription with the patient, who gave verbal consent to proceed.  History of Present Illness Candice Perez is a 73 year old female who  presents for follow-up on anastrozole  therapy.  She has been doing well since her last visit. Radiation treatment concluded successfully, though the last week was challenging. She experiences no cough, shortness of breath, chest pain, or palpitations.  A recent bone density test shows mild osteopenia with a T-score of -2.0 in the right femoral neck. She takes vitamin D regularly.     Patient Active Problem List   Diagnosis Date Noted  . Malignant neoplasm of upper-outer quadrant of right breast in female, estrogen receptor positive (HCC) 07/25/2023  . CHF (congestive heart failure) (HCC) 11/15/2022  . Symptomatic bradycardia 08/17/2021  . Dyspnea 08/17/2021  . AKI (acute kidney injury) (HCC) 08/17/2021  . Paroxysmal A-fib (HCC) 08/17/2021  . History of stroke 08/17/2021  . HLD (hyperlipidemia) 08/17/2021  . Hypothyroidism 08/17/2021  . Chronic systolic CHF (congestive heart failure) (HCC) 08/17/2021  . Cerebrovascular accident (CVA) due to embolism of left middle cerebral artery (HCC) 03/29/2021  . Right leg pain 03/29/2021  . Snoring 03/29/2021  . Right hemiparesis (HCC)   . Essential hypertension   . Sinus tachycardia   . Acute lower UTI   . Left middle cerebral artery stroke (HCC) 12/09/2020  . Right hemiplegia (HCC) 12/09/2020  . Acute respiratory failure with hypoxia (HCC)   . Cerebrovascular accident (CVA) (HCC) 12/02/2020  . Middle cerebral artery embolism, left 12/02/2020  . TIA (transient ischemic attack) 06/15/2017  . Morbid obesity (HCC) 08/27/2013  . Expected blood loss anemia 08/27/2013  . S/P left TKA 08/26/2013  . Deep vein thrombosis of left lower extremity (HCC) 01/03/2013  .  Varicose veins of bilateral lower extremities with other complications 05/01/2011  . Chronic venous insufficiency 12/09/2010    has no known allergies.  MEDICAL HISTORY: Past Medical History:  Diagnosis Date  . A-fib (HCC)   . Arthritis   . Breast cancer (HCC) 07/2023  . DVT  (deep venous thrombosis) (HCC)   . Dyspnea   . Dysrhythmia    a-fib  . Edema   . GERD (gastroesophageal reflux disease)    OCCASIONAL - PRILOSEC IF NEEDED  . Hyperlipidemia   . Hypertension    no meds now  . Hypothyroidism   . Stroke (HCC) 2022   r side weakness (hand)  . Varicose veins     SURGICAL HISTORY: Past Surgical History:  Procedure Laterality Date  . APPENDECTOMY  1978  . BREAST BIOPSY  08/28/2023   MM RT RADIOACTIVE SEED LOC MAMMO GUIDE 08/28/2023 [facility] MAMMOGRAPHY  . BREAST LUMPECTOMY WITH RADIOACTIVE SEED LOCALIZATION Right 08/29/2023   Procedure: BREAST LUMPECTOMY WITH RADIOACTIVE SEED LOCALIZATION;  Surgeon: Curvin Deward MOULD, MD;  Location: [facility] OR;  Service: General;  Laterality: Right;  RIGHT BREAST RADIOACTIVE SEED LOCALIZED LUMPECTOMY  . CHOLECYSTECTOMY     Laparoscopic  . CYSTOCELE REPAIR    . HERNIA REPAIR    . IR CT HEAD LTD  12/02/2020  . IR PERCUTANEOUS ART THROMBECTOMY/INFUSION INTRACRANIAL INC DIAG ANGIO  12/02/2020  . RADIOLOGY WITH ANESTHESIA N/A 12/02/2020   Procedure: IR WITH ANESTHESIA;  Surgeon: Dolphus Carrion, MD;  Location: [facility] OR;  Service: Radiology;  Laterality: N/A;  . RIGHT/LEFT HEART CATH AND CORONARY ANGIOGRAPHY N/A 11/20/2022   Procedure: RIGHT/LEFT HEART CATH AND CORONARY ANGIOGRAPHY;  Surgeon: Claudene Pacific, MD;  Location: [facility] INVASIVE CV LAB;  Service: Cardiovascular;  Laterality: N/A;  . TONSILLECTOMY    . TOTAL KNEE ARTHROPLASTY Left 08/26/2013   Procedure: LEFT TOTAL KNEE REVISION;  Surgeon: Donnice JONETTA Car, MD;  Location: [facility] ORS;  Service: Orthopedics;  Laterality: Left;  SABRA VARICOSE VEIN SURGERY      SOCIAL HISTORY: Social History   Socioeconomic History  . Marital status: Married    Spouse name: Not on file  . Number of children: 2  . Years of education: 9  . Highest education level: Not on file  Occupational History  . Occupation: Retired  Tobacco Use  . Smoking status: Former    Current packs/day: 0.00    Types:  Cigarettes    Start date: 09/17/1970    Quit date: 09/17/2010    Years since quitting: 13.3  . Smokeless tobacco: Never  Vaping Use  . Vaping status: Never Used  Substance and Sexual Activity  . Alcohol use: No  . Drug use: No  . Sexual activity: Not on file  Other Topics Concern  . Not on file  Social History Narrative   Lives at home with her husband.   Right-handed.   No daily caffeine use.   Social Drivers of Corporate investment banker Strain: Not on file  Food Insecurity: No Food Insecurity (11/15/2022)   Hunger Vital Sign   . Worried About Programme researcher, broadcasting/film/video in the Last Year: Never true   . Ran Out of Food in the Last Year: Never true  Transportation Needs: No Transportation Needs (11/15/2022)   PRAPARE - Transportation   . Lack of Transportation (Medical): No   . Lack of Transportation (Non-Medical): No  Physical Activity: Not on file  Stress: Not on file  Social Connections: Not on file  Intimate Partner Violence:  Not At Risk (11/15/2022)   Humiliation, Afraid, Rape, and Kick questionnaire   . Fear of Current or Ex-Partner: No   . Emotionally Abused: No   . Physically Abused: No   . Sexually Abused: No    FAMILY HISTORY: Family History  Problem Relation Age of Onset  . Congestive Heart Failure Mother   . Cancer Mother        tongue/sinus  . Cancer Father        in his back  . Other Father        Varicose veins, Arteriosclerosis  . Breast cancer Sister     Review of Systems - Oncology    PHYSICAL EXAMINATION   Onc Performance Status - 01/29/24 1137       ECOG Perf Status   ECOG Perf Status Restricted in physically strenuous activity but ambulatory and able to carry out work of a light or sedentary nature, e.g., light house work, office work      KPS SCALE   KPS % SCORE Able to carry on normal activity, minor s/s of disease          Vitals:   01/29/24 1138  BP: 110/67  Pulse: (!) 110  Resp: 17  Temp: 97.8 F (36.6 C)  SpO2: 90%     Physical Exam  LABORATORY DATA:  CBC    Component Value Date/Time   WBC 8.7 01/29/2024 1117   WBC 10.2 08/28/2023 1058   RBC 4.31 01/29/2024 1117   HGB 12.6 01/29/2024 1117   HGB 12.9 06/15/2017 0930   HCT 39.2 01/29/2024 1117   HCT 38.6 06/15/2017 0930   PLT 291 01/29/2024 1117   PLT 265 06/15/2017 0930   MCV 91.0 01/29/2024 1117   MCV 86 06/15/2017 0930   MCH 29.2 01/29/2024 1117   MCHC 32.1 01/29/2024 1117   RDW 14.6 01/29/2024 1117   RDW 14.5 06/15/2017 0930   LYMPHSABS 1.6 01/29/2024 1117   MONOABS 0.7 01/29/2024 1117   EOSABS 0.2 01/29/2024 1117   BASOSABS 0.0 01/29/2024 1117    CMP     Component Value Date/Time   NA 141 08/28/2023 1058   NA 142 06/15/2017 0930   K 4.2 08/28/2023 1058   CL 101 08/28/2023 1058   CO2 28 08/28/2023 1058   GLUCOSE 104 (H) 08/28/2023 1058   BUN 24 (H) 08/28/2023 1058   BUN 19 06/15/2017 0930   CREATININE 1.39 (H) 08/28/2023 1058   CALCIUM  9.8 08/28/2023 1058   PROT 6.9 11/23/2022 1303   PROT 7.0 06/15/2017 0930   ALBUMIN  3.1 (L) 11/23/2022 1303   ALBUMIN  4.2 06/15/2017 0930   AST 67 (H) 11/23/2022 1303   ALT 89 (H) 11/23/2022 1303   ALKPHOS 61 11/23/2022 1303   BILITOT 0.5 11/23/2022 1303   BILITOT 0.2 06/15/2017 0930   GFRNONAA 40 (L) 08/28/2023 1058   GFRAA 68 06/15/2017 0930     ASSESSMENT and THERAPY PLAN:   No problem-specific Assessment & Plan notes found for this encounter.     All questions were answered. The patient knows to call the clinic with any problems, questions or concerns. We can certainly see the patient much sooner if necessary.  Total encounter time:*** minutes*in face-to-face visit time, chart review, lab review, care coordination, order entry, and documentation of the encounter time.    Morna Kendall, NP 01/29/24 11:41 AM Medical Oncology and Hematology [facility] [address] [place], [place] [address] Tel. [phone]    Fax. [phone]  *  Total Encounter  Time as defined by the Centers for Medicare and Medicaid Services includes, in addition to the face-to-face time of a patient visit (documented in the note above) non-face-to-face time: obtaining and reviewing outside history, ordering and reviewing medications, tests or procedures, care coordination (communications with other health care professionals or caregivers) and documentation in the medical record.

## 2024-01-30 ENCOUNTER — Other Ambulatory Visit (HOSPITAL_COMMUNITY): Payer: Self-pay

## 2024-01-30 ENCOUNTER — Other Ambulatory Visit (HOSPITAL_BASED_OUTPATIENT_CLINIC_OR_DEPARTMENT_OTHER): Payer: Self-pay

## 2024-01-30 MED ORDER — GABAPENTIN 400 MG PO CAPS
400.0000 mg | ORAL_CAPSULE | Freq: Every day | ORAL | 3 refills | Status: AC
  Filled 2024-01-30: qty 90, 90d supply, fill #0
  Filled 2024-04-15: qty 90, 90d supply, fill #1

## 2024-01-30 MED ORDER — DILTIAZEM HCL ER COATED BEADS 240 MG PO CP24
240.0000 mg | ORAL_CAPSULE | Freq: Every morning | ORAL | 3 refills | Status: DC
  Filled 2024-01-30 – 2024-02-19 (×2): qty 90, 90d supply, fill #0

## 2024-01-31 ENCOUNTER — Encounter: Payer: Self-pay | Admitting: Adult Health

## 2024-02-01 ENCOUNTER — Ambulatory Visit: Payer: Self-pay

## 2024-02-01 NOTE — Telephone Encounter (Addendum)
 Called pt. Per Np message below. Pt verbalized understanding.----- Message from Morna JAYSON Kendall sent at 01/30/2024  1:04 PM EDT ----- Patient kidney function continues to increase.  Please reach out to patient, ensure she is drinking enough water  and get her PCP name so we can fax labs to them.   ----- Message ----- From: Interface, Lab In [place] Sent: 01/29/2024  11:30 AM EDT To: Morna Dalton Kendall, NP

## 2024-02-12 ENCOUNTER — Other Ambulatory Visit (HOSPITAL_BASED_OUTPATIENT_CLINIC_OR_DEPARTMENT_OTHER): Payer: Self-pay

## 2024-02-12 MED ORDER — AMLODIPINE BESYLATE 10 MG PO TABS
10.0000 mg | ORAL_TABLET | Freq: Every day | ORAL | 3 refills | Status: AC
  Filled 2024-02-19: qty 90, 90d supply, fill #0

## 2024-02-12 MED ORDER — CARVEDILOL 12.5 MG PO TABS
12.5000 mg | ORAL_TABLET | Freq: Every morning | ORAL | 3 refills | Status: AC
  Filled 2024-02-12 – 2024-04-15 (×3): qty 90, 90d supply, fill #0

## 2024-02-12 MED ORDER — LEVOTHYROXINE SODIUM 125 MCG PO TABS
125.0000 ug | ORAL_TABLET | Freq: Every morning | ORAL | 3 refills | Status: DC
  Filled 2024-02-19: qty 90, 90d supply, fill #0

## 2024-02-12 MED ORDER — TORSEMIDE 20 MG PO TABS
20.0000 mg | ORAL_TABLET | Freq: Two times a day (BID) | ORAL | 3 refills | Status: DC
  Filled 2024-03-20: qty 240, 60d supply, fill #0

## 2024-02-12 MED ORDER — APIXABAN 5 MG PO TABS
5.0000 mg | ORAL_TABLET | Freq: Two times a day (BID) | ORAL | 3 refills | Status: DC
  Filled 2024-02-12: qty 180, 90d supply, fill #0
  Filled 2024-02-27: qty 60, 30d supply, fill #1

## 2024-02-12 MED ORDER — ROSUVASTATIN CALCIUM 20 MG PO TABS
20.0000 mg | ORAL_TABLET | Freq: Every day | ORAL | 3 refills | Status: DC
  Filled 2024-02-19: qty 90, 90d supply, fill #0

## 2024-02-18 ENCOUNTER — Telehealth: Payer: Self-pay

## 2024-02-18 NOTE — Telephone Encounter (Signed)
 PAP: Application for Eliquis has been submitted to [employer] ([employer]), via fax

## 2024-02-19 ENCOUNTER — Other Ambulatory Visit: Payer: Self-pay

## 2024-02-19 ENCOUNTER — Other Ambulatory Visit (HOSPITAL_BASED_OUTPATIENT_CLINIC_OR_DEPARTMENT_OTHER): Payer: Self-pay

## 2024-02-19 MED ORDER — DILTIAZEM HCL ER 240 MG PO CP24
240.0000 mg | ORAL_CAPSULE | Freq: Every morning | ORAL | 3 refills | Status: AC
  Filled 2024-02-19: qty 90, 90d supply, fill #0
  Filled 2024-06-18: qty 90, 90d supply, fill #1

## 2024-02-19 MED ORDER — DIGOXIN 125 MCG PO TABS
0.0625 mg | ORAL_TABLET | Freq: Every day | ORAL | 3 refills | Status: AC
  Filled 2024-02-19: qty 45, 90d supply, fill #0
  Filled 2024-05-16: qty 45, 90d supply, fill #1

## 2024-02-19 MED ORDER — DILTIAZEM HCL ER 120 MG PO CP24
120.0000 mg | ORAL_CAPSULE | Freq: Every evening | ORAL | 3 refills | Status: DC
  Filled 2024-02-19: qty 90, 90d supply, fill #0

## 2024-02-19 MED ORDER — HYDRALAZINE HCL 25 MG PO TABS
25.0000 mg | ORAL_TABLET | Freq: Two times a day (BID) | ORAL | 3 refills | Status: DC
  Filled 2024-02-19: qty 180, 90d supply, fill #0

## 2024-02-19 MED ORDER — CARVEDILOL 6.25 MG PO TABS
6.2500 mg | ORAL_TABLET | Freq: Two times a day (BID) | ORAL | 3 refills | Status: DC
  Filled 2024-02-19: qty 180, 90d supply, fill #0

## 2024-02-20 ENCOUNTER — Other Ambulatory Visit (HOSPITAL_BASED_OUTPATIENT_CLINIC_OR_DEPARTMENT_OTHER): Payer: Self-pay

## 2024-02-20 ENCOUNTER — Other Ambulatory Visit (HOSPITAL_COMMUNITY): Payer: Self-pay

## 2024-02-20 MED ORDER — DILTIAZEM HCL ER 120 MG PO CP24
120.0000 mg | ORAL_CAPSULE | Freq: Every evening | ORAL | 3 refills | Status: AC
  Filled 2024-02-22 – 2024-05-16 (×4): qty 90, 90d supply, fill #0

## 2024-02-22 ENCOUNTER — Other Ambulatory Visit (HOSPITAL_BASED_OUTPATIENT_CLINIC_OR_DEPARTMENT_OTHER): Payer: Self-pay

## 2024-02-25 ENCOUNTER — Other Ambulatory Visit (HOSPITAL_BASED_OUTPATIENT_CLINIC_OR_DEPARTMENT_OTHER): Payer: Self-pay

## 2024-02-25 MED ORDER — POTASSIUM CHLORIDE ER 20 MEQ PO TBCR
1.0000 | EXTENDED_RELEASE_TABLET | Freq: Every day | ORAL | 0 refills | Status: AC
  Filled 2024-02-25: qty 90, 90d supply, fill #0

## 2024-02-26 ENCOUNTER — Other Ambulatory Visit (HOSPITAL_BASED_OUTPATIENT_CLINIC_OR_DEPARTMENT_OTHER): Payer: Self-pay

## 2024-02-27 ENCOUNTER — Telehealth: Payer: Self-pay

## 2024-02-27 ENCOUNTER — Other Ambulatory Visit (HOSPITAL_COMMUNITY): Payer: Self-pay

## 2024-02-27 NOTE — Telephone Encounter (Signed)
 PAP: Patient has been denied for patient assistance by [employer] ([employer]) due to not meeting 3% oop

## 2024-02-27 NOTE — Telephone Encounter (Signed)
  The patient was approved for a [employer] grant that will help cover the cost of Eliquis  Total amount awarded, $7500.  Effective: 01/28/2024 - 01/26/2025   APW:[id number] ERW:[id number] Hmnle:[id number] PI:[id number]   Pharmacy provided with approval and processing information. Patient informed via telephone  Inocente Butcher CPhT  Pharmacy Patient Advocate Specialist  Direct Number: [phone] Fax: [phone]

## 2024-02-28 ENCOUNTER — Other Ambulatory Visit (HOSPITAL_BASED_OUTPATIENT_CLINIC_OR_DEPARTMENT_OTHER): Payer: Self-pay

## 2024-02-29 ENCOUNTER — Other Ambulatory Visit (HOSPITAL_BASED_OUTPATIENT_CLINIC_OR_DEPARTMENT_OTHER): Payer: Self-pay

## 2024-03-07 ENCOUNTER — Ambulatory Visit: Attending: Cardiology | Admitting: Cardiology

## 2024-03-07 ENCOUNTER — Encounter: Payer: Self-pay | Admitting: Cardiology

## 2024-03-07 VITALS — BP 130/68 | HR 85 | Ht 67.0 in | Wt 284.0 lb

## 2024-03-07 DIAGNOSIS — I5022 Chronic systolic (congestive) heart failure: Secondary | ICD-10-CM

## 2024-03-07 DIAGNOSIS — C50411 Malignant neoplasm of upper-outer quadrant of right female breast: Secondary | ICD-10-CM

## 2024-03-07 DIAGNOSIS — E782 Mixed hyperlipidemia: Secondary | ICD-10-CM

## 2024-03-07 DIAGNOSIS — G8191 Hemiplegia, unspecified affecting right dominant side: Secondary | ICD-10-CM | POA: Diagnosis not present

## 2024-03-07 DIAGNOSIS — R0609 Other forms of dyspnea: Secondary | ICD-10-CM | POA: Diagnosis not present

## 2024-03-07 DIAGNOSIS — I480 Paroxysmal atrial fibrillation: Secondary | ICD-10-CM

## 2024-03-07 DIAGNOSIS — I83893 Varicose veins of bilateral lower extremities with other complications: Secondary | ICD-10-CM

## 2024-03-07 DIAGNOSIS — I10 Essential (primary) hypertension: Secondary | ICD-10-CM | POA: Diagnosis not present

## 2024-03-07 DIAGNOSIS — Z170 Estrogen receptor positive status [ER+]: Secondary | ICD-10-CM

## 2024-03-07 NOTE — Patient Instructions (Signed)
 Medication Instructions:  Your physician recommends that you continue on your current medications as directed. Please refer to the Current Medication list given to you today.  *If you need a refill on your cardiac medications before your next appointment, please call your pharmacy*   Lab Work: ProBNP, CMP, TSH- today If you have labs (blood work) drawn today and your tests are completely normal, you will receive your results only by: MyChart Message (if you have MyChart) OR A paper copy in the mail If you have any lab test that is abnormal or we need to change your treatment, we will call you to review the results.   Testing/Procedures: Your physician has requested that you have an echocardiogram. Echocardiography is a painless test that uses sound waves to create images of your heart. It provides your doctor with information about the size and shape of your heart and how well your heart's chambers and valves are working. This procedure takes approximately one hour. There are no restrictions for this procedure. Please do NOT wear cologne, perfume, aftershave, or lotions (deodorant is allowed). Please arrive 15 minutes prior to your appointment time.  Please note: We ask at that you not bring children with you during ultrasound (echo/ vascular) testing. Due to room size and safety concerns, children are not allowed in the ultrasound rooms during exams. Our front office staff cannot provide observation of children in our lobby area while testing is being conducted. An adult accompanying a patient to their appointment will only be allowed in the ultrasound room at the discretion of the ultrasound technician under special circumstances. We apologize for any inconvenience.    Follow-Up: At [facility], you and your health needs are our priority.  As part of our continuing mission to provide you with exceptional heart care, we have created designated Provider Care Teams.  These Care Teams include  your primary Cardiologist (physician) and Advanced Practice Providers (APPs -  Physician Assistants and Nurse Practitioners) who all work together to provide you with the care you need, when you need it.  We recommend signing up for the patient portal called MyChart.  Sign up information is provided on this After Visit Summary.  MyChart is used to connect with patients for Virtual Visits (Telemedicine).  Patients are able to view lab/test results, encounter notes, upcoming appointments, etc.  Non-urgent messages can be sent to your provider as well.   To learn more about what you can do with MyChart, go to [email or web address].    Your next appointment:   2 month(s)  The format for your next appointment:   In Person  Provider:   Lamar Fitch, MD    Other Instructions NA

## 2024-03-07 NOTE — Progress Notes (Signed)
 Cardiology Consultation:    Date:  03/07/2024   ID:  Candice Perez, DOB 10-20-1950, MRN [id number]  PCP:  Ina Marcellus RAMAN, MD  Cardiologist:  Lamar Fitch, MD   Referring MD: Ina Marcellus RAMAN, MD   Chief Complaint  Patient presents with   Establish Care    History of Present Illness:    Candice Perez is a 73 y.o. female who is being seen today for the evaluation of have multiple cardiac issues at the request of Ina Marcellus RAMAN, MD. past medical history significant for CVA couple years ago, paroxysmal atrial fibrillation, essential hypertension, cardiomyopathy with a ejection fraction in the neighborhood of 45% in 2023, essential hypertension, hypothyroidism.  She would like to be established as a patient.  Overall she is doing fair she can walk a lot around a little bit complain of having some swelling legs some shortness of breath but no chest pain tightness squeezing pressure mid chest no dizziness no passing out.  She used to smoke but quit smoking 2012, she does not have family history of coronary artery disease, she lives with her husband who is a with her in the room and he participated in the decision making.  Not on a special diet does not exercise on a regular basis denies having ever heart attack.  But does have history of cardiomyopathy.  Also some kidney failure followed by nephrologist  Past Medical History:  Diagnosis Date   A-fib [facility])    Arthritis    Breast cancer (HCC) 07/2023   DVT (deep venous thrombosis) (HCC)    Dyspnea    Dysrhythmia    a-fib   Edema    GERD (gastroesophageal reflux disease)    OCCASIONAL - PRILOSEC IF NEEDED   Hyperlipidemia    Hypertension    no meds now   Hypothyroidism    Stroke (HCC) 2022   r side weakness (hand)   Varicose veins     Past Surgical History:  Procedure Laterality Date   APPENDECTOMY  1978   BREAST BIOPSY  08/28/2023   MM RT RADIOACTIVE SEED LOC MAMMO GUIDE 08/28/2023 [facility] MAMMOGRAPHY   BREAST LUMPECTOMY  WITH RADIOACTIVE SEED LOCALIZATION Right 08/29/2023   Procedure: BREAST LUMPECTOMY WITH RADIOACTIVE SEED LOCALIZATION;  Surgeon: Curvin Deward MOULD, MD;  [facility];  Service: General;  Laterality: Right;  RIGHT BREAST RADIOACTIVE SEED LOCALIZED LUMPECTOMY   CHOLECYSTECTOMY     Laparoscopic   CYSTOCELE REPAIR     HERNIA REPAIR     IR CT HEAD LTD  12/02/2020   IR PERCUTANEOUS ART THROMBECTOMY/INFUSION INTRACRANIAL INC DIAG ANGIO  12/02/2020   RADIOLOGY WITH ANESTHESIA N/A 12/02/2020   Procedure: IR WITH ANESTHESIA;  Surgeon: Dolphus Carrion, MD;  Location: [facility];  Service: Radiology;  Laterality: N/A;   RIGHT/LEFT HEART CATH AND CORONARY ANGIOGRAPHY N/A 11/20/2022   Procedure: RIGHT/LEFT HEART CATH AND CORONARY ANGIOGRAPHY;  Surgeon: Claudene Pacific, MD;  [facility];  Service: Cardiovascular;  Laterality: N/A;   TONSILLECTOMY     TOTAL KNEE ARTHROPLASTY Left 08/26/2013   Procedure: LEFT TOTAL KNEE REVISION;  Surgeon: Donnice JONETTA Car, MD;  Location: [facility] ORS;  Service: Orthopedics;  Laterality: Left;   VARICOSE VEIN SURGERY      Current Medications: Current Meds  Medication Sig   acetaminophen  (TYLENOL ) 650 MG CR tablet Take 1,300 mg by mouth in the morning and at bedtime. In the morning & with lunch   allopurinol  (ZYLOPRIM ) 100 MG tablet Take 1  tablet (100 mg total) by mouth daily.   amLODipine  (NORVASC ) 10 MG tablet Take 1 tablet (10 mg total) by mouth daily.   anastrozole  (ARIMIDEX ) 1 MG tablet Take 1 tablet (1 mg total) by mouth daily.   apixaban  (ELIQUIS ) 5 MG TABS tablet Take 1 tablet (5 mg total) by mouth 2 (two) times daily.   Ascorbic Acid (VITAMIN C) 500 MG CAPS Take 500 mg by mouth in the morning.   Calcium  Carbonate (CALCIUM  600 PO) Take 600 mg by mouth daily.   carvedilol  (COREG ) 12.5 MG tablet Take 1 tablet (12.5 mg total) by mouth in the morning.   Cholecalciferol (VITAMIN D3) 1000 units CAPS Take 1,000 Units by mouth in the morning.   cyanocobalamin  (VITAMIN B12) 1000 MCG tablet Take 1,000 mcg by mouth in the morning.   digoxin  (LANOXIN ) 0.125 MG tablet Take 0.5 tablets (0.0625 mg total) by mouth daily.   diltiazem  (DILACOR XR ) 120 MG 24 hr capsule Take 1 capsule (120 mg total) by mouth  once daily in the evening.   diltiazem  (DILACOR XR ) 240 MG 24 hr capsule Take 1 capsule (240 mg total) by mouth in the morning.   gabapentin  (NEURONTIN ) 400 MG capsule Take 1 capsule (400 mg total) by mouth at bedtime.   hydrALAZINE  (APRESOLINE ) 25 MG tablet Take 1 tablet (25 mg total) by mouth 2 (two) times daily.   levothyroxine  (SYNTHROID ) 125 MCG tablet Take 1 tablet (125 mcg total) by mouth in the morning.   Magnesium  Oxide -Mg Supplement 250 MG TABS Take 250 mg by mouth in the morning.   Multiple Vitamins-Minerals (MULTIVITAMIN WOMEN) TABS Take 1 tablet by mouth in the morning.   Potassium Chloride  ER 20 MEQ TBCR Take 1 tablet (20 mEq total) by mouth daily.   rosuvastatin  (CRESTOR ) 20 MG tablet Take 1 tablet (20 mg total) by mouth daily.   TART CHERRY PO Take 1 tablet by mouth 3 (three) times daily.   torsemide  (DEMADEX ) 20 MG tablet Take 1 tablet (20 mg total) by mouth 2 (two) times daily unless worse swelling take 40 mg in the morning and 20 mg in the afternoon   Zinc 50 MG TABS Take 50 mg by mouth in the morning.     Allergies:   Patient has no known allergies.   Social History   Socioeconomic History   Marital status: Married    Spouse name: Not on file   Number of children: 2   Years of education: 9   Highest education level: Not on file  Occupational History   Occupation: Retired  Tobacco Use   Smoking status: Former    Current packs/day: 0.00    Types: Cigarettes    Start date: 09/17/1970    Quit date: 09/17/2010    Years since quitting: 13.4   Smokeless tobacco: Never  Vaping Use   Vaping status: Never Used  Substance and Sexual Activity   Alcohol use: No   Drug use: No   Sexual activity: Not on file  Other Topics Concern    Not on file  Social History Narrative   Lives at home with her husband.   Right-handed.   No daily caffeine use.   Social Drivers of Corporate investment banker Strain: Not on file  Food Insecurity: No Food Insecurity (11/15/2022)   Hunger Vital Sign    Worried About Running Out of Food in the Last Year: Never true    Ran Out of Food in the Last Year: Never  true  Transportation Needs: No Transportation Needs (11/15/2022)   PRAPARE - Administrator, Civil Service (Medical): No    Lack of Transportation (Non-Medical): No  Physical Activity: Not on file  Stress: Not on file  Social Connections: Not on file     Family History: The patient's family history includes Breast cancer in her sister; Cancer in her father and mother; Congestive Heart Failure in her mother; Other in her father. ROS:   Please see the history of present illness.    All 14 point review of systems negative except as described per history of present illness.  EKGs/Labs/Other Studies Reviewed:    The following studies were reviewed today:   EKG:  EKG Interpretation Date/Time:  Friday March 07 2024 15:57:24 EDT Ventricular Rate:  85 PR Interval:    QRS Duration:  80 QT Interval:  358 QTC Calculation: 426 R Axis:   15  Text Interpretation: Atrial fibrillation Low voltage QRS Cannot rule out Anterior infarct , age undetermined When compared with ECG of 16-Nov-2022 06:35, Criteria for Inferior infarct are no longer Present Confirmed by Bernie Charleston [phone]) on 03/07/2024 4:00:24 PM    Recent Labs: 01/29/2024: ALT 10; BUN 27; Creatinine 1.49; Hemoglobin 12.6; Platelet Count 291; Potassium 4.5; Sodium 143  Recent Lipid Panel    Component Value Date/Time   CHOL 142 12/03/2020 0330   TRIG 107 12/03/2020 0330   TRIG 100 12/03/2020 0330   HDL 40 (L) 12/03/2020 0330   CHOLHDL 3.6 12/03/2020 0330   VLDL 21 12/03/2020 0330   LDLCALC 81 12/03/2020 0330    Physical Exam:    VS:  BP 130/68    Pulse 85   Ht 5' 7 (1.702 m)   Wt 284 lb (128.8 kg)   SpO2 94%   BMI 44.48 kg/m     Wt Readings from Last 3 Encounters:  03/07/24 284 lb (128.8 kg)  01/29/24 281 lb 11.2 oz (127.8 kg)  11/21/23 286 lb 6.4 oz (129.9 kg)     GEN:  Well nourished, well developed in no acute distress HEENT: Normal NECK: No JVD; No carotid bruits LYMPHATICS: No lymphadenopathy CARDIAC: Irregularly irregular, no murmurs, no rubs, no gallops RESPIRATORY:  Clear to auscultation without rales, wheezing or rhonchi  ABDOMEN: Soft, non-tender, non-distended MUSCULOSKELETAL: 1+ edema; No deformity  SKIN: Warm and dry NEUROLOGIC:  Alert and oriented x 3 PSYCHIATRIC:  Normal affect   ASSESSMENT:    1. Essential hypertension   2. Chronic systolic CHF (congestive heart failure) (HCC)   3. Paroxysmal A-fib (HCC)   4. Varicose veins of bilateral lower extremities with other complications   5. Right hemiparesis (HCC)   6. Malignant neoplasm of upper-outer quadrant of right breast in female, estrogen receptor positive (HCC)   7. Mixed hyperlipidemia   8. Morbid obesity (HCC)    PLAN:    In order of problems listed above:  Essential hypertension this is first visit in our office.  Blood pressure seems to well-controlled continue present management. Chronic systolic congestive heart failure will get records from her prior cardiologist will schedule to have another echocardiogram to look at the size and function of the left ventricle as well as size and function of Atrius. Atrial fibrillation I do not know about chronicity of this phenomenon.  In the record from 2023 indicates that she was in sinus rhythm she was put on amiodarone  now she does not take an amiodarone  she is in atrial fibrillation I think she is probably  have permanent atrial fibrillation but will get record from her cardiologist to clarify that. Right hemiparesis is noticed. Malignant breast cancer follow-up excellently like always by our  oncology team. Morbid obesity contributing factor. Swelling of lower extremities.  Will check Chem-7 proBNP as well as echocardiogram to determine component of congestive heart failure with this symptoms.   Medication Adjustments/Labs and Tests Ordered: Current medicines are reviewed at length with the patient today.  Concerns regarding medicines are outlined above.  Orders Placed This Encounter  Procedures   EKG 12-Lead   No orders of the defined types were placed in this encounter.   Signed, Lamar DOROTHA Fitch, MD, [facility]. 03/07/2024 4:12 PM    [facility]

## 2024-03-07 NOTE — Addendum Note (Signed)
 Addended by: ARLOA PLANAS D on: 03/07/2024 04:24 PM   Modules accepted: Orders

## 2024-03-08 LAB — COMPREHENSIVE METABOLIC PANEL WITH GFR
ALT: 15 IU/L (ref 0–32)
AST: 16 IU/L (ref 0–40)
Albumin: 4.7 g/dL (ref 3.8–4.8)
Alkaline Phosphatase: 85 IU/L (ref 49–135)
BUN/Creatinine Ratio: 28 (ref 12–28)
BUN: 27 mg/dL (ref 8–27)
Bilirubin Total: 0.3 mg/dL (ref 0.0–1.2)
CO2: 29 mmol/L (ref 20–29)
Calcium: 10.1 mg/dL (ref 8.7–10.3)
Chloride: 100 mmol/L (ref 96–106)
Creatinine, Ser: 0.95 mg/dL (ref 0.57–1.00)
Globulin, Total: 2.7 g/dL (ref 1.5–4.5)
Glucose: 124 mg/dL — ABNORMAL HIGH (ref 70–99)
Potassium: 4.7 mmol/L (ref 3.5–5.2)
Sodium: 146 mmol/L — ABNORMAL HIGH (ref 134–144)
Total Protein: 7.4 g/dL (ref 6.0–8.5)
eGFR: 62 mL/min/1.73 (ref >59)

## 2024-03-08 LAB — TSH: TSH: 4.68 u[IU]/mL — ABNORMAL HIGH (ref 0.450–4.500)

## 2024-03-08 LAB — PRO B NATRIURETIC PEPTIDE: NT-Pro BNP: 1376 pg/mL — ABNORMAL HIGH (ref 0–738)

## 2024-03-20 ENCOUNTER — Other Ambulatory Visit (HOSPITAL_BASED_OUTPATIENT_CLINIC_OR_DEPARTMENT_OTHER): Payer: Self-pay

## 2024-03-20 ENCOUNTER — Ambulatory Visit: Payer: Self-pay | Admitting: Cardiology

## 2024-03-21 ENCOUNTER — Other Ambulatory Visit (HOSPITAL_BASED_OUTPATIENT_CLINIC_OR_DEPARTMENT_OTHER): Payer: Self-pay

## 2024-03-26 ENCOUNTER — Telehealth: Payer: Self-pay

## 2024-03-26 NOTE — Telephone Encounter (Signed)
 Blood work revealed elevation of TSH which means still thyroid  is not active enough, it need to be followed by primary care physicians

## 2024-04-01 ENCOUNTER — Ambulatory Visit: Attending: Cardiology

## 2024-04-01 DIAGNOSIS — R0609 Other forms of dyspnea: Secondary | ICD-10-CM

## 2024-04-01 LAB — ECHOCARDIOGRAM COMPLETE
AR max vel: 2.03 cm2
AV Area VTI: 2.43 cm2
AV Area mean vel: 2.07 cm2
AV Mean grad: 9 mmHg
AV Peak grad: 17.5 mmHg
Ao pk vel: 2.09 m/s
Area-P 1/2: 6.46 cm2
MV M vel: 2.62 m/s
MV Peak grad: 27.5 mmHg
S' Lateral: 5.2 cm

## 2024-04-02 DIAGNOSIS — E119 Type 2 diabetes mellitus without complications: Secondary | ICD-10-CM | POA: Diagnosis not present

## 2024-04-07 ENCOUNTER — Other Ambulatory Visit (HOSPITAL_BASED_OUTPATIENT_CLINIC_OR_DEPARTMENT_OTHER): Payer: Self-pay

## 2024-04-07 NOTE — Telephone Encounter (Signed)
 Patient read MyChart results on 11/18 regarding labs. Results routed to PCP.  Alan, RN

## 2024-04-11 ENCOUNTER — Other Ambulatory Visit (HOSPITAL_BASED_OUTPATIENT_CLINIC_OR_DEPARTMENT_OTHER): Payer: Self-pay

## 2024-04-15 ENCOUNTER — Other Ambulatory Visit (HOSPITAL_BASED_OUTPATIENT_CLINIC_OR_DEPARTMENT_OTHER): Payer: Self-pay

## 2024-04-17 ENCOUNTER — Other Ambulatory Visit: Payer: Self-pay | Admitting: Cardiology

## 2024-04-17 ENCOUNTER — Other Ambulatory Visit (HOSPITAL_BASED_OUTPATIENT_CLINIC_OR_DEPARTMENT_OTHER): Payer: Self-pay

## 2024-04-18 ENCOUNTER — Other Ambulatory Visit (HOSPITAL_COMMUNITY): Payer: Self-pay

## 2024-04-18 ENCOUNTER — Encounter (HOSPITAL_BASED_OUTPATIENT_CLINIC_OR_DEPARTMENT_OTHER): Payer: Self-pay | Admitting: Pharmacy Technician

## 2024-04-18 ENCOUNTER — Other Ambulatory Visit (HOSPITAL_BASED_OUTPATIENT_CLINIC_OR_DEPARTMENT_OTHER): Payer: Self-pay

## 2024-04-22 ENCOUNTER — Other Ambulatory Visit (HOSPITAL_BASED_OUTPATIENT_CLINIC_OR_DEPARTMENT_OTHER): Payer: Self-pay

## 2024-04-23 ENCOUNTER — Other Ambulatory Visit (HOSPITAL_BASED_OUTPATIENT_CLINIC_OR_DEPARTMENT_OTHER): Payer: Self-pay

## 2024-04-24 ENCOUNTER — Other Ambulatory Visit (HOSPITAL_BASED_OUTPATIENT_CLINIC_OR_DEPARTMENT_OTHER): Payer: Self-pay

## 2024-04-24 MED ORDER — ALLOPURINOL 100 MG PO TABS
100.0000 mg | ORAL_TABLET | Freq: Every day | ORAL | 1 refills | Status: AC
  Filled 2024-04-24: qty 90, 90d supply, fill #0

## 2024-05-09 ENCOUNTER — Encounter: Payer: Self-pay | Admitting: Cardiology

## 2024-05-09 ENCOUNTER — Other Ambulatory Visit (HOSPITAL_BASED_OUTPATIENT_CLINIC_OR_DEPARTMENT_OTHER): Payer: Self-pay

## 2024-05-09 ENCOUNTER — Ambulatory Visit: Attending: Cardiology | Admitting: Cardiology

## 2024-05-09 VITALS — BP 110/60 | HR 80 | Ht 67.0 in | Wt 281.0 lb

## 2024-05-09 DIAGNOSIS — I5022 Chronic systolic (congestive) heart failure: Secondary | ICD-10-CM

## 2024-05-09 DIAGNOSIS — E039 Hypothyroidism, unspecified: Secondary | ICD-10-CM

## 2024-05-09 DIAGNOSIS — I480 Paroxysmal atrial fibrillation: Secondary | ICD-10-CM

## 2024-05-09 DIAGNOSIS — I63412 Cerebral infarction due to embolism of left middle cerebral artery: Secondary | ICD-10-CM

## 2024-05-09 DIAGNOSIS — E782 Mixed hyperlipidemia: Secondary | ICD-10-CM

## 2024-05-09 MED ORDER — TORSEMIDE 20 MG PO TABS
ORAL_TABLET | ORAL | 3 refills | Status: AC
  Filled 2024-05-09: qty 300, 60d supply, fill #0

## 2024-05-09 NOTE — Progress Notes (Signed)
 " Cardiology Office Note:    Date:  05/09/2024   ID:  Candice Perez, DOB 1950/06/15, MRN [id number]  PCP:  Ina Marcellus RAMAN, MD  Cardiologist:  Lamar Fitch, MD    Referring MD: Ina Marcellus RAMAN, MD   Chief Complaint  Patient presents with   Follow-up  Doing fair  History of Present Illness:    Candice Perez is a 73 y.o. female past medical history significant for CVA that she suffered from few years ago, atrial fibrillation which appears to be permanent, essential hypertension,, neuropathy with ejection fraction in the neighborhood 45%, hypothyroidism.  Comes today to my office for follow-up.  Swelling of lower extremities worse again.  However before Christmas her legs were pretty good.  I suspect she did have some dietary indiscretion during the Christmas time and that is why swelling is like that.  Her blood pressure is also on the lower side, denies have any chest pain tightness squeezing pressure burning chest no palpitation dizziness  Past Medical History:  Diagnosis Date   A-fib (HCC)    Arthritis    Breast cancer (HCC) 07/2023   DVT (deep venous thrombosis) (HCC)    Dyspnea    Dysrhythmia    a-fib   Edema    GERD (gastroesophageal reflux disease)    OCCASIONAL - PRILOSEC IF NEEDED   Hyperlipidemia    Hypertension    no meds now   Hypothyroidism    Stroke (HCC) 2022   r side weakness (hand)   Varicose veins     Past Surgical History:  Procedure Laterality Date   APPENDECTOMY  1978   BREAST BIOPSY  08/28/2023   MM RT RADIOACTIVE SEED LOC MAMMO GUIDE 08/28/2023 [facility] MAMMOGRAPHY   BREAST LUMPECTOMY WITH RADIOACTIVE SEED LOCALIZATION Right 08/29/2023   Procedure: BREAST LUMPECTOMY WITH RADIOACTIVE SEED LOCALIZATION;  Surgeon: Curvin Deward MOULD, MD;  Location: [facility] OR;  Service: General;  Laterality: Right;  RIGHT BREAST RADIOACTIVE SEED LOCALIZED LUMPECTOMY   CHOLECYSTECTOMY     Laparoscopic   CYSTOCELE REPAIR     HERNIA REPAIR     IR CT HEAD LTD  12/02/2020    IR PERCUTANEOUS ART THROMBECTOMY/INFUSION INTRACRANIAL INC DIAG ANGIO  12/02/2020   RADIOLOGY WITH ANESTHESIA N/A 12/02/2020   Procedure: IR WITH ANESTHESIA;  Surgeon: Dolphus Carrion, MD;  Location: [facility] OR;  Service: Radiology;  Laterality: N/A;   RIGHT/LEFT HEART CATH AND CORONARY ANGIOGRAPHY N/A 11/20/2022   Procedure: RIGHT/LEFT HEART CATH AND CORONARY ANGIOGRAPHY;  Surgeon: Claudene Pacific, MD;  [facility];  Service: Cardiovascular;  Laterality: N/A;   TONSILLECTOMY     TOTAL KNEE ARTHROPLASTY Left 08/26/2013   Procedure: LEFT TOTAL KNEE REVISION;  Surgeon: Donnice JONETTA Car, MD;  Location: [facility];  Service: Orthopedics;  Laterality: Left;   VARICOSE VEIN SURGERY      Current Medications: Active Medications[1]   Allergies:   Patient has no known allergies.   Social History   Socioeconomic History   Marital status: Married    Spouse name: Not on file   Number of children: 2   Years of education: 9   Highest education level: Not on file  Occupational History   Occupation: Retired  Tobacco Use   Smoking status: Former    Current packs/day: 0.00    Types: Cigarettes    Start date: 09/17/1970    Quit date: 09/17/2010    Years since quitting: 13.6   Smokeless tobacco: Never  Vaping Use   Vaping  status: Never Used  Substance and Sexual Activity   Alcohol use: No   Drug use: No   Sexual activity: Not on file  Other Topics Concern   Not on file  Social History Narrative   Lives at home with her husband.   Right-handed.   No daily caffeine use.   Social Drivers of Health   Tobacco Use: Medium Risk (05/09/2024)   Patient History    Smoking Tobacco Use: Former    Smokeless Tobacco Use: Never    Passive Exposure: Not on file  Financial Resource Strain: Not on file  Food Insecurity: No Food Insecurity (11/15/2022)   Hunger Vital Sign    Worried About Running Out of Food in the Last Year: Never true    Ran Out of Food in the Last Year: Never true   Transportation Needs: No Transportation Needs (11/15/2022)   PRAPARE - Administrator, Civil Service (Medical): No    Lack of Transportation (Non-Medical): No  Physical Activity: Not on file  Stress: Not on file  Social Connections: Not on file  Depression (PHQ2-9): Low Risk (01/29/2024)   Depression (PHQ2-9)    PHQ-2 Score: 0  Alcohol Screen: Not on file  Housing: Low Risk (01/29/2024)   Epic    Unable to Pay for Housing in the Last Year: No    Number of Times Moved in the Last Year: 0    Homeless in the Last Year: No  Utilities: Not At Risk (11/15/2022)   [employer] Utilities    Threatened with loss of utilities: No  Health Literacy: Not on file     Family History: The patient's family history includes Breast cancer in her sister; Cancer in her father and mother; Congestive Heart Failure in her mother; Other in her father. ROS:   Please see the history of present illness.    All 14 point review of systems negative except as described per history of present illness  EKGs/Labs/Other Studies Reviewed:         Recent Labs: 01/29/2024: Hemoglobin 12.6; Platelet Count 291 03/07/2024: ALT 15; BUN 27; Creatinine, Ser 0.95; NT-Pro BNP 1,376; Potassium 4.7; Sodium 146; TSH 4.680  Recent Lipid Panel    Component Value Date/Time   CHOL 142 12/03/2020 0330   TRIG 107 12/03/2020 0330   TRIG 100 12/03/2020 0330   HDL 40 (L) 12/03/2020 0330   CHOLHDL 3.6 12/03/2020 0330   VLDL 21 12/03/2020 0330   LDLCALC 81 12/03/2020 0330    Physical Exam:    VS:  BP 110/60   Pulse 80   Ht 5' 7 (1.702 m)   Wt 281 lb (127.5 kg)   SpO2 95%   BMI 44.01 kg/m     Wt Readings from Last 3 Encounters:  05/09/24 281 lb (127.5 kg)  03/07/24 284 lb (128.8 kg)  01/29/24 281 lb 11.2 oz (127.8 kg)     GEN:  Well nourished, well developed in no acute distress HEENT: Normal NECK: No JVD; No carotid bruits LYMPHATICS: No lymphadenopathy CARDIAC: Irregularly irregular, no murmurs, no rubs, no  gallops RESPIRATORY:  Clear to auscultation without rales, wheezing or rhonchi  ABDOMEN: Soft, non-tender, non-distended MUSCULOSKELETAL:  No edema; No deformity  SKIN: Warm and dry LOWER EXTREMITIES: 2+ swelling NEUROLOGIC:  Alert and oriented x 3 PSYCHIATRIC:  Normal affect   ASSESSMENT:    1. Chronic systolic CHF (congestive heart failure) (HCC)   2. Cerebrovascular accident (CVA) due to embolism of left middle cerebral artery (HCC)  3. Paroxysmal A-fib (HCC)   4. Acquired hypothyroidism   5. Mixed hyperlipidemia    PLAN:    In order of problems listed above:  Chronic systolic congestive heart failure, we will discontinue her amlodipine  which should help with the swelling, will increase dose of torsemide  she takes 40 mg twice daily will go to 60 in the morning 40 in the afternoon, Chem-7 proBNP will be repeated next week. Atrial fibrillation suspect this is permanent phenomenon.  Continue anticoagulation and dose is appropriate to her weight age and kidney function. Hypothyroidism.  Need to be addressed by primary care physician, her TSH is [place] elevated.  Not suggest hypothyroidism. Mixed dyslipidemia, I did review [facility] show me total cholesterol from January 2025 128 HDL 37 seen somebody bring him back to my office next week to have Chem-7 done I will recheck fasting lipid profile as well   Medication Adjustments/Labs and Tests Ordered: Current medicines are reviewed at length with the patient today.  Concerns regarding medicines are outlined above.  No orders of the defined types were placed in this encounter.  Medication changes: No orders of the defined types were placed in this encounter.   Signed, Lamar DOROTHA Fitch, MD, [facility] 05/09/2024 9:40 AM    [facility]    [1]  Current Meds  Medication Sig   acetaminophen  (TYLENOL ) 650 MG CR tablet Take 1,300 mg by mouth in the morning and at bedtime. In the morning & with lunch   allopurinol   (ZYLOPRIM ) 100 MG tablet Take 1 tablet (100 mg total) by mouth daily.   allopurinol  (ZYLOPRIM ) 100 MG tablet Take 1 tablet (100 mg total) by mouth daily.   amLODipine  (NORVASC ) 10 MG tablet Take 1 tablet (10 mg total) by mouth daily.   anastrozole  (ARIMIDEX ) 1 MG tablet Take 1 tablet (1 mg total) by mouth daily.   apixaban  (ELIQUIS ) 5 MG TABS tablet Take 1 tablet (5 mg total) by mouth 2 (two) times daily.   Ascorbic Acid (VITAMIN C) 500 MG CAPS Take 500 mg by mouth in the morning.   Calcium  Carbonate (CALCIUM  600 PO) Take 600 mg by mouth daily.   carvedilol  (COREG ) 12.5 MG tablet Take 1 tablet (12.5 mg total) by mouth in the morning.   Cholecalciferol (VITAMIN D3) 1000 units CAPS Take 1,000 Units by mouth in the morning.   cyanocobalamin (VITAMIN B12) 1000 MCG tablet Take 1,000 mcg by mouth in the morning.   digoxin  (LANOXIN ) 0.125 MG tablet Take 0.5 tablets (0.0625 mg total) by mouth daily.   diltiazem  (DILACOR XR ) 120 MG 24 hr capsule Take 1 capsule (120 mg total) by mouth  once daily in the evening.   diltiazem  (DILACOR XR ) 240 MG 24 hr capsule Take 1 capsule (240 mg total) by mouth in the morning.   gabapentin  (NEURONTIN ) 400 MG capsule Take 1 capsule (400 mg total) by mouth at bedtime.   hydrALAZINE  (APRESOLINE ) 25 MG tablet Take 1 tablet (25 mg total) by mouth 2 (two) times daily.   levothyroxine  (SYNTHROID ) 125 MCG tablet Take 1 tablet (125 mcg total) by mouth in the morning.   Magnesium  Oxide -Mg Supplement 250 MG TABS Take 250 mg by mouth in the morning.   Multiple Vitamins-Minerals (MULTIVITAMIN WOMEN) TABS Take 1 tablet by mouth in the morning.   Potassium Chloride  ER 20 MEQ TBCR Take 1 tablet (20 mEq total) by mouth daily.   rosuvastatin  (CRESTOR ) 20 MG tablet Take 1 tablet (20 mg total) by mouth daily.  TART CHERRY PO Take 1 tablet by mouth 3 (three) times daily.   torsemide  (DEMADEX ) 20 MG tablet Take 1 tablet (20 mg total) by mouth 2 (two) times daily unless worse swelling take  40 mg in the morning and 20 mg in the afternoon   Zinc 50 MG TABS Take 50 mg by mouth in the morning.   "

## 2024-05-09 NOTE — Patient Instructions (Signed)
 Medication Instructions:  Your physician has recommended you make the following change in your medication:   Increase your Torsemide  60 mg (1.5 tablet) in the am and 40 mg (1 tablet) in the afternoon.  Stop Amlodipine   *If you need a refill on your cardiac medications before your next appointment, please call your pharmacy*   Lab Work: Your physician recommends that you return for lab work in: next week for fasting lipids, AST, ALT, ProBNP and BMP. You need to have labs done when you are fasting.  You can come Monday through Friday 8:30 am to 12:00 pm and 1:15 to 4:30. You do not need to make an appointment as the order has already been placed.   If you have labs (blood work) drawn today and your tests are completely normal, you will receive your results only by: MyChart Message (if you have MyChart) OR A paper copy in the mail If you have any lab test that is abnormal or we need to change your treatment, we will call you to review the results.   Testing/Procedures: None ordered   Follow-Up: At [facility], you and your health needs are our priority.  As part of our continuing mission to provide you with exceptional heart care, we have created designated Provider Care Teams.  These Care Teams include your primary Cardiologist (physician) and Advanced Practice Providers (APPs -  Physician Assistants and Nurse Practitioners) who all work together to provide you with the care you need, when you need it.  We recommend signing up for the patient portal called MyChart.  Sign up information is provided on this After Visit Summary.  MyChart is used to connect with patients for Virtual Visits (Telemedicine).  Patients are able to view lab/test results, encounter notes, upcoming appointments, etc.  Non-urgent messages can be sent to your provider as well.   To learn more about what you can do with MyChart, go to [email or web address].    Your next appointment:   2 month(s)  The  format for your next appointment:   In Person  Provider:   Lamar Fitch, MD    Other Instructions none  Important Information About Sugar

## 2024-05-16 ENCOUNTER — Other Ambulatory Visit (HOSPITAL_BASED_OUTPATIENT_CLINIC_OR_DEPARTMENT_OTHER): Payer: Self-pay

## 2024-05-17 LAB — BASIC METABOLIC PANEL WITH GFR
BUN/Creatinine Ratio: 19 (ref 12–28)
BUN: 25 mg/dL (ref 8–27)
CO2: 31 mmol/L — ABNORMAL HIGH (ref 20–29)
Calcium: 9.9 mg/dL (ref 8.7–10.3)
Chloride: 98 mmol/L (ref 96–106)
Creatinine, Ser: 1.32 mg/dL — ABNORMAL HIGH (ref 0.57–1.00)
Glucose: 115 mg/dL — ABNORMAL HIGH (ref 70–99)
Potassium: 4.6 mmol/L (ref 3.5–5.2)
Sodium: 144 mmol/L (ref 134–144)
eGFR: 42 mL/min/1.73 — ABNORMAL LOW

## 2024-05-17 LAB — LIPID PANEL
Chol/HDL Ratio: 3.6 ratio (ref 0.0–4.4)
Cholesterol, Total: 129 mg/dL (ref 100–199)
HDL: 36 mg/dL — ABNORMAL LOW
LDL Chol Calc (NIH): 60 mg/dL (ref 0–99)
Triglycerides: 197 mg/dL — ABNORMAL HIGH (ref 0–149)
VLDL Cholesterol Cal: 33 mg/dL (ref 5–40)

## 2024-05-17 LAB — PRO B NATRIURETIC PEPTIDE: NT-Pro BNP: 963 pg/mL — ABNORMAL HIGH (ref 0–738)

## 2024-05-17 LAB — AST: AST: 13 IU/L (ref 0–40)

## 2024-05-17 LAB — ALT: ALT: 15 IU/L (ref 0–32)

## 2024-05-19 ENCOUNTER — Other Ambulatory Visit (HOSPITAL_BASED_OUTPATIENT_CLINIC_OR_DEPARTMENT_OTHER): Payer: Self-pay

## 2024-05-19 ENCOUNTER — Ambulatory Visit: Payer: Self-pay | Admitting: Cardiology

## 2024-05-19 MED ORDER — ELIQUIS 5 MG PO TABS
5.0000 mg | ORAL_TABLET | Freq: Two times a day (BID) | ORAL | 1 refills | Status: AC
  Filled 2024-05-19: qty 180, 90d supply, fill #0
  Filled 2024-05-19: qty 60, 30d supply, fill #0
  Filled 2024-05-19: qty 180, 90d supply, fill #0

## 2024-05-20 ENCOUNTER — Telehealth: Payer: Self-pay

## 2024-05-20 NOTE — Telephone Encounter (Signed)
 Left message on My Chart with lab results per Dr. Karry note. Routed to PCP

## 2024-05-20 NOTE — Telephone Encounter (Signed)
 Patient read MyChart message regarding recent lab results on 05/20/24.  Alan, RN

## 2024-05-27 ENCOUNTER — Other Ambulatory Visit (HOSPITAL_BASED_OUTPATIENT_CLINIC_OR_DEPARTMENT_OTHER): Payer: Self-pay

## 2024-05-27 MED ORDER — HYDRALAZINE HCL 25 MG PO TABS
25.0000 mg | ORAL_TABLET | Freq: Two times a day (BID) | ORAL | 1 refills | Status: AC
  Filled 2024-05-27: qty 180, 90d supply, fill #0

## 2024-05-27 MED ORDER — ROSUVASTATIN CALCIUM 20 MG PO TABS
20.0000 mg | ORAL_TABLET | Freq: Every day | ORAL | 1 refills | Status: AC
  Filled 2024-05-27: qty 90, 90d supply, fill #0

## 2024-06-02 NOTE — Progress Notes (Signed)
 Candice Perez                                          MRN: [id number]   06/02/2024   The [facility] Quality Team Specialist reviewed this patient medical record for the purposes of chart review for care gap closure. The following were reviewed: chart review for care gap closure-diabetic eye exam.    [facility] Quality Team

## 2024-06-17 ENCOUNTER — Other Ambulatory Visit (HOSPITAL_BASED_OUTPATIENT_CLINIC_OR_DEPARTMENT_OTHER): Payer: Self-pay

## 2024-06-17 MED ORDER — LEVOTHYROXINE SODIUM 125 MCG PO TABS
125.0000 ug | ORAL_TABLET | Freq: Every day | ORAL | 1 refills | Status: AC
  Filled 2024-06-17: qty 90, 90d supply, fill #0

## 2024-06-17 MED ORDER — POTASSIUM CHLORIDE ER 20 MEQ PO TBCR
20.0000 meq | EXTENDED_RELEASE_TABLET | Freq: Every day | ORAL | 0 refills | Status: AC
  Filled 2024-06-17: qty 90, 90d supply, fill #0

## 2024-06-18 ENCOUNTER — Other Ambulatory Visit (HOSPITAL_BASED_OUTPATIENT_CLINIC_OR_DEPARTMENT_OTHER): Payer: Self-pay

## 2024-06-20 MED ORDER — LEVOTHYROXINE SODIUM 125 MCG PO TABS
125.0000 ug | ORAL_TABLET | Freq: Every morning | ORAL | 3 refills | Status: AC
  Filled 2024-06-20: qty 90, 90d supply, fill #0

## 2024-07-25 ENCOUNTER — Ambulatory Visit: Admitting: Cardiology

## 2024-07-28 ENCOUNTER — Ambulatory Visit: Admitting: Hematology and Oncology
